# Patient Record
Sex: Female | Born: 1935 | Race: White | Hispanic: No | Smoking: Never smoker
Health system: Southern US, Community
[De-identification: ages and names within clinical notes are randomized; demographics above are authoritative.]

## PROBLEM LIST (undated history)

## (undated) DIAGNOSIS — G629 Polyneuropathy, unspecified: Secondary | ICD-10-CM

## (undated) DIAGNOSIS — R42 Dizziness and giddiness: Secondary | ICD-10-CM

## (undated) DIAGNOSIS — F32A Depression, unspecified: Secondary | ICD-10-CM

## (undated) DIAGNOSIS — E119 Type 2 diabetes mellitus without complications: Secondary | ICD-10-CM

## (undated) DIAGNOSIS — S72009A Fracture of unspecified part of neck of unspecified femur, initial encounter for closed fracture: Secondary | ICD-10-CM

## (undated) DIAGNOSIS — I10 Essential (primary) hypertension: Secondary | ICD-10-CM

## (undated) DIAGNOSIS — E785 Hyperlipidemia, unspecified: Secondary | ICD-10-CM

## (undated) DIAGNOSIS — J449 Chronic obstructive pulmonary disease, unspecified: Secondary | ICD-10-CM

## (undated) DIAGNOSIS — F329 Major depressive disorder, single episode, unspecified: Secondary | ICD-10-CM

## (undated) DIAGNOSIS — I509 Heart failure, unspecified: Secondary | ICD-10-CM

## (undated) DIAGNOSIS — K219 Gastro-esophageal reflux disease without esophagitis: Secondary | ICD-10-CM

## (undated) DIAGNOSIS — F419 Anxiety disorder, unspecified: Secondary | ICD-10-CM

## (undated) DIAGNOSIS — F0390 Unspecified dementia without behavioral disturbance: Secondary | ICD-10-CM

## (undated) HISTORY — DX: Hyperlipidemia, unspecified: E78.5

## (undated) HISTORY — DX: Depression, unspecified: F32.A

## (undated) HISTORY — DX: Fracture of unspecified part of neck of unspecified femur, initial encounter for closed fracture: S72.009A

## (undated) HISTORY — PX: ABDOMINAL HYSTERECTOMY: SHX81

## (undated) HISTORY — DX: Anxiety disorder, unspecified: F41.9

## (undated) HISTORY — DX: Essential (primary) hypertension: I10

## (undated) HISTORY — DX: Polyneuropathy, unspecified: G62.9

## (undated) HISTORY — PX: APPENDECTOMY: SHX54

## (undated) HISTORY — DX: Dizziness and giddiness: R42

## (undated) HISTORY — DX: Gastro-esophageal reflux disease without esophagitis: K21.9

## (undated) HISTORY — DX: Heart failure, unspecified: I50.9

## (undated) HISTORY — PX: BACK SURGERY: SHX140

## (undated) HISTORY — DX: Major depressive disorder, single episode, unspecified: F32.9

## (undated) HISTORY — PX: CHOLECYSTECTOMY: SHX55

---

## 2005-08-19 ENCOUNTER — Ambulatory Visit: Payer: Self-pay

## 2005-08-31 ENCOUNTER — Ambulatory Visit: Payer: Self-pay

## 2006-10-28 ENCOUNTER — Ambulatory Visit: Payer: Self-pay

## 2007-08-08 ENCOUNTER — Emergency Department: Payer: Self-pay | Admitting: Emergency Medicine

## 2007-08-16 ENCOUNTER — Emergency Department: Payer: Self-pay | Admitting: Emergency Medicine

## 2008-02-25 ENCOUNTER — Emergency Department: Payer: Self-pay | Admitting: Emergency Medicine

## 2008-02-25 ENCOUNTER — Other Ambulatory Visit: Payer: Self-pay

## 2008-02-26 ENCOUNTER — Emergency Department: Payer: Self-pay | Admitting: Emergency Medicine

## 2010-09-30 ENCOUNTER — Encounter: Admission: RE | Admit: 2010-09-30 | Discharge: 2010-09-30 | Payer: Self-pay | Admitting: Family Medicine

## 2010-10-13 ENCOUNTER — Ambulatory Visit (HOSPITAL_COMMUNITY)
Admission: RE | Admit: 2010-10-13 | Discharge: 2010-10-13 | Payer: Self-pay | Source: Home / Self Care | Admitting: Family Medicine

## 2010-10-27 ENCOUNTER — Encounter
Admission: RE | Admit: 2010-10-27 | Discharge: 2010-10-27 | Payer: Self-pay | Source: Home / Self Care | Attending: Family Medicine | Admitting: Family Medicine

## 2010-11-18 ENCOUNTER — Encounter
Admission: RE | Admit: 2010-11-18 | Discharge: 2010-11-18 | Payer: Self-pay | Source: Home / Self Care | Attending: Family Medicine | Admitting: Family Medicine

## 2011-07-06 ENCOUNTER — Telehealth: Payer: Self-pay | Admitting: Family Medicine

## 2011-07-21 NOTE — Telephone Encounter (Signed)
Error

## 2012-02-24 ENCOUNTER — Other Ambulatory Visit: Payer: Self-pay | Admitting: Family Medicine

## 2012-02-24 ENCOUNTER — Ambulatory Visit
Admission: RE | Admit: 2012-02-24 | Discharge: 2012-02-24 | Disposition: A | Discharge status: Home / Self Care | Payer: No Typology Code available for payment source | Source: Ambulatory Visit | Attending: Family Medicine | Admitting: Family Medicine

## 2012-02-24 DIAGNOSIS — R0600 Dyspnea, unspecified: Secondary | ICD-10-CM

## 2012-02-24 DIAGNOSIS — R0602 Shortness of breath: Secondary | ICD-10-CM

## 2012-07-15 LAB — URINALYSIS, COMPLETE
Bilirubin,UR: NEGATIVE
Glucose,UR: NEGATIVE mg/dL (ref 0–75)
Hyaline Cast: 9
Ketone: NEGATIVE
Nitrite: NEGATIVE
Ph: 5 (ref 4.5–8.0)
Specific Gravity: 1.028 (ref 1.003–1.030)
Squamous Epithelial: 2

## 2012-07-15 LAB — COMPREHENSIVE METABOLIC PANEL
Alkaline Phosphatase: 91 U/L (ref 50–136)
Calcium, Total: 9.5 mg/dL (ref 8.5–10.1)
Co2: 29 mmol/L (ref 21–32)
EGFR (Non-African Amer.): >60
Osmolality: 284 (ref 275–301)
SGPT (ALT): 20 U/L (ref 12–78)
Sodium: 141 mmol/L (ref 136–145)

## 2012-07-15 LAB — PRO B NATRIURETIC PEPTIDE: B-Type Natriuretic Peptide: 309 pg/mL (ref 0–450)

## 2012-07-15 LAB — CBC WITH DIFFERENTIAL/PLATELET
Eosinophil #: 0.3 10*3/uL (ref 0.0–0.7)
Eosinophil %: 2.9 %
HCT: 37 % (ref 35.0–47.0)
HGB: 12 g/dL (ref 12.0–16.0)
MCHC: 32.4 g/dL (ref 32.0–36.0)
Monocyte #: 0.8 x10 3/mm (ref 0.2–0.9)
Neutrophil %: 58 %
RBC: 4.84 10*6/uL (ref 3.80–5.20)
WBC: 10.4 10*3/uL (ref 3.6–11.0)

## 2012-07-15 LAB — TROPONIN I: Troponin-I: <0.02 ng/mL

## 2012-07-16 ENCOUNTER — Inpatient Hospital Stay: Payer: Self-pay | Admitting: Internal Medicine

## 2012-07-17 LAB — BASIC METABOLIC PANEL
Anion Gap: 5 — ABNORMAL LOW (ref 7–16)
Calcium, Total: 8.8 mg/dL (ref 8.5–10.1)
Co2: 26 mmol/L (ref 21–32)
Creatinine: 0.75 mg/dL (ref 0.60–1.30)
EGFR (African American): >60
EGFR (Non-African Amer.): >60
Potassium: 4.3 mmol/L (ref 3.5–5.1)
Sodium: 141 mmol/L (ref 136–145)

## 2012-07-17 LAB — MAGNESIUM: Magnesium: 1.7 mg/dL — ABNORMAL LOW

## 2012-07-17 LAB — URINE CULTURE

## 2012-07-18 LAB — STOOL CULTURE

## 2012-07-30 ENCOUNTER — Emergency Department: Payer: Self-pay | Admitting: Emergency Medicine

## 2012-07-31 LAB — COMPREHENSIVE METABOLIC PANEL
Alkaline Phosphatase: 95 U/L (ref 50–136)
BUN: 8 mg/dL (ref 7–18)
Bilirubin,Total: 0.3 mg/dL (ref 0.2–1.0)
Calcium, Total: 9.5 mg/dL (ref 8.5–10.1)
Chloride: 102 mmol/L (ref 98–107)
Co2: 31 mmol/L (ref 21–32)
EGFR (African American): >60
EGFR (Non-African Amer.): >60
Glucose: 148 mg/dL — ABNORMAL HIGH (ref 65–99)
SGOT(AST): 29 U/L (ref 15–37)
SGPT (ALT): 18 U/L (ref 12–78)

## 2012-07-31 LAB — CBC WITH DIFFERENTIAL/PLATELET
Comment - H1-Com1: NORMAL
Comment - H1-Com2: NORMAL
Eosinophil: 4 %
HCT: 38.1 %
HGB: 12.3 g/dL
Lymphocytes: 35 %
MCH: 24.7 pg — ABNORMAL LOW
MCHC: 32.4 g/dL
MCV: 76 fL — ABNORMAL LOW
Monocytes: 7 %
Platelet: 312 x10 3/mm 3
RBC: 4.99 X10 6/mm 3
RDW: 15.2 % — ABNORMAL HIGH
Segmented Neutrophils: 51 %
Variant Lymphocyte - H1-Rlymph: 3 %
WBC: 8.4 x10 3/mm 3

## 2012-07-31 LAB — URINALYSIS, COMPLETE
Bilirubin,UR: NEGATIVE
Ketone: NEGATIVE
Ph: 5 (ref 4.5–8.0)
Protein: 30
Specific Gravity: 1.025 (ref 1.003–1.030)
Squamous Epithelial: 3
WBC UR: 39 /HPF (ref 0–5)

## 2012-07-31 LAB — MAGNESIUM: Magnesium: 1.8 mg/dL

## 2012-07-31 LAB — PHOSPHORUS: Phosphorus: 2.9 mg/dL

## 2012-08-01 LAB — URINE CULTURE

## 2013-06-19 ENCOUNTER — Emergency Department: Payer: Self-pay | Admitting: Emergency Medicine

## 2013-06-19 LAB — CBC
HGB: 12.3 g/dL (ref 12.0–16.0)
MCH: 23.8 pg — ABNORMAL LOW (ref 26.0–34.0)
MCHC: 33 g/dL (ref 32.0–36.0)
RDW: 17.3 % — ABNORMAL HIGH (ref 11.5–14.5)

## 2013-06-19 LAB — URINALYSIS, COMPLETE
Bilirubin,UR: NEGATIVE
Glucose,UR: NEGATIVE mg/dL (ref 0–75)
Hyaline Cast: 81
Hyaline Cast: 3
Nitrite: NEGATIVE
Ph: 5 (ref 4.5–8.0)
Ph: 5 (ref 4.5–8.0)
Protein: 100
RBC,UR: 125 /HPF (ref 0–5)
Specific Gravity: 1.029 (ref 1.003–1.030)
Specific Gravity: 1.02 (ref 1.003–1.030)
Squamous Epithelial: 3
WBC UR: <1 /HPF (ref 0–5)

## 2013-06-19 LAB — COMPREHENSIVE METABOLIC PANEL
Bilirubin,Total: 0.5 mg/dL (ref 0.2–1.0)
EGFR (African American): >60
Osmolality: 276 (ref 275–301)
Total Protein: 7.6 g/dL (ref 6.4–8.2)

## 2013-08-11 ENCOUNTER — Emergency Department: Payer: Self-pay | Admitting: Emergency Medicine

## 2013-08-11 LAB — CBC
MCHC: 31.9 g/dL — ABNORMAL LOW (ref 32.0–36.0)
MCV: 74 fL — ABNORMAL LOW (ref 80–100)
Platelet: 340 10*3/uL (ref 150–440)
RBC: 4.77 10*6/uL (ref 3.80–5.20)
WBC: 12 10*3/uL — ABNORMAL HIGH (ref 3.6–11.0)

## 2013-08-11 LAB — URINALYSIS, COMPLETE
Nitrite: NEGATIVE
Ph: 5 (ref 4.5–8.0)
Specific Gravity: 1.024 (ref 1.003–1.030)
Squamous Epithelial: 4
WBC UR: 6 /HPF (ref 0–5)

## 2013-08-11 LAB — COMPREHENSIVE METABOLIC PANEL
Anion Gap: 8 (ref 7–16)
BUN: 30 mg/dL — ABNORMAL HIGH (ref 7–18)
Calcium, Total: 8.7 mg/dL (ref 8.5–10.1)
Chloride: 109 mmol/L — ABNORMAL HIGH (ref 98–107)
Co2: 23 mmol/L (ref 21–32)
Potassium: 3.7 mmol/L (ref 3.5–5.1)
SGOT(AST): 22 U/L (ref 15–37)
Total Protein: 7.4 g/dL (ref 6.4–8.2)

## 2013-08-11 LAB — CK TOTAL AND CKMB (NOT AT ARMC)
CK, Total: 52 U/L (ref 21–215)
CK-MB: 1.5 ng/mL (ref 0.5–3.6)

## 2013-08-12 LAB — URINE CULTURE

## 2013-09-06 IMAGING — CT CT HEAD WITHOUT CONTRAST
1 series · 15 of 30 positions shown, 19 images · non-contrast
Comparison: none

REASON FOR EXAM: CONFUSION
COMMENTS:

[Series 2: soft tissue · axial · 0.43mm/px · z∈[-248,-104]mm · 15 of 33 slices shown, 19 images]
[im 2/33  brain]
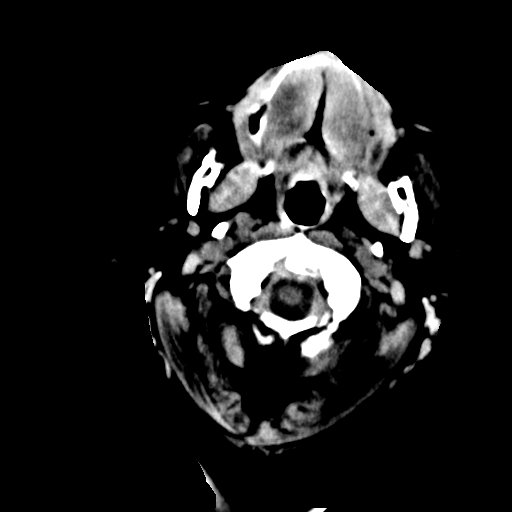
[im 2/33  bone]
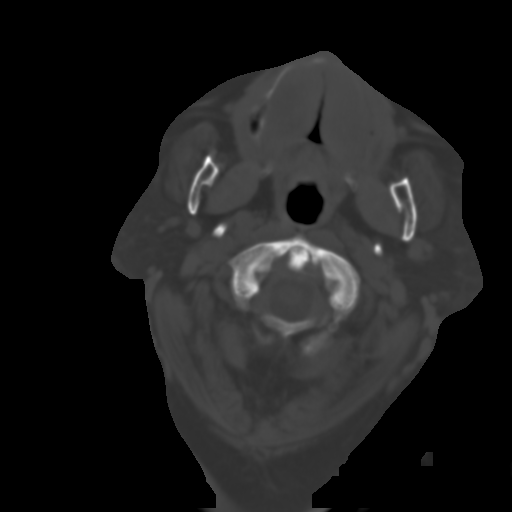
[im 4/33  brain]
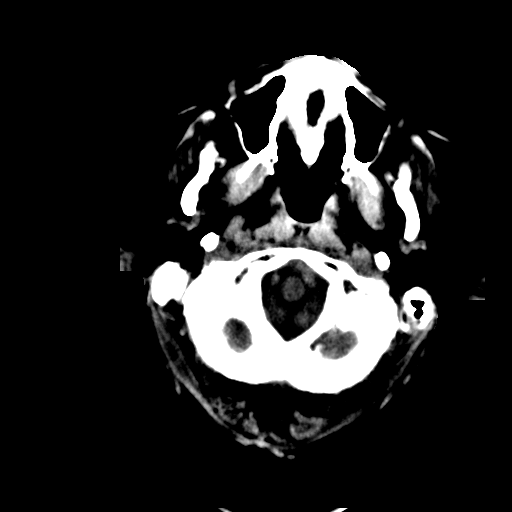
[im 6/33  brain]
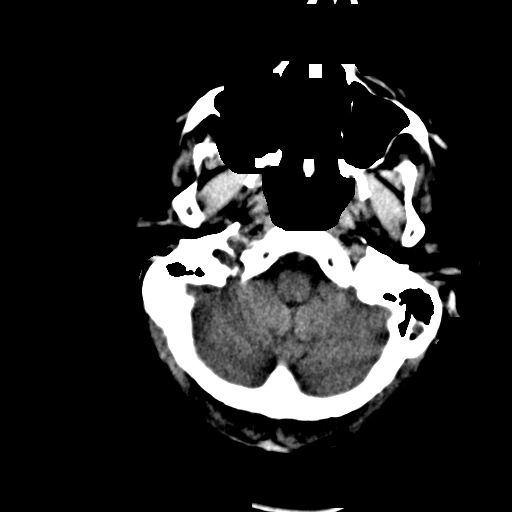
[im 8/33  brain]
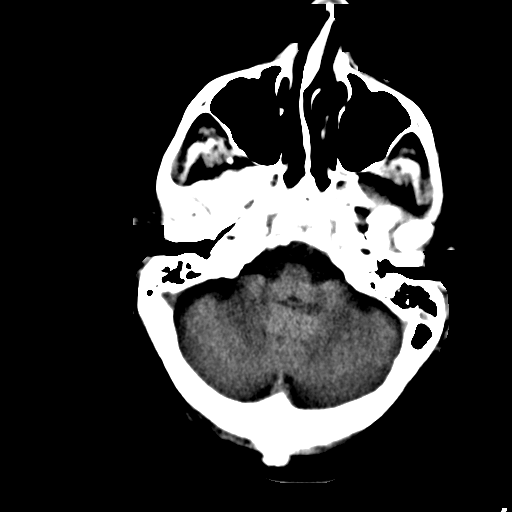
[im 10/33  brain]
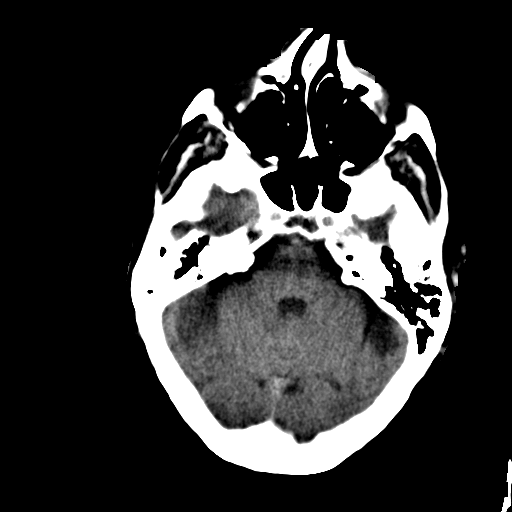
[im 10/33  bone]
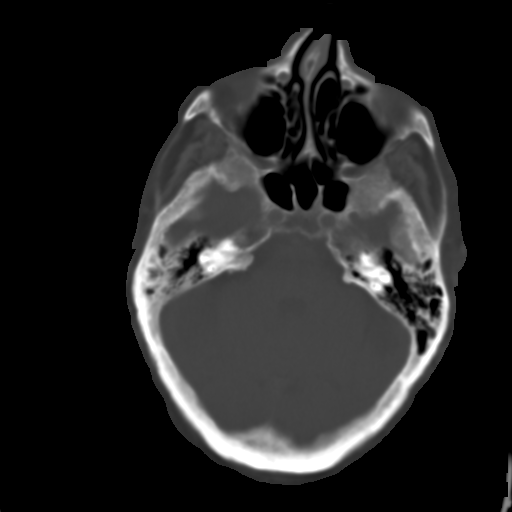
[im 13/33  brain]
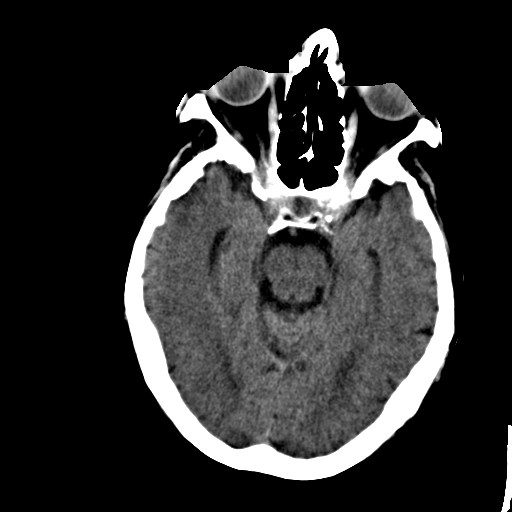
[im 15/33  brain]
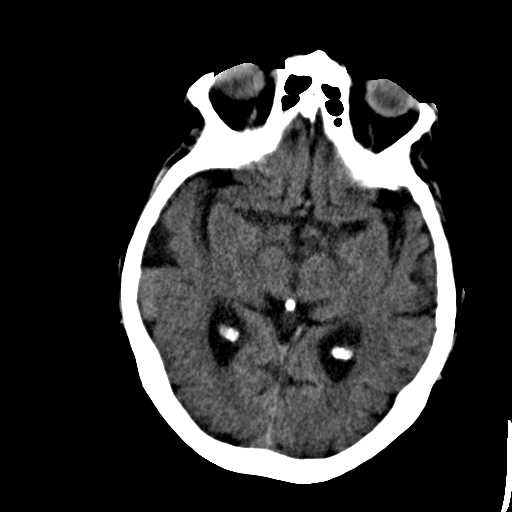
[im 17/33  brain]
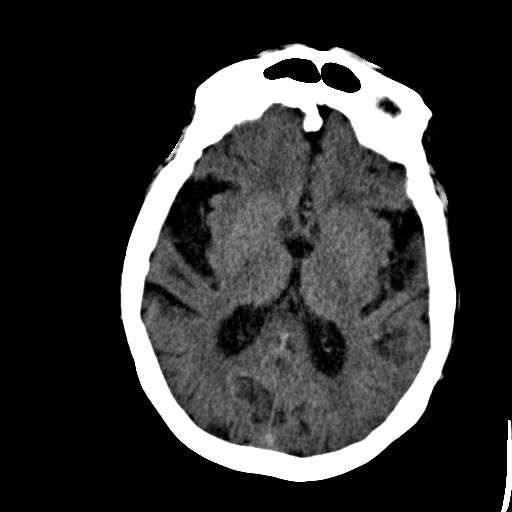
[im 18/33  brain]
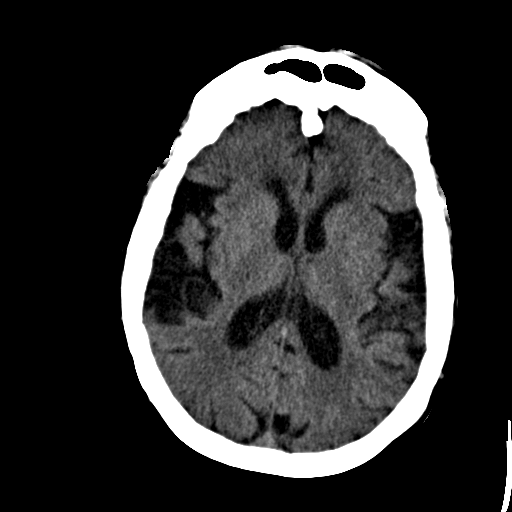
[im 18/33  bone]
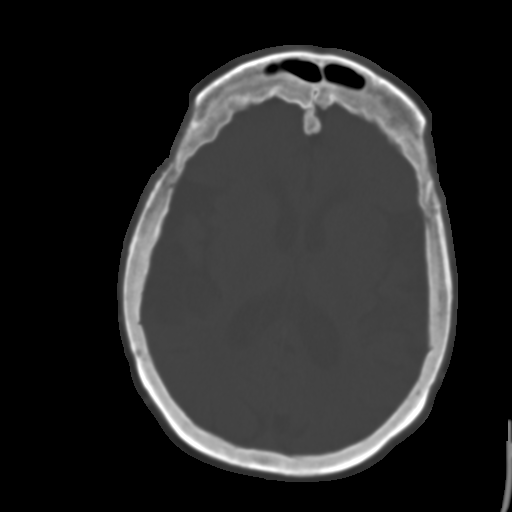
[im 20/33  brain]
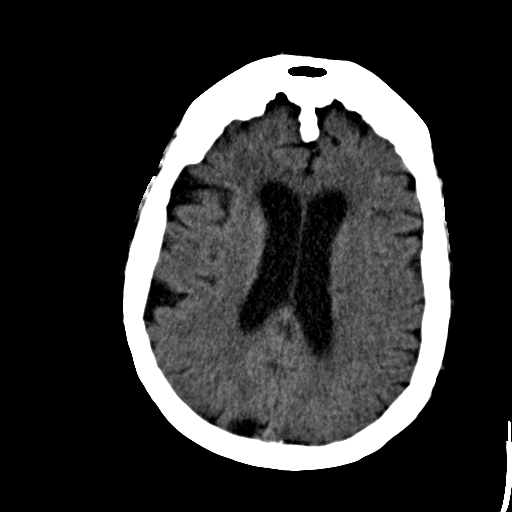
[im 23/33  brain]
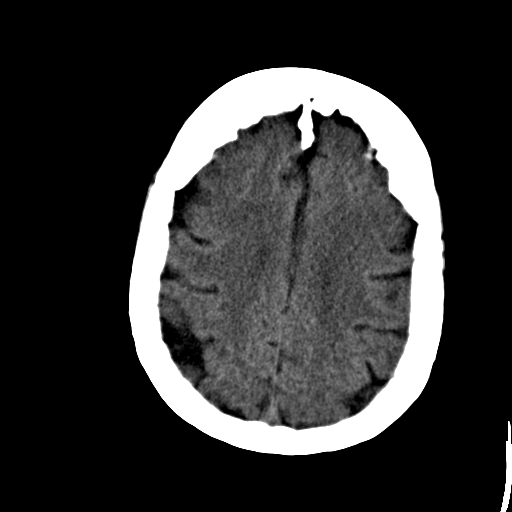
[im 25/33  brain]
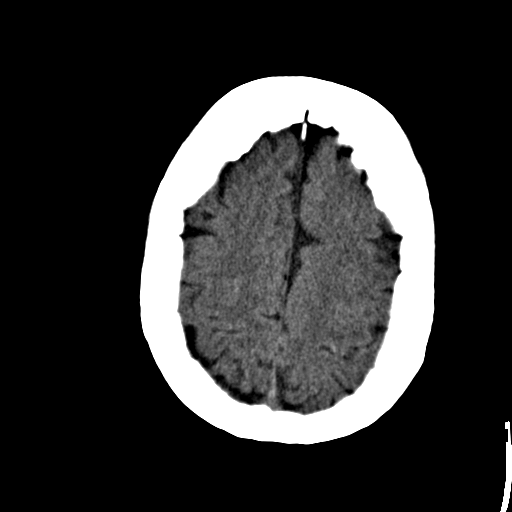
[im 27/33  brain]
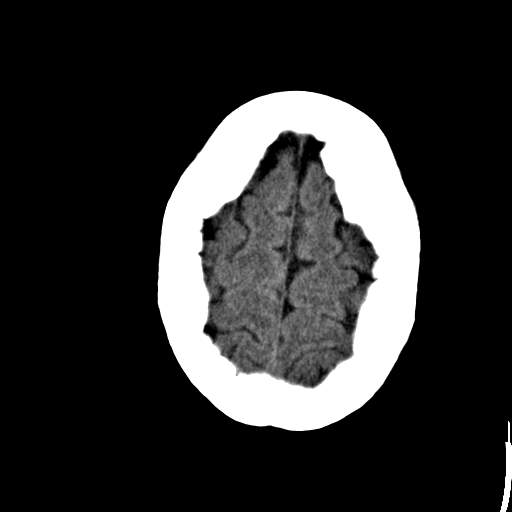
[im 27/33  bone]
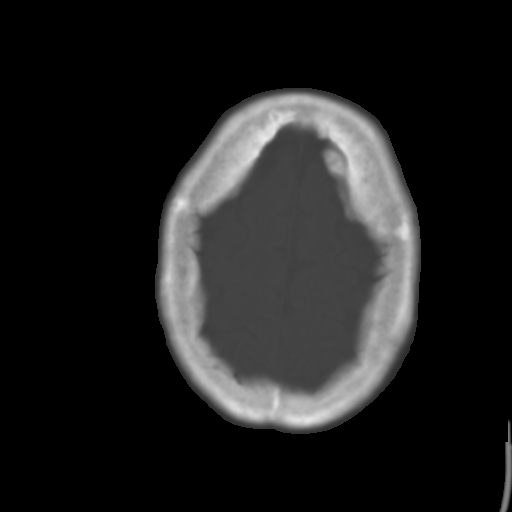
[im 29/33  brain]
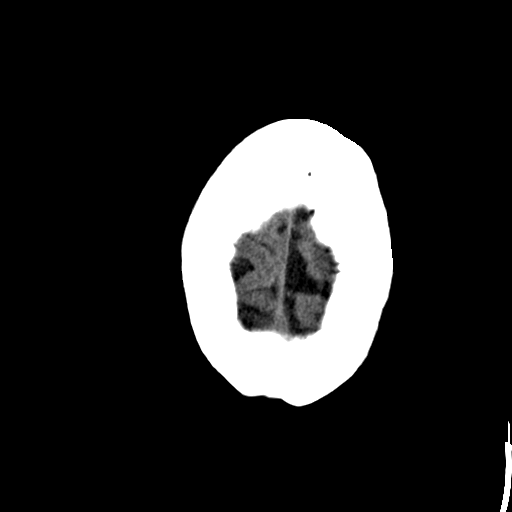
[im 31/33  brain]
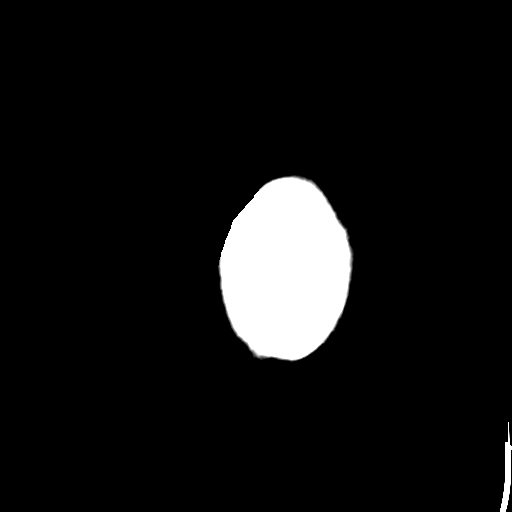

[15 of 30 positions shown; findings below may reference images not displayed]

PROCEDURE:     CT  - CT HEAD WITHOUT CONTRAST  - June 19, 2013  [DATE]

RESULT:     Noncontrast CT of the brain is compared to the previous study of
07/30/2012.

There is prominence of the ventricles and sulci consistent with atrophy.
There is no evidence of intracranial hemorrhage, mass, mass effect or
evolving infarct. There is scattered low-attenuation in the periventricular
and subcortical white matter regions consistent with chronic microvascular
ischemic disease. The included sinuses show grossly normal aeration. The
calvarium shows no depressed skull fracture. There is fluid in the left
mastoid tip and posterior portion of the left mastoid sinus. There is some
sclerosis in the right mastoid region.
IMPRESSION: 1. Fluid in the left mastoid region and sclerosis in the right mastoid
region. This is unchanged compare to the previous study. Changes of atrophy
with chronic microvascular ischemic disease appear present. No acute
intracranial abnormality or significant interval change evident.

[REDACTED]

## 2014-06-17 ENCOUNTER — Observation Stay: Payer: Self-pay | Admitting: Internal Medicine

## 2014-06-17 LAB — COMPREHENSIVE METABOLIC PANEL
ALBUMIN: 3.3 g/dL — AB (ref 3.4–5.0)
ALK PHOS: 109 U/L
ALT: 18 U/L
ANION GAP: 7 (ref 7–16)
BUN: 12 mg/dL (ref 7–18)
Bilirubin,Total: 0.4 mg/dL (ref 0.2–1.0)
CALCIUM: 8.7 mg/dL (ref 8.5–10.1)
CHLORIDE: 109 mmol/L — AB (ref 98–107)
Co2: 26 mmol/L (ref 21–32)
Creatinine: 0.82 mg/dL (ref 0.60–1.30)
EGFR (African American): >60
EGFR (Non-African Amer.): >60
GLUCOSE: 140 mg/dL — AB (ref 65–99)
OSMOLALITY: 285 (ref 275–301)
Potassium: 3.5 mmol/L (ref 3.5–5.1)
SGOT(AST): 14 U/L — ABNORMAL LOW (ref 15–37)
Sodium: 142 mmol/L (ref 136–145)
Total Protein: 7.3 g/dL (ref 6.4–8.2)

## 2014-06-17 LAB — URINALYSIS, COMPLETE
BILIRUBIN, UR: NEGATIVE
Blood: NEGATIVE
Glucose,UR: NEGATIVE mg/dL (ref 0–75)
Leukocyte Esterase: NEGATIVE
NITRITE: NEGATIVE
PH: 5 (ref 4.5–8.0)
SPECIFIC GRAVITY: 1.038 (ref 1.003–1.030)
Squamous Epithelial: 1

## 2014-06-17 LAB — CBC
HCT: 37.1 % (ref 35.0–47.0)
HGB: 11.6 g/dL — ABNORMAL LOW (ref 12.0–16.0)
MCH: 23.8 pg — ABNORMAL LOW (ref 26.0–34.0)
MCHC: 31.3 g/dL — ABNORMAL LOW (ref 32.0–36.0)
MCV: 76 fL — ABNORMAL LOW (ref 80–100)
Platelet: 285 10*3/uL (ref 150–440)
RBC: 4.88 10*6/uL (ref 3.80–5.20)
RDW: 15.2 % — AB (ref 11.5–14.5)
WBC: 9 10*3/uL (ref 3.6–11.0)

## 2014-06-17 LAB — TROPONIN I

## 2014-06-17 LAB — CK TOTAL AND CKMB (NOT AT ARMC)
CK, TOTAL: 142 U/L
CK-MB: 2.5 ng/mL (ref 0.5–3.6)

## 2014-06-17 LAB — PRO B NATRIURETIC PEPTIDE: B-Type Natriuretic Peptide: 655 pg/mL — ABNORMAL HIGH (ref 0–450)

## 2014-06-18 LAB — BASIC METABOLIC PANEL
Anion Gap: 9 (ref 7–16)
BUN: 11 mg/dL (ref 7–18)
CALCIUM: 8.3 mg/dL — AB (ref 8.5–10.1)
CREATININE: 0.74 mg/dL (ref 0.60–1.30)
Chloride: 110 mmol/L — ABNORMAL HIGH (ref 98–107)
Co2: 24 mmol/L (ref 21–32)
EGFR (African American): >60
EGFR (Non-African Amer.): >60
GLUCOSE: 157 mg/dL — AB (ref 65–99)
Osmolality: 288 (ref 275–301)
POTASSIUM: 3.2 mmol/L — AB (ref 3.5–5.1)
SODIUM: 143 mmol/L (ref 136–145)

## 2014-06-18 LAB — CBC WITH DIFFERENTIAL/PLATELET
BASOS ABS: 0 10*3/uL (ref 0.0–0.1)
Basophil %: 0.5 %
EOS PCT: 3.7 %
Eosinophil #: 0.3 10*3/uL (ref 0.0–0.7)
HCT: 36.4 % (ref 35.0–47.0)
HGB: 11.4 g/dL — ABNORMAL LOW (ref 12.0–16.0)
Lymphocyte #: 2.1 10*3/uL (ref 1.0–3.6)
Lymphocyte %: 28.2 %
MCH: 23.7 pg — AB (ref 26.0–34.0)
MCHC: 31.2 g/dL — ABNORMAL LOW (ref 32.0–36.0)
MCV: 76 fL — ABNORMAL LOW (ref 80–100)
Monocyte #: 0.5 x10 3/mm (ref 0.2–0.9)
Monocyte %: 7 %
Neutrophil #: 4.6 10*3/uL (ref 1.4–6.5)
Neutrophil %: 60.6 %
Platelet: 251 10*3/uL (ref 150–440)
RBC: 4.78 10*6/uL (ref 3.80–5.20)
RDW: 15.2 % — ABNORMAL HIGH (ref 11.5–14.5)
WBC: 7.5 10*3/uL (ref 3.6–11.0)

## 2015-01-12 ENCOUNTER — Emergency Department: Payer: Self-pay | Admitting: Emergency Medicine

## 2015-02-25 NOTE — Discharge Summary (Signed)
PATIENT NAME:  [name], [name] MR#:  [id number] DATE OF BIRTH:  12/19/1931  DATE OF ADMISSION:  07/16/2012 DATE OF DISCHARGE:  07/17/2012  PRIMARY CARE PHYSICIAN:  Dr. Vear ClockPhillips.   FINAL DIAGNOSES:  1. Diarrhea, possibly viral.  2. Syncope.  3. Cystitis.  4. Hypokalemia.  5. Hypomagnesemia.  6. Diabetes.  7. Hyperlipidemia.  8. Buttock pain with fall.   MEDICATIONS ON DISCHARGE:  1. Phenergan 25 mg orally as needed for nausea.  2. Tramadol 50 mg every eight hours as needed for pain.  3. Cymbalta 30 mg twice a day. 4. Caduet 5/10, one tablet daily.  5. Metformin 500 mg twice a day. Start after diarrhea stops. 6. Clonazepam 0.5 mg every 12 hours as needed for anxiety.  7. Benazepril 5 mg daily at bedtime.  8. Potassium chloride 10 mEq daily for three more days.  9. Cipro 500 mg 1 tablet every 12 hours for two days.  10. Magnesium oxide 400 mg 1 tablet orally once a day for one week.   DIET: Low sodium diet, regular consistency.   ACTIVITY: Activity as tolerated.   FOLLOW-UP: Dr. Vear ClockPhillips in one week.   REASON FOR ADMISSION: The patient was admitted 07/16/2012, discharged 07/17/2012. The patient came in with two falls and generalized weakness.   HISTORY OF PRESENT ILLNESS: This is a 81 year old female who lives home alone. She fell at home twice. She feels fatigued. She is found to have hypokalemia and positive urinalysis. She complains of buttock pain from the fall. She also complains of diarrhea each time she fell. The patient was admitted for IV fluid hydration, started on Cipro for possible urinary infection.   LABORATORY, DIAGNOSTIC AND RADIOLOGICAL DATA: EKG showed sinus rhythm, frequent premature ventricular complexes. Urine culture actually was negative in 18 to 24 hours, but urinalysis showed 3+ leukocyte esterase, trace bacteria. BNP 309, CPK 77, TSH 1.24. Troponin negative. Glucose 137, BUN 14, creatinine 0.88, sodium 141, potassium 3.1, chloride 105, CO2 29, calcium 9.5.  Liver function tests normal. White blood cell count 10.4, hemoglobin and hematocrit 12.0 and 37.0, platelet count 314. Sacrum and coccyx x-ray: No evidence of sacral fracture. Pelvis x-ray showed no abnormality in the pelvis. Chest x-ray: Stable scarring of the right lung base. CT scan of the head: Moderate diffuse cerebral and cerebellar atrophy with compensatory ventriculomegaly. No acute evidence of abnormality. CT scan of the cervical spine showed no fracture or dislocation. Enlargement of the left thyroid lobe exhibits a multinodular appearance. Stool culture negative. C. difficile negative. White blood cells in the stool negative. Magnesium added by me was 1.3. Repeat magnesium upon discharge 1.7. Repeat potassium upon discharge 4.3.   HOSPITAL COURSE PER PROBLEM LIST:  1. Diarrhea, most likely viral cause. This had subsided during the hospital stay.  2. Syncope, most likely secondary to diarrhea and dehydration. The patient was given IV fluid hydration and electrolytes were replaced.  3. Cystitis. The patient was given Cipro. Even though urine culture was negative, I did continue for a three-day course.  4. For the hypokalemia, this was replaced IV and orally upon discharge given a small dose. I believe this was secondary to the Lasix which the patient was taking as an outpatient.  5. For the hypomagnesemia, magnesium rubs were placed IV twice during the hospitalization and orally and orally upon discharge. If the patient does not go back on the Lasix, then the electrolytes can probably be stopped as outpatient.  6. Diabetes. The patient is on metformin. I told  her to hold this if she has diarrhea.  7. Hyperlipidemia. She is on the Caduet.  8. Buttock pain with falls. She is on Tramadol. The patient also did walk well with physical therapy 350 feet. The patient does have a walker at home and felt safe by physical therapy to go home.    TIME SPENT ON DISCHARGE: 40 minutes.    ____________________________ Herschell Dimes. Renae Gloss, MD rjw:ap D: 07/17/2012 15:50:55 ET T: 07/18/2012 13:34:23 ET JOB#: [id number]  cc: Herschell Dimes. Renae Gloss, MD, <Dictator> Marcine Matar., MD Salley Scarlet MD ELECTRONICALLY SIGNED 07/18/2012 16:28

## 2015-02-25 NOTE — H&P (Signed)
PATIENT NAME:  [name], Genae N MR#:  [id number] DATE OF BIRTH:  1932-01-06  DATE OF ADMISSION:  07/16/2012  PRIMARY CARE PHYSICIAN: Marcine Matarharles W. Phillips Jr., MD   REFERRING PHYSICIAN:  Chiquita LothJade Sung, MD    CHIEF COMPLAINT: Larey SeatFell twice, generalized weakness.   HISTORY OF PRESENT ILLNESS: Ms. Manson PasseyBrown is a 81 year old pleasant Caucasian female who lives at home alone, with history of diabetes mellitus, type 2 and peripheral neuropathy. She stated that while she was at home she fell twice. The first one was brief, and she was able to stand up and walk. The second time she stayed on the floor. She was able to reach the telephone and called her girlfriend, who came to help her. The patient was brought here to the hospital for evaluation. The findings here besides the feeling of fatigue is that she has hypokalemia, and there is evidence of urinary tract infection. Her work-up included CAT scan of the head, CAT scan of the cervical spine, and x-ray of the pelvis and sacrum. The findings were unremarkable. She denies any symptoms that were preceding her fall. She denies any palpitations. No chest pain. No shortness of breath. No headache, although she felt funny in her head. She could not describe that well. No diarrhea. No other gastrointestinal symptoms. No dysuria. She reported some frequency of urination, but she attributed that to the recent use of Lasix started about two weeks ago to treat some peripheral edema.    REVIEW OF SYSTEMS: CONSTITUTIONAL: Denies any fever. No chills, but reports fatigue. EYES: No blurring of vision. No double vision. ENT: No hearing impairment. No sore throat. No dysphagia. CARDIOVASCULAR: No chest pain. No shortness of breath. She had two falls without true loss of consciousness. RESPIRATORY: No cough. No shortness of breath. No hemoptysis. No chest pain. GASTROINTESTINAL: No vomiting, no diarrhea. No abdominal pain. GENITOURINARY: No dysuria, but she has mild frequency of urination.  MUSCULOSKELETAL: No joint pain or swelling. No muscular pain or swelling. INTEGUMENTARY: No skin rash other than a chronic skin lesion on the back area, and this is being evaluated. No skin ulcers. NEUROLOGY: No focal weakness. No seizure activity. No headache. She has only generalized weakness. PSYCHIATRY: No anxiety or depression. Her daughter reported some mental status change earlier; however, this is now not evident, and the patient is acting properly. ENDOCRINE: No polyuria or polydipsia. No heat or cold intolerance  PAST MEDICAL HISTORY:  1. Diabetes mellitus, type 2.  2. Peripheral neuropathy.  3. Hyperlipidemia.  4. Hypertension. 5. Granuloma annulare.  6. Mild anxiety.   PAST SURGICAL HISTORY:  1. 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  2. Appendectomy.  3. Cholecystectomy. 4. Cesarean section. 5. Hysterectomy.   FAMILY HISTORY: Her mother suffered from diabetes mellitus. Her father died at an old age. Otherwise, he was healthy. A sister has diabetes mellitus.   SOCIAL HISTORY/HABITS: Nonsmoker. No history of alcohol or drug abuse. She is a widow and lives at home alone.   ADMISSION MEDICATIONS:  1. Metformin 500 mg b.i.d. 2. Phenergan 25 mg p.r.n.  3. Dapsone 25 mg t.i.d.  4. Cymbalta 30 mg b.i.d. 5. Clonazepam, dose was not specified, takes that b.i.d. p.r.n.  6. Caduet 5/10 once a day.  7. Aricept 5 mg once a day.  8. Amitriptyline 25 mg once at bedtime.   ALLERGIES: Codeine causing GI side effect. Allergic to sulfa, unclear type of reaction.   PHYSICAL EXAMINATION:  VITAL SIGNS:  Blood pressure 168/54, respiratory rate 20, pulse 98, temperature 98.3, oxygen saturation 98%.   GENERAL APPEARANCE: Elderly female lying in bed in no acute distress.   HEENT: Head: No pallor. No icterus. No cyanosis. Ears, nose and throat: Hearing was normal. Nasal mucosa, lips, tongue were normal.  Eyes: Examination revealed normal eyelids and  conjunctivae. Pupils were about 4 mm.  I could not assess reactivity to light.   NECK: Supple. Trachea at midline. No thyromegaly. No cervical masses.   HEART: Normal S1, S2. No S3, S4. No murmur. No gallop. No carotid bruits.   RESPIRATORY: Normal breathing pattern without use of accessory muscles. No rales. No wheezing.   ABDOMEN: Obese, soft without tenderness. No hepatosplenomegaly. No masses. No hernias.   SKIN: No ulcers. No subcutaneous nodules. There are her chronic-like skin changes on the upper back area in the form of papules.   MUSCULOSKELETAL: No joint swelling. No clubbing.   NEUROLOGIC: Cranial nerves II through XII are intact. No focal motor deficit.   PSYCHIATRIC: The patient is alert and oriented x3. Mood and affect were normal.   LABORATORY, DIAGNOSTIC AND RADIOLOGICAL DATA: Serum glucose 137. B-type natriuretic peptide 309, BUN 14, creatinine 0.8, sodium 141, potassium 3.1. Normal liver function tests. Calcium was 9.5. CPK 77. Troponin less than 0.02. TSH was 1.24. CBC showed white count of 10,000, hemoglobin 12, hematocrit 37, platelet count 314. Urinalysis showed cloudy urine, +3 leukocyte esterase, 96 white blood cells. No RBCs. CAT scan of the head showed no acute intracranial abnormalities. CT scan of the cervical spine showed no fracture or subluxation. There are some degenerative changes. There is also evidence of goiter. EKG showed normal sinus rhythm at rate of 82 per minute with frequent ventricular premature complexes. Poor progression of R waves, otherwise unremarkable EKG.   ASSESSMENT:  1. Fatigue.  2. Near syncope x2. 3. Hypokalemia.  4. Urinary tract infection.  5. Peripheral neuropathy.  6. Diabetes mellitus, type 2.  7. Systemic hypertension.  8. Hyperlipidemia.  9. Anxiety disorder.  10. History of cholecystectomy.  11. History of appendectomy. 12. History of hysterectomy.   CODE STATUS:  DO NOT RESUSCITATE.     PLAN:  1. The patient will be  admitted to the hospital.  2. She will be placed on gentle IV hydration along with potassium replacement. Follow-up on her basic metabolic profile tomorrow.  3. Treatment of her urinary tract infection with ciprofloxacin.  4. Place her on Accu-Cheks and sliding scale and continue the home medications as listed above.   The patient indicates that she has a LIVING WILL, and she wants her CODE STATUS to be DO NOT RESUSCITATE. She does not want any cardiopulmonary resuscitation or cardiac cardioversion, or intubation, or any kind of life support even for short term. The patient understands that there are reversible conditions, but she insists on her decision to be DO NOT RESUSCITATE.   TIME SPENT: Time spent evaluating this patient took more than 55 minutes.   ____________________________ Carney Corners. Rudene Re, MD amd:cbb D: 07/16/2012 02:29:15 ET T: 07/16/2012 10:07:56 ET JOB#: [id number]  cc: Carney Corners. Rudene Re, MD, <Dictator> Marcine Matar., MD Karolee Ohs Dala Dock MD ELECTRONICALLY SIGNED 07/17/2012 0:33

## 2015-03-01 NOTE — H&P (Signed)
PATIENT NAME:  [name], [name] MR#:  [id number] DATE OF BIRTH:  May 01, 1932  DATE OF ADMISSION:  06/17/2014  PRIMARY CARE PHYSICIAN:  Marcine Matarharles W. Phillips Jr., MD   CHIEF COMPLAINT: Fall.   HISTORY OF PRESENT ILLNESS: This is a 81 year old female who had a fall on Saturday afternoon and could not get up. She has been crawling around on the floor. She could not reach the phone. She had to go to the bathroom on the floor. She has had terrible diarrhea. She has been in the ER for over 7 hours. A CT scan was negative of the hip. Her hip pain actually got better, but when they tried to stand her up she did get vertigo and the room spinning and was not feeling well. She also states that she has had a little cold. Hospitalist services were contacted for further evaluation.   PAST MEDICAL HISTORY: Diabetes, neuropathy, itching, and depression.   PAST SURGICAL HISTORY: Spine surgery, tumor on the back, C-section, hysterectomy, cataracts.   ALLERGIES: CODEINE AND SULFA LISTED IN THE COMPUTER.   MEDICATIONS: Amitriptyline 25 mg at bedtime, Celexa 10 mg daily, clorazepate 3.75 mg 1 tablet twice a day, hydroxyzine hydrochloride 25 mg every 6 hours as needed for itching, meclizine 25 mg 3 times a day as needed for dizziness, omeprazole 20 mg twice a day, oxybutynin 10 mg extended release 1 tablet daily, tramadol 50 mg every 4 hours as needed for pain.   SOCIAL HISTORY: No smoking. No alcohol. No drug use. Lives alone. 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   FAMILY HISTORY: Father died at 1100 at old age. Mother had diabetes and died of complications.    REVIEW OF SYSTEMS:  CONSTITUTIONAL: Positive for fever. No chills or sweats. Positive for weight loss. Positive for fatigue.  EYES: No blurry vision.  EARS, NOSE, MOUTH, AND THROAT: Positive for runny nose, sinus congestion.  CARDIOVASCULAR: No chest pain. No palpitations.  RESPIRATORY: Positive for shortness of breath. No cough. No sputum.  No hemoptysis.  GASTROINTESTINAL: No nausea. No vomiting. Negative for abdominal pain. Positive for diarrhea. No bright red blood per rectum. No melena.  GENITOURINARY: No burning on urination or hematuria.  MUSCULOSKELETAL: Positive for right hip pain.  INTEGUMENTARY: Positive for itching and rash.  PSYCHIATRIC: Positive for depression.  ENDOCRINE: No thyroid problems.  HEMATOLOGIC AND LYMPHATIC:  No anemia. No easy bruising or bleeding.   PHYSICAL EXAMINATION: VITAL SIGNS: On presentation to the ER include a temperature 98.4, pulse 83, respirations 16, blood pressure 149/83, pulse oximetry 97% on room air.  GENERAL: No respiratory distress.  EYES: Conjunctivae and lids normal. Pupils equal, round, and reactive to light. Extraocular muscles intact. No nystagmus.  EARS, NOSE, MOUTH, AND THROAT: Tympanic membranes: No erythema. Nasal mucosa: No erythema. Throat: No erythema, no exudate seen.  Lips and gums: No lesions. NECK: No JVD. No bruits. No lymphadenopathy. No thyromegaly. No thyroid nodules palpated.  RESPIRATORY:  Lungs clear to auscultation. No use of accessory muscles to breathe. No rhonchi, rales, or wheeze heard.  CARDIOVASCULAR SYSTEM: S1, S2 normal. No gallops, rubs, or murmurs heard. Carotid upstroke 2+ bilaterally. No bruits.  EXTREMITIES: Dorsalis pedis pulses 1+ bilaterally. Trace edema of the lower extremity.  ABDOMEN: Soft. Positive tenderness in the epigastric area. No organomegaly, splenomegaly. Normoactive bowel sounds. No masses felt.  LYMPHATICS: No lymph nodes in the neck.  MUSCULOSKELETAL: Trace edema. No clubbing. No cyanosis.  SKIN: No ulcers or lesions seen. Secondary excoriations seen on  the legs.  NEUROLOGIC: Cranial nerves II through XII grossly intact. Deep tendon reflexes 1+ bilateral lower extremity. The patient able to straight leg raise bilaterally.  PSYCHIATRIC: The patient is alert, oriented to person, place, and time.   LABORATORY AND RADIOLOGICAL  DATA:  Chest x-ray, no acute abnormality. CT scan of the head, no acute intracranial abnormality. Cerebral atrophy and small vessel ischemic changes, chronic left-sided partial mastoid opacification.    Troponin negative. Glucose 140, BUN 12, creatinine 0.82, sodium 142, potassium 3.5, chloride 109, CO2 of 26, calcium 8.7. Liver function tests normal range. White blood cell count 9.0, H and H, 11.6 and 37.1, platelet count of 285,000. BNP 655.  Urinalysis, trace ketones.   Right hip x-ray, no acute abnormality.  CT scan of the right hip, no acute finding.   EKG, normal sinus rhythm, 82 beats per minute, left axis deviation, septal infarct V1, V2.   ASSESSMENT AND PLAN: 1.  Vertigo, unable to stand up. Will get physical therapy evaluation tomorrow. Intravenous fluid hydration overnight, standing dose meclizine today.  2.  Fall, right hip pain. Crawling on the floor for a few days. We will get physical therapy evaluation, care manager evaluation.  3.  Diabetes, not on any medications for this. We will watch fingersticks at this point.  4.  Neuropathy.  5.  Anxiety, depression, on numerous medications.  6.  Diarrhea. We will send off stool studies if the patient has further diarrhea here.  Time spent on admission, 55 minutes.    The patient is a DO NOT RESUSCITATE.    ____________________________ Herschell Dimes. Renae Gloss, MD rjw:lb D: 06/17/2014 19:43:00 ET T: 06/17/2014 20:45:14 ET JOB#: [id number]  cc: Herschell Dimes. Renae Gloss, MD, <Dictator> Marcine Matar., MD Salley Scarlet MD ELECTRONICALLY SIGNED 06/18/2014 19:59

## 2015-03-01 NOTE — Discharge Summary (Signed)
PATIENT NAME:  [name], [name] DATE OF BIRTH:  Apr 15, 1932  DATE OF ADMISSION:  06/17/2014 DATE OF DISCHARGE:  06/18/2014  DISCHARGE DIAGNOSES:  1. Positional vertigo causing dizziness.  2. Left hip pain status post fall with no fractures.  3. History of gastroesophageal reflux disease.  4. Anxiety.  5. Depression.   DISCHARGE MEDICATIONS:  1. Hydroxyzine hydrochloride 25 mg every 6 hours as needed.  2. Clorazepic 3.75 mg p.o. b.i.d.  3. Amitriptyline 25 mg p.o. daily.  4. Meclizine 20 mg p.o. t.i.d.  5. Oxybutynin 10 mg p.o. daily.  6. Omeprazole 20 mg p.o. b.i.d.  7. Celexa 10 mg daily.  8. Tramadol 50 mg q. 4 hours.   CONSULTATIONS: None.   HOSPITAL COURSE: The patient is a [age] year old female who came in because of history of fall and unable to ambulate with hip pain and also positional vertigo. The patient's labs were normal.  Her CBC and WBC were normal Her CBC, chem-7 was normal. Her CT head was normal. The patient did not have any orthostatic changes. She was placed in observation for dizziness, fall and positional vertigo. The patient was seen by physical therapy they recommended home PT. The patient felt much better the following day and hip pain improved. The patient discharged home on physical therapy. The patient has a quad cane and rolling walker to aide in ambulation. The patient seen by physical therapy and they recommended home health. The patient is discharged home with home physical therapy. The patient has a walker at home. The patient discharged home with home health. The patient advised to continue medication that she was taking before and we can use meclizine p.r.n. for dizziness. The patient's vitals,  temperature 98, heart rate 84, blood pressure 115/70 and saturations 100% on room air. The patient's primary doctor, Dr. Doylene [name].     TIME SPENT: More than 30 minutes.    ____________________________ Jessica HammingSnehalatha Ramal Eckhardt, MD sk:JT D: 06/19/2014 23:44:09  ET T: 06/20/2014 04:47:01 ET JOB#: [id number]  cc: Jessica HammingSnehalatha Ott Zimmerle, MD, <Dictator> Jessica HammingSNEHALATHA Delorise Hunkele MD ELECTRONICALLY SIGNED 06/25/2014 23:13

## 2015-03-27 ENCOUNTER — Ambulatory Visit: Payer: Self-pay | Admitting: Gastroenterology

## 2015-03-31 ENCOUNTER — Encounter: Payer: Self-pay | Admitting: Gastroenterology

## 2015-03-31 ENCOUNTER — Other Ambulatory Visit (INDEPENDENT_AMBULATORY_CARE_PROVIDER_SITE_OTHER): Payer: Medicare HMO

## 2015-03-31 ENCOUNTER — Other Ambulatory Visit: Payer: Medicare HMO

## 2015-03-31 ENCOUNTER — Ambulatory Visit (INDEPENDENT_AMBULATORY_CARE_PROVIDER_SITE_OTHER): Payer: Medicare HMO | Admitting: Gastroenterology

## 2015-03-31 VITALS — BP 126/78 | HR 72 | Ht 65.0 in | Wt 193.8 lb

## 2015-03-31 DIAGNOSIS — D509 Iron deficiency anemia, unspecified: Secondary | ICD-10-CM | POA: Diagnosis not present

## 2015-03-31 DIAGNOSIS — R112 Nausea with vomiting, unspecified: Secondary | ICD-10-CM

## 2015-03-31 LAB — CBC WITH DIFFERENTIAL/PLATELET
BASOS ABS: 0 10*3/uL (ref 0.0–0.1)
Basophils Relative: 0.3 % (ref 0.0–3.0)
Eosinophils Absolute: 0.4 10*3/uL (ref 0.0–0.7)
Eosinophils Relative: 3.3 % (ref 0.0–5.0)
HCT: 37.9 % (ref 36.0–46.0)
Hemoglobin: 12.3 g/dL (ref 12.0–15.0)
LYMPHS PCT: 19.3 % (ref 12.0–46.0)
Lymphs Abs: 2.4 10*3/uL (ref 0.7–4.0)
MCHC: 32.5 g/dL (ref 30.0–36.0)
MCV: 73.8 fl — ABNORMAL LOW (ref 78.0–100.0)
Monocytes Absolute: 0.8 10*3/uL (ref 0.1–1.0)
Monocytes Relative: 6.5 % (ref 3.0–12.0)
NEUTROS ABS: 8.8 10*3/uL — AB (ref 1.4–7.7)
Neutrophils Relative %: 70.6 % (ref 43.0–77.0)
Platelets: 335 10*3/uL (ref 150.0–400.0)
RBC: 5.14 Mil/uL — ABNORMAL HIGH (ref 3.87–5.11)
RDW: 15.3 % (ref 11.5–15.5)
WBC: 12.4 10*3/uL — AB (ref 4.0–10.5)

## 2015-03-31 LAB — COMPREHENSIVE METABOLIC PANEL
ALBUMIN: 3.8 g/dL (ref 3.5–5.2)
ALK PHOS: 85 U/L (ref 39–117)
ALT: 12 U/L (ref 0–35)
AST: 18 U/L (ref 0–37)
BILIRUBIN TOTAL: 0.3 mg/dL (ref 0.2–1.2)
BUN: 17 mg/dL (ref 6–23)
CALCIUM: 9.5 mg/dL (ref 8.4–10.5)
CO2: 27 mEq/L (ref 19–32)
CREATININE: 0.85 mg/dL (ref 0.40–1.20)
Chloride: 103 mEq/L (ref 96–112)
GFR: 68.29 mL/min (ref >60.00)
GLUCOSE: 157 mg/dL — AB (ref 70–99)
Potassium: 4.6 mEq/L (ref 3.5–5.1)
Sodium: 136 mEq/L (ref 135–145)
TOTAL PROTEIN: 7.4 g/dL (ref 6.0–8.3)

## 2015-03-31 NOTE — Progress Notes (Signed)
_                                                                                                                History of Present Illness:  [name] is an 81 year old white female referred at the request of Dr.Koehler evaluation of vomiting.  He has a long history of a "sensitive stomach" and may vomit with something disagrees with her.  Over the past 2 months, however, she's been complaining of spontaneous dry heaves with episodes of nausea and vomiting.  Eating, if anything, assuages the symptoms.  She complains of anorexia. She has lost over 20 pounds.  There is been no change in medications.  For the past year or so she's been taking Aleve up to 5 times a day.  In August, 2015 hemoglobin was 11.6 and MCV 76.  She has frequent loose stools with urgency.  She denies melena or hematochezia.  She has never had a colonoscopy.   Past Medical History  Diagnosis Date  . Depression   . Anxiety   . GERD (gastroesophageal reflux disease)   . Vertigo   . Hip fracture   . Neuropathy   . Hyperlipemia    Past Surgical History  Procedure Laterality Date  . Abdominal hysterectomy    . Appendectomy    . Cholecystectomy     family history includes Diabetes in her mother and sister; Heart disease in her brother and father. Current Outpatient Prescriptions  Medication Sig Dispense Refill  . amitriptyline (ELAVIL) 25 MG tablet Take 25 mg by mouth at bedtime.    . citalopram (CELEXA) 10 MG tablet Take 10 mg by mouth daily.    . hydrOXYzine (ATARAX/VISTARIL) 25 MG tablet Take 25 mg by mouth 3 (three) times daily as needed.    . meclizine (ANTIVERT) 25 MG tablet Take 25 mg by mouth 3 (three) times daily as needed for dizziness.    Marland Kitchen omeprazole (PRILOSEC) 20 MG capsule Take 20 mg by mouth 2 (two) times daily before a meal.    . oxybutynin (DITROPAN-XL) 10 MG 24 hr tablet Take 10 mg by mouth at bedtime.    . traMADol (ULTRAM) 50 MG tablet Take by mouth every 6 (six) hours as needed.      No current facility-administered medications for this visit.   Allergies as of 03/31/2015 - Review Complete 03/31/2015  Allergen Reaction Noted  . Ampicillin  03/31/2015  . Sulfa antibiotics  03/31/2015    reports that she has never smoked. She has never used smokeless tobacco. She reports that she does not drink alcohol or use illicit drugs.   Review of Systems:  takes Aleve for lower abdominal pain.  Pertinent positive and negative review of systems were noted in the above HPI section. All other review of systems were otherwise negative.  Vital signs were reviewed in today's medical record Physical Exam: General:Elderly  female in no acute distress Skin: anicteric Head: Normocephalic and atraumatic Eyes:  sclerae anicteric, EOMI Ears: Normal auditory acuity Mouth: No  deformity or lesions Neck: Supple, no masses or thyromegaly Lymph Nodes: no lymphadenopathy Lungs: Clear throughout to auscultation Heart: Regular rate and rhythm; no murmurs, rubs or bruits Gastroinestinal: Soft, non tender and non distended. No masses, hepatosplenomegaly or hernias noted. Normal Bowel sounds Rectal:deferred Musculoskeletal:Marked kyphosis  Skin: No lesions on visible extremities Pulses:  Normal pulses noted Extremities: No clubbing, cyanosis, edema or deformities noted Neurological: Alert oriented x 4, grossly nonfocal Cervical Nodes:  No significant cervical adenopathy Inguinal Nodes: No significant inguinal adenopathy Psychological:  Alert and cooperative. Normal mood and affect  See Assessment and Plan under Problem List

## 2015-03-31 NOTE — Assessment & Plan Note (Signed)
Suspect this is NSAID-related.  She may have erosions or active peptic disease.  Occult lower GI bleeding is also a consideration.    Recommendations #1 colonoscopy if upper endoscopy is not diagnostic

## 2015-03-31 NOTE — Patient Instructions (Signed)
You have been scheduled for an endoscopy. Please follow written instructions given to you at your visit today. If you use inhalers (even only as needed), please bring them with you on the day of your procedure. Your physician has requested that you go to www.startemmi.com and enter the access code given to you at your visit today. This web site gives a general overview about your procedure. However, you should still follow specific instructions given to you by our office regarding your preparation for the procedure.  Go to the basement for labs today Discontinue taking Aleve We will send in Protonix to your pharmacy

## 2015-03-31 NOTE — Assessment & Plan Note (Signed)
Two-month history of anorexia, nausea and vomiting.  Symptoms could be related to chronic Aleve use (patient does not take tramadol).  Occult malignancy may be considered.  Note patient has had a profound weight loss.  Recommendations #1 CBC and comp he has a metabolic profile #2 upper endoscopy  CC Dr. Dorothe PeaKoehler

## 2015-04-04 ENCOUNTER — Encounter: Payer: Self-pay | Admitting: Gastroenterology

## 2015-04-04 ENCOUNTER — Ambulatory Visit (AMBULATORY_SURGERY_CENTER): Payer: Medicare HMO | Admitting: Gastroenterology

## 2015-04-04 VITALS — BP 165/61 | HR 85 | Temp 98.6°F | Resp 22 | Ht 65.0 in | Wt 193.0 lb

## 2015-04-04 DIAGNOSIS — K222 Esophageal obstruction: Secondary | ICD-10-CM | POA: Diagnosis not present

## 2015-04-04 DIAGNOSIS — K257 Chronic gastric ulcer without hemorrhage or perforation: Secondary | ICD-10-CM | POA: Diagnosis not present

## 2015-04-04 DIAGNOSIS — K2950 Unspecified chronic gastritis without bleeding: Secondary | ICD-10-CM | POA: Diagnosis not present

## 2015-04-04 DIAGNOSIS — R112 Nausea with vomiting, unspecified: Secondary | ICD-10-CM | POA: Diagnosis not present

## 2015-04-04 MED ORDER — SODIUM CHLORIDE 0.9 % IV SOLN: 500.0000 mL | INTRAVENOUS | Status: DC

## 2015-04-04 NOTE — Progress Notes (Signed)
Called to room to assist during endoscopic procedure.  Patient ID and intended procedure confirmed with present staff. Received instructions for my participation in the procedure from the performing physician.  

## 2015-04-04 NOTE — Patient Instructions (Signed)
YOU HAD AN ENDOSCOPIC PROCEDURE TODAY AT THE [facility]:   Refer to the procedure report that was given to you for any specific questions about what was found during the examination.  If the procedure report does not answer your questions, please call your gastroenterologist to clarify.  If you requested that your care partner not be given the details of your procedure findings, then the procedure report has been included in a sealed envelope for you to review at your convenience later.  YOU SHOULD EXPECT: Some feelings of bloating in the abdomen. Passage of more gas than usual.  Walking can help get rid of the air that was put into your GI tract during the procedure and reduce the bloating. If you had a lower endoscopy (such as a colonoscopy or flexible sigmoidoscopy) you may notice spotting of blood in your stool or on the toilet paper. If you underwent a bowel prep for your procedure, you may not have a normal bowel movement for a few days.  Please Note:  You might notice some irritation and congestion in your nose or some drainage.  This is from the oxygen used during your procedure.  There is no need for concern and it should clear up in a day or so.  SYMPTOMS TO REPORT IMMEDIATELY:   Following upper endoscopy (EGD)  Vomiting of blood or coffee ground material  New chest pain or pain under the shoulder blades  Painful or persistently difficult swallowing  New shortness of breath  Fever of 100F or higher  Black, tarry-looking stools  For urgent or emergent issues, a gastroenterologist can be reached at any hour by calling [phone].   DIET: See dilation diet. Likewise, meals heavy in dairy and vegetables can increase bloating.  Drink plenty of fluids but you should avoid alcoholic beverages for 24 hours.  ACTIVITY:  You should plan to take it easy for the rest of today and you should NOT DRIVE or use heavy machinery until tomorrow (because of the sedation medicines used  during the test).    FOLLOW UP: Our staff will call the number listed on your records the next business day following your procedure to check on you and address any questions or concerns that you may have regarding the information given to you following your procedure. If we do not reach you, we will leave a message.  However, if you are feeling well and you are not experiencing any problems, there is no need to return our call.  We will assume that you have returned to your regular daily activities without incident.  If any biopsies were taken you will be contacted by phone or by letter within the next 1-3 weeks.  Please call us at [phone] if you have not heard about the biopsies in 3 weeks.    SIGNATURES/CONFIDENTIALITY: You and/or your care partner have signed paperwork which will be entered into your electronic medical record.  These signatures attest to the fact that that the information above on your After Visit Summary has been reviewed and is understood.  Full responsibility of the confidentiality of this discharge information lies with you and/or your care-partner.  Avoid NSAIDS (ibuprofen, motrin, advil, aleve, mobic, etc)  Dilation, stricure-handout given

## 2015-04-04 NOTE — Op Note (Signed)
[facility] [name]  [address] [place], [id number]   ENDOSCOPY PROCEDURE REPORT  PATIENT: [name], [name]  MR#: [id number] BIRTHDATE: 05-21-1932 , 82  yrs. old GENDER: female ENDOSCOPIST: Louis Meckelobert D Kaplan, MD REFERRED BY:  Marny Lowensteinobert Koehler, M.D. PROCEDURE DATE:  04/04/2015 PROCEDURE:  EGD w/ biopsy and Maloney dilation of esophagus ASA CLASS:     Class II INDICATIONS:  vomiting, nausea, and dyspepsia. MEDICATIONS: Monitored anesthesia care and Propofol 130 mg IV TOPICAL ANESTHETIC:  DESCRIPTION OF PROCEDURE: After the risks benefits and alternatives of the procedure were thoroughly explained, informed consent was obtained.  The LB [id number] endoscope was introduced through the mouth and advanced to the second portion of the duodenum , Without limitations.  The instrument was slowly withdrawn as the mucosa was fully examined.      ESOPHAGUS: There was a peptic stricture at the gastroesophageal junction.  The stricture was traversable.  The stricture was dilated using a 18mm (52Fr) Maloney dilator.  STOMACH: There are multiple superficial ulcers in the gastric antrum including at least 2 linear ulcers and 2 punched out ulcers. Surrounding mucosa was erythematous.  Biopsies were taken.  The respective old blood.  Retroflexed views revealed no abnormalities. The scope was then withdrawn from the patient and the procedure completed.  COMPLICATIONS: There were no immediate complications.  ENDOSCOPIC IMPRESSION: 1.   There was a stricture at the gastroesophageal junction; The stricture was dilated using a 18mm (52Fr) Maloney dilator 2.   There are multiple superficial ulcers in the gastric antrum including at least 2 linear ulcers and 2 punched out ulcers. Surrounding mucosa was erythematous.  Biopsies were taken.  The are specks of old blood  RECOMMENDATIONS: 1.  Avoid NSAIDS 2.  Continue PPI 3.  My office will schedule a colonoscopy  REPEAT  EXAM:  eSigned:  Louis Meckelobert D Kaplan, MD 04/04/2015 2:19 PM    CC:  PATIENT NAME:  [name], [name] MR#: [id number]

## 2015-04-04 NOTE — Progress Notes (Signed)
Report to PACU, RN, vss, BBS= Clear.  

## 2015-04-08 ENCOUNTER — Telehealth: Payer: Self-pay | Admitting: *Deleted

## 2015-04-08 NOTE — Telephone Encounter (Signed)
Number has been disconnected, follow-up  

## 2015-04-10 ENCOUNTER — Telehealth: Payer: Self-pay | Admitting: Gastroenterology

## 2015-04-10 NOTE — Telephone Encounter (Signed)
Call back to patient. No answer. No voicemail.

## 2015-04-11 NOTE — Telephone Encounter (Signed)
No answer. No voicemail. 

## 2015-04-14 NOTE — Telephone Encounter (Signed)
Patient is asking for Protonix and Zofran. Neither are on her medications list. Per her pharmacy, she is on Prilosec. Zofran is from Dr Vear ClockPhillips who is apparently her PCP, and it is too early to refill it. The patient tells me her throat hurts on the right side when she swallows. She is able to swallow soft foods and liquids. It is not clear what she is really taking as far as her medications go. Spoke with the son. He states he tried to help her, but she is "stubborn and set in her ways."

## 2015-04-14 NOTE — Telephone Encounter (Signed)
Son states he is taking her to the PCP tomorrow. Encouraged to get the patient to take the medications with her that she is actively taking to discuss her needs with the doctor.

## 2015-04-15 ENCOUNTER — Encounter: Payer: Self-pay | Admitting: Gastroenterology

## 2015-05-01 ENCOUNTER — Ambulatory Visit (INDEPENDENT_AMBULATORY_CARE_PROVIDER_SITE_OTHER): Payer: Medicare HMO | Admitting: Podiatry

## 2015-05-01 ENCOUNTER — Ambulatory Visit: Payer: Self-pay | Admitting: Podiatry

## 2015-05-01 ENCOUNTER — Ambulatory Visit (INDEPENDENT_AMBULATORY_CARE_PROVIDER_SITE_OTHER): Payer: Medicare HMO

## 2015-05-01 VITALS — BP 94/68 | HR 99 | Resp 16 | Ht 65.0 in | Wt 193.0 lb

## 2015-05-01 DIAGNOSIS — R238 Other skin changes: Secondary | ICD-10-CM

## 2015-05-01 DIAGNOSIS — L988 Other specified disorders of the skin and subcutaneous tissue: Secondary | ICD-10-CM

## 2015-05-01 DIAGNOSIS — S90122A Contusion of left lesser toe(s) without damage to nail, initial encounter: Secondary | ICD-10-CM

## 2015-05-01 DIAGNOSIS — L03032 Cellulitis of left toe: Secondary | ICD-10-CM

## 2015-05-01 DIAGNOSIS — S92912A Unspecified fracture of left toe(s), initial encounter for closed fracture: Secondary | ICD-10-CM | POA: Diagnosis not present

## 2015-05-01 DIAGNOSIS — L601 Onycholysis: Secondary | ICD-10-CM | POA: Diagnosis not present

## 2015-05-01 NOTE — Progress Notes (Signed)
Subjective:     Patient ID: [name], female   DOB: [birth year], 81 y.o.   MRN: [id number]  HPI 81 year old female presents the office they with her son for concerns of left big toe pain, infection. She states that last Friday she tripped over something injuring her left big toe. She states that since that she developed a large blister on the top of her toe and the toenail is loose. She states that she has had some redness overlying the toe for which she recently saw her primary care physician was prescribed amoxicillin. She denies any systemic complaints of fevers, chills, nausea, vomiting. No other complaints at this time.  Review of Systems  All other systems reviewed and are negative.       Objective:   Physical Exam AAO x3, NAD DP/PT pulses palpable bilaterally, CRT less than 3 seconds Protective sensatdecreasedSimms Weinstein monofilament, vibratory sensadecreased , Achilles tendon reflex intact To the left hallux there is edema and erythema of the digit and distal aspect of the level to MTPJ. There is no ascending cellulitis. There is a large bulla present on the dorsal aspect of the hallux proximal to the nail. Upon lancing of this area there was serous drainage identified. The toenail is loose and the underlying nail bed and there is subungual hematoma present. There is evidence of dried blood around the toenail as well. There is a small amount of purulent drainage from around the toenail.  No other areas of tenderness to bilateral lower extremities. MMT 5/5, ROM WNL.  No open lesions or pre-ulcerative lesions.  No overlying edema, erythema, increase in warmth to bilateral lower extremities.  No pain with calf compression, swelling, warmth, erythema bilaterally.      Assessment:     81 year old female with left hallux fracture, onycholysis due to trauma, bulla, localized infection     Plan:     -X-rays were obtained and reviewed with the patient.  -Treatment options  discussed including all alternatives, risks, and complications - At this time given the amount of erythema  to the digit with the nail loose from the underlying nail bed and drainage from the toenail I recommended total nail avulsion of the nail. Risks and complications discussed the patient/son. They understand verbally consented to the procedure. Under sterile conditions a total of 3 mL of a one-to-one mixture of 2% lidocaine plain and 0.5% Marcaine plain was infiltrated in a hallux block fashion the left side. Once anesthetized the skin was then prepped in sterile fashion. Next the left hallux toenail was then removed in total. This found the nail was very loose and underlying nail bed and only here on the proximal aspect. There is a small amount of purulent drainage identified in the nail removal. At the nail was removed the area was debrided and no further purulence was expressed. The bulla on the dorsal aspect of the digit was also debrided to underlying healthy skin. Both areas were both irrigated and hemostasis was achieved. Betadine ointment was placed over the area followed by dry sterile dressing. At the inclusion the procedure there is found to be an immediate capillary refill time to the digit. Patient tolerated the procedure well any complications. Post procedure instructions were discussed the patient for which she verbally understood.  -Finish course of antibiotic. Monitor closely for any clinical signs or symptoms is worsening infection and directed to call the office immediately should any occur go dressing to the emergency room.  -Dispensed surgical shoe due to  fracture of the hallux  -Follow-up in 1 week or sooner if any problems are to arise. In the meantime call the office with any questions, concerns, change in symptoms.

## 2015-05-01 NOTE — Patient Instructions (Signed)
Soak Instructions    THE DAY AFTER THE PROCEDURE  Place 1/4 cup of epsom salts in a quart of warm tap water.  Submerge your foot or feet with outer bandage intact for the initial soak; this will allow the bandage to become moist and wet for easy lift off.  Once you remove your bandage, continue to soak in the solution for 20 minutes.  This soak should be done twice a day.  Next, remove your foot or feet from solution, blot dry the affected area and cover.  You may use a band aid large enough to cover the area or use gauze and tape.  Apply other medications to the area as directed by the doctor such as polysporin neosporin.  Make sure you check the temperature of the water with your hand before putting your foot in.    IF YOUR SKIN BECOMES IRRITATED WHILE USING THESE INSTRUCTIONS, IT IS OKAY TO SWITCH TO  WHITE VINEGAR AND WATER. Or you may use antibacterial soap and water to keep the toe clean  Monitor for any signs/symptoms of infection. Call the office immediately if any occur or go directly to the emergency room. Call with any questions/concerns.   Finish course of antibiotics

## 2015-05-08 ENCOUNTER — Ambulatory Visit (INDEPENDENT_AMBULATORY_CARE_PROVIDER_SITE_OTHER): Payer: Medicare HMO | Admitting: Podiatry

## 2015-05-08 ENCOUNTER — Encounter: Payer: Self-pay | Admitting: Podiatry

## 2015-05-08 VITALS — BP 161/85 | HR 83 | Resp 18

## 2015-05-08 DIAGNOSIS — S92912A Unspecified fracture of left toe(s), initial encounter for closed fracture: Secondary | ICD-10-CM

## 2015-05-08 DIAGNOSIS — Z9889 Other specified postprocedural states: Secondary | ICD-10-CM

## 2015-05-08 MED ORDER — HYDROCODONE-ACETAMINOPHEN 5-325 MG PO TABS: 1.0000 | ORAL_TABLET | Freq: Four times a day (QID) | ORAL | Status: DC | PRN

## 2015-05-09 NOTE — Progress Notes (Signed)
Patient ID: [name], female   DOB: 07-04-1932, 81 y.o.   MRN: [id number]  Subjective: 81 year old female presents the office they for follow-up evaluation of left hallux fracture, total nail avulsion secondary to infection. She states overall she was doing well but she does continue to have some pain to the area particularly at night after she's been on her feet all day. She states that she does with a surgical shoe at times around the house although she did not wear it today to the office. She does not wear consistently throughout the day. She states that she has been soaking the foot in Epson salt soaks cover with interbody ointment and a Band-Aid and she points to the nail procedure site and the area blisters healed well. She denies any systemic complaints as fevers, chills, nausea, vomiting. No other complaints at this time in no acute changes since last appointment. She has finished her course of antibiotic.  Objective: AAO 3, NAD DP/PT pulses palpable, CRT less than 3 seconds Protective sensation decreased with Simms Weinstein monofilament Status post left hallux total nail avulsion which is healed. There is a small scab formation overlying the nail procedure site. The area of the blister on the proximal hallux is also healed. There is no overlying edema, erythema, increase in warmth. There is slight tenderness palpation overlying the distal hallux overlying the site of the fracture. No other areas tenderness to bilateral lower chemise. No overlying edema, erythema, increase in warmth elsewhere. There is no pain with calf compression, swelling, warmth, erythema.  Assessment: 81 year old female 1 week status post left hallux total nail avulsion secondary to injury which is healing; left hallux fracture  Plan: -Treatment options discussed including all alternatives, risks, and complications -Appears of the nail procedure site and side of the blisters healed. Which continues but a Band-Aid  over top of the protection.  -Recommended continue surgical shoe at all times given the fracture. She was asking for pain medication of the toe does become painful. She had some Percocet at home for which she was taken however she ran out. Prescribed Vicodin for 1 time prescription.  -Monitor for any clinical signs or symptoms of infection and directed to call the office immediately should any occur or go to the ER. -Follow-up in 2-3 weeks or sooner if any problems are to arise. In the meantime I encouraged her to call the office with any questions, concerns, change in symptoms. -X-ray next appointment- elevate hallux.     Ovid CurdMatthew Duquan Gillooly, DPM

## 2015-05-29 ENCOUNTER — Ambulatory Visit: Payer: Medicare HMO | Admitting: Podiatry

## 2015-06-11 ENCOUNTER — Encounter: Payer: Medicare HMO | Admitting: Gastroenterology

## 2016-03-09 ENCOUNTER — Other Ambulatory Visit: Payer: Self-pay | Admitting: Obstetrics and Gynecology

## 2016-05-06 ENCOUNTER — Other Ambulatory Visit: Payer: Self-pay | Admitting: Obstetrics and Gynecology

## 2016-08-06 ENCOUNTER — Ambulatory Visit (INDEPENDENT_AMBULATORY_CARE_PROVIDER_SITE_OTHER): Payer: Medicare HMO

## 2016-08-06 ENCOUNTER — Encounter: Payer: Self-pay | Admitting: Podiatry

## 2016-08-06 ENCOUNTER — Ambulatory Visit (INDEPENDENT_AMBULATORY_CARE_PROVIDER_SITE_OTHER): Payer: Medicare HMO | Admitting: Podiatry

## 2016-08-06 VITALS — BP 119/61 | HR 88 | Resp 20

## 2016-08-06 DIAGNOSIS — S92301A Fracture of unspecified metatarsal bone(s), right foot, initial encounter for closed fracture: Secondary | ICD-10-CM | POA: Diagnosis not present

## 2016-08-06 DIAGNOSIS — M25474 Effusion, right foot: Secondary | ICD-10-CM

## 2016-08-06 DIAGNOSIS — S92911A Unspecified fracture of right toe(s), initial encounter for closed fracture: Secondary | ICD-10-CM | POA: Diagnosis not present

## 2016-08-06 DIAGNOSIS — M79671 Pain in right foot: Secondary | ICD-10-CM | POA: Diagnosis not present

## 2016-08-06 MED ORDER — HYDROCODONE-ACETAMINOPHEN 10-325 MG PO TABS: 1.0000 | ORAL_TABLET | Freq: Three times a day (TID) | ORAL | 0 refills | Status: DC | PRN

## 2016-08-06 MED ORDER — NONFORMULARY OR COMPOUNDED ITEM: 1.0000 g | Freq: Four times a day (QID) | 2 refills | Status: DC

## 2016-08-06 NOTE — Addendum Note (Signed)
Addended by: Renaldo ReelPARRY, Anushri Casalino A on: 08/06/2016 06:00 PM   Modules accepted: Orders

## 2016-08-06 NOTE — Progress Notes (Signed)
Subjective: Patient presents today for pain and tenderness the right foot. Patient states that she had a slip and fall in the shower at her home. This incident occurred on 07/20/2016. Patient presented to an urgent care at which time she was diagnosed with multiple fractures of the right foot. Patient presents today for follow-up evaluation and management.   Objective: Physical Exam General: The patient is alert and oriented x3 in no acute distress.  Dermatology: Skin is warm, dry and supple bilateral lower extremities. Negative for open lesions or macerations.  Vascular: Palpable pedal pulses bilaterally. No edema or erythema noted. Capillary refill within normal limits.  Neurological: Epicritic and protective threshold grossly intact bilaterally.   Musculoskeletal Exam: Range of motion within normal limits to all pedal and ankle joints bilateral. Muscle strength 5/5 in all groups bilateral.   Radiographic Exam:  Nondisplaced closed fracture noted to the base of the fifth metatarsal tuberosity right foot. Possible fractures to digits 3, 4 and 5 right foot.  Diffuse osteoporosis and degenerative joint disease secondary to arthritis was noted throughout the right foot joints.  Assessment: #1 fracture fifth metatarsal right foot #2 fracture digits 3, 4, and 5 right foot #3 pain in right foot #4 edema right foot  Problem List Items Addressed This Visit    None    Visit Diagnoses    Right foot pain    -  Primary   Relevant Orders   DG Foot Complete Right        Plan of Care:  #1 Patient was evaluated. #2 Recommended the patient is to be in a cam boot, which she owns already, a timeout she goes out of the house. Patient is okay to be a postoperative shoe around the house. Recommend minimizing walking and continue with rest ice elevation and compression. #3 prescription for Lortab 10 mg was dispensed the patient today along with a prescription for compounded pain cream via [facility]. #4 patient is to return to clinic in 4 weeks for follow-up x-rays    Dr. Felecia ShellingBrent M. Tanush Drees, DPM [facility]

## 2016-08-09 ENCOUNTER — Telehealth: Payer: Self-pay | Admitting: *Deleted

## 2016-08-09 NOTE — Telephone Encounter (Signed)
Dr. Logan BoresEvans ordered [facility] Combination pain cream.  [facility] to [name]

## 2016-08-09 NOTE — Telephone Encounter (Signed)
Thayer Ohmhris [facility] states pt received pain medication at last visit and is calling for a cream. I informed pt the cream would come from [facility] and gave [phone] for her to look out for. Pt states understanding.

## 2016-08-10 ENCOUNTER — Telehealth: Payer: Self-pay | Admitting: *Deleted

## 2016-08-10 NOTE — Telephone Encounter (Signed)
Pt called for her pain salve. Called pt, home phone rang over 1 1/2 minutes. Called pt's mobile line and it was a business.

## 2016-09-03 ENCOUNTER — Encounter: Payer: Medicare HMO | Admitting: Podiatry

## 2016-09-03 NOTE — Progress Notes (Signed)
This encounter was created in error - please disregard.

## 2017-05-09 NOTE — Progress Notes (Signed)
05/10/2017 10:08 AM   [name] 1931/12/12 [id number]  Referring provider: Marcine Matar., MD [address] [place], [place] [address]  Chief Complaint  Patient presents with  . Dysuria    New patient    HPI: Patient is a 83 -year-old Caucasian female who is referred to [place] by, Dr.Marshall Dareen Piano, for dysuria associated with incontinence and foul smelling urine.    Patient states that she has had urinary incontinence for months.  Patient has incontinence with urgency and stress.  She is experiencing 3 to 4 incontinent episodes during the day.  She is experiencing 3 incontinent episodes during the night.  Her incontinence volume is large soaking through her clothes.  She is wearing 5 pads/depends daily.    She is having associated urinary frequency x q 30 minutes, urgency, dysuria after she completes urination and nocturia x 4.   She does not have a history of urinary tract infections, STI's or injury to the bladder.   She had an episode of hematuria a few months ago, but she states that it was associated with an UTI.  The hematuria has since cleared.  She denies suprapubic pain, back pain, abdominal pain or flank pain.   She has not had any recent fevers, chills, nausea or vomiting.   She does have a history of nephrolithiasis, but she has not had GU surgery or GU trauma.   She is post menopausal.   She admits to constipation and/or diarrhea.   She is not having pain with bladder filling.   She has not had any recent imaging studies.    She is drinking several glasses of water daily.   She is drinking one caffeinated beverages daily.  One Ginger ale daily.  She is not drinking alcoholic beverages daily.    Her risk factors for incontinence are obesity, a family history of incontinence, age, caffeine, vaginal atrophy and pelvic surgery.    She is taking antihistamines, decongestants, opioids and OAB medication.    She states her urine has a foul odor from time to  time.  Her UA was unremarkable.    PMH: Past Medical History:  Diagnosis Date  . Anxiety   . Depression   . GERD (gastroesophageal reflux disease)   . Hip fracture (HCC)   . Hyperlipemia   . Neuropathy   . Vertigo     Surgical History: Past Surgical History:  Procedure Laterality Date  . ABDOMINAL HYSTERECTOMY    . APPENDECTOMY    . BACK SURGERY     tumor removal bengin  . CHOLECYSTECTOMY      Home Medications:  Allergies as of 05/10/2017      Reactions   Ampicillin    Zpac   Sulfa Antibiotics       Medication List       Accurate as of 05/10/17 10:08 AM. Always use your most recent med list.          ALPRAZolam 0.25 MG tablet Commonly known as:  XANAX Take 0.25 mg by mouth at bedtime as needed for anxiety.   aspirin EC 81 MG tablet Take 81 mg by mouth daily.   cetirizine 10 MG tablet Commonly known as:  ZYRTEC Take 10 mg by mouth daily.   citalopram 10 MG tablet Commonly known as:  CELEXA Take 10 mg by mouth daily.   clotrimazole 1 % cream Commonly known as:  LOTRIMIN Apply 1 application topically 2 (two) times daily.   conjugated estrogens  vaginal cream Commonly known as:  PREMARIN Place 1 Applicatorful vaginally daily. Apply 0.5mg  (pea-sized amount)  just inside the vaginal introitus with a finger-tip every night for two weeks and then Monday, Wednesday and Friday nights.   fluticasone 50 MCG/ACT nasal spray Commonly known as:  FLONASE Place into both nostrils daily.   gabapentin 300 MG capsule Commonly known as:  NEURONTIN Take 300 mg by mouth 3 (three) times daily.   glipiZIDE 5 MG tablet Commonly known as:  GLUCOTROL Take by mouth daily before breakfast.   guaifenesin 100 MG/5ML syrup Commonly known as:  ROBITUSSIN Take 200 mg by mouth 3 (three) times daily as needed for cough.   ketotifen 0.025 % ophthalmic solution Commonly known as:  ZADITOR 1 drop 2 (two) times daily.   loperamide 2 MG capsule Commonly known as:  IMODIUM Take  by mouth as needed for diarrhea or loose stools.   meclizine 25 MG tablet Commonly known as:  ANTIVERT Take 25 mg by mouth 3 (three) times daily as needed for dizziness.   metoCLOPramide 5 MG tablet Commonly known as:  REGLAN Take 5 mg by mouth 4 (four) times daily.   omeprazole 20 MG capsule Commonly known as:  PRILOSEC Take 20 mg by mouth 2 (two) times daily before a meal.   ondansetron 4 MG tablet Commonly known as:  ZOFRAN Take 4 mg by mouth every 8 (eight) hours as needed for nausea or vomiting.   oxybutynin 5 MG tablet Commonly known as:  DITROPAN TAKE 1 TABLET BY MOUTH TWICE A DAY   traMADol 50 MG tablet Commonly known as:  ULTRAM Take by mouth every 6 (six) hours as needed.   Vitamin D3 5000 units Caps Take by mouth.   ZEASORB-AF 2 % powder Generic drug:  miconazole Apply topically as needed for itching.       Allergies:  Allergies  Allergen Reactions  . Ampicillin     Zpac  . Sulfa Antibiotics     Family History: Family History  Problem Relation Age of Onset  . Diabetes Mother   . Diabetes Sister        x2  . Breast cancer Sister   . Heart disease Father   . Heart disease Brother        x3  . Bladder Cancer Neg Hx   . Kidney cancer Neg Hx   . Prostate cancer Neg Hx     Social History:  reports that she has never smoked. She has never used smokeless tobacco. She reports that she does not drink alcohol or use drugs.  ROS: UROLOGY Frequent Urination?: Yes Hard to postpone urination?: Yes Burning/pain with urination?: Yes Get up at night to urinate?: Yes Leakage of urine?: Yes Urine stream starts and stops?: No Trouble starting stream?: No Do you have to strain to urinate?: No Blood in urine?: No Urinary tract infection?: No Sexually transmitted disease?: No Injury to kidneys or bladder?: No Painful intercourse?: No Weak stream?: No Currently pregnant?: No Vaginal bleeding?: No Last menstrual period?:  hystere  Gastrointestinal Nausea?: Yes Vomiting?: No Indigestion/heartburn?: No Diarrhea?: Yes Constipation?: No  Constitutional Fever: No Night sweats?: Yes Weight loss?: No Fatigue?: No  Skin Skin rash/lesions?: No Itching?: No  Eyes Blurred vision?: Yes Double vision?: No  Ears/Nose/Throat Sore throat?: No Sinus problems?: No  Hematologic/Lymphatic Swollen glands?: No Easy bruising?: No  Cardiovascular Leg swelling?: Yes Chest pain?: No  Respiratory Cough?: No Shortness of breath?: Yes  Endocrine Excessive thirst?: Yes  Musculoskeletal  Back pain?: Yes Joint pain?: Yes  Neurological Headaches?: Yes Dizziness?: Yes  Psychologic Depression?: Yes Anxiety?: Yes  Physical Exam: BP (!) 110/53   Pulse 89   Ht 5' (1.524 m)   Wt 190 lb (86.2 kg)   BMI 37.11 kg/m   Constitutional: Well nourished. Alert and oriented, No acute distress. HEENT: [place] AT, moist mucus membranes. Trachea midline, no masses. Cardiovascular: No clubbing, cyanosis, or edema. Respiratory: Normal respiratory effort, no increased work of breathing. GI: Abdomen is soft, non tender, non distended, no abdominal masses. Liver and spleen not palpable.  No hernias appreciated.  Stool sample for occult testing is not indicated.   GU: No CVA tenderness.  No bladder fullness or masses.  Atrophic external genitalia, normal pubic hair distribution, no lesions.  Normal urethral meatus, no lesions, no prolapse, no discharge.   No urethral masses, tenderness and/or tenderness. No bladder fullness, tenderness or masses. Normal vagina mucosa, poor estrogen effect, no discharge, no lesions, good pelvic support, Grade III cystocele.  No rectocele noted.  Cervix, uterus and adnexa are surgically absent.  Anus and perineum are without rashes or lesions.    Skin: No rashes, bruises or suspicious lesions. Lymph: No cervical or inguinal adenopathy. Neurologic: Grossly intact, no focal deficits, moving all 4  extremities. Psychiatric: Normal mood and affect.  Laboratory Data: Urinalysis Unremarkable.  See EPIC.    Pertinent Imaging: No recent imaging today    Assessment & Plan:    1. Foul smelling urine  - mostly likely due to trapped urine in the cystocele   2. MIxed incontinence  - offered behavioral therapies, bladder training and bladder control strategies  - pelvic floor muscle training -   - fluid management - has increased her water intake  - patient is taking oxybutynin   - offered refer to gynecology for a pessary fitting - patient interested in a referral   3. Vaginal atrophy  - recommend vaginal estrogen cream three nights weekly  - script sent to pharmacy  - RTC in 3 months for exam  4. Cystocele  - refer to gynecology for pessary fitting                                       Return in about 3 months (around 08/10/2017) for OAB questionnaire, PVR and exam.  These notes generated with voice recognition software. I apologize for typographical errors.  Michiel Cowboy, PA-C  [facility] [address] [place], [place] [address] [phone]

## 2017-05-10 ENCOUNTER — Encounter: Payer: Self-pay | Admitting: Urology

## 2017-05-10 ENCOUNTER — Ambulatory Visit (INDEPENDENT_AMBULATORY_CARE_PROVIDER_SITE_OTHER): Payer: Medicare (Managed Care) | Admitting: Urology

## 2017-05-10 VITALS — BP 110/53 | HR 89 | Ht 60.0 in | Wt 190.0 lb

## 2017-05-10 DIAGNOSIS — R300 Dysuria: Secondary | ICD-10-CM | POA: Diagnosis not present

## 2017-05-10 DIAGNOSIS — N952 Postmenopausal atrophic vaginitis: Secondary | ICD-10-CM | POA: Diagnosis not present

## 2017-05-10 DIAGNOSIS — N3946 Mixed incontinence: Secondary | ICD-10-CM

## 2017-05-10 DIAGNOSIS — R8290 Unspecified abnormal findings in urine: Secondary | ICD-10-CM

## 2017-05-10 DIAGNOSIS — N8111 Cystocele, midline: Secondary | ICD-10-CM

## 2017-05-10 LAB — MICROSCOPIC EXAMINATION

## 2017-05-10 LAB — URINALYSIS, COMPLETE
BILIRUBIN UA: NEGATIVE
GLUCOSE, UA: NEGATIVE
KETONES UA: NEGATIVE
LEUKOCYTES UA: NEGATIVE
Nitrite, UA: NEGATIVE
PH UA: 5.5 (ref 5.0–7.5)
SPEC GRAV UA: 1.025 (ref 1.005–1.030)
UUROB: 0.2 mg/dL (ref 0.2–1.0)

## 2017-05-10 MED ORDER — ESTROGENS, CONJUGATED 0.625 MG/GM VA CREA: 1.0000 | TOPICAL_CREAM | Freq: Every day | VAGINAL | 12 refills | Status: DC

## 2017-05-10 NOTE — Progress Notes (Signed)
In and Out Catheterization  Patient is present today for a I & O catheterization for UA. Patient was cleaned and prepped in a sterile fashion with betadine and Lidocaine 2% jelly was instilled into the urethra.  A 14FR cath was inserted no complications were noted , 100ml of urine return was noted, urine was yellow in color. A clean urine sample was collected for UA. Bladder was drained  And catheter was removed with out difficulty.    Preformed by: Eligha BridegroomSarah Zayonna Ayuso, CMA

## 2017-05-21 ENCOUNTER — Emergency Department: Payer: Medicare (Managed Care)

## 2017-05-21 ENCOUNTER — Inpatient Hospital Stay
Admission: EM | Admit: 2017-05-21 | Discharge: 2017-05-24 | DRG: 177 | Disposition: A | Discharge status: Skilled Nursing Facility | Payer: Medicare (Managed Care) | Attending: Internal Medicine | Admitting: Internal Medicine

## 2017-05-21 ENCOUNTER — Encounter: Payer: Self-pay | Admitting: Emergency Medicine

## 2017-05-21 DIAGNOSIS — Z7982 Long term (current) use of aspirin: Secondary | ICD-10-CM

## 2017-05-21 DIAGNOSIS — R7989 Other specified abnormal findings of blood chemistry: Secondary | ICD-10-CM | POA: Diagnosis not present

## 2017-05-21 DIAGNOSIS — F0390 Unspecified dementia without behavioral disturbance: Secondary | ICD-10-CM | POA: Diagnosis present

## 2017-05-21 DIAGNOSIS — I447 Left bundle-branch block, unspecified: Secondary | ICD-10-CM | POA: Diagnosis present

## 2017-05-21 DIAGNOSIS — E785 Hyperlipidemia, unspecified: Secondary | ICD-10-CM | POA: Diagnosis present

## 2017-05-21 DIAGNOSIS — Z79899 Other long term (current) drug therapy: Secondary | ICD-10-CM | POA: Diagnosis not present

## 2017-05-21 DIAGNOSIS — Z66 Do not resuscitate: Secondary | ICD-10-CM | POA: Diagnosis present

## 2017-05-21 DIAGNOSIS — I248 Other forms of acute ischemic heart disease: Secondary | ICD-10-CM | POA: Diagnosis present

## 2017-05-21 DIAGNOSIS — R4182 Altered mental status, unspecified: Secondary | ICD-10-CM

## 2017-05-21 DIAGNOSIS — F329 Major depressive disorder, single episode, unspecified: Secondary | ICD-10-CM | POA: Diagnosis present

## 2017-05-21 DIAGNOSIS — Z7951 Long term (current) use of inhaled steroids: Secondary | ICD-10-CM | POA: Diagnosis not present

## 2017-05-21 DIAGNOSIS — K219 Gastro-esophageal reflux disease without esophagitis: Secondary | ICD-10-CM | POA: Diagnosis present

## 2017-05-21 DIAGNOSIS — F419 Anxiety disorder, unspecified: Secondary | ICD-10-CM | POA: Diagnosis present

## 2017-05-21 DIAGNOSIS — D649 Anemia, unspecified: Secondary | ICD-10-CM | POA: Diagnosis present

## 2017-05-21 DIAGNOSIS — E1142 Type 2 diabetes mellitus with diabetic polyneuropathy: Secondary | ICD-10-CM | POA: Diagnosis present

## 2017-05-21 DIAGNOSIS — Z8249 Family history of ischemic heart disease and other diseases of the circulatory system: Secondary | ICD-10-CM | POA: Diagnosis not present

## 2017-05-21 DIAGNOSIS — E119 Type 2 diabetes mellitus without complications: Secondary | ICD-10-CM

## 2017-05-21 DIAGNOSIS — Z7984 Long term (current) use of oral hypoglycemic drugs: Secondary | ICD-10-CM | POA: Diagnosis not present

## 2017-05-21 DIAGNOSIS — Z880 Allergy status to penicillin: Secondary | ICD-10-CM | POA: Diagnosis not present

## 2017-05-21 DIAGNOSIS — Z90710 Acquired absence of both cervix and uterus: Secondary | ICD-10-CM

## 2017-05-21 DIAGNOSIS — J690 Pneumonitis due to inhalation of food and vomit: Secondary | ICD-10-CM | POA: Diagnosis not present

## 2017-05-21 DIAGNOSIS — S42214A Unspecified nondisplaced fracture of surgical neck of right humerus, initial encounter for closed fracture: Secondary | ICD-10-CM | POA: Diagnosis present

## 2017-05-21 DIAGNOSIS — T148XXA Other injury of unspecified body region, initial encounter: Secondary | ICD-10-CM

## 2017-05-21 DIAGNOSIS — R778 Other specified abnormalities of plasma proteins: Secondary | ICD-10-CM

## 2017-05-21 DIAGNOSIS — J9601 Acute respiratory failure with hypoxia: Secondary | ICD-10-CM | POA: Diagnosis present

## 2017-05-21 DIAGNOSIS — W19XXXA Unspecified fall, initial encounter: Secondary | ICD-10-CM | POA: Diagnosis present

## 2017-05-21 DIAGNOSIS — J9600 Acute respiratory failure, unspecified whether with hypoxia or hypercapnia: Secondary | ICD-10-CM

## 2017-05-21 DIAGNOSIS — A419 Sepsis, unspecified organism: Secondary | ICD-10-CM

## 2017-05-21 DIAGNOSIS — Z882 Allergy status to sulfonamides status: Secondary | ICD-10-CM | POA: Diagnosis not present

## 2017-05-21 DIAGNOSIS — S42301A Unspecified fracture of shaft of humerus, right arm, initial encounter for closed fracture: Secondary | ICD-10-CM | POA: Diagnosis present

## 2017-05-21 DIAGNOSIS — Z833 Family history of diabetes mellitus: Secondary | ICD-10-CM | POA: Diagnosis not present

## 2017-05-21 DIAGNOSIS — F32A Depression, unspecified: Secondary | ICD-10-CM | POA: Diagnosis present

## 2017-05-21 LAB — TROPONIN I
TROPONIN I: 0.91 ng/mL — AB (ref <0.03)
Troponin I: 1.01 ng/mL (ref <0.03)

## 2017-05-21 LAB — URINALYSIS, COMPLETE (UACMP) WITH MICROSCOPIC
Bilirubin Urine: NEGATIVE
Glucose, UA: NEGATIVE mg/dL
Hgb urine dipstick: NEGATIVE
Ketones, ur: NEGATIVE mg/dL
Leukocytes, UA: NEGATIVE
Nitrite: NEGATIVE
Protein, ur: 100 mg/dL — AB
SPECIFIC GRAVITY, URINE: 1.026 (ref 1.005–1.030)
pH: 5 (ref 5.0–8.0)

## 2017-05-21 LAB — COMPREHENSIVE METABOLIC PANEL
ALT: 11 U/L — AB (ref 14–54)
AST: 18 U/L (ref 15–41)
Albumin: 2.7 g/dL — ABNORMAL LOW (ref 3.5–5.0)
Alkaline Phosphatase: 68 U/L (ref 38–126)
Anion gap: 9 (ref 5–15)
BUN: 39 mg/dL — ABNORMAL HIGH (ref 6–20)
CALCIUM: 8.9 mg/dL (ref 8.9–10.3)
CHLORIDE: 102 mmol/L (ref 101–111)
CO2: 27 mmol/L (ref 22–32)
CREATININE: 0.91 mg/dL (ref 0.44–1.00)
GFR, EST NON AFRICAN AMERICAN: 57 mL/min — AB (ref >60)
Glucose, Bld: 177 mg/dL — ABNORMAL HIGH (ref 65–99)
Potassium: 4.3 mmol/L (ref 3.5–5.1)
Sodium: 138 mmol/L (ref 135–145)
Total Bilirubin: 0.5 mg/dL (ref 0.3–1.2)
Total Protein: 7.1 g/dL (ref 6.5–8.1)

## 2017-05-21 LAB — CBC WITH DIFFERENTIAL/PLATELET
Basophils Absolute: 0.1 10*3/uL (ref 0–0.1)
Basophils Relative: 1 %
EOS PCT: 1 %
Eosinophils Absolute: 0.1 10*3/uL (ref 0–0.7)
HCT: 33.2 % — ABNORMAL LOW (ref 35.0–47.0)
Hemoglobin: 10.4 g/dL — ABNORMAL LOW (ref 12.0–16.0)
LYMPHS PCT: 10 %
Lymphs Abs: 1.3 10*3/uL (ref 1.0–3.6)
MCH: 24.2 pg — AB (ref 26.0–34.0)
MCHC: 31.4 g/dL — ABNORMAL LOW (ref 32.0–36.0)
MCV: 77.3 fL — ABNORMAL LOW (ref 80.0–100.0)
MONO ABS: 1.1 10*3/uL — AB (ref 0.2–0.9)
Monocytes Relative: 10 %
Neutro Abs: 9.5 10*3/uL — ABNORMAL HIGH (ref 1.4–6.5)
Neutrophils Relative %: 78 %
PLATELETS: 294 10*3/uL (ref 150–440)
RBC: 4.29 MIL/uL (ref 3.80–5.20)
RDW: 14.7 % — ABNORMAL HIGH (ref 11.5–14.5)
WBC: 12.1 10*3/uL — AB (ref 3.6–11.0)

## 2017-05-21 LAB — LACTIC ACID, PLASMA: LACTIC ACID, VENOUS: 1.1 mmol/L (ref 0.5–1.9)

## 2017-05-21 MED ORDER — SODIUM CHLORIDE 0.9 % IV BOLUS (SEPSIS)
500.0000 mL | Freq: Once | INTRAVENOUS | Status: AC
  Administered 2017-05-21: 500 mL via INTRAVENOUS

## 2017-05-21 MED ORDER — DIVALPROEX SODIUM 125 MG PO CSDR
250.0000 mg | DELAYED_RELEASE_CAPSULE | Freq: Two times a day (BID) | ORAL | Status: DC
  Administered 2017-05-22 – 2017-05-24 (×6): 250 mg via ORAL
  Filled 2017-05-21 (×7): qty 2

## 2017-05-21 MED ORDER — ENOXAPARIN SODIUM 40 MG/0.4ML ~~LOC~~ SOLN
40.0000 mg | SUBCUTANEOUS | Status: DC
Start: 2017-05-22 — End: 2017-05-22

## 2017-05-21 MED ORDER — NALOXONE HCL 0.4 MG/ML IJ SOLN
0.2000 mg | Freq: Once | INTRAMUSCULAR | Status: AC
  Administered 2017-05-21: 0.2 mg via INTRAVENOUS

## 2017-05-21 MED ORDER — ONDANSETRON HCL 4 MG PO TABS
4.0000 mg | ORAL_TABLET | Freq: Four times a day (QID) | ORAL | Status: DC | PRN
  Filled 2017-05-21: qty 1

## 2017-05-21 MED ORDER — ALBUTEROL SULFATE (2.5 MG/3ML) 0.083% IN NEBU
5.0000 mg | INHALATION_SOLUTION | Freq: Once | RESPIRATORY_TRACT | Status: AC
  Administered 2017-05-21: 5 mg via RESPIRATORY_TRACT
  Filled 2017-05-21: qty 6

## 2017-05-21 MED ORDER — ONDANSETRON HCL 4 MG/2ML IJ SOLN
4.0000 mg | Freq: Once | INTRAMUSCULAR | Status: AC
  Administered 2017-05-21: 4 mg via INTRAVENOUS
  Filled 2017-05-21: qty 2

## 2017-05-21 MED ORDER — ASPIRIN EC 81 MG PO TBEC
81.0000 mg | DELAYED_RELEASE_TABLET | Freq: Every day | ORAL | Status: DC
  Administered 2017-05-22 – 2017-05-24 (×3): 81 mg via ORAL
  Filled 2017-05-21 (×3): qty 1

## 2017-05-21 MED ORDER — METOCLOPRAMIDE HCL 5 MG PO TABS
5.0000 mg | ORAL_TABLET | Freq: Four times a day (QID) | ORAL | Status: DC
  Administered 2017-05-22 – 2017-05-24 (×11): 5 mg via ORAL
  Filled 2017-05-21 (×11): qty 1

## 2017-05-21 MED ORDER — SODIUM CHLORIDE 0.9 % IV SOLN
INTRAVENOUS | Status: DC
  Administered 2017-05-22: via INTRAVENOUS

## 2017-05-21 MED ORDER — ONDANSETRON HCL 4 MG/2ML IJ SOLN: 4.0000 mg | Freq: Four times a day (QID) | INTRAMUSCULAR | Status: DC | PRN

## 2017-05-21 MED ORDER — TRAMADOL HCL 50 MG PO TABS
50.0000 mg | ORAL_TABLET | ORAL | Status: DC | PRN
  Administered 2017-05-22 (×2): 50 mg via ORAL
  Filled 2017-05-21 (×2): qty 1

## 2017-05-21 MED ORDER — DEXTROSE 5 % IV SOLN
2.0000 g | Freq: Two times a day (BID) | INTRAVENOUS | Status: DC
  Administered 2017-05-22 – 2017-05-23 (×4): 2 g via INTRAVENOUS
  Filled 2017-05-21 (×5): qty 2

## 2017-05-21 MED ORDER — DEXTROSE 5 % IV SOLN
500.0000 mg | Freq: Once | INTRAVENOUS | Status: AC
  Administered 2017-05-21: 500 mg via INTRAVENOUS
  Filled 2017-05-21: qty 500

## 2017-05-21 MED ORDER — CITALOPRAM HYDROBROMIDE 20 MG PO TABS
10.0000 mg | ORAL_TABLET | Freq: Every day | ORAL | Status: DC
  Administered 2017-05-22 – 2017-05-24 (×3): 10 mg via ORAL
  Filled 2017-05-21 (×3): qty 1

## 2017-05-21 MED ORDER — KETOTIFEN FUMARATE 0.025 % OP SOLN
1.0000 [drp] | Freq: Two times a day (BID) | OPHTHALMIC | Status: DC
  Administered 2017-05-22 – 2017-05-24 (×6): 1 [drp] via OPHTHALMIC
  Filled 2017-05-21: qty 5

## 2017-05-21 MED ORDER — ACETAMINOPHEN 325 MG PO TABS
650.0000 mg | ORAL_TABLET | Freq: Four times a day (QID) | ORAL | Status: DC | PRN
  Administered 2017-05-24: 650 mg via ORAL
  Filled 2017-05-21: qty 2

## 2017-05-21 MED ORDER — PANTOPRAZOLE SODIUM 40 MG PO TBEC
40.0000 mg | DELAYED_RELEASE_TABLET | Freq: Every day | ORAL | Status: DC
  Administered 2017-05-22 – 2017-05-24 (×3): 40 mg via ORAL
  Filled 2017-05-21 (×3): qty 1

## 2017-05-21 MED ORDER — DEXTROSE 5 % IV SOLN
1.0000 g | Freq: Once | INTRAVENOUS | Status: AC
  Administered 2017-05-21: 1 g via INTRAVENOUS
  Filled 2017-05-21: qty 10

## 2017-05-21 MED ORDER — ACETAMINOPHEN 650 MG RE SUPP: 650.0000 mg | Freq: Four times a day (QID) | RECTAL | Status: DC | PRN

## 2017-05-21 MED ORDER — GABAPENTIN 300 MG PO CAPS
300.0000 mg | ORAL_CAPSULE | Freq: Three times a day (TID) | ORAL | Status: DC
  Administered 2017-05-22 – 2017-05-24 (×9): 300 mg via ORAL
  Filled 2017-05-21 (×9): qty 1

## 2017-05-21 MED ORDER — NALOXONE HCL 2 MG/2ML IJ SOSY
PREFILLED_SYRINGE | INTRAMUSCULAR | Status: AC
  Filled 2017-05-21: qty 2

## 2017-05-21 MED ORDER — VANCOMYCIN HCL IN DEXTROSE 1-5 GM/200ML-% IV SOLN
1000.0000 mg | Freq: Once | INTRAVENOUS | Status: AC
  Administered 2017-05-22: 1000 mg via INTRAVENOUS
  Filled 2017-05-21: qty 200

## 2017-05-21 NOTE — ED Notes (Signed)
Pt had another small watery stool and again unable to obtain the sample

## 2017-05-21 NOTE — H&P (Signed)
[facility] [place] [facility]   PATIENT NAME: [name]    MR#:  [id number]  DATE OF BIRTH:  [birth year]  DATE OF ADMISSION:  05/21/2017  PRIMARY CARE PHYSICIAN: [name], Marshall W, MD   REQUESTING/REFERRING PHYSICIAN: Roxan Hockeyobinson, M.D.  CHIEF COMPLAINT:   Chief Complaint  Patient presents with  . Altered Mental Status    HISTORY OF PRESENT ILLNESS:  Jessica Doneva Sprenkle  is a 83 y.o. female who presents with Confusion. Patient has a known history of dementia, but her family member her current mental status is significantly worse than normal. She did have a fall several days ago and broke her right humerus. Patient is unable to contribute to her history of present illness time and she is confused. ED evaluation showed possible pneumonia. Patient does meet sepsis criteria. Hospitals were called for admission  PAST MEDICAL HISTORY:   Past Medical History:  Diagnosis Date  . Anxiety   . Depression   . GERD (gastroesophageal reflux disease)   . Hip fracture (HCC)   . Hyperlipemia   . Neuropathy   . Vertigo     PAST SURGICAL HISTORY:   Past Surgical History:  Procedure Laterality Date  . ABDOMINAL HYSTERECTOMY    . APPENDECTOMY    . BACK SURGERY     tumor removal bengin  . CHOLECYSTECTOMY      SOCIAL HISTORY:   Social History  Substance Use Topics  . Smoking status: Never Smoker  . Smokeless tobacco: Never Used  . Alcohol use No    FAMILY HISTORY:   Family History  Problem Relation Age of Onset  . Diabetes Mother   . Diabetes Sister        x2  . Breast cancer Sister   . Heart disease Father   . Heart disease Brother        x3  . Bladder Cancer Neg Hx   . Kidney cancer Neg Hx   . Prostate cancer Neg Hx     DRUG ALLERGIES:   Allergies  Allergen Reactions  . Ampicillin     Zpac  . Sulfa Antibiotics     MEDICATIONS AT HOME:   Prior to Admission medications   Medication Sig Start Date End Date Taking? Authorizing Provider   acetaminophen (TYLENOL) 325 MG tablet Take 650 mg by mouth every 4 (four) hours as needed.   Yes [provider]  ALPRAZolam (XANAX) 0.25 MG tablet Take 0.25 mg by mouth at bedtime as needed for anxiety.   Yes [provider]  aspirin EC 81 MG tablet Take 81 mg by mouth daily.   Yes [provider]  cetirizine (ZYRTEC) 10 MG tablet Take 10 mg by mouth daily.   Yes [provider]  Cholecalciferol (VITAMIN D3) 2000 units TABS Take 2,000 Units by mouth daily.    Yes [provider]  Cholecalciferol 50000 units TABS Take 50,000 Units by mouth once a week. On Monday   Yes [provider]  citalopram (CELEXA) 10 MG tablet Take 10 mg by mouth daily.   Yes [provider]  clotrimazole (LOTRIMIN) 1 % cream Apply 1 application topically 2 (two) times daily.   Yes [provider]  conjugated estrogens (PREMARIN) vaginal cream Place 1 Applicatorful vaginally daily. Apply 0.5mg  (pea-sized amount)  just inside the vaginal introitus with a finger-tip every night for two weeks and then Monday, Wednesday and Friday nights. 05/10/17  Yes McGowan, Carollee HerterShannon A, PA-C  divalproex (DEPAKOTE SPRINKLE) 125 MG  capsule Take 250 mg by mouth 2 (two) times daily.   Yes [provider]  fluticasone (FLONASE) 50 MCG/ACT nasal spray Place into both nostrils daily.   Yes [provider]  gabapentin (NEURONTIN) 300 MG capsule Take 300 mg by mouth 3 (three) times daily.   Yes [provider]  glipiZIDE (GLUCOTROL) 5 MG tablet Take 2.5 mg by mouth 2 (two) times daily before a meal.    Yes [provider]  guaifenesin (ROBITUSSIN) 100 MG/5ML syrup Take 200 mg by mouth 3 (three) times daily as needed for cough.   Yes [provider]  HYDROcodone-acetaminophen (NORCO/VICODIN) 5-325 MG tablet Take 1-2 tablets by mouth every 4 (four) hours as needed for moderate pain.   Yes [provider]  ketotifen (ZADITOR) 0.025 %  ophthalmic solution Place 1 drop into both eyes 2 (two) times daily.    Yes [provider]  loperamide (IMODIUM) 2 MG capsule Take by mouth as needed for diarrhea or loose stools.   Yes [provider]  meclizine (ANTIVERT) 25 MG tablet Take 25 mg by mouth daily as needed for dizziness.    Yes [provider]  metoCLOPramide (REGLAN) 5 MG tablet Take 5 mg by mouth 4 (four) times daily.   Yes [provider]  miconazole (ZEASORB-AF) 2 % powder Apply topically as needed for itching.   Yes [provider]  olopatadine (PATANOL) 0.1 % ophthalmic solution Place 1 drop into both eyes daily.   Yes [provider]  omeprazole (PRILOSEC) 20 MG capsule Take 20 mg by mouth daily.    Yes [provider]  ondansetron (ZOFRAN) 4 MG tablet Take 4 mg by mouth every 8 (eight) hours as needed for nausea or vomiting.   Yes [provider]  oxybutynin (DITROPAN) 5 MG tablet TAKE 1 TABLET BY MOUTH TWICE A DAY 05/06/16  Yes Defrancesco, Prentice Docker, MD  senna-docusate (SENOKOT-S) 8.6-50 MG tablet Take 2 tablets by mouth 2 (two) times daily.   Yes [provider]  traMADol (ULTRAM) 50 MG tablet Take 50 mg by mouth every 4 (four) hours as needed.    Yes [provider]    REVIEW OF SYSTEMS:  Review of Systems  Unable to perform ROS: Acuity of condition     VITAL SIGNS:   Vitals:   05/21/17 1948 05/21/17 2000 05/21/17 2030 05/21/17 2100  BP:  (!) 119/48 (!) 126/54 134/69  Pulse:   97 98  Resp:   (!) 25 (!) 28  Temp: (!) 100.8 F (38.2 C)     TempSrc: Rectal     SpO2:   94% 96%  Weight:      Height:       Wt Readings from Last 3 Encounters:  05/21/17 86.2 kg (190 lb)  05/10/17 86.2 kg (190 lb)  05/01/15 87.5 kg (193 lb)    PHYSICAL EXAMINATION:  Physical Exam  Vitals reviewed. Constitutional: She appears well-developed and well-nourished. No distress.  HENT:  Head: Normocephalic and atraumatic.  Mouth/Throat:  Oropharynx is clear and moist.  Eyes: Pupils are equal, round, and reactive to light. Conjunctivae and EOM are normal. No scleral icterus.  Neck: Normal range of motion. Neck supple. No JVD present. No thyromegaly present.  Cardiovascular: Normal rate, regular rhythm and intact distal pulses.  Exam reveals no gallop and no friction rub.   No murmur heard. Respiratory: Effort normal. No respiratory distress. She has no wheezes. She has rales.  GI: Soft. Bowel sounds are  normal. She exhibits no distension. There is no tenderness.  Musculoskeletal: Normal range of motion. She exhibits no edema.  No arthritis, no gout  Lymphadenopathy:    She has no cervical adenopathy.  Neurological: She is alert. No cranial nerve deficit.  No dysarthria, no aphasia  Skin: Skin is warm and dry. No rash noted. No erythema.  Psychiatric:  Unable to fully assess due to patient condition    LABORATORY PANEL:   CBC  Recent Labs Lab 05/21/17 1900  WBC 12.1*  HGB 10.4*  HCT 33.2*  PLT 294   ------------------------------------------------------------------------------------------------------------------  Chemistries   Recent Labs Lab 05/21/17 1900  NA 138  K 4.3  CL 102  CO2 27  GLUCOSE 177*  BUN 39*  CREATININE 0.91  CALCIUM 8.9  AST 18  ALT 11*  ALKPHOS 68  BILITOT 0.5   ------------------------------------------------------------------------------------------------------------------  Cardiac Enzymes  Recent Labs Lab 05/21/17 2007  TROPONINI 1.01*   ------------------------------------------------------------------------------------------------------------------  RADIOLOGY:  Ct Head Wo Contrast  Result Date: 05/21/2017 CLINICAL DATA:  Altered menorrhea status, fall EXAM: CT HEAD WITHOUT CONTRAST TECHNIQUE: Contiguous axial images were obtained from the base of the skull through the vertex without intravenous contrast. COMPARISON:  01/12/2015 FINDINGS: Brain: No acute  territorial infarction, hemorrhage, or intracranial mass is seen. Atrophy and small vessel ischemic changes of the white matter. Stable ventricle size. Vascular: No hyperdense vessels.  Carotid artery calcifications. Skull: No fracture.  Small fluid in the left mastoid Sinuses/Orbits: Minimal mucosal thickening. No acute orbital abnormality. Other: None IMPRESSION: 1. No CT evidence for acute intracranial abnormality. 2. Atrophy and small vessel ischemic changes of the white matter Electronically Signed   By: Jasmine Pang M.D.   On: 05/21/2017 20:11   Dg Chest Port 1 View  Result Date: 05/21/2017 CLINICAL DATA:  Acute respiratory failure EXAM: PORTABLE CHEST 1 VIEW COMPARISON:  06/17/2014 chest radiograph. FINDINGS: Surgical clips overlie the right mediastinum. Stable cardiomediastinal silhouette with mild cardiomegaly. No pneumothorax. No pleural effusion. Mild pulmonary edema. Mild scarring versus atelectasis in the peripheral left mid lung. Stable apparent partial resection of the right posterior sixth rib. Comminuted proximal right humerus fracture involving the surgical neck, incompletely evaluated on this study. IMPRESSION: 1. Comminuted proximal right humerus fracture involving the surgical neck, which appears acute. Recommend dedicated right shoulder radiographs for further evaluation. 2. Mild congestive heart failure. 3. Mild scarring versus atelectasis in the peripheral left mid lung. These results were called by telephone at the time of interpretation on 05/21/2017 at 7:38 pm to Dr. Willy Eddy , who verbally acknowledged these results. Electronically Signed   By: Delbert Phenix M.D.   On: 05/21/2017 19:39    EKG:   Orders placed or performed during the hospital encounter of 05/21/17  . ED EKG  . ED EKG  . EKG 12-Lead  . EKG 12-Lead    IMPRESSION AND PLAN:  Principal Problem:   Sepsis (HCC) - lactate within normal limits, patient is hemodynamically stable, IV antibiotics started in  the ED, cultures sent Active Problems:   Aspiration pneumonia (HCC) - cause of her sepsis, IV antibiotics and supportive treatment when necessary   Elevated troponin - we'll trend her cardiac enzymes tonight, her enzyme in the ED was 0.9. If this rises any further we will start heparin. We'll get a cardiology consult.   Right humeral fracture - orthopedic consult   Anxiety - continue home meds   Diabetes (HCC) - sliding scale insulin with corresponding glucose checks   GERD (gastroesophageal  reflux disease) - home dose PPI   HLD (hyperlipidemia) - home dose anti lipid   Depression - home dose antidepressant  All the records are reviewed and case discussed with ED provider. Management plans discussed with the patient and/or family.  DVT PROPHYLAXIS: SubQ lovenox  GI PROPHYLAXIS: PPI  ADMISSION STATUS: Inpatient  CODE STATUS: DNR Code Status History    This patient does not have a recorded code status. Please follow your organizational policy for patients in this situation.    Advance Directive Documentation     Most Recent Value  Type of Advance Directive  Out of facility DNR (pink MOST or yellow form)  Pre-existing out of facility DNR order (yellow form or pink MOST form)  Yellow form placed in chart (order not valid for inpatient use)  "MOST" Form in Place?  -      TOTAL TIME TAKING CARE OF THIS PATIENT: 45 minutes.   Arham Symmonds FIELDING 05/21/2017, 9:42 PM  [employer]  [phone]  CC: Primary care physician; Lauro Regulus, MD  Note:  This document was prepared using Dragon voice recognition software and may include unintentional dictation errors.

## 2017-05-21 NOTE — ED Provider Notes (Signed)
[facility] Emergency Department Provider Note    First MD Initiated Contact with Patient 05/21/17 1802     (approximate)  I have reviewed the triage vital signs and the nursing notes.   HISTORY  Chief Complaint Altered Mental Status  Level V Caveat:  Acute metabolic encephalopathy  HPI [name] is a 83 y.o. female with recent falls on benzodiazepine and narcotic medication presents after being found unresponsive at rehabilitation center today.  Patient arrives in the ER and was alert. Does have some mild confusion the patient with history of dementia. Does have scattered ecchymosis on the face.   Patient found to be hypoxic on room air. Pupils are pinpoint. We'll give dose of Narcan to evaluate for response.   Past Medical History:  Diagnosis Date  . Anxiety   . Depression   . GERD (gastroesophageal reflux disease)   . Hip fracture (HCC)   . Hyperlipemia   . Neuropathy   . Vertigo    Family History  Problem Relation Age of Onset  . Diabetes Mother   . Diabetes Sister        x2  . Breast cancer Sister   . Heart disease Father   . Heart disease Brother        x3  . Bladder Cancer Neg Hx   . Kidney cancer Neg Hx   . Prostate cancer Neg Hx    Past Surgical History:  Procedure Laterality Date  . ABDOMINAL HYSTERECTOMY    . APPENDECTOMY    . BACK SURGERY     tumor removal bengin  . CHOLECYSTECTOMY     Patient Active Problem List   Diagnosis Date Noted  . Nausea and vomiting 03/31/2015  . Microcytic anemia 03/31/2015      Prior to Admission medications   Medication Sig Start Date End Date Taking? Authorizing Provider  acetaminophen (TYLENOL) 325 MG tablet Take 650 mg by mouth every 4 (four) hours as needed.   Yes [provider]  ALPRAZolam (XANAX) 0.25 MG tablet Take 0.25 mg by mouth at bedtime as needed for anxiety.   Yes [provider]  aspirin EC 81 MG tablet Take 81 mg by mouth daily.   Yes [provider]  cetirizine (ZYRTEC) 10 MG tablet Take 10 mg by mouth daily.   Yes [provider]  Cholecalciferol (VITAMIN D3) 2000 units TABS Take 2,000 Units by mouth daily.    Yes [provider]  Cholecalciferol 50000 units TABS Take 50,000 Units by mouth once a week. On Monday   Yes [provider]  citalopram (CELEXA) 10 MG tablet Take 10 mg by mouth daily.   Yes [provider]  clotrimazole (LOTRIMIN) 1 % cream Apply 1 application topically 2 (two) times daily.   Yes [provider]  conjugated estrogens (PREMARIN) vaginal cream Place 1 Applicatorful vaginally daily. Apply 0.5mg  (pea-sized amount)  just inside the vaginal introitus with a finger-tip every night for two weeks and then Monday, Wednesday and Friday nights. 05/10/17  Yes McGowan, Carollee HerterShannon A, PA-C  divalproex (DEPAKOTE SPRINKLE) 125 MG capsule Take 250 mg by mouth 2 (two) times daily.   Yes [provider]  fluticasone (FLONASE) 50 MCG/ACT nasal spray Place into both nostrils daily.   Yes [provider]  gabapentin (NEURONTIN) 300 MG capsule Take 300 mg by mouth 3 (three) times daily.   Yes [provider]  glipiZIDE (GLUCOTROL) 5 MG tablet Take 2.5 mg by mouth 2 (  two) times daily before a meal.    Yes [provider]  guaifenesin (ROBITUSSIN) 100 MG/5ML syrup Take 200 mg by mouth 3 (three) times daily as needed for cough.   Yes [provider]  HYDROcodone-acetaminophen (NORCO/VICODIN) 5-325 MG tablet Take 1-2 tablets by mouth every 4 (four) hours as needed for moderate pain.   Yes [provider]  ketotifen (ZADITOR) 0.025 % ophthalmic solution Place 1 drop into both eyes 2 (two) times daily.    Yes [provider]  loperamide (IMODIUM) 2 MG capsule Take by mouth as needed for diarrhea or loose stools.   Yes [provider]  meclizine (ANTIVERT) 25 MG tablet Take 25 mg by mouth daily as needed for dizziness.    Yes  [provider]  metoCLOPramide (REGLAN) 5 MG tablet Take 5 mg by mouth 4 (four) times daily.   Yes [provider]  miconazole (ZEASORB-AF) 2 % powder Apply topically as needed for itching.   Yes [provider]  olopatadine (PATANOL) 0.1 % ophthalmic solution Place 1 drop into both eyes daily.   Yes [provider]  omeprazole (PRILOSEC) 20 MG capsule Take 20 mg by mouth daily.    Yes [provider]  ondansetron (ZOFRAN) 4 MG tablet Take 4 mg by mouth every 8 (eight) hours as needed for nausea or vomiting.   Yes [provider]  oxybutynin (DITROPAN) 5 MG tablet TAKE 1 TABLET BY MOUTH TWICE A DAY 05/06/16  Yes Defrancesco, Prentice Docker, MD  senna-docusate (SENOKOT-S) 8.6-50 MG tablet Take 2 tablets by mouth 2 (two) times daily.   Yes [provider]  traMADol (ULTRAM) 50 MG tablet Take 50 mg by mouth every 4 (four) hours as needed.    Yes [provider]    Allergies Ampicillin and Sulfa antibiotics    Social History Social History  Substance Use Topics  . Smoking status: Never Smoker  . Smokeless tobacco: Never Used  . Alcohol use No    Review of Systems Patient denies headaches, rhinorrhea, blurry vision, numbness, shortness of breath, chest pain, edema, cough, abdominal pain, nausea, vomiting, diarrhea, dysuria, fevers, rashes or hallucinations unless otherwise stated above in HPI. ____________________________________________   PHYSICAL EXAM:  VITAL SIGNS: Vitals:   05/21/17 1930 05/21/17 1948  BP: (!) 111/47   Pulse:    Resp: (!) 22   Temp:  (!) 100.8 F (38.2 C)    Constitutional: ill appearing in moderate resp distress. Eyes: Conjunctivae are normal. Pupils 2mm and reactive Head: left facial acchymosis Nose: No congestion/rhinnorhea. Mouth/Throat: Mucous membranes are dry  Neck: No stridor. Painless ROM.  Cardiovascular: Normal rate, regular rhythm. Grossly normal heart sounds.  Good peripheral  circulation. Respiratory: mild tachypnea with diffuse rhonchorus breathsoundsGastrointestinal: Soft and nontender. No distention. No abdominal bruits. No CVA tenderness. Genitourinary: guaiac negative stool Musculoskeletal: No lower extremity tenderness nor edema.  No joint effusions.  Ecchymosis and ttp of rue  Neurologic:  Normal speech and language. No gross focal neurologic deficits are appreciated. No facial droop Skin:  Skin is warm, dry and intact. No rash noted. Psychiatric: Mood and affect are normal. Speech and behavior are normal.  ____________________________________________   LABS (all labs ordered are listed, but only abnormal results are displayed)  Results for orders placed or performed during the hospital encounter of 05/21/17 (from the past 24 hour(s))  CBC with Differential/Platelet     Status: Abnormal   Collection Time: 05/21/17  7:00 PM  Result Value Ref Range  WBC 12.1 (H) 3.6 - 11.0 K/uL   RBC 4.29 3.80 - 5.20 MIL/uL   Hemoglobin 10.4 (L) 12.0 - 16.0 g/dL   HCT 16.1 (L) 09.6 - 04.5 %   MCV 77.3 (L) 80.0 - 100.0 fL   MCH 24.2 (L) 26.0 - 34.0 pg   MCHC 31.4 (L) 32.0 - 36.0 g/dL   RDW 40.9 (H) 81.1 - 91.4 %   Platelets 294 150 - 440 K/uL   Neutrophils Relative % 78 %   Neutro Abs 9.5 (H) 1.4 - 6.5 K/uL   Lymphocytes Relative 10 %   Lymphs Abs 1.3 1.0 - 3.6 K/uL   Monocytes Relative 10 %   Monocytes Absolute 1.1 (H) 0.2 - 0.9 K/uL   Eosinophils Relative 1 %   Eosinophils Absolute 0.1 0 - 0.7 K/uL   Basophils Relative 1 %   Basophils Absolute 0.1 0 - 0.1 K/uL  Comprehensive metabolic panel     Status: Abnormal   Collection Time: 05/21/17  7:00 PM  Result Value Ref Range   Sodium 138 135 - 145 mmol/L   Potassium 4.3 3.5 - 5.1 mmol/L   Chloride 102 101 - 111 mmol/L   CO2 27 22 - 32 mmol/L   Glucose, Bld 177 (H) 65 - 99 mg/dL   BUN 39 (H) 6 - 20 mg/dL   Creatinine, Ser 7.82 0.44 - 1.00 mg/dL   Calcium 8.9 8.9 - 95.6 mg/dL   Total Protein 7.1 6.5 - 8.1  g/dL   Albumin 2.7 (L) 3.5 - 5.0 g/dL   AST 18 15 - 41 U/L   ALT 11 (L) 14 - 54 U/L   Alkaline Phosphatase 68 38 - 126 U/L   Total Bilirubin 0.5 0.3 - 1.2 mg/dL   GFR calc non Af Amer 57 (L) >60 mL/min   GFR calc Af Amer >60 >60 mL/min   Anion gap 9 5 - 15  Troponin I     Status: Abnormal   Collection Time: 05/21/17  7:00 PM  Result Value Ref Range   Troponin I 0.91 (HH) <0.03 ng/mL   ____________________________________________  EKG My review and personal interpretation at Time: 18:12   Indication: syncope  Rate: 95  Rhythm: sinus Axis: left Other: lbbb, no STEMI, otherwise normal intervals ____________________________________________  RADIOLOGY  I personally reviewed all radiographic images ordered to evaluate for the above acute complaints and reviewed radiology reports and findings.  These findings were personally discussed with the patient.  Please see medical record for radiology report.  ____________________________________________   PROCEDURES  Procedure(s) performed:  Procedures    Critical Care performed: yes CRITICAL CARE Performed by: Willy Eddy   Total critical care time: 40 minutes  Critical care time was exclusive of separately billable procedures and treating other patients.  Critical care was necessary to treat or prevent imminent or life-threatening deterioration.  Critical care was time spent personally by me on the following activities: development of treatment plan with patient and/or surrogate as well as nursing, discussions with consultants, evaluation of patient's response to treatment, examination of patient, obtaining history from patient or surrogate, ordering and performing treatments and interventions, ordering and review of laboratory studies, ordering and review of radiographic studies, pulse oximetry and re-evaluation of patient's condition.  ____________________________________________   INITIAL IMPRESSION / ASSESSMENT AND  PLAN / ED COURSE  Pertinent labs & imaging results that were available during my care of the patient were reviewed by me and considered in my medical decision making (see chart for  details).  DDX: Dehydration, sepsis, pna, uti, hypoglycemia, cva, drug effect, withdrawal, encephalitis   [name] is a 83 y.o. who presents to the ED with above complaints. Patient with evidence of acute respiratory failure with hypoxia. No improvement after IV Narcan. Blood work does show evidence of mild leukocytosis and a drop in her hemoglobin levels from previous. Does have a left shift. Patient also with elevation of her troponin. Rectal exam with negative guaiac study. Patient does appear clinically dehydrated. Patient also found to be febrile therefore based on her service criteria patient presented from nursing facility will be started on broad-spectrum antibiotics due to her IV will need admission to the hospital for further evaluation and management.      ____________________________________________   FINAL CLINICAL IMPRESSION(S) / ED DIAGNOSES  Final diagnoses:  Altered mental status, unspecified altered mental status type  Sepsis, due to unspecified organism (HCC)  Acute respiratory failure with hypoxia (HCC)  Elevated troponin I level      NEW MEDICATIONS STARTED DURING THIS VISIT:  New Prescriptions   No medications on file     Note:  This document was prepared using Dragon voice recognition software and may include unintentional dictation errors.    Willy Eddy, MD 05/21/17 2041

## 2017-05-21 NOTE — ED Notes (Signed)
Pt cleaned up of watery, yellow stool. Dr made aware and c-diff order placed. Unable to obtain sample - had absorbed into depends and not enough for sample

## 2017-05-21 NOTE — ED Triage Notes (Signed)
Pt presents to ED via [facility]. Per EMS pt fell on Monday, pt was dx with R shoulder fracture and bilateral black eyes. Per EMS they were called out to [facility] patient is currently doing rehab because patient refused to do physical therapy today, EMS reports patient was unresponsive for staff at [facility], however on arrival to ED pt awakens with verbal stimuli but is noted to be confused, pt also has hx of dementia. EMS also reports pt is on several different pain medications as well. Pt denies any pain on arrival.

## 2017-05-21 NOTE — ED Notes (Signed)
Pt transport to 242

## 2017-05-21 NOTE — ED Notes (Signed)
Pt report received. Pt is here with mental status change. Received narcan with no change - pt is on pain meds for shoulder fx. Son at beside

## 2017-05-21 NOTE — ED Notes (Signed)
Patient transported to CT 

## 2017-05-22 LAB — CBC
HCT: 29.5 % — ABNORMAL LOW (ref 35.0–47.0)
HEMOGLOBIN: 9.3 g/dL — AB (ref 12.0–16.0)
MCH: 24.1 pg — ABNORMAL LOW (ref 26.0–34.0)
MCHC: 31.7 g/dL — ABNORMAL LOW (ref 32.0–36.0)
MCV: 76.2 fL — AB (ref 80.0–100.0)
PLATELETS: 243 10*3/uL (ref 150–440)
RBC: 3.87 MIL/uL (ref 3.80–5.20)
RDW: 14.9 % — ABNORMAL HIGH (ref 11.5–14.5)
WBC: 10.8 10*3/uL (ref 3.6–11.0)

## 2017-05-22 LAB — BASIC METABOLIC PANEL
ANION GAP: 7 (ref 5–15)
BUN: 33 mg/dL — ABNORMAL HIGH (ref 6–20)
CHLORIDE: 104 mmol/L (ref 101–111)
CO2: 27 mmol/L (ref 22–32)
CREATININE: 0.71 mg/dL (ref 0.44–1.00)
Calcium: 8.3 mg/dL — ABNORMAL LOW (ref 8.9–10.3)
GFR calc non Af Amer: >60 mL/min (ref >60)
Glucose, Bld: 297 mg/dL — ABNORMAL HIGH (ref 65–99)
Potassium: 3.7 mmol/L (ref 3.5–5.1)
SODIUM: 138 mmol/L (ref 135–145)

## 2017-05-22 LAB — PROTIME-INR
INR: 1.13
Prothrombin Time: 14.6 seconds (ref 11.4–15.2)

## 2017-05-22 LAB — APTT: APTT: 35 s (ref 24–36)

## 2017-05-22 LAB — HEPARIN LEVEL (UNFRACTIONATED): Heparin Unfractionated: <0.1 IU/mL — ABNORMAL LOW (ref 0.30–0.70)

## 2017-05-22 LAB — PROCALCITONIN

## 2017-05-22 LAB — TROPONIN I
Troponin I: 0.4 ng/mL (ref <0.03)
Troponin I: 0.29 ng/mL (ref <0.03)
Troponin I: 0.23 ng/mL (ref <0.03)

## 2017-05-22 LAB — CK: CK TOTAL: 148 U/L (ref 38–234)

## 2017-05-22 LAB — MRSA PCR SCREENING: MRSA BY PCR: NEGATIVE

## 2017-05-22 MED ORDER — HEPARIN (PORCINE) IN NACL 100-0.45 UNIT/ML-% IJ SOLN
1050.0000 [IU]/h | INTRAMUSCULAR | Status: DC
  Administered 2017-05-22: 1050 [IU]/h via INTRAVENOUS

## 2017-05-22 MED ORDER — HEPARIN BOLUS VIA INFUSION
2000.0000 [IU] | Freq: Once | INTRAVENOUS | Status: AC
  Administered 2017-05-22: 2000 [IU] via INTRAVENOUS
  Filled 2017-05-22: qty 2000

## 2017-05-22 MED ORDER — VANCOMYCIN HCL IN DEXTROSE 1-5 GM/200ML-% IV SOLN
1000.0000 mg | INTRAVENOUS | Status: DC
  Administered 2017-05-22: 1000 mg via INTRAVENOUS
  Filled 2017-05-22 (×2): qty 200

## 2017-05-22 MED ORDER — HEPARIN BOLUS VIA INFUSION
4000.0000 [IU] | Freq: Once | INTRAVENOUS | Status: AC
  Administered 2017-05-22: 4000 [IU] via INTRAVENOUS
  Filled 2017-05-22: qty 4000

## 2017-05-22 MED ORDER — ORAL CARE MOUTH RINSE
15.0000 mL | Freq: Two times a day (BID) | OROMUCOSAL | Status: DC
  Administered 2017-05-22 – 2017-05-24 (×3): 15 mL via OROMUCOSAL

## 2017-05-22 MED ORDER — HEPARIN (PORCINE) IN NACL 100-0.45 UNIT/ML-% IJ SOLN
800.0000 [IU]/h | INTRAMUSCULAR | Status: DC
  Administered 2017-05-22: 800 [IU]/h via INTRAVENOUS
  Filled 2017-05-22: qty 250

## 2017-05-22 MED ORDER — TRAMADOL HCL 50 MG PO TABS
100.0000 mg | ORAL_TABLET | ORAL | Status: DC | PRN
  Administered 2017-05-24 (×2): 100 mg via ORAL
  Filled 2017-05-22 (×2): qty 2

## 2017-05-22 NOTE — Consult Note (Signed)
[facility] Cardiology Consultation Note  Patient ID: [name], MRN: [id number], DOB/AGE: 04-02-1932 83 y.o. Admit date: 05/21/2017   Date of Consult: 05/22/2017 Primary Physician: Lauro Regulus, MD Primary Cardiologist: None  Chief Complaint:  Chief Complaint  Patient presents with  . Altered Mental Status   Reason for Consult: elevated troponin  HPI: 83 y.o. female with significant confusion and dementia and recent weeks and known previous abnormal EKG showing normal sinus rhythm with left intraventricular conduction defect. The patient has no previous cardiovascular history but has had an recent fall without evidence of syncope and humerus fracture. With this the patient also has anemia which is 4. There is been no evidence of significant chest discomfort congestive heart failure or cardiac symptoms at this time. This is unclear whether troponin elevation is due to primary cardiac issues  Past Medical History:  Diagnosis Date  . Anxiety   . Depression   . GERD (gastroesophageal reflux disease)   . Hip fracture (HCC)   . Hyperlipemia   . Neuropathy   . Vertigo       Surgical History:  Past Surgical History:  Procedure Laterality Date  . ABDOMINAL HYSTERECTOMY    . APPENDECTOMY    . BACK SURGERY     tumor removal bengin  . CHOLECYSTECTOMY       Home Meds: Prior to Admission medications   Medication Sig Start Date End Date Taking? Authorizing Provider  acetaminophen (TYLENOL) 325 MG tablet Take 650 mg by mouth every 4 (four) hours as needed.   Yes [provider]  ALPRAZolam (XANAX) 0.25 MG tablet Take 0.25 mg by mouth at bedtime as needed for anxiety.   Yes [provider]  aspirin EC 81 MG tablet Take 81 mg by mouth daily.   Yes [provider]  cetirizine (ZYRTEC) 10 MG tablet Take 10 mg by mouth daily.   Yes [provider]  Cholecalciferol (VITAMIN D3) 2000 units TABS Take 2,000 Units by mouth daily.    Yes  [provider]  Cholecalciferol 50000 units TABS Take 50,000 Units by mouth once a week. On Monday   Yes [provider]  citalopram (CELEXA) 10 MG tablet Take 10 mg by mouth daily.   Yes [provider]  clotrimazole (LOTRIMIN) 1 % cream Apply 1 application topically 2 (two) times daily.   Yes [provider]  conjugated estrogens (PREMARIN) vaginal cream Place 1 Applicatorful vaginally daily. Apply 0.5mg  (pea-sized amount)  just inside the vaginal introitus with a finger-tip every night for two weeks and then Monday, Wednesday and Friday nights. 05/10/17  Yes McGowan, Carollee Herter A, PA-C  divalproex (DEPAKOTE SPRINKLE) 125 MG capsule Take 250 mg by mouth 2 (two) times daily.   Yes [provider]  fluticasone (FLONASE) 50 MCG/ACT nasal spray Place into both nostrils daily.   Yes [provider]  gabapentin (NEURONTIN) 300 MG capsule Take 300 mg by mouth 3 (three) times daily.   Yes [provider]  glipiZIDE (GLUCOTROL) 5 MG tablet Take 2.5 mg by mouth 2 (two) times daily before a meal.    Yes [provider]  guaifenesin (ROBITUSSIN) 100 MG/5ML syrup Take 200 mg by mouth 3 (three) times daily as needed for cough.   Yes [provider]  HYDROcodone-acetaminophen (NORCO/VICODIN) 5-325 MG tablet Take 1-2 tablets by mouth every 4 (four) hours as needed for moderate pain.   Yes [provider]  ketotifen (ZADITOR) 0.025 % ophthalmic solution Place 1 drop  into both eyes 2 (two) times daily.    Yes [provider]  loperamide (IMODIUM) 2 MG capsule Take by mouth as needed for diarrhea or loose stools.   Yes [provider]  meclizine (ANTIVERT) 25 MG tablet Take 25 mg by mouth daily as needed for dizziness.    Yes [provider]  metoCLOPramide (REGLAN) 5 MG tablet Take 5 mg by mouth 4 (four) times daily.   Yes [provider]  miconazole (ZEASORB-AF) 2 % powder Apply topically as  needed for itching.   Yes [provider]  olopatadine (PATANOL) 0.1 % ophthalmic solution Place 1 drop into both eyes daily.   Yes [provider]  omeprazole (PRILOSEC) 20 MG capsule Take 20 mg by mouth daily.    Yes [provider]  ondansetron (ZOFRAN) 4 MG tablet Take 4 mg by mouth every 8 (eight) hours as needed for nausea or vomiting.   Yes [provider]  oxybutynin (DITROPAN) 5 MG tablet TAKE 1 TABLET BY MOUTH TWICE A DAY 05/06/16  Yes Defrancesco, Prentice DockerMartin A, MD  senna-docusate (SENOKOT-S) 8.6-50 MG tablet Take 2 tablets by mouth 2 (two) times daily.   Yes [provider]  traMADol (ULTRAM) 50 MG tablet Take 50 mg by mouth every 4 (four) hours as needed.    Yes [provider]    Inpatient Medications:  . aspirin EC  81 mg Oral Daily  . citalopram  10 mg Oral Daily  . divalproex  250 mg Oral BID  . gabapentin  300 mg Oral TID  . ketotifen  1 drop Both Eyes BID  . mouth rinse  15 mL Mouth Rinse BID  . metoCLOPramide  5 mg Oral QID  . pantoprazole  40 mg Oral Daily   . sodium chloride 75 mL/hr at 05/22/17 0009  . ceFEPime (MAXIPIME) IV Stopped (05/22/17 0056)  . heparin 800 Units/hr (05/22/17 0303)  . vancomycin      Allergies:  Allergies  Allergen Reactions  . Ampicillin     Zpac  . Sulfa Antibiotics     Social History   Social History  . Marital status: Widowed    Spouse name: N/A  . Number of children: N/A  . Years of education: N/A   Occupational History  . Retired    Social History Main Topics  . Smoking status: Never Smoker  . Smokeless tobacco: Never Used  . Alcohol use No  . Drug use: No  . Sexual activity: Not on file   Other Topics Concern  . Not on file   Social History Narrative  . No narrative on file     Family History  Problem Relation Age of Onset  . Diabetes Mother   . Diabetes Sister        x2  . Breast cancer Sister   . Heart disease Father   . Heart disease Brother         x3  . Bladder Cancer Neg Hx   . Kidney cancer Neg Hx   . Prostate cancer Neg Hx      Review of Systems Positive for Arm pain Negative for: General:  chills, fever, night sweats or weight changes.  Cardiovascular: PND orthopnea syncope dizziness  Dermatological skin lesions rashes Respiratory: Cough congestion Urologic: Frequent urination urination at night and hematuria Abdominal: negative for nausea, vomiting, diarrhea, bright red blood per rectum, melena, or hematemesis Neurologic: negative for visual changes, and/or hearing changes  All other systems reviewed and  are otherwise negative except as noted above.  Labs:  Recent Labs  05/21/17 1900 05/21/17 2007 05/22/17 0628  TROPONINI 0.91* 1.01* 0.40*   Lab Results  Component Value Date   WBC 10.8 05/22/2017   HGB 9.3 (L) 05/22/2017   HCT 29.5 (L) 05/22/2017   MCV 76.2 (L) 05/22/2017   PLT 243 05/22/2017    Recent Labs Lab 05/21/17 1900 05/22/17 0146  NA 138 138  K 4.3 3.7  CL 102 104  CO2 27 27  BUN 39* 33*  CREATININE 0.91 0.71  CALCIUM 8.9 8.3*  PROT 7.1  --   BILITOT 0.5  --   ALKPHOS 68  --   ALT 11*  --   AST 18  --   GLUCOSE 177* 297*   No results found for: CHOL, HDL, LDLCALC, TRIG No results found for: DDIMER  Radiology/Studies:  Ct Head Wo Contrast  Result Date: 05/21/2017 CLINICAL DATA:  Altered menorrhea status, fall EXAM: CT HEAD WITHOUT CONTRAST TECHNIQUE: Contiguous axial images were obtained from the base of the skull through the vertex without intravenous contrast. COMPARISON:  01/12/2015 FINDINGS: Brain: No acute territorial infarction, hemorrhage, or intracranial mass is seen. Atrophy and small vessel ischemic changes of the white matter. Stable ventricle size. Vascular: No hyperdense vessels.  Carotid artery calcifications. Skull: No fracture.  Small fluid in the left mastoid Sinuses/Orbits: Minimal mucosal thickening. No acute orbital abnormality. Other: None IMPRESSION: 1. No CT  evidence for acute intracranial abnormality. 2. Atrophy and small vessel ischemic changes of the white matter Electronically Signed   By: Jasmine Pang M.D.   On: 05/21/2017 20:11   Dg Chest Port 1 View  Result Date: 05/21/2017 CLINICAL DATA:  Acute respiratory failure EXAM: PORTABLE CHEST 1 VIEW COMPARISON:  06/17/2014 chest radiograph. FINDINGS: Surgical clips overlie the right mediastinum. Stable cardiomediastinal silhouette with mild cardiomegaly. No pneumothorax. No pleural effusion. Mild pulmonary edema. Mild scarring versus atelectasis in the peripheral left mid lung. Stable apparent partial resection of the right posterior sixth rib. Comminuted proximal right humerus fracture involving the surgical neck, incompletely evaluated on this study. IMPRESSION: 1. Comminuted proximal right humerus fracture involving the surgical neck, which appears acute. Recommend dedicated right shoulder radiographs for further evaluation. 2. Mild congestive heart failure. 3. Mild scarring versus atelectasis in the peripheral left mid lung. These results were called by telephone at the time of interpretation on 05/21/2017 at 7:38 pm to Dr. Willy Eddy , who verbally acknowledged these results. Electronically Signed   By: Delbert Phenix M.D.   On: 05/21/2017 19:39    EKG: Normal sinus rhythm with left intraventricular duct conduction defect  Weights: Filed Weights   05/21/17 1808 05/21/17 2338 05/22/17 0544  Weight: 86.2 kg (190 lb) 86.5 kg (190 lb 9.6 oz) 85.9 kg (189 lb 4.8 oz)     Physical Exam: Blood pressure (!) 155/61, pulse 96, temperature 98.3 F (36.8 C), temperature source Oral, resp. rate 16, height 5' (1.524 m), weight 85.9 kg (189 lb 4.8 oz), SpO2 98 %. Body mass index is 36.97 kg/m. General: Well developed, well nourished, in no acute distress. Head eyes ears nose throat: Normocephalic, atraumatic, sclera non-icteric, no xanthomas, nares are without discharge. No apparent thyromegaly and/or  mass  Lungs: Normal respiratory effort.  no wheezes, no rales, Some rhonchi.  Heart: RRR with normal S1 S2. no murmur gallop, no rub, PMI is normal size and placement, carotid upstroke normal without bruit, jugular venous pressure is normal Abdomen: Soft, non-tender,  distended  with normoactive bowel sounds. No hepatomegaly. No rebound/guarding. No obvious abdominal masses. Abdominal aorta is normal size without bruit Extremities: 1+ edema. no cyanosis, no clubbing, no ulcers  Peripheral : 2+ bilateral upper extremity pulses, 2+ bilateral femoral pulses, 2+ bilateral dorsal pedal pulse Neuro: Not Alert and oriented. No facial asymmetry. No focal deficit. Moves all extremities spontaneously. Musculoskeletal: Normal muscle tone without kyphosis Psych:  Does not Responds to questions appropriately with a normal affect.    Assessment: 83 year old female with dementia and new onset confusion multifactorial in nature with fall and no current evidence of true syncope multifactorial as well with anemia abnormal EKG and elevated troponin without evidence of symptoms of myocardial infarction or congestive heart failure  Plan: 1. Continue supportive care of new onset of extra confusion on top of dementia 2. No further cardiac workup or diagnostics necessary of fall and elevated troponin of unclear etiology without evidence of true heart failure or anginal symptoms 3. No restrictions to rehabilitation for humerus fracture 4. Continue supportive care and treatment of anemia  Signed, Lamar Blinks M.D. [facility] 05/22/2017, 7:24 AM

## 2017-05-22 NOTE — Clinical Social Work Note (Signed)
Clinical Social Work Assessment  Patient Details  Name: [name] MRN: [id number] Date of Birth: 12/05/1931  Date of referral:  05/22/17               Reason for consult:  Facility Placement                Permission sought to share information with:    Permission granted to share information::  Yes, Verbal Permission Granted  Name::        Agency::     Relationship::     Contact Information:     Housing/Transportation Living arrangements for the past 2 months:  Assisted Living Facility Source of Information:  Facility Patient Interpreter Needed:  None Criminal Activity/Legal Involvement Pertinent to Current Situation/Hospitalization:  No - Comment as needed Significant Relationships:  Adult Children Lives with:  Facility Resident Do you feel safe going back to the place where you live?  Yes Need for family participation in patient care:  Yes (Comment)  Care giving concerns:  Patient admitted from [facility]   Social Worker assessment / plan:  The patient currently has AMS and cannot participate in assessment. The CSW attempted to call her son with no answer; CSW left voicemail. The CSW contacted the admissions coordinator for [facility] Verlon Au(Leslie) to gain information. The patient is a resident of the ALF side of [facility] and can return when stable. The patient is confused at baseline, and mentation did not clear with administration of Narcan as rule out for narcotic/benzodiazepine stupor. The patient was found to have sepsis and will have IV ABX until stable for discharge. Discharge date is unknown at this time. CSW will continue to follow for discharge facilitation.  Employment status:  Retired Health and safety inspectornsurance information:  [place] PT Recommendations:  Not assessed at this time Information / Referral to community resources:     Patient/Family's Response to care:  The patient's son was unreachable.  Patient/Family's Understanding of and Emotional  Response to Diagnosis, Current Treatment, and Prognosis:  The patient's son was unreachable.  Emotional Assessment Appearance:  Appears stated age Attitude/Demeanor/Rapport:   (Patient has AMS) Affect (typically observed):   (Patient has AMS) Orientation:  Oriented to Self Alcohol / Substance use:  Never Used Psych involvement (Current and /or in the community):  No (Comment)  Discharge Needs  Concerns to be addressed:  Care Coordination, Discharge Planning Concerns Readmission within the last 30 days:  No Current discharge risk:  Chronically ill Barriers to Discharge:  Continued Medical Work up   UAL CorporationKaren M Ranell Finelli, LCSW 05/22/2017, 11:47 AM

## 2017-05-22 NOTE — Progress Notes (Signed)
ANTICOAGULATION CONSULT NOTE -follow up Consult  Pharmacy Consult for heparin drip Indication: chest pain/ACS  Allergies  Allergen Reactions  . Ampicillin     Zpac  . Sulfa Antibiotics     Patient Measurements: Height: 5' (152.4 cm) Weight: 189 lb 4.8 oz (85.9 kg) IBW/kg (Calculated) : 45.5 Heparin Dosing Weight: 66 kg  Vital Signs: Temp: 98.4 F (36.9 C) (07/15 0942) Temp Source: Oral (07/15 0942) BP: 157/69 (07/15 0942) Pulse Rate: 100 (07/15 0942)  Labs:  Recent Labs  05/21/17 1900 05/21/17 2007 05/22/17 0146 05/22/17 0628 05/22/17 1127  HGB 10.4*  --  9.3*  --   --   HCT 33.2*  --  29.5*  --   --   PLT 294  --  243  --   --   APTT  --   --  35  --   --   LABPROT  --   --  14.6  --   --   INR  --   --  1.13  --   --   HEPARINUNFRC  --   --   --   --  <0.10*  CREATININE 0.91  --  0.71  --   --   CKTOTAL  --   --   --   --  148  TROPONINI 0.91* 1.01*  --  0.40* 0.29*    Estimated Creatinine Clearance: 52.8 mL/min (by C-G formula based on SCr of 0.71 mg/dL).   Medical History: Past Medical History:  Diagnosis Date  . Anxiety   . Depression   . GERD (gastroesophageal reflux disease)   . Hip fracture (HCC)   . Hyperlipemia   . Neuropathy   . Vertigo     Medications:  No anticoagulation in PTA meds  Assessment:  Goal of Therapy:  Heparin level 0.3-0.7 units/ml Monitor platelets by anticoagulation protocol: Yes   Plan:  4000 unit bolus and initial rate of 800 units/hr. First heparin level 8 hours after start of infusion.   7/15 ~ 11:30 HL <0.10.  Ordered a bolus of 2000 units and increased drip rate to 1050 units/hr.  Will recheck HL in 8 hours at 21:00.  Gabbie Marzo K, RPH 05/22/2017,12:50 PM

## 2017-05-22 NOTE — Progress Notes (Signed)
ANTICOAGULATION CONSULT NOTE - Initial Consult  Pharmacy Consult for heparin drip Indication: chest pain/ACS  Allergies  Allergen Reactions  . Ampicillin     Zpac  . Sulfa Antibiotics     Patient Measurements: Height: 5' (152.4 cm) Weight: 190 lb 9.6 oz (86.5 kg) IBW/kg (Calculated) : 45.5 Heparin Dosing Weight: 66 kg  Vital Signs: Temp: 98.7 F (37.1 C) (07/14 2338) Temp Source: Oral (07/14 2338) BP: 118/98 (07/14 2338) Pulse Rate: 97 (07/14 2338)  Labs:  Recent Labs  05/21/17 1900 05/21/17 2007 05/22/17 0146  HGB 10.4*  --  9.3*  HCT 33.2*  --  29.5*  PLT 294  --  243  APTT  --   --  35  LABPROT  --   --  14.6  INR  --   --  1.13  CREATININE 0.91  --  0.71  TROPONINI 0.91* 1.01*  --     Estimated Creatinine Clearance: 53 mL/min (by C-G formula based on SCr of 0.71 mg/dL).   Medical History: Past Medical History:  Diagnosis Date  . Anxiety   . Depression   . GERD (gastroesophageal reflux disease)   . Hip fracture (HCC)   . Hyperlipemia   . Neuropathy   . Vertigo     Medications:  No anticoagulation in PTA meds  Assessment:  Goal of Therapy:  Heparin level 0.3-0.7 units/ml Monitor platelets by anticoagulation protocol: Yes   Plan:  4000 unit bolus and initial rate of 800 units/hr. First heparin level 8 hours after start of infusion.  Miyo Aina S 05/22/2017,2:36 AM

## 2017-05-22 NOTE — NC FL2 (Deleted)
[place] MEDICAID FL2 LEVEL OF CARE SCREENING TOOL     IDENTIFICATION  Patient Name: [name] Birthdate: 1932-09-11 Sex: female Admission Date (Current Location): 05/21/2017  [place] Number:  Chiropodist and Address:  [facility], [address], [place], [place] [address]      Provider Number: [id number]  Attending Physician Name and Address:  Auburn Bilberry, MD  Relative Name and Phone Number:       Current Level of Care: Hospital Recommended Level of Care: Skilled Nursing Facility Prior Approval Number:    Date Approved/Denied:   PASRR Number: [id number]  Discharge Plan: SNF     Current Diagnoses: Patient Active Problem List   Diagnosis Date Noted  . Sepsis (HCC) 05/21/2017  . Aspiration pneumonia (HCC) 05/21/2017  . GERD (gastroesophageal reflux disease) 05/21/2017  . HLD (hyperlipidemia) 05/21/2017  . Depression 05/21/2017  . Anxiety 05/21/2017  . Elevated troponin 05/21/2017  . Diabetes (HCC) 05/21/2017  . Right humeral fracture 05/21/2017  . Nausea and vomiting 03/31/2015  . Microcytic anemia 03/31/2015    Orientation RESPIRATION BLADDER Height & Weight     Self  O2 (3L o2) Incontinent Weight: 189 lb 4.8 oz (85.9 kg) Height:  5' (152.4 cm)  BEHAVIORAL SYMPTOMS/MOOD NEUROLOGICAL BOWEL NUTRITION STATUS      Incontinent Diet (Heart Healthy)  AMBULATORY STATUS COMMUNICATION OF NEEDS Skin   Supervision Verbally Normal                       Personal Care Assistance Level of Assistance  Bathing, Feeding, Dressing Bathing Assistance: Maximum assistance Feeding assistance: Limited assistance Dressing Assistance: Maximum assistance     Functional Limitations Info             SPECIAL CARE FACTORS FREQUENCY                       Contractures Contractures Info: Present    Additional Factors Info  Code Status, Allergies Code Status Info: DNR Allergies Info: Ampicillin, Sulfa  Antibiotics           Current Medications (05/22/2017):  This is the current hospital active medication list Current Facility-Administered Medications  Medication Dose Route Frequency Provider Last Rate Last Dose  . acetaminophen (TYLENOL) tablet 650 mg  650 mg Oral Q6H PRN Oralia Manis, MD       Or  . acetaminophen (TYLENOL) suppository 650 mg  650 mg Rectal Q6H PRN Oralia Manis, MD      . aspirin EC tablet 81 mg  81 mg Oral Daily Oralia Manis, MD   81 mg at 05/22/17 1100  . ceFEPIme (MAXIPIME) 2 g in dextrose 5 % 50 mL IVPB  2 g Intravenous Q12H Oralia Manis, MD   Stopped at 05/22/17 0056  . citalopram (CELEXA) tablet 10 mg  10 mg Oral Daily Oralia Manis, MD   10 mg at 05/22/17 1100  . divalproex (DEPAKOTE SPRINKLE) capsule 250 mg  250 mg Oral BID Oralia Manis, MD   250 mg at 05/22/17 1100  . gabapentin (NEURONTIN) capsule 300 mg  300 mg Oral TID Oralia Manis, MD   300 mg at 05/22/17 1100  . heparin ADULT infusion 100 units/mL (25000 units/256mL sodium chloride 0.45%)  800 Units/hr Intravenous Continuous Oralia Manis, MD 8 mL/hr at 05/22/17 0303 800 Units/hr at 05/22/17 0303  . ketotifen (ZADITOR) 0.025 % ophthalmic solution 1 drop  1 drop Both Eyes BID Oralia Manis, MD  1 drop at 05/22/17 1102  . MEDLINE mouth rinse  15 mL Mouth Rinse BID Oralia ManisWillis, David, MD   15 mL at 05/22/17 0145  . metoCLOPramide (REGLAN) tablet 5 mg  5 mg Oral QID Oralia ManisWillis, David, MD   5 mg at 05/22/17 1100  . ondansetron (ZOFRAN) tablet 4 mg  4 mg Oral Q6H PRN Oralia ManisWillis, David, MD       Or  . ondansetron [facility](ZOFRAN) injection 4 mg  4 mg Intravenous Q6H PRN Oralia ManisWillis, David, MD      . pantoprazole (PROTONIX) EC tablet 40 mg  40 mg Oral Daily Oralia ManisWillis, David, MD   40 mg at 05/22/17 1100  . traMADol (ULTRAM) tablet 50 mg  50 mg Oral Q4H PRN Oralia ManisWillis, David, MD   50 mg at 05/22/17 1058  . vancomycin (VANCOCIN) IVPB 1000 mg/200 mL premix  1,000 mg Intravenous Q24H Oralia ManisWillis, David, MD         Discharge Medications: Please see  discharge summary for a list of discharge medications.  Relevant Imaging Results:  Relevant Lab Results:   Additional Information SS# [id number]  Judi CongKaren M White, LCSW

## 2017-05-22 NOTE — Progress Notes (Signed)
Pharmacy Antibiotic Note  Lunette Laury Deepell Katen is a 83 y.o. female admitted on 05/21/2017 with pneumonia and sepsis.  Pharmacy has been consulted for vancomycin and cefepime dosing.  Plan: DW 62kg  Vd 43L kei 0.043 hr-1  T1/2 16 hours Vancomycin 1 gram q 24 hours ordered with stacked dosing. Level before 5th dose. Goal trough 15-20.  Cefepime 2 grams q 12 hours ordered.  Height: 5' (152.4 cm) Weight: 190 lb 9.6 oz (86.5 kg) IBW/kg (Calculated) : 45.5  Temp (24hrs), Avg:99.3 F (37.4 C), Min:98.5 F (36.9 C), Max:100.8 F (38.2 C)   Recent Labs Lab 05/21/17 1900 05/21/17 2005  WBC 12.1*  --   CREATININE 0.91  --   LATICACIDVEN  --  1.1    Estimated Creatinine Clearance: 46.6 mL/min (by C-G formula based on SCr of 0.91 mg/dL).    Allergies  Allergen Reactions  . Ampicillin     Zpac  . Sulfa Antibiotics     Antimicrobials this admission: vancomycin vefepime 7/15 >>    >>   Dose adjustments this admission:   Microbiology results: 7/14 BCx: pending 7/14 UCx: pending  7/15 MRSA PCR: pending      7/14 CXR: scarring versus atelectasis in the peripheral left mid lung 7/14 UA: (-)  Thank you for allowing pharmacy to be a part of this patient's care.  Christipher Rieger S 05/22/2017 1:59 AM

## 2017-05-22 NOTE — Progress Notes (Signed)
[facility] - [place] at [facility]                                                                                                                                                                                  Patient Demographics   [name], is a 83 y.o. female, DOB - [birth year], XBJ:[id number]  Admit date - 05/21/2017   Admitting Physician Oralia Manis, MD  Outpatient Primary MD for the patient is Lauro Regulus, MD   LOS - 1  Subjective: Patient currently is confused and unable to provide me any review of systems states that she's fine    Review of Systems:   CONSTITUTIONAL: Unable to provide any review of systems   Vitals:   Vitals:   05/21/17 2230 05/21/17 2338 05/22/17 0544 05/22/17 0942  BP: (!) 113/47 (!) 118/98 (!) 155/61 (!) 157/69  Pulse: 99 97 96 100  Resp: (!) 23 18 16  (!) 22  Temp:  98.7 F (37.1 C) 98.3 F (36.8 C) 98.4 F (36.9 C)  TempSrc:  Oral Oral Oral  SpO2: 98% 99% 98% 99%  Weight:  190 lb 9.6 oz (86.5 kg) 189 lb 4.8 oz (85.9 kg)   Height:        Wt Readings from Last 3 Encounters:  05/22/17 189 lb 4.8 oz (85.9 kg)  05/10/17 190 lb (86.2 kg)  05/01/15 193 lb (87.5 kg)     Intake/Output Summary (Last 24 hours) at 05/22/17 1315 Last data filed at 05/22/17 0405  Gross per 24 hour  Intake           553.27 ml  Output                0 ml  Net           553.27 ml    Physical Exam:   GENERAL: Morbidly obese  HEAD, EYES, EARS, NOSE AND THROAT: Atraumatic, normocephalic. Extraocular muscles are intact. Pupils equal and reactive to light. Sclerae anicteric. No conjunctival injection. No oro-pharyngeal erythema.  NECK: Supple. There is no jugular venous distention. No bruits, no lymphadenopathy, no thyromegaly.  HEART: Regular rate and rhythm,. No murmurs, no rubs, no clicks.  LUNGS: Clear to auscultation bilaterally. No rales or rhonchi. No wheezes.  ABDOMEN: Soft, flat, nontender, nondistended. Has good bowel sounds. No  hepatosplenomegaly appreciated.  EXTREMITIES: Sling in place NEUROLOGIC: The patient is confused SKIN: Moist and warm with no rashes appreciated.  Psych: Not anxious, depressed LN: No inguinal LN enlargement    Antibiotics   Anti-infectives    Start     Dose/Rate Route Frequency Ordered Stop   05/22/17 1200  vancomycin (VANCOCIN) IVPB 1000 mg/200  mL premix     1,000 mg 200 mL/hr over 60 Minutes Intravenous Every 24 hours 05/22/17 0158     05/22/17 0000  vancomycin (VANCOCIN) IVPB 1000 mg/200 mL premix     1,000 mg 200 mL/hr over 60 Minutes Intravenous  Once 05/21/17 2347 05/22/17 0156   05/22/17 0000  ceFEPIme (MAXIPIME) 2 g in dextrose 5 % 50 mL IVPB     2 g 100 mL/hr over 30 Minutes Intravenous Every 12 hours 05/21/17 2347     05/21/17 2000  cefTRIAXone (ROCEPHIN) 1 g in dextrose 5 % 50 mL IVPB     1 g 100 mL/hr over 30 Minutes Intravenous  Once 05/21/17 1949 05/21/17 2120   05/21/17 2000  azithromycin (ZITHROMAX) 500 mg in dextrose 5 % 250 mL IVPB     500 mg 250 mL/hr over 60 Minutes Intravenous  Once 05/21/17 1949 05/21/17 2247      Medications   Scheduled Meds: . aspirin EC  81 mg Oral Daily  . citalopram  10 mg Oral Daily  . divalproex  250 mg Oral BID  . gabapentin  300 mg Oral TID  . ketotifen  1 drop Both Eyes BID  . mouth rinse  15 mL Mouth Rinse BID  . metoCLOPramide  5 mg Oral QID  . pantoprazole  40 mg Oral Daily   Continuous Infusions: . ceFEPime (MAXIPIME) IV 2 g (05/22/17 1252)  . vancomycin 1,000 mg (05/22/17 1259)   PRN Meds:.acetaminophen **OR** acetaminophen, ondansetron **OR** ondansetron (ZOFRAN) IV, traMADol   Data Review:   Micro Results Recent Results (from the past 240 hour(s))  Blood Culture (routine x 2)     Status: None (Preliminary result)   Collection Time: 05/21/17  8:07 PM  Result Value Ref Range Status   Specimen Description BLOOD LEFT ANTECUBITAL  Final   Special Requests   Final    BOTTLES DRAWN AEROBIC AND ANAEROBIC Blood  Culture adequate volume   Culture NO GROWTH < 12 HOURS  Final   Report Status PENDING  Incomplete  Blood Culture (routine x 2)     Status: None (Preliminary result)   Collection Time: 05/21/17  8:27 PM  Result Value Ref Range Status   Specimen Description BLOOD LEFT ANTECUBITAL  Final   Special Requests   Final    BOTTLES DRAWN AEROBIC AND ANAEROBIC Blood Culture adequate volume   Culture NO GROWTH < 12 HOURS  Final   Report Status PENDING  Incomplete  MRSA PCR Screening     Status: None   Collection Time: 05/22/17  2:25 AM  Result Value Ref Range Status   MRSA by PCR NEGATIVE NEGATIVE Final    Comment:        The GeneXpert MRSA Assay (FDA approved for NASAL specimens only), is one component of a comprehensive MRSA colonization surveillance program. It is not intended to diagnose MRSA infection nor to guide or monitor treatment for MRSA infections.     Radiology Reports Ct Head Wo Contrast  Result Date: 05/21/2017 CLINICAL DATA:  Altered menorrhea status, fall EXAM: CT HEAD WITHOUT CONTRAST TECHNIQUE: Contiguous axial images were obtained from the base of the skull through the vertex without intravenous contrast. COMPARISON:  01/12/2015 FINDINGS: Brain: No acute territorial infarction, hemorrhage, or intracranial mass is seen. Atrophy and small vessel ischemic changes of the white matter. Stable ventricle size. Vascular: No hyperdense vessels.  Carotid artery calcifications. Skull: No fracture.  Small fluid in the left mastoid Sinuses/Orbits: Minimal mucosal thickening. No acute  orbital abnormality. Other: None IMPRESSION: 1. No CT evidence for acute intracranial abnormality. 2. Atrophy and small vessel ischemic changes of the white matter Electronically Signed   By: Jasmine PangKim  Fujinaga M.D.   On: 05/21/2017 20:11   Dg Chest Port 1 View  Result Date: 05/21/2017 CLINICAL DATA:  Acute respiratory failure EXAM: PORTABLE CHEST 1 VIEW COMPARISON:  06/17/2014 chest radiograph. FINDINGS:  Surgical clips overlie the right mediastinum. Stable cardiomediastinal silhouette with mild cardiomegaly. No pneumothorax. No pleural effusion. Mild pulmonary edema. Mild scarring versus atelectasis in the peripheral left mid lung. Stable apparent partial resection of the right posterior sixth rib. Comminuted proximal right humerus fracture involving the surgical neck, incompletely evaluated on this study. IMPRESSION: 1. Comminuted proximal right humerus fracture involving the surgical neck, which appears acute. Recommend dedicated right shoulder radiographs for further evaluation. 2. Mild congestive heart failure. 3. Mild scarring versus atelectasis in the peripheral left mid lung. These results were called by telephone at the time of interpretation on 05/21/2017 at 7:38 pm to Dr. Willy EddyPATRICK ROBINSON , who verbally acknowledged these results. Electronically Signed   By: Delbert PhenixJason A Poff M.D.   On: 05/21/2017 19:39     CBC  Recent Labs Lab 05/21/17 1900 05/22/17 0146  WBC 12.1* 10.8  HGB 10.4* 9.3*  HCT 33.2* 29.5*  PLT 294 243  MCV 77.3* 76.2*  MCH 24.2* 24.1*  MCHC 31.4* 31.7*  RDW 14.7* 14.9*  LYMPHSABS 1.3  --   MONOABS 1.1*  --   EOSABS 0.1  --   BASOSABS 0.1  --     Chemistries   Recent Labs Lab 05/21/17 1900 05/22/17 0146  NA 138 138  K 4.3 3.7  CL 102 104  CO2 27 27  GLUCOSE 177* 297*  BUN 39* 33*  CREATININE 0.91 0.71  CALCIUM 8.9 8.3*  AST 18  --   ALT 11*  --   ALKPHOS 68  --   BILITOT 0.5  --    ------------------------------------------------------------------------------------------------------------------ estimated creatinine clearance is 52.8 mL/min (by C-G formula based on SCr of 0.71 mg/dL). ------------------------------------------------------------------------------------------------------------------ No results for input(s): HGBA1C in the last 72  hours. ------------------------------------------------------------------------------------------------------------------ No results for input(s): CHOL, HDL, LDLCALC, TRIG, CHOLHDL, LDLDIRECT in the last 72 hours. ------------------------------------------------------------------------------------------------------------------ No results for input(s): TSH, T4TOTAL, T3FREE, THYROIDAB in the last 72 hours.  Invalid input(s): FREET3 ------------------------------------------------------------------------------------------------------------------ No results for input(s): VITAMINB12, FOLATE, FERRITIN, TIBC, IRON, RETICCTPCT in the last 72 hours.  Coagulation profile  Recent Labs Lab 05/22/17 0146  INR 1.13    No results for input(s): DDIMER in the last 72 hours.  Cardiac Enzymes  Recent Labs Lab 05/21/17 2007 05/22/17 0628 05/22/17 1127  TROPONINI 1.01* 0.40* 0.29*   ------------------------------------------------------------------------------------------------------------------ Invalid input(s): POCBNP    Assessment & Plan  Patient is a 83 year old with recent right humeral fracture  1. Sepsis (HCC) ruled out - lactate within normal limits, patient is hemodynamically stable,  2. Possible Aspiration pneumonia (HCC) - patient's chest x-ray shows possible atelectasis I am not 100% sure she has pneumonia we'll continue antibiotics for now 3. Elevated troponin - this is due to demand ischemia cardiology has seen the patient, echocardiogram pending 4. Right humeral fracture - orthopedic consult recommends sling 5.  Anxiety - continue home meds 6.  GERD (gastroesophageal reflux disease) - home dose PPI 7.  HLD (hyperlipidemia) - home dose anti lipid 8. Depression - home dose antidepressant     Code Status Orders        Start  Ordered   05/21/17 2339  Do not attempt resuscitation (DNR)  Continuous    Question Answer Comment  In the event of cardiac or respiratory ARREST  Do not call a "code blue"   In the event of cardiac or respiratory ARREST Do not perform Intubation, CPR, defibrillation or ACLS   In the event of cardiac or respiratory ARREST Use medication by any route, position, wound care, and other measures to relive pain and suffering. May use oxygen, suction and manual treatment of airway obstruction as needed for comfort.      05/21/17 2338    Code Status History    Date Active Date Inactive Code Status Order ID Comments User Context   This patient has a current code status but no historical code status.    Advance Directive Documentation     Most Recent Value  Type of Advance Directive  Out of facility DNR (pink MOST or yellow form)  Pre-existing out of facility DNR order (yellow form or pink MOST form)  Yellow form placed in chart (order not valid for inpatient use)  "MOST" Form in Place?  -           Consults  ortho  DVT Prophylaxis  Lovenox   Lab Results  Component Value Date   PLT 243 05/22/2017     Time Spent in minutes   Greater than 50% of time spent in care coordination and counseling patient regarding the condition and plan of care.   Auburn Bilberry M.D on 05/22/2017 at 1:15 PM  Between 7am to 6pm - Pager - [phone]  After 6pm go to [email or web address] - password [email or web address] [facility]  [facility]   Office  [phone]

## 2017-05-22 NOTE — Consult Note (Signed)
Patient is seen for evaluation of right shoulder pain. She had a chest x-ray that showed a proximal humerus fracture. 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 Sensation is intact to the hand and with intact motor function to the radial median and ulnar nerves. Her x-rays here show no further displacement. Impression is nondisplaced proximal humerus fracture Plan is continue sling she R he has no appointment for follow-up with [facility]

## 2017-05-22 NOTE — Clinical Social Work Note (Signed)
CSW spoke with the patient's son who confirmed that the patient is from [facility] and would like her to return when stable.  Argentina PonderKaren Martha Davone Shinault, MSW, Theresia MajorsLCSWA [phone]

## 2017-05-22 NOTE — Clinical Social Work Note (Signed)
CSW viewed in chart review that patient has admitted from [facility]. CSW will assess when able.  Argentina PonderKaren Martha Carmela Piechowski, MSW, Theresia MajorsLCSWA [phone]

## 2017-05-23 ENCOUNTER — Inpatient Hospital Stay (HOSPITAL_COMMUNITY)
Admit: 2017-05-23 | Discharge: 2017-05-23 | Disposition: A | Discharge status: Home / Self Care | Payer: Medicare (Managed Care) | Attending: Internal Medicine | Admitting: Internal Medicine

## 2017-05-23 ENCOUNTER — Inpatient Hospital Stay: Admit: 2017-05-23 | Payer: Medicare (Managed Care)

## 2017-05-23 DIAGNOSIS — R7989 Other specified abnormal findings of blood chemistry: Secondary | ICD-10-CM

## 2017-05-23 LAB — URINE CULTURE: Culture: NO GROWTH

## 2017-05-23 MED ORDER — LEVOFLOXACIN 500 MG PO TABS
500.0000 mg | ORAL_TABLET | Freq: Every day | ORAL | Status: DC
  Administered 2017-05-23 – 2017-05-24 (×2): 500 mg via ORAL
  Filled 2017-05-23 (×2): qty 1

## 2017-05-23 MED ORDER — LEVOFLOXACIN IN D5W 500 MG/100ML IV SOLN
500.0000 mg | Freq: Every day | INTRAVENOUS | Status: DC
  Filled 2017-05-23: qty 100

## 2017-05-23 MED ORDER — ENOXAPARIN SODIUM 40 MG/0.4ML ~~LOC~~ SOLN
40.0000 mg | Freq: Every day | SUBCUTANEOUS | Status: DC
  Administered 2017-05-23 – 2017-05-24 (×2): 40 mg via SUBCUTANEOUS
  Filled 2017-05-23 (×2): qty 0.4

## 2017-05-23 NOTE — Progress Notes (Signed)
[facility]  Patient: [name] / Admit Date: 05/21/2017 / Date of Encounter: 05/23/2017, 8:00 AM   Subjective: No significant change in current condition of confusion and dementia. No evidence of significant worsening shortness of breath although the patient is not ambulating well. She is on no particular medication management from the cardiac standpoint with a troponin elevation of unknown etiology and possible demand ischemia  Review of Systems: Positive for: Shortness of breath Negative for: Vision change, hearing change, syncope, dizziness, nausea, vomiting,diarrhea, bloody stool, stomach pain, cough, congestion, diaphoresis, urinary frequency, urinary pain,skin lesions, skin rashes Others previously listed  Objective: Telemetry: Normal sinus rhythm Physical Exam: Blood pressure (!) 150/51, pulse 83, temperature 98.6 F (37 C), temperature source Oral, resp. rate 18, height 5' (1.524 m), weight 88 kg (193 lb 14.4 oz), SpO2 90 %. Body mass index is 37.87 kg/m. General: Well developed, well nourished, in no acute distress. Head: Normocephalic, atraumatic, sclera non-icteric, no xanthomas, nares are without discharge. Neck: No apparent masses Lungs: Normal respirations with no wheezes, some rhonchi, no rales , no crackles   Heart: Regular rate and rhythm, normal S1 S2, no murmur, no rub, no gallop, PMI is normal size and placement, carotid upstroke normal without bruit, jugular venous pressure normal Abdomen: Soft, non-tender distended with normoactive bowel sounds. No hepatosplenomegaly. Abdominal aorta is normal size without bruit Extremities: 1+ edema, no clubbing, no cyanosis, no ulcers,  Peripheral: 2+ radial, 2+ femoral, 2+ dorsal pedal pulses Neuro: Not Alert and oriented. Moves all extremities spontaneously. Psych:  Does not Responds to questions appropriately with a normal affect.   Intake/Output Summary (Last 24 hours) at 05/23/17  0800 Last data filed at 05/22/17 1700  Gross per 24 hour  Intake           162.03 ml  Output                0 ml  Net           162.03 ml    Inpatient Medications:  . aspirin EC  81 mg Oral Daily  . citalopram  10 mg Oral Daily  . divalproex  250 mg Oral BID  . gabapentin  300 mg Oral TID  . ketotifen  1 drop Both Eyes BID  . mouth rinse  15 mL Mouth Rinse BID  . metoCLOPramide  5 mg Oral QID  . pantoprazole  40 mg Oral Daily   Infusions:  . ceFEPime (MAXIPIME) IV Stopped (05/22/17 2331)  . vancomycin Stopped (05/22/17 1508)    Labs:  Recent Labs  05/21/17 1900 05/22/17 0146  NA 138 138  K 4.3 3.7  CL 102 104  CO2 27 27  GLUCOSE 177* 297*  BUN 39* 33*  CREATININE 0.91 0.71  CALCIUM 8.9 8.3*    Recent Labs  05/21/17 1900  AST 18  ALT 11*  ALKPHOS 68  BILITOT 0.5  PROT 7.1  ALBUMIN 2.7*    Recent Labs  05/21/17 1900 05/22/17 0146  WBC 12.1* 10.8  NEUTROABS 9.5*  --   HGB 10.4* 9.3*  HCT 33.2* 29.5*  MCV 77.3* 76.2*  PLT 294 243    Recent Labs  05/21/17 2007 05/22/17 0628 05/22/17 1127 05/22/17 1750  CKTOTAL  --   --  148  --   TROPONINI 1.01* 0.40* 0.29* 0.23*   Invalid input(s): POCBNP No results for input(s): HGBA1C in the last 72 hours.   Weights: [employer]   05/21/17 2338 05/22/17  1610 05/23/17 0356  Weight: 86.5 kg (190 lb 9.6 oz) 85.9 kg (189 lb 4.8 oz) 88 kg (193 lb 14.4 oz)     Radiology/Studies:  Ct Head Wo Contrast  Result Date: 05/21/2017 CLINICAL DATA:  Altered menorrhea status, fall EXAM: CT HEAD WITHOUT CONTRAST TECHNIQUE: Contiguous axial images were obtained from the base of the skull through the vertex without intravenous contrast. COMPARISON:  01/12/2015 FINDINGS: Brain: No acute territorial infarction, hemorrhage, or intracranial mass is seen. Atrophy and small vessel ischemic changes of the white matter. Stable ventricle size. Vascular: No hyperdense vessels.  Carotid artery calcifications. Skull: No fracture.   Small fluid in the left mastoid Sinuses/Orbits: Minimal mucosal thickening. No acute orbital abnormality. Other: None IMPRESSION: 1. No CT evidence for acute intracranial abnormality. 2. Atrophy and small vessel ischemic changes of the white matter Electronically Signed   By: Jasmine Pang M.D.   On: 05/21/2017 20:11   Dg Chest Port 1 View  Result Date: 05/21/2017 CLINICAL DATA:  Acute respiratory failure EXAM: PORTABLE CHEST 1 VIEW COMPARISON:  06/17/2014 chest radiograph. FINDINGS: Surgical clips overlie the right mediastinum. Stable cardiomediastinal silhouette with mild cardiomegaly. No pneumothorax. No pleural effusion. Mild pulmonary edema. Mild scarring versus atelectasis in the peripheral left mid lung. Stable apparent partial resection of the right posterior sixth rib. Comminuted proximal right humerus fracture involving the surgical neck, incompletely evaluated on this study. IMPRESSION: 1. Comminuted proximal right humerus fracture involving the surgical neck, which appears acute. Recommend dedicated right shoulder radiographs for further evaluation. 2. Mild congestive heart failure. 3. Mild scarring versus atelectasis in the peripheral left mid lung. These results were called by telephone at the time of interpretation on 05/21/2017 at 7:38 pm to Dr. Willy Eddy , who verbally acknowledged these results. Electronically Signed   By: Delbert Phenix M.D.   On: 05/21/2017 19:39     Assessment and Recommendation  83 y.o. female with humerus fracture on the left side with significant confusion dementia shortness of breath and concerns of elevated troponin more consistent with demand ischemia rather than acute coronary syndrome and abnormal EKG without evidence of significant chest pain angina and/or congestive heart failure at this time 1. No further cardiac workup or diagnostic testing necessary at this time for minimal elevation of troponin most consistent with demand ischemia 2. Proceed to  rehabilitation and other treatment options as necessary 3. Call if further questions  Signed, Arnoldo Hooker M.D. FACC

## 2017-05-23 NOTE — Progress Notes (Signed)
[facility] [place] [facility]                                                                                                                                                                                  Patient Demographics   [name], is a 83 y.o. female, DOB - [birth year], ZOX:[id number]  Admit date - 05/21/2017   Admitting Physician Oralia Manis, MD  Outpatient Primary MD for the patient is Lauro Regulus, MD   LOS - 2  Subjective: Patient is confused but doing better denies any chest pain or shortness of breath    Review of Systems:   CONSTITUTIONAL: Unable to provide any review of systems   Vitals:   Vitals:   05/22/17 0942 05/23/17 0356 05/23/17 0957 05/23/17 1209  BP: (!) 157/69 (!) 150/51 (!) 162/70 (!) 159/50  Pulse: 100 83 89 83  Resp: (!) 22 18 18 19   Temp: 98.4 F (36.9 C) 98.6 F (37 C) 98.9 F (37.2 C) 98.3 F (36.8 C)  TempSrc: Oral Oral Oral   SpO2: 99% 90% 97% 97%  Weight:  193 lb 14.4 oz (88 kg)    Height:        Wt Readings from Last 3 Encounters:  05/23/17 193 lb 14.4 oz (88 kg)  05/10/17 190 lb (86.2 kg)  05/01/15 193 lb (87.5 kg)     Intake/Output Summary (Last 24 hours) at 05/23/17 1447 Last data filed at 05/22/17 1700  Gross per 24 hour  Intake           162.03 ml  Output                0 ml  Net           162.03 ml    Physical Exam:   GENERAL: Morbidly obese  HEAD, EYES, EARS, NOSE AND THROAT: Atraumatic, normocephalic. Extraocular muscles are intact. Pupils equal and reactive to light. Sclerae anicteric. No conjunctival injection. No oro-pharyngeal erythema.  NECK: Supple. There is no jugular venous distention. No bruits, no lymphadenopathy, no thyromegaly.  HEART: Regular rate and rhythm,. No murmurs, no rubs, no clicks.  LUNGS: Clear to auscultation bilaterally. No rales or rhonchi. No wheezes.  ABDOMEN: Soft, flat, nontender, nondistended. Has good bowel sounds. No hepatosplenomegaly appreciated.   EXTREMITIES: Sling in place NEUROLOGIC: The patient is confused SKIN: Moist and warm with no rashes appreciated.  Psych: Not anxious, depressed LN: No inguinal LN enlargement    Antibiotics   Anti-infectives    Start     Dose/Rate Route Frequency Ordered Stop   05/22/17 1200  vancomycin (VANCOCIN) IVPB 1000 mg/200 mL premix  Status:  Discontinued  1,000 mg 200 mL/hr over 60 Minutes Intravenous Every 24 hours 05/22/17 0158 05/23/17 0946   05/22/17 0000  vancomycin (VANCOCIN) IVPB 1000 mg/200 mL premix     1,000 mg 200 mL/hr over 60 Minutes Intravenous  Once 05/21/17 2347 05/22/17 0156   05/22/17 0000  ceFEPIme (MAXIPIME) 2 g in dextrose 5 % 50 mL IVPB     2 g 100 mL/hr over 30 Minutes Intravenous Every 12 hours 05/21/17 2347     05/21/17 2000  cefTRIAXone (ROCEPHIN) 1 g in dextrose 5 % 50 mL IVPB     1 g 100 mL/hr over 30 Minutes Intravenous  Once 05/21/17 1949 05/21/17 2120   05/21/17 2000  azithromycin (ZITHROMAX) 500 mg in dextrose 5 % 250 mL IVPB     500 mg 250 mL/hr over 60 Minutes Intravenous  Once 05/21/17 1949 05/21/17 2247      Medications   Scheduled Meds: . aspirin EC  81 mg Oral Daily  . citalopram  10 mg Oral Daily  . divalproex  250 mg Oral BID  . gabapentin  300 mg Oral TID  . ketotifen  1 drop Both Eyes BID  . mouth rinse  15 mL Mouth Rinse BID  . metoCLOPramide  5 mg Oral QID  . pantoprazole  40 mg Oral Daily   Continuous Infusions: . ceFEPime (MAXIPIME) IV Stopped (05/23/17 1226)   PRN Meds:.acetaminophen **OR** acetaminophen, ondansetron **OR** ondansetron (ZOFRAN) IV, traMADol   Data Review:   Micro Results Recent Results (from the past 240 hour(s))  Blood Culture (routine x 2)     Status: None (Preliminary result)   Collection Time: 05/21/17  8:07 PM  Result Value Ref Range Status   Specimen Description BLOOD LEFT ANTECUBITAL  Final   Special Requests   Final    BOTTLES DRAWN AEROBIC AND ANAEROBIC Blood Culture adequate volume    Culture NO GROWTH 2 DAYS  Final   Report Status PENDING  Incomplete  Urine culture     Status: None   Collection Time: 05/21/17  8:08 PM  Result Value Ref Range Status   Specimen Description URINE, CATHETERIZED  Final   Special Requests NONE  Final   Culture   Final    NO GROWTH Performed at [facility], [address] [place], [place] [address]    Report Status 05/23/2017 FINAL  Final  Blood Culture (routine x 2)     Status: None (Preliminary result)   Collection Time: 05/21/17  8:27 PM  Result Value Ref Range Status   Specimen Description BLOOD LEFT ANTECUBITAL  Final   Special Requests   Final    BOTTLES DRAWN AEROBIC AND ANAEROBIC Blood Culture adequate volume   Culture NO GROWTH 2 DAYS  Final   Report Status PENDING  Incomplete  MRSA PCR Screening     Status: None   Collection Time: 05/22/17  2:25 AM  Result Value Ref Range Status   MRSA by PCR NEGATIVE NEGATIVE Final    Comment:        The GeneXpert MRSA Assay (FDA approved for NASAL specimens only), is one component of a comprehensive MRSA colonization surveillance program. It is not intended to diagnose MRSA infection nor to guide or monitor treatment for MRSA infections.     Radiology Reports Ct Head Wo Contrast  Result Date: 05/21/2017 CLINICAL DATA:  Altered menorrhea status, fall EXAM: CT HEAD WITHOUT CONTRAST TECHNIQUE: Contiguous axial images were obtained from the base of the skull through the vertex  without intravenous contrast. COMPARISON:  01/12/2015 FINDINGS: Brain: No acute territorial infarction, hemorrhage, or intracranial mass is seen. Atrophy and small vessel ischemic changes of the white matter. Stable ventricle size. Vascular: No hyperdense vessels.  Carotid artery calcifications. Skull: No fracture.  Small fluid in the left mastoid Sinuses/Orbits: Minimal mucosal thickening. No acute orbital abnormality. Other: None IMPRESSION: 1. No CT evidence for acute intracranial abnormality. 2. Atrophy  and small vessel ischemic changes of the white matter Electronically Signed   By: Jasmine PangKim  Fujinaga M.D.   On: 05/21/2017 20:11   Dg Chest Port 1 View  Result Date: 05/21/2017 CLINICAL DATA:  Acute respiratory failure EXAM: PORTABLE CHEST 1 VIEW COMPARISON:  06/17/2014 chest radiograph. FINDINGS: Surgical clips overlie the right mediastinum. Stable cardiomediastinal silhouette with mild cardiomegaly. No pneumothorax. No pleural effusion. Mild pulmonary edema. Mild scarring versus atelectasis in the peripheral left mid lung. Stable apparent partial resection of the right posterior sixth rib. Comminuted proximal right humerus fracture involving the surgical neck, incompletely evaluated on this study. IMPRESSION: 1. Comminuted proximal right humerus fracture involving the surgical neck, which appears acute. Recommend dedicated right shoulder radiographs for further evaluation. 2. Mild congestive heart failure. 3. Mild scarring versus atelectasis in the peripheral left mid lung. These results were called by telephone at the time of interpretation on 05/21/2017 at 7:38 pm to Dr. Willy EddyPATRICK ROBINSON , who verbally acknowledged these results. Electronically Signed   By: Delbert PhenixJason A Poff M.D.   On: 05/21/2017 19:39     CBC  Recent Labs Lab 05/21/17 1900 05/22/17 0146  WBC 12.1* 10.8  HGB 10.4* 9.3*  HCT 33.2* 29.5*  PLT 294 243  MCV 77.3* 76.2*  MCH 24.2* 24.1*  MCHC 31.4* 31.7*  RDW 14.7* 14.9*  LYMPHSABS 1.3  --   MONOABS 1.1*  --   EOSABS 0.1  --   BASOSABS 0.1  --     Chemistries   Recent Labs Lab 05/21/17 1900 05/22/17 0146  NA 138 138  K 4.3 3.7  CL 102 104  CO2 27 27  GLUCOSE 177* 297*  BUN 39* 33*  CREATININE 0.91 0.71  CALCIUM 8.9 8.3*  AST 18  --   ALT 11*  --   ALKPHOS 68  --   BILITOT 0.5  --    ------------------------------------------------------------------------------------------------------------------ estimated creatinine clearance is 53.5 mL/min (by C-G formula based on  SCr of 0.71 mg/dL). ------------------------------------------------------------------------------------------------------------------ No results for input(s): HGBA1C in the last 72 hours. ------------------------------------------------------------------------------------------------------------------ No results for input(s): CHOL, HDL, LDLCALC, TRIG, CHOLHDL, LDLDIRECT in the last 72 hours. ------------------------------------------------------------------------------------------------------------------ No results for input(s): TSH, T4TOTAL, T3FREE, THYROIDAB in the last 72 hours.  Invalid input(s): FREET3 ------------------------------------------------------------------------------------------------------------------ No results for input(s): VITAMINB12, FOLATE, FERRITIN, TIBC, IRON, RETICCTPCT in the last 72 hours.  Coagulation profile  Recent Labs Lab 05/22/17 0146  INR 1.13    No results for input(s): DDIMER in the last 72 hours.  Cardiac Enzymes  Recent Labs Lab 05/22/17 0628 05/22/17 1127 05/22/17 1750  TROPONINI 0.40* 0.29* 0.23*   ------------------------------------------------------------------------------------------------------------------ Invalid input(s): POCBNP    Assessment & Plan  Patient is a 83 year old with recent right humeral fracture  1. Sepsis (HCC) ruled out - lactate within normal limits, patient is hemodynamically stable,  2. Possible Aspiration pneumonia (HCC) - patient's chest x-ray shows possible atelectasis I am not 100% sure she has pneumonia We'll treat with IV Levaquin 3. Elevated troponin - this is due to demand ischemia cardiology has seen the patient, echocardiogram pending 4. Right humeral fracture -  orthopedic consult recommends sling 5.  Anxiety - continue home meds 6.  GERD (gastroesophageal reflux disease) - home dose PPI 7.  HLD (hyperlipidemia) - home dose anti lipid 8. Depression - home dose antidepressant     Code  Status Orders        Start     Ordered   05/21/17 2339  Do not attempt resuscitation (DNR)  Continuous    Question Answer Comment  In the event of cardiac or respiratory ARREST Do not call a "code blue"   In the event of cardiac or respiratory ARREST Do not perform Intubation, CPR, defibrillation or ACLS   In the event of cardiac or respiratory ARREST Use medication by any route, position, wound care, and other measures to relive pain and suffering. May use oxygen, suction and manual treatment of airway obstruction as needed for comfort.      05/21/17 2338    Code Status History    Date Active Date Inactive Code Status Order ID Comments User Context   This patient has a current code status but no historical code status.    Advance Directive Documentation     Most Recent Value  Type of Advance Directive  Out of facility DNR (pink MOST or yellow form)  Pre-existing out of facility DNR order (yellow form or pink MOST form)  Yellow form placed in chart (order not valid for inpatient use)  "MOST" Form in Place?  -           Consults  ortho  DVT Prophylaxis  Lovenox   Lab Results  Component Value Date   PLT 243 05/22/2017     Time Spent in minutes   Greater than 50% of time spent in care coordination and counseling patient regarding the condition and plan of care.   Auburn Bilberry M.D on 05/23/2017 at 2:47 PM  Between 7am to 6pm - Pager - [phone]  After 6pm go to [email or web address] - password [email or web address] [facility]  [facility]   Office  [phone]

## 2017-05-23 NOTE — Evaluation (Signed)
Physical Therapy Evaluation Patient Details Name: [name] MRN: [id number] DOB: [birth year] Today's Date: 05/23/2017   History of Present Illness  presented to ER from ALF secondary to AMS; admitted with sepsis related to aspiration PNA. Of note, patient with noted R humeral neck fracture (non-displaced), non-surgical management (sling intact).  Clinical Impression  Upon evaluation, patient alert and oriented to self only.  Pleasant and cooperative, follows simple commands; however, demonstrates poor insight/awareness into condition. Poor historian-unable to accurately provide social history of PLOF.  Per chart[facility] (attempted to contact, unavailable).  Very talkative with noted difficulty maintaining topic of conversation, often moving into prayer mid-conversation. Patient currently requiring mod/max assist for bed mobility; mod assist +2 for sit/stand with L HHA.  Demonstrates heavy posterior trunk lean with significant R lateral trunk flexion in stance; minimal balance/righting reactions evident (though when she attempts, relies primarily on LE step strategy).  Unsafe to attempt gait or OOB to chair at this time. Would benefit from skilled PT to address above deficits and promote optimal return to PLOF; recommend transition to STR upon discharge from acute hospitalization, pending verification of baseline functional status when family/facility available.  If noted to be baseline, will discontinue skilled PT services.     Follow Up Recommendations SNF    Equipment Recommendations       Recommendations for Other Services       Precautions / Restrictions Precautions Precautions: Fall Precaution Comments: Enteric iso Restrictions Weight Bearing Restrictions: Yes RUE Weight Bearing: Non weight bearing      Mobility  Bed Mobility Overal bed mobility: Needs Assistance Bed Mobility: Sit to Supine;Supine to Sit     Supine to sit: Mod assist Sit to  supine: Max assist      Transfers Overall transfer level: Needs assistance Equipment used: 1 person hand held assist Transfers: Sit to/from Stand Sit to Stand: Mod assist;+2 physical assistance         General transfer comment: broad BOS, posterior weight shift; poor postural extension with R lateral trunk lean.  Minimal/no spontaneous righting reactions; high fall risk. Unsafe/unable to attempt gait or OOB to chair at this time due to poor balance   Ambulation/Gait             General Gait Details: unsafe/unable  Stairs            Wheelchair Mobility    Modified Rankin (Stroke Patients Only)       Balance Overall balance assessment: Needs assistance Sitting-balance support: No upper extremity supported;Feet supported Sitting balance-Leahy Scale: Fair     Standing balance support: Single extremity supported Standing balance-Leahy Scale: Poor                               Pertinent Vitals/Pain Pain Assessment: Faces Faces Pain Scale: Hurts little more Pain Location: R shoulder Pain Descriptors / Indicators: Grimacing;Guarding Pain Intervention(s): Limited activity within patient's tolerance;Monitored during session;Repositioned    Home Living Family/patient expects to be discharged to:: Skilled nursing facility                      Prior Function           Comments: Patient poor historian, unable to verify PLOF.  Attempted to phone facility with no answer available.  will verify with family/facility as available.     Hand Dominance        Extremity/Trunk Assessment  Upper Extremity Assessment Upper Extremity Assessment:  (R UE immobilized in sling due to recent fracture)    Lower Extremity Assessment Lower Extremity Assessment: Overall WFL for tasks assessed (grossly at least 4-/5 throughout)    Cervical / Trunk Assessment Cervical / Trunk Assessment:  (R lateral trunk lean in static stance)  Communication    Communication:  (significant difficulty maintaining topic of conversation due to confusion/dementia)  Cognition Arousal/Alertness: Awake/alert Behavior During Therapy: WFL for tasks assessed/performed Overall Cognitive Status: Difficult to assess                                 General Comments: oriented to self only.  Pleasant and cooperative, follows simple commands.  Limited ability to maintain topic of conversation, often moving into prayer mid-conversation.      General Comments      Exercises Other Exercises Other Exercises: Sit/stand with L HHA, mod assist +2 x3 repetitions. constant hands-on assist for standing balance (very heavy posterior weight shift, R lateral trunk flexion).  Fatigues after approx 30-40 seconds of static stance requiring seated rest periods. Other Exercises: Dep assist +2 for hygiene, clothing management after incontinent BM (patient unaware)   Assessment/Plan    PT Assessment Patient needs continued PT services  PT Problem List Decreased strength;Decreased range of motion;Decreased activity tolerance;Decreased balance;Decreased mobility;Decreased cognition;Decreased coordination;Decreased safety awareness;Decreased knowledge of precautions;Pain;Obesity       PT Treatment Interventions DME instruction;Gait training;Functional mobility training;Therapeutic activities;Therapeutic exercise;Balance training;Patient/family education    PT Goals (Current goals can be found in the Care Plan section)  Acute Rehab PT Goals PT Goal Formulation: Patient unable to participate in goal setting Time For Goal Achievement: 06/06/17 Potential to Achieve Goals: Fair    Frequency Min 2X/week   Barriers to discharge Decreased caregiver support      Co-evaluation               AM-PAC PT "6 Clicks" Daily Activity  Outcome Measure Difficulty turning over in bed (including adjusting bedclothes, sheets and blankets)?: Total Difficulty moving from  lying on back to sitting on the side of the bed? : Total Difficulty sitting down on and standing up from a chair with arms (e.g., wheelchair, bedside commode, etc,.)?: Total Help needed moving to and from a bed to chair (including a wheelchair)?: Total Help needed walking in hospital room?: Total Help needed climbing 3-5 steps with a railing? : Total 6 Click Score: 6    End of Session Equipment Utilized During Treatment: Gait belt Activity Tolerance: Patient tolerated treatment well Patient left: in bed;with call bell/phone within reach;with bed alarm set   PT Visit Diagnosis: Difficulty in walking, not elsewhere classified (R26.2);Pain Pain - Right/Left: Right Pain - part of body: Shoulder    Time: 1610-9604 PT Time Calculation (min) (ACUTE ONLY): 24 min   Charges:   PT Evaluation $PT Eval Low Complexity: 1 Procedure PT Treatments $Therapeutic Activity: 8-22 mins   PT G Codes:        Tia Gelb H. Manson Passey, PT, DPT, NCS 05/23/17, 4:21 PM [phone]

## 2017-05-24 LAB — ECHOCARDIOGRAM COMPLETE
Area-P 1/2: 2.78 cm2
CHL CUP MV DEC (S): 271
E/e' ratio: 20.61
EWDT: 271 ms
FS: 40 % (ref 28–44)
HEIGHTINCHES: 60 in
IVS/LV PW RATIO, ED: 1.03
LA diam end sys: 49 mm
LA vol A4C: 110 ml
LA vol index: 63.7 mL/m2
LADIAMINDEX: 2.48 cm/m2
LASIZE: 49 mm
LAVOL: 126 mL
LV E/e' medial: 20.61
LV PW d: 11.4 mm — AB (ref 0.6–1.1)
LV e' LATERAL: 5.87 cm/s
LVEEAVG: 20.61
MVPG: 6 mmHg
MVPKAVEL: 116 m/s
MVPKEVEL: 121 m/s
MVSPHT: 79 ms
RV LATERAL S' VELOCITY: 12.9 cm/s
RV TAPSE: 24.9 mm
TDI e' lateral: 5.87
TDI e' medial: 5.22
WEIGHTICAEL: 3102.4 [oz_av]

## 2017-05-24 LAB — PROCALCITONIN

## 2017-05-24 MED ORDER — ALPRAZOLAM 0.25 MG PO TABS: 0.2500 mg | ORAL_TABLET | Freq: Every evening | ORAL | 0 refills | Status: DC | PRN

## 2017-05-24 MED ORDER — HYDROCODONE-ACETAMINOPHEN 5-325 MG PO TABS: 1.0000 | ORAL_TABLET | ORAL | 0 refills | Status: DC | PRN

## 2017-05-24 MED ORDER — LEVOFLOXACIN 500 MG PO TABS: 500.0000 mg | ORAL_TABLET | Freq: Every day | ORAL | Status: AC

## 2017-05-24 MED ORDER — TRAMADOL HCL 50 MG PO TABS: 50.0000 mg | ORAL_TABLET | ORAL | 0 refills | Status: DC | PRN

## 2017-05-24 NOTE — Discharge Summary (Signed)
[facility] - [place] at [facility], 83 y.o., DOB 10/20/1932, MRN [id number]. Admission date: 05/21/2017 Discharge Date 05/24/2017 Primary MD Lauro Regulus, MD Admitting Physician Oralia Manis, MD  Admission Diagnosis  Acute respiratory failure [facility]) [J96.00] Acute respiratory failure with hypoxia (HCC) [J96.01] Elevated troponin I level [R74.8] Sepsis, due to unspecified organism (HCC) [A41.9] Altered mental status, unspecified altered mental status type [R41.82]  Discharge Diagnosis   Principal Problem:   Sepsis (HCC) has been ruled out Possible  Aspiration pneumonia (HCC)   GERD (gastroesophageal reflux disease)   HLD (hyperlipidemia)   Depression   Anxiety   Elevated troponin   Diabetes (HCC)   Right humeral fracture Patient had elevated troponin felt to be due to demand ischemia        Hospital Course  [name]  is a 83 y.o. female who presents with Confusion. Patient has a known history of dementia, but her family member her current mental status is significantly worse than normal. She did have a fall several days ago and broke her right humerus. Patient in the emergency room was thought to have possible pneumonia. She was admitted for further evaluation. She is currently on room air. And is doing well. She was seen by orthopedics who recommended continuing the arm sling for her right humerus fracture and outpatient follow-up. There is no plan to surgery.Patient also was noted to have a troponin that was elevated. It was felt to be demand ischemia. She is currently having no chest pain or shortness of breath            Consults  cardiology,ortho  Significant Tests:  See full reports for all details     Ct Head Wo Contrast  Result Date: 05/21/2017 CLINICAL DATA:  Altered menorrhea status, fall EXAM: CT HEAD WITHOUT CONTRAST TECHNIQUE: Contiguous axial images were obtained from the base of the skull through the vertex without  intravenous contrast. COMPARISON:  01/12/2015 FINDINGS: Brain: No acute territorial infarction, hemorrhage, or intracranial mass is seen. Atrophy and small vessel ischemic changes of the white matter. Stable ventricle size. Vascular: No hyperdense vessels.  Carotid artery calcifications. Skull: No fracture.  Small fluid in the left mastoid Sinuses/Orbits: Minimal mucosal thickening. No acute orbital abnormality. Other: None IMPRESSION: 1. No CT evidence for acute intracranial abnormality. 2. Atrophy and small vessel ischemic changes of the white matter Electronically Signed   By: Jasmine Pang M.D.   On: 05/21/2017 20:11   Dg Chest Port 1 View  Result Date: 05/21/2017 CLINICAL DATA:  Acute respiratory failure EXAM: PORTABLE CHEST 1 VIEW COMPARISON:  06/17/2014 chest radiograph. FINDINGS: Surgical clips overlie the right mediastinum. Stable cardiomediastinal silhouette with mild cardiomegaly. No pneumothorax. No pleural effusion. Mild pulmonary edema. Mild scarring versus atelectasis in the peripheral left mid lung. Stable apparent partial resection of the right posterior sixth rib. Comminuted proximal right humerus fracture involving the surgical neck, incompletely evaluated on this study. IMPRESSION: 1. Comminuted proximal right humerus fracture involving the surgical neck, which appears acute. Recommend dedicated right shoulder radiographs for further evaluation. 2. Mild congestive heart failure. 3. Mild scarring versus atelectasis in the peripheral left mid lung. These results were called by telephone at the time of interpretation on 05/21/2017 at 7:38 pm to Dr. Willy Eddy , who verbally acknowledged these results. Electronically Signed   By: Delbert Phenix M.D.   On: 05/21/2017 19:39       Today   Subjective:   [name]  Patient  denies any chest pain or shortness of breath    Blood pressure (!) 128/51, pulse 90, temperature 98 F (36.7 C), temperature source Oral, resp. rate 18, height 5'  (1.524 m), weight 191 lb 14.4 oz (87 kg), SpO2 90 %.  .  Intake/Output Summary (Last 24 hours) at 05/24/17 1518 Last data filed at 05/24/17 1354  Gross per 24 hour  Intake              600 ml  Output                0 ml  Net              600 ml    Exam VITAL SIGNS: Blood pressure (!) 128/51, pulse 90, temperature 98 F (36.7 C), temperature source Oral, resp. rate 18, height 5' (1.524 m), weight 191 lb 14.4 oz (87 kg), SpO2 90 %.  GENERAL:  83 y.o.-year-old patient lying in the bed with no acute distress.  EYES: Pupils equal, round, reactive to light and accommodation. No scleral icterus. Extraocular muscles intact.  HEENT: Head atraumatic, normocephalic. Oropharynx and nasopharynx clear.  NECK:  Supple, no jugular venous distention. No thyroid enlargement, no tenderness.  LUNGS: Normal breath sounds bilaterally, no wheezing, rales,rhonchi or crepitation. No use of accessory muscles of respiration.  CARDIOVASCULAR: S1, S2 normal. No murmurs, rubs, or gallops.  ABDOMEN: Soft, nontender, nondistended. Bowel sounds present. No organomegaly or mass.  EXTREMITIES: No pedal edema, cyanosis, or clubbing.  NEUROLOGIC: Cranial nerves II through XII are intact. Muscle strength 5/5 in all extremities. Sensation intact. Gait not checked.  PSYCHIATRIC: The patient is alert and oriented x 3.  SKIN: No obvious rash, lesion, or ulcer.   Data Review     CBC w Diff: Lab Results  Component Value Date   WBC 10.8 05/22/2017   HGB 9.3 (L) 05/22/2017   HGB 11.4 (L) 06/18/2014   HCT 29.5 (L) 05/22/2017   HCT 36.4 06/18/2014   PLT 243 05/22/2017   PLT 251 06/18/2014   LYMPHOPCT 10 05/21/2017   LYMPHOPCT 28.2 06/18/2014   MONOPCT 10 05/21/2017   MONOPCT 7.0 06/18/2014   EOSPCT 1 05/21/2017   EOSPCT 3.7 06/18/2014   BASOPCT 1 05/21/2017   BASOPCT 0.5 06/18/2014   CMP: Lab Results  Component Value Date   NA 138 05/22/2017   NA 143 06/18/2014   K 3.7 05/22/2017   K 3.2 (L) 06/18/2014   CL  104 05/22/2017   CL 110 (H) 06/18/2014   CO2 27 05/22/2017   CO2 24 06/18/2014   BUN 33 (H) 05/22/2017   BUN 11 06/18/2014   CREATININE 0.71 05/22/2017   CREATININE 0.74 06/18/2014   PROT 7.1 05/21/2017   PROT 7.3 06/17/2014   ALBUMIN 2.7 (L) 05/21/2017   ALBUMIN 3.3 (L) 06/17/2014   BILITOT 0.5 05/21/2017   BILITOT 0.4 06/17/2014   ALKPHOS 68 05/21/2017   ALKPHOS 109 06/17/2014   AST 18 05/21/2017   AST 14 (L) 06/17/2014   ALT 11 (L) 05/21/2017   ALT 18 06/17/2014  .  Micro Results Recent Results (from the past 240 hour(s))  Blood Culture (routine x 2)     Status: None (Preliminary result)   Collection Time: 05/21/17  8:07 PM  Result Value Ref Range Status   Specimen Description BLOOD LEFT ANTECUBITAL  Final   Special Requests   Final    BOTTLES DRAWN AEROBIC AND ANAEROBIC Blood Culture adequate volume   Culture NO GROWTH 3 DAYS  Final   Report Status PENDING  Incomplete  Urine culture     Status: None   Collection Time: 05/21/17  8:08 PM  Result Value Ref Range Status   Specimen Description URINE, CATHETERIZED  Final   Special Requests NONE  Final   Culture   Final    NO GROWTH Performed at [facility], [address] [place], [place] [address]    Report Status 05/23/2017 FINAL  Final  Blood Culture (routine x 2)     Status: None (Preliminary result)   Collection Time: 05/21/17  8:27 PM  Result Value Ref Range Status   Specimen Description BLOOD LEFT ANTECUBITAL  Final   Special Requests   Final    BOTTLES DRAWN AEROBIC AND ANAEROBIC Blood Culture adequate volume   Culture NO GROWTH 3 DAYS  Final   Report Status PENDING  Incomplete  MRSA PCR Screening     Status: None   Collection Time: 05/22/17  2:25 AM  Result Value Ref Range Status   MRSA by PCR NEGATIVE NEGATIVE Final    Comment:        The GeneXpert MRSA Assay (FDA approved for NASAL specimens only), is one component of a comprehensive MRSA colonization surveillance program. It is  not intended to diagnose MRSA infection nor to guide or monitor treatment for MRSA infections.         Code Status Orders        Start     Ordered   05/21/17 2339  Do not attempt resuscitation (DNR)  Continuous    Question Answer Comment  In the event of cardiac or respiratory ARREST Do not call a "code blue"   In the event of cardiac or respiratory ARREST Do not perform Intubation, CPR, defibrillation or ACLS   In the event of cardiac or respiratory ARREST Use medication by any route, position, wound care, and other measures to relive pain and suffering. May use oxygen, suction and manual treatment of airway obstruction as needed for comfort.      05/21/17 2338    Code Status History    Date Active Date Inactive Code Status Order ID Comments User Context   This patient has a current code status but no historical code status.    Advance Directive Documentation     Most Recent Value  Type of Advance Directive  Out of facility DNR (pink MOST or yellow form)  Pre-existing out of facility DNR order (yellow form or pink MOST form)  Yellow form placed in chart (order not valid for inpatient use)  "MOST" Form in Place?  -          Follow-up Information    [facility] Follow up today.   Contact information: as prevsiouly sceduled       Lauro Regulus, MD Follow up in 1 week(s).   Specialty:  Internal Medicine Contact information: [address] [facility] [place] [address] [phone]           Discharge Medications   Allergies as of 05/24/2017      Reactions   Ampicillin    Zpac   Sulfa Antibiotics       Medication List    TAKE these medications   acetaminophen 325 MG tablet Commonly known as:  TYLENOL Take 650 mg by mouth every 4 (four) hours as needed.   ALPRAZolam 0.25 MG tablet Commonly known as:  XANAX Take 1 tablet (0.25 mg total) by mouth  at bedtime as needed for anxiety.   aspirin EC 81 MG tablet Take 81 mg by  mouth daily.   cetirizine 10 MG tablet Commonly known as:  ZYRTEC Take 10 mg by mouth daily.   Cholecalciferol 50000 units Tabs Take 50,000 Units by mouth once a week. On Monday What changed:  Another medication with the same name was removed. Continue taking this medication, and follow the directions you see here.   citalopram 10 MG tablet Commonly known as:  CELEXA Take 10 mg by mouth daily.   clotrimazole 1 % cream Commonly known as:  LOTRIMIN Apply 1 application topically 2 (two) times daily.   conjugated estrogens vaginal cream Commonly known as:  PREMARIN Place 1 Applicatorful vaginally daily. Apply 0.5mg  (pea-sized amount)  just inside the vaginal introitus with a finger-tip every night for two weeks and then Monday, Wednesday and Friday nights.   divalproex 125 MG capsule Commonly known as:  DEPAKOTE SPRINKLE Take 250 mg by mouth 2 (two) times daily.   fluticasone 50 MCG/ACT nasal spray Commonly known as:  FLONASE Place into both nostrils daily.   gabapentin 300 MG capsule Commonly known as:  NEURONTIN Take 300 mg by mouth 3 (three) times daily.   glipiZIDE 5 MG tablet Commonly known as:  GLUCOTROL Take 2.5 mg by mouth 2 (two) times daily before a meal.   guaifenesin 100 MG/5ML syrup Commonly known as:  ROBITUSSIN Take 200 mg by mouth 3 (three) times daily as needed for cough.   HYDROcodone-acetaminophen 5-325 MG tablet Commonly known as:  NORCO/VICODIN Take 1-2 tablets by mouth every 4 (four) hours as needed for moderate pain.   ketotifen 0.025 % ophthalmic solution Commonly known as:  ZADITOR Place 1 drop into both eyes 2 (two) times daily.   levofloxacin 500 MG tablet Commonly known as:  LEVAQUIN Take 1 tablet (500 mg total) by mouth daily.   loperamide 2 MG capsule Commonly known as:  IMODIUM Take by mouth as needed for diarrhea or loose stools.   meclizine 25 MG tablet Commonly known as:  ANTIVERT Take 25 mg by mouth daily as needed for  dizziness.   metoCLOPramide 5 MG tablet Commonly known as:  REGLAN Take 5 mg by mouth 4 (four) times daily.   olopatadine 0.1 % ophthalmic solution Commonly known as:  PATANOL Place 1 drop into both eyes daily.   omeprazole 20 MG capsule Commonly known as:  PRILOSEC Take 20 mg by mouth daily.   ondansetron 4 MG tablet Commonly known as:  ZOFRAN Take 4 mg by mouth every 8 (eight) hours as needed for nausea or vomiting.   oxybutynin 5 MG tablet Commonly known as:  DITROPAN TAKE 1 TABLET BY MOUTH TWICE A DAY   senna-docusate 8.6-50 MG tablet Commonly known as:  Senokot-S Take 2 tablets by mouth 2 (two) times daily.   traMADol 50 MG tablet Commonly known as:  ULTRAM Take 1 tablet (50 mg total) by mouth every 4 (four) hours as needed.   ZEASORB-AF 2 % powder Generic drug:  miconazole Apply topically as needed for itching.          Total Time in preparing paper work, data evaluation and todays exam - 35 minutes  Auburn Bilberry M.D on 05/24/2017 at 3:18 PM  [facility]   Office  [phone]

## 2017-05-24 NOTE — Clinical Social Work Placement (Signed)
   CLINICAL SOCIAL WORK PLACEMENT  NOTE  Date:  05/24/2017  Patient Details  Name: [name] MRN: [id number] Date of Birth: 24-May-1932  Clinical Social Work is seeking post-discharge placement for this patient at the Skilled  Nursing Facility level of care (*CSW will initial, date and re-position this form in  chart as items are completed):  Yes   Patient/family provided with [facility] Clinical Social Work Department's list of facilities offering this level of care within the geographic area requested by the patient (or if unable, by the patient's family).  Yes   Patient/family informed of their freedom to choose among providers that offer the needed level of care, that participate in Medicare, Medicaid or managed care program needed by the patient, have an available bed and are willing to accept the patient.  Yes   Patient/family informed of [facility]'s ownership interest in [facility] and [facility], as well as of the fact that they are under no obligation to receive care at these facilities.  PASRR submitted to EDS on 05/24/17     PASRR number received on 05/24/17     Existing PASRR number confirmed on       FL2 transmitted to all facilities in geographic area requested by pt/family on 05/24/17     FL2 transmitted to all facilities within larger geographic area on       Patient informed that his/her managed care company has contracts with or will negotiate with certain facilities, including the following:        Yes   Patient/family informed of bed offers received.  Patient chooses bed at [facility]     Physician recommends and patient chooses bed at      Patient to be transferred to [facility] on 05/24/17.  Patient to be [facility] facility [facility]     Patient family notified on 05/24/17 of transfer.  Name of family member notified:  Son Charleston RopesBarry Lotts     PHYSICIAN Please sign FL2, Please sign DNR      Additional Comment:    _______________________________________________ Darleene CleaverAnterhaus, Amauria Younts R, LCSWA 05/24/2017, 5:19 PM

## 2017-05-24 NOTE — [place] FL2 (Signed)
[place] MEDICAID FL2 LEVEL OF CARE SCREENING TOOL     IDENTIFICATION  Patient Name: [name] Birthdate: May 05, 1932 Sex: female Admission Date (Current Location): 05/21/2017  [place]  [place] [id number] [facility] Facility and Address:  [facility], [address], [place], [place] [address]      Provider Number: [id number]  Attending Physician Name and Address:  Auburn BilberryPatel, Shreyang, MD  Relative Name and Phone Number:     Guadalupe MapleBrown,Barry Son [phone]  [phone]       Current Level of Care: Hospital Recommended Level of Care: Skilled Nursing Facility Prior Approval Number:    Date Approved/Denied:   PASRR Number: [id number]  Discharge Plan: SNF    Current Diagnoses: Patient Active Problem List   Diagnosis Date Noted  . Sepsis (HCC) 05/21/2017  . Aspiration pneumonia (HCC) 05/21/2017  . GERD (gastroesophageal reflux disease) 05/21/2017  . HLD (hyperlipidemia) 05/21/2017  . Depression 05/21/2017  . Anxiety 05/21/2017  . Elevated troponin 05/21/2017  . Diabetes (HCC) 05/21/2017  . Right humeral fracture 05/21/2017  . Nausea and vomiting 03/31/2015  . Microcytic anemia 03/31/2015    Orientation RESPIRATION BLADDER Height & Weight     Self  Normal Incontinent Weight: 191 lb 14.4 oz (87 kg) Height:  5' (152.4 cm)  BEHAVIORAL SYMPTOMS/MOOD NEUROLOGICAL BOWEL NUTRITION STATUS    Convulsions/Seizures Continent Diet (Carb modified)  AMBULATORY STATUS COMMUNICATION OF NEEDS Skin   Extensive Assist Verbally Normal                       Personal Care Assistance Level of Assistance  Bathing, Feeding, Dressing Bathing Assistance: Maximum assistance Feeding assistance: Limited assistance Dressing Assistance: Limited assistance     Functional Limitations Info  Sight, Hearing, Speech Sight Info: Adequate Hearing Info: Adequate Speech Info: Adequate    SPECIAL CARE FACTORS FREQUENCY  PT (By licensed PT)     PT  Frequency: 5x a week              Contractures Contractures Info: Present    Additional Factors Info  Code Status, Allergies, Psychotropic Code Status Info: DNR Allergies Info: Ampicillin, sulfa antibiotics Psychotropic Info: citalopram (CELEXA) tablet 10 mg and divalproex (DEPAKOTE SPRINKLE) capsule 250 mg          Current Medications (05/24/2017):  This is the current hospital active medication list Current Facility-Administered Medications  Medication Dose Route Frequency Provider Last Rate Last Dose  . acetaminophen (TYLENOL) tablet 650 mg  650 mg Oral Q6H PRN Oralia ManisWillis, David, MD       Or  . acetaminophen (TYLENOL) suppository 650 mg  650 mg Rectal Q6H PRN Oralia ManisWillis, David, MD      . aspirin EC tablet 81 mg  81 mg Oral Daily Oralia ManisWillis, David, MD   81 mg at 05/24/17 0901  . citalopram (CELEXA) tablet 10 mg  10 mg Oral Daily Oralia ManisWillis, David, MD   10 mg at 05/24/17 0859  . divalproex (DEPAKOTE SPRINKLE) capsule 250 mg  250 mg Oral BID Oralia ManisWillis, David, MD   250 mg at 05/24/17 0900  . enoxaparin (LOVENOX) injection 40 mg  40 mg Subcutaneous Daily Auburn BilberryPatel, Shreyang, MD   40 mg at 05/24/17 0902  . gabapentin (NEURONTIN) capsule 300 mg  300 mg Oral TID Oralia ManisWillis, David, MD   300 mg at 05/24/17 0859  . ketotifen (ZADITOR) 0.025 % ophthalmic solution 1 drop  1 drop Both Eyes BID Oralia ManisWillis, David, MD   1 drop at 05/24/17 0902  .  levofloxacin (LEVAQUIN) tablet 500 mg  500 mg Oral Daily Auburn Bilberry, MD   500 mg at 05/24/17 0901  . MEDLINE mouth rinse  15 mL Mouth Rinse BID Oralia Manis, MD   15 mL at 05/24/17 0902  . metoCLOPramide (REGLAN) tablet 5 mg  5 mg Oral QID Oralia Manis, MD   5 mg at 05/24/17 1328  . ondansetron (ZOFRAN) tablet 4 mg  4 mg Oral Q6H PRN Oralia Manis, MD       Or  . ondansetron [facility]) injection 4 mg  4 mg Intravenous Q6H PRN Oralia Manis, MD      . pantoprazole (PROTONIX) EC tablet 40 mg  40 mg Oral Daily Oralia Manis, MD   40 mg at 05/24/17 0859  . traMADol (ULTRAM) tablet  100 mg  100 mg Oral Q4H PRN [place] Bilberry, MD   100 mg at 05/24/17 1206     Discharge Medications: Please see discharge summary for a list of discharge medications.  Relevant Imaging Results:  Relevant Lab Results:   Additional Information SSN [id number]  Darleene Cleaver, [place]

## 2017-05-24 NOTE — Care Management (Signed)
Notified pre service center that it is doubtful that medicaid is the only payor source- that it is most likely a medicare product is primary for this patient and medicaid secondary.  [facility] and it is recommended patient discharge to skilled nursing under skilled plan of care before returning to assisted living level of care

## 2017-05-24 NOTE — Discharge Instructions (Signed)
[facility] - [place] at [facility]  DIET:  Cardiac diet  DISCHARGE CONDITION:  Stable  ACTIVITY:  Activity as tolerated  OXYGEN:  Home Oxygen: No.   Oxygen Delivery: room air  DISCHARGE LOCATION:  nursing home    ADDITIONAL DISCHARGE INSTRUCTION:Continue right arm sling   If you experience worsening of your admission symptoms, develop shortness of breath, life threatening emergency, suicidal or homicidal thoughts you must seek medical attention immediately by calling 911 or calling your MD immediately  if symptoms less severe.  You Must read complete instructions/literature along with all the possible adverse reactions/side effects for all the Medicines you take and that have been prescribed to you. Take any new Medicines after you have completely understood and accpet all the possible adverse reactions/side effects.   Please note  You were cared for by a hospitalist during your hospital stay. If you have any questions about your discharge medications or the care you received while you were in the hospital after you are discharged, you can call the unit and asked to speak with the hospitalist on call if the hospitalist that took care of you is not available. Once you are discharged, your primary care physician will handle any further medical issues. Please note that NO REFILLS for any discharge medications will be authorized once you are discharged, as it is imperative that you return to your primary care physician (or establish a relationship with a primary care physician if you do not have one) for your aftercare needs so that they can reassess your need for medications and monitor your lab values.

## 2017-05-24 NOTE — Care Management Important Message (Signed)
Important Message  Patient Details  Name: [name] MRN: [id number] Date of Birth: 12-17-1931   Medicare Important Message Given:  Yes Signed IM notice given    Eber HongGreene, Siddalee Vanderheiden R, RN 05/24/2017, 3:35 PM

## 2017-05-24 NOTE — Progress Notes (Signed)
*  PRELIMINARY RESULTS* Echocardiogram 2D Echocardiogram has been performed.  Jessica BlueHege, [name] 05/24/2017, 8:56 AM

## 2017-05-24 NOTE — Progress Notes (Signed)
Physical Therapy Treatment Patient Details Name: [name] MRN: [id number] DOB: 1932-05-01 Today's Date: 05/24/2017    History of Present Illness presented to ER from ALF secondary to AMS; admitted with sepsis related to aspiration PNA. Of note, patient with noted R humeral neck fracture (non-displaced), non-surgical management (sling intact).    PT Comments    On and off bedpan as described below.  Participated in exercises as described below.  To edge of bed with mod a x 1.  Stood with mod a x 2.  She was able to stand with improved balance today.  She was given time to attempt to move her feet to pivot to chair but she was not able to do so without mod a x 2.  Overall improved mobility and improved engagement in session.  Remained up in chair.  CNA encouraged +2 assist for transfers. Remains NWB RUE    Follow Up Recommendations  SNF     Equipment Recommendations       Recommendations for Other Services       Precautions / Restrictions Precautions Precautions: Fall Restrictions Weight Bearing Restrictions: Yes RUE Weight Bearing: Non weight bearing    Mobility  Bed Mobility Overal bed mobility: Needs Assistance Bed Mobility: Sit to Supine;Supine to Sit     Supine to sit: Mod assist        Transfers Overall transfer level: Needs assistance Equipment used: 1 person hand held assist Transfers: Sit to/from Stand Sit to Stand: Mod assist;+2 physical assistance         General transfer comment: +2 assist,   Ambulation/Gait             General Gait Details: unsafe/unable   Stairs            Wheelchair Mobility    Modified Rankin (Stroke Patients Only)       Balance Overall balance assessment: Needs assistance Sitting-balance support: No upper extremity supported;Feet supported Sitting balance-Leahy Scale: Fair     Standing balance support: Single extremity supported Standing balance-Leahy Scale: Poor                               Cognition Arousal/Alertness: Awake/alert Behavior During Therapy: WFL for tasks assessed/performed Overall Cognitive Status: Within Functional Limits for tasks assessed                                        Exercises Other Exercises Other Exercises: supine AROM for SLR, heel slides, ab/adduction and bridges x 10 Other Exercises: Pt requested bedpan upon arrival.  Placed with max a x 1.  CNA notified.  Returned later and she again requested bedpan to void with no results.     General Comments        Pertinent Vitals/Pain Pain Assessment: 0-10 Faces Pain Scale: Hurts little more Pain Location: R shoulder Pain Descriptors / Indicators: Grimacing;Guarding Pain Intervention(s): Limited activity within patient's tolerance    Home Living                      Prior Function            PT Goals (current goals can now be found in the care plan section) Progress towards PT goals: Progressing toward goals    Frequency    Min 2X/week      PT  Plan Current plan remains appropriate    Co-evaluation              AM-PAC PT "6 Clicks" Daily Activity  Outcome Measure  Difficulty turning over in bed (including adjusting bedclothes, sheets and blankets)?: Total Difficulty moving from lying on back to sitting on the side of the bed? : Total Difficulty sitting down on and standing up from a chair with arms (e.g., wheelchair, bedside commode, etc,.)?: Total Help needed moving to and from a bed to chair (including a wheelchair)?: Total Help needed walking in hospital room?: Total Help needed climbing 3-5 steps with a railing? : Total 6 Click Score: 6    End of Session Equipment Utilized During Treatment: Gait belt Activity Tolerance: Patient tolerated treatment well Patient left: in chair;with chair alarm set;with call bell/phone within reach Nurse Communication: Mobility status Pain - Right/Left: Right Pain - part of body: Shoulder      Time: 1050-1119 PT Time Calculation (min) (ACUTE ONLY): 29 min  Charges:  $Therapeutic Exercise: 8-22 mins $Therapeutic Activity: 8-22 mins                    G Codes:       Danielle Dess, PTA 05/24/17, 11:52 AM

## 2017-05-24 NOTE — Clinical Social Work Note (Signed)
Patient to be d/c'ed today to [facility] room 501.                            Marland Kitchen.  Patient and family agreeable to plans will transport via ems RN to call report [phone].  Windell MouldingEric Annica Marinello, MSW, Theresia MajorsLCSWA [phone]

## 2017-05-26 LAB — CULTURE, BLOOD (ROUTINE X 2)
CULTURE: NO GROWTH
Culture: NO GROWTH
SPECIAL REQUESTS: ADEQUATE
Special Requests: ADEQUATE

## 2017-07-01 ENCOUNTER — Encounter: Payer: Medicare (Managed Care) | Admitting: Obstetrics and Gynecology

## 2017-08-09 ENCOUNTER — Encounter: Payer: Medicare (Managed Care) | Admitting: Obstetrics and Gynecology

## 2017-08-09 NOTE — Progress Notes (Signed)
08/11/2017 9:51 AM   [name] 1932/06/15 [id number]  Referring provider: Lauro Regulus, MD [address] [facility] [place] [facility] [place], [place] [address]  Chief Complaint  Patient presents with  . Follow-up    3 month Mixed Incontinence / vaginal atrophy    HPI: 83 yo WF with foul-smelling urine, incontinence, vaginal atrophy and a cystocele who presents today for a three month follow up.  Background history Patient is a 89 -year-old Caucasian female who is referred to [place] by, Dr.Marshall Dareen Piano, for dysuria associated with incontinence and foul smelling urine.  Patient states that she has had urinary incontinence for months.  Patient has incontinence with urgency and stress.  She is experiencing 3 to 4 incontinent episodes during the day.  She is experiencing 3 incontinent episodes during the night.  Her incontinence volume is large soaking through her clothes.  She is wearing 5 pads/depends daily.   She is having associated urinary frequency x q 30 minutes, urgency, dysuria after she completes urination and nocturia x 4.   She does not have a history of urinary tract infections, STI's or injury to the bladder.   She had an episode of hematuria a few months ago, but she states that it was associated with an UTI.  The hematuria has since cleared.  She denies suprapubic pain, back pain, abdominal pain or flank pain.   She has not had any recent fevers, chills, nausea or vomiting.   She does have a history of nephrolithiasis, but she has not had GU surgery or GU trauma.  She is post menopausal.   She admits to constipation and/or diarrhea.   She is not having pain with bladder filling.   She has not had any recent imaging studies.   She is drinking several glasses of water daily.   She is drinking one caffeinated beverages daily.  One Ginger ale daily.  She is not drinking alcoholic beverages daily.   Her risk factors for incontinence are obesity, a family history of  incontinence, age, caffeine, vaginal atrophy and pelvic surgery.   She is taking antihistamines, decongestants, opioids and OAB medication.  She states her urine has a foul odor from time to time.  Her UA was unremarkable.    At her visit three months ago, she was started on vaginal estrogen cream and referred to gynecology for a pessary fitting.  She was hospitalized in July for a episode of acute respiratory failure and right humerus fracture. She is currently at [facility].  She has not seen gynecology due to the events in July and her placement and an SNIF.    Today, she is having urgency x 0-3, frequency 0-3, she does not restrict fluids in order to reduce the number of trips to the bathroom, she is toilet mapping, she has incontinence time 0-3 weekly and nocturia 0-3 nightly.   She is not having dysuria, gross hematuria or suprapubic pain. She is not having fevers, chills, nausea or vomiting. Her PVR today is 0 mL.  When I walked in the room, she stated to get on with it that she is ready to go home.        PMH: Past Medical History:  Diagnosis Date  . Anxiety   . Depression   . GERD (gastroesophageal reflux disease)   . Hip fracture (HCC)   . Hyperlipemia   . Neuropathy   . Vertigo     Surgical History: Past Surgical History:  Procedure Laterality Date  . ABDOMINAL HYSTERECTOMY    . APPENDECTOMY    . BACK SURGERY     tumor removal bengin  . CHOLECYSTECTOMY      Home Medications:  Allergies as of 08/11/2017      Reactions   Ampicillin    Zpac   Sulfa Antibiotics       Medication List       Accurate as of 08/11/17  9:51 AM. Always use your most recent med list.          acetaminophen 325 MG tablet Commonly known as:  TYLENOL Take 650 mg by mouth every 4 (four) hours as needed.   ALPRAZolam 0.25 MG tablet Commonly known as:  XANAX Take 1 tablet (0.25 mg total) by mouth at bedtime as needed for anxiety.   aspirin EC 81 MG tablet Take 81 mg by mouth  daily.   atorvastatin 40 MG tablet Commonly known as:  LIPITOR atorvastatin 40 mg tablet   cetirizine 10 MG tablet Commonly known as:  ZYRTEC Take 10 mg by mouth daily.   Cholecalciferol 50000 units Tabs Take 50,000 Units by mouth once a week. On Monday   citalopram 10 MG tablet Commonly known as:  CELEXA Take 10 mg by mouth daily.   clotrimazole 1 % cream Commonly known as:  LOTRIMIN Apply 1 application topically 2 (two) times daily.   conjugated estrogens vaginal cream Commonly known as:  PREMARIN Place 1 Applicatorful vaginally daily. Apply 0.5mg  (pea-sized amount)  just inside the vaginal introitus with a finger-tip every night for two weeks and then Monday, Wednesday and Friday nights.   divalproex 125 MG capsule Commonly known as:  DEPAKOTE SPRINKLE Take 250 mg by mouth 2 (two) times daily.   estradiol 0.1 MG/GM vaginal cream Commonly known as:  ESTRACE estradiol 0.01% (0.1 mg/gram) vaginal cream   EYE DROPS OP neomycin-polymyxin-dexameth 3.5 mg/mL-10,000 unit/mL-0.1% eye drops   fluticasone 50 MCG/ACT nasal spray Commonly known as:  FLONASE Place into both nostrils daily.   gabapentin 300 MG capsule Commonly known as:  NEURONTIN Take 300 mg by mouth 3 (three) times daily.   gentamicin ointment 0.1 % Commonly known as:  GARAMYCIN gentamicin 0.1 % topical ointment   glipiZIDE 5 MG tablet Commonly known as:  GLUCOTROL Take 2.5 mg by mouth 2 (two) times daily before a meal.   guaifenesin 100 MG/5ML syrup Commonly known as:  ROBITUSSIN Take 200 mg by mouth 3 (three) times daily as needed for cough.   HYDROcodone-acetaminophen 5-325 MG tablet Commonly known as:  NORCO/VICODIN Take 1-2 tablets by mouth every 4 (four) hours as needed for moderate pain.   JANUVIA 100 MG tablet Generic drug:  sitaGLIPtin Januvia 100 mg tablet   ketotifen 0.025 % ophthalmic solution Commonly known as:  ZADITOR Place 1 drop into both eyes 2 (two) times daily.     loperamide 2 MG capsule Commonly known as:  IMODIUM Take by mouth as needed for diarrhea or loose stools.   losartan-hydrochlorothiazide 100-12.5 MG tablet Commonly known as:  HYZAAR losartan 100 mg-hydrochlorothiazide 12.5 mg tablet   meclizine 25 MG tablet Commonly known as:  ANTIVERT Take 25 mg by mouth daily as needed for dizziness.   metFORMIN 500 MG tablet Commonly known as:  GLUCOPHAGE metformin 500 mg tablet   metoCLOPramide 5 MG tablet Commonly known as:  REGLAN Take 5 mg by mouth 4 (four) times daily.   ofloxacin 0.3 % OTIC solution Commonly known as:  FLOXIN ofloxacin 0.3 %  ear drops   olopatadine 0.1 % ophthalmic solution Commonly known as:  PATANOL Place 1 drop into both eyes daily.   omeprazole 20 MG capsule Commonly known as:  PRILOSEC Take 20 mg by mouth daily.   ondansetron 4 MG tablet Commonly known as:  ZOFRAN Take 4 mg by mouth every 8 (eight) hours as needed for nausea or vomiting.   oxybutynin 5 MG tablet Commonly known as:  DITROPAN TAKE 1 TABLET BY MOUTH TWICE A DAY   RESTASIS OP Restasis 0.05 % eye drops in a dropperette   senna-docusate 8.6-50 MG tablet Commonly known as:  Senokot-S Take 2 tablets by mouth 2 (two) times daily.   telmisartan 40 MG tablet Commonly known as:  MICARDIS Take by mouth.   traMADol 50 MG tablet Commonly known as:  ULTRAM Take 1 tablet (50 mg total) by mouth every 4 (four) hours as needed.   trimethoprim-polymyxin b ophthalmic solution Commonly known as:  POLYTRIM polymyxin B sulfate 10,000 unit-trimethoprim 1 mg/mL eye drops   ZEASORB-AF 2 % powder Generic drug:  miconazole Apply topically as needed for itching.       Allergies:  Allergies  Allergen Reactions  . Ampicillin     Zpac  . Sulfa Antibiotics     Family History: Family History  Problem Relation Age of Onset  . Diabetes Mother   . Diabetes Sister        x2  . Breast cancer Sister   . Heart disease Father   . Heart disease  Brother        x3  . Bladder Cancer Neg Hx   . Kidney cancer Neg Hx   . Prostate cancer Neg Hx     Social History:  reports that she has never smoked. She has never used smokeless tobacco. She reports that she does not drink alcohol or use drugs.  ROS: UROLOGY Frequent Urination?: No Hard to postpone urination?: No Burning/pain with urination?: No Get up at night to urinate?: No Leakage of urine?: No Urine stream starts and stops?: No Trouble starting stream?: No Do you have to strain to urinate?: No Blood in urine?: No Urinary tract infection?: No Sexually transmitted disease?: No Injury to kidneys or bladder?: No Painful intercourse?: No Weak stream?: No Currently pregnant?: No Vaginal bleeding?: No Last menstrual period?: n  Gastrointestinal Nausea?: No Vomiting?: No Indigestion/heartburn?: No Diarrhea?: No Constipation?: No  Constitutional Fever: No Night sweats?: No Weight loss?: No Fatigue?: No  Skin Skin rash/lesions?: No Itching?: No  Eyes Blurred vision?: No Double vision?: No  Ears/Nose/Throat Sore throat?: No Sinus problems?: No  Hematologic/Lymphatic Swollen glands?: No Easy bruising?: No  Cardiovascular Leg swelling?: No Chest pain?: No  Respiratory Cough?: No Shortness of breath?: No  Endocrine Excessive thirst?: No  Musculoskeletal Back pain?: No Joint pain?: No  Neurological Headaches?: No Dizziness?: No  Psychologic Depression?: No Anxiety?: No  Physical Exam: BP (!) 109/54   Pulse 77   Ht  (1.651 m)   Wt 175 lb (79.4 kg)   BMI 29.12 kg/m   Constitutional: Well nourished. Alert and oriented, No acute distress. HEENT: [place] AT, moist mucus membranes. Trachea midline, no masses. Cardiovascular: No clubbing, cyanosis, or edema. Respiratory: Normal respiratory effort, no increased work of breathing. Skin: No rashes, bruises or suspicious lesions. Lymph: No cervical or inguinal adenopathy. Neurologic:  Grossly intact, no focal deficits, moving all 4 extremities. Psychiatric: Normal mood and affect.   Assessment & Plan:    1. Foul smelling  urine  - Resolved   2. MIxed incontinence  - Patient states that she is not bothered with incontinence at this time  3. Vaginal atrophy  - Patient states that she is out of the cream and does not need a refill  4. Cystocele  - Patient not having issues with cystocele at this time                                  Return if symptoms worsen or fail to improve.  These notes generated with voice recognition software. I apologize for typographical errors.  Michiel Cowboy, PA-C  [facility] [address] [place], [place] [address] [phone]

## 2017-08-11 ENCOUNTER — Ambulatory Visit (INDEPENDENT_AMBULATORY_CARE_PROVIDER_SITE_OTHER): Payer: Medicare (Managed Care) | Admitting: Urology

## 2017-08-11 ENCOUNTER — Encounter: Payer: Self-pay | Admitting: Urology

## 2017-08-11 VITALS — BP 109/54 | HR 77 | Ht 65.0 in | Wt 175.0 lb

## 2017-08-11 DIAGNOSIS — N952 Postmenopausal atrophic vaginitis: Secondary | ICD-10-CM

## 2017-08-11 DIAGNOSIS — N8111 Cystocele, midline: Secondary | ICD-10-CM

## 2017-08-11 DIAGNOSIS — R8290 Unspecified abnormal findings in urine: Secondary | ICD-10-CM | POA: Diagnosis not present

## 2017-08-11 DIAGNOSIS — N3946 Mixed incontinence: Secondary | ICD-10-CM | POA: Diagnosis not present

## 2017-08-11 LAB — BLADDER SCAN AMB NON-IMAGING: Scan Result: 0

## 2018-02-14 ENCOUNTER — Ambulatory Visit (INDEPENDENT_AMBULATORY_CARE_PROVIDER_SITE_OTHER): Payer: Medicare (Managed Care) | Admitting: Vascular Surgery

## 2018-02-14 ENCOUNTER — Encounter (INDEPENDENT_AMBULATORY_CARE_PROVIDER_SITE_OTHER): Payer: Self-pay | Admitting: Vascular Surgery

## 2018-02-14 VITALS — BP 144/64 | HR 90 | Resp 16 | Ht 65.0 in | Wt 180.0 lb

## 2018-02-14 DIAGNOSIS — E785 Hyperlipidemia, unspecified: Secondary | ICD-10-CM | POA: Diagnosis not present

## 2018-02-14 DIAGNOSIS — R600 Localized edema: Secondary | ICD-10-CM

## 2018-02-14 DIAGNOSIS — M79604 Pain in right leg: Secondary | ICD-10-CM

## 2018-02-14 DIAGNOSIS — M79605 Pain in left leg: Secondary | ICD-10-CM

## 2018-02-14 DIAGNOSIS — E118 Type 2 diabetes mellitus with unspecified complications: Secondary | ICD-10-CM | POA: Diagnosis not present

## 2018-02-14 NOTE — Progress Notes (Signed)
Subjective:    Patient ID: [name], female    DOB: Mar 17, 1932, 84 y.o.   MRN: [id number] Chief Complaint  Patient presents with  . New Patient (Initial Visit)    leg pain and swelling   Presents as a new patient referred by Dr. Dareen Piano for evaluation of bilateral lower extremity pain and swelling.  The patient states she has been experiencing swelling to her legs for "months".  The patient notes the swelling is worse towards the end of the day.  The edema is associated with some discomfort.  The patient experiences pain in her bilateral knees and feet with activity.  The patient denies any other claudication-like symptoms, rest pain or ulceration to the lower extremity.  The patient denies any surgery or trauma to the bilateral lower extremity.  The patient does not engage in conservative therapy at this time including wearing medical grade 1 compression socks or elevating her legs.  The patient notes that her discomfort has progressed to the point that she is unable to function on a daily basis and feels that her symptoms are lifestyle limiting.  The patient denies any fever, nausea or vomiting.  Review of Systems  Constitutional: Negative.   HENT: Negative.   Eyes: Negative.   Respiratory: Negative.   Cardiovascular: Positive for leg swelling.       Bilateral knee pain Bilateral foot pain  Gastrointestinal: Negative.   Endocrine: Negative.   Genitourinary: Negative.   Musculoskeletal: Negative.   Skin: Negative.   Allergic/Immunologic: Negative.   Neurological: Negative.   Hematological: Negative.   Psychiatric/Behavioral: Negative.       Objective:   Physical Exam  Constitutional: She is oriented to person, place, and time. She appears well-developed and well-nourished. No distress.  HENT:  Head: Normocephalic and atraumatic.  Eyes: Pupils are equal, round, and reactive to light.  Neck: Normal range of motion.  Cardiovascular: Normal rate, regular rhythm, normal  heart sounds and intact distal pulses.  Pulses:      Radial pulses are 2+ on the right side, and 2+ on the left side.  Hard to palpate pedal pulses however her bilateral feet are warm.  Pulmonary/Chest: Effort normal and breath sounds normal.  Musculoskeletal: Normal range of motion. She exhibits edema (Mild to moderate nonpitting edema noted bilaterally).  Neurological: She is alert and oriented to person, place, and time.  Skin: She is not diaphoretic.  Less than 1 cm diffuse varicosities noted to the bilateral lower extremity.  There is moderate stasis dermatitis bilaterally.  There is no active cellulitis.  There are no active ulcerations.  Psychiatric: She has a normal mood and affect. Her behavior is normal. Judgment and thought content normal.  Vitals reviewed.  BP (!) 144/64 (BP Location: Left Arm)   Pulse 90   Resp 16   Ht 5\' 5"  (1.651 m)   Wt 180 lb (81.6 kg)   BMI 29.95 kg/m   Past Medical History:  Diagnosis Date  . Anxiety   . Depression   . GERD (gastroesophageal reflux disease)   . Hip fracture (HCC)   . Hyperlipemia   . Neuropathy   . Vertigo    Social History   Socioeconomic History  . Marital status: Widowed    Spouse name: Not on file  . Number of children: Not on file  . Years of education: Not on file  . Highest education level: Not on file  Occupational History  . Occupation: Retired  Engineer, production  .  Financial resource strain: Not on file  . Food insecurity:    Worry: Not on file    Inability: Not on file  . Transportation needs:    Medical: Not on file    Non-medical: Not on file  Tobacco Use  . Smoking status: Never Smoker  . Smokeless tobacco: Never Used  Substance and Sexual Activity  . Alcohol use: No    Alcohol/week: 0.0 oz  . Drug use: No  . Sexual activity: Not on file  Lifestyle  . Physical activity:    Days per week: Not on file    Minutes per session: Not on file  . Stress: Not on file  Relationships  . Social connections:     Talks on phone: Not on file    Gets together: Not on file    Attends religious service: Not on file    Active member of club or organization: Not on file    Attends meetings of clubs or organizations: Not on file    Relationship status: Not on file  . Intimate partner violence:    Fear of current or ex partner: Not on file    Emotionally abused: Not on file    Physically abused: Not on file    Forced sexual activity: Not on file  Other Topics Concern  . Not on file  Social History Narrative  . Not on file   Past Surgical History:  Procedure Laterality Date  . ABDOMINAL HYSTERECTOMY    . APPENDECTOMY    . BACK SURGERY     tumor removal bengin  . CHOLECYSTECTOMY     Family History  Problem Relation Age of Onset  . Diabetes Mother   . Diabetes Sister        x2  . Breast cancer Sister   . Heart disease Father   . Heart disease Brother        x3  . Bladder Cancer Neg Hx   . Kidney cancer Neg Hx   . Prostate cancer Neg Hx    Allergies  Allergen Reactions  . Ampicillin     Zpac  . Sulfa Antibiotics       Assessment & Plan:  Presents as a new patient referred by Dr. Dareen Piano for evaluation of bilateral lower extremity pain and swelling.  The patient states she has been experiencing swelling to her legs for "months".  The patient notes the swelling is worse towards the end of the day.  The edema is associated with some discomfort.  The patient experiences pain in her bilateral knees and feet with activity.  The patient denies any other claudication-like symptoms, rest pain or ulceration to the lower extremity.  The patient denies any surgery or trauma to the bilateral lower extremity.  The patient does not engage in conservative therapy at this time including wearing medical grade 1 compression socks or elevating her legs.  The patient notes that her discomfort has progressed to the point that she is unable to function on a daily basis and feels that her symptoms are lifestyle  limiting.  The patient denies any fever, nausea or vomiting.  1. Bilateral lower extremity edema - New Patient presents with bilateral lower extremity edema notable for skin changes on physical exam The patient was encouraged to wear graduated compression stockings (20-30 mmHg) on a daily basis. The patient was instructed to begin wearing the stockings first thing in the morning and removing them in the evening. The patient was instructed specifically not to  sleep in the stockings. Prescription given. In addition, behavioral modification including elevation during the day will be initiated. Anti-inflammatories for pain. At this point, the patient is refusing to wear compression socks.  I had a long discussion with the patient in regard to the pathophysiology of lymphedema and the risk of recurrent cellulitis, worsening of neuropathy and wound formation that could occur in the future if the edema to her legs is not controlled. The patient continues to refuse to wear compression socks. I will bring the patient back to undergo a bilateral venous duplex exam to rule out any contributing venous disease however if the patient does not engage in conservative therapy it will be very hard to control her edema and this was explained to her. The patient expresses her understanding and continues to refuse to engage in conservative therapy.  - VAS [place] LOWER EXTREMITY VENOUS REFLUX; Future  2. Lower extremity pain, bilateral - New The patient has multiple risk factors for peripheral artery disease Patient with pain to her bilateral knees and feet. Unable to palpate pedal pulses on exam I will bring the patient back and have her undergo bilateral ABI to assess for any contributing peripheral artery disease I have discussed with the patient at length the risk factors for and pathogenesis of atherosclerotic disease and encouraged a healthy diet, regular exercise regimen and blood pressure / glucose control.  The  patient was encouraged to call the office in the interim if he experiences any claudication like symptoms, rest pain or ulcers to his feet / toes.  - VAS [place] ABI WITH/WO TBI; Future  3. Type 2 diabetes mellitus with complication, unspecified whether long term insulin use (HCC) - Stable Encouraged good control as its slows the progression of atherosclerotic disease  4. Hyperlipidemia, unspecified hyperlipidemia type - Stable Encouraged good control as its slows the progression of atherosclerotic disease  Current Outpatient Medications on File Prior to Visit  Medication Sig Dispense Refill  . acetaminophen (TYLENOL) 325 MG tablet Take 650 mg by mouth every 4 (four) hours as needed.    Marland Kitchen aspirin EC 81 MG tablet Take 81 mg by mouth daily.    . busPIRone (BUSPAR) 10 MG tablet Take 10 mg by mouth 2 (two) times daily.    . cetirizine (ZYRTEC) 10 MG tablet Take 10 mg by mouth daily.    . Cholecalciferol 50000 units TABS Take 2,000 Units by mouth daily. On Monday     . citalopram (CELEXA) 10 MG tablet Take 10 mg by mouth daily.    . diphenhydrAMINE (BENADRYL) 2 % cream Apply topically 3 (three) times daily as needed for itching.    . diphenhydrAMINE (BENADRYL) 25 mg capsule Take 25 mg by mouth every 6 (six) hours as needed.    . divalproex (DEPAKOTE SPRINKLE) 125 MG capsule Take 250 mg by mouth 2 (two) times daily.    . fluticasone (FLONASE) 50 MCG/ACT nasal spray Place into both nostrils daily.    Marland Kitchen gabapentin (NEURONTIN) 300 MG capsule Take 300 mg by mouth 3 (three) times daily.    Marland Kitchen glipiZIDE (GLUCOTROL) 5 MG tablet Take 2.5 mg by mouth 2 (two) times daily before a meal.     . loperamide (IMODIUM) 2 MG capsule Take by mouth as needed for diarrhea or loose stools.    Marland Kitchen losartan-hydrochlorothiazide (HYZAAR) 100-12.5 MG tablet losartan 100 mg-hydrochlorothiazide 12.5 mg tablet    . olopatadine (PATANOL) 0.1 % ophthalmic solution Place 1 drop into both eyes daily.    Marland Kitchen  ondansetron (ZOFRAN) 4 MG  tablet Take 4 mg by mouth every 8 (eight) hours as needed for nausea or vomiting.    Marland Kitchen. oxybutynin (DITROPAN) 5 MG tablet TAKE 1 TABLET BY MOUTH TWICE A DAY 60 tablet 6  . Probiotic Product (PROBIOTIC-10 PO) Take by mouth daily.    Marland Kitchen. telmisartan (MICARDIS) 40 MG tablet Take by mouth.    . traMADol (ULTRAM) 50 MG tablet Take 1 tablet (50 mg total) by mouth every 4 (four) hours as needed. 30 tablet 0  . ALPRAZolam (XANAX) 0.25 MG tablet Take 1 tablet (0.25 mg total) by mouth at bedtime as needed for anxiety. (Patient not taking: Reported on 02/14/2018) 30 tablet 0  . atorvastatin (LIPITOR) 40 MG tablet atorvastatin 40 mg tablet    . clotrimazole (LOTRIMIN) 1 % cream Apply 1 application topically 2 (two) times daily.    Marland Kitchen. conjugated estrogens (PREMARIN) vaginal cream Place 1 Applicatorful vaginally daily. Apply 0.5mg  (pea-sized amount)  just inside the vaginal introitus with a finger-tip every night for two weeks and then Monday, Wednesday and Friday nights. (Patient not taking: Reported on 08/11/2017) 30 g 12  . CycloSPORINE (RESTASIS OP) Restasis 0.05 % eye drops in a dropperette    . estradiol (ESTRACE) 0.1 MG/GM vaginal cream estradiol 0.01% (0.1 mg/gram) vaginal cream    . gentamicin ointment (GARAMYCIN) 0.1 % gentamicin 0.1 % topical ointment    . guaifenesin (ROBITUSSIN) 100 MG/5ML syrup Take 200 mg by mouth 3 (three) times daily as needed for cough.    Marland Kitchen. HYDROcodone-acetaminophen (NORCO/VICODIN) 5-325 MG tablet Take 1-2 tablets by mouth every 4 (four) hours as needed for moderate pain. (Patient not taking: Reported on 08/11/2017) 30 tablet 0  . ketotifen (ZADITOR) 0.025 % ophthalmic solution Place 1 drop into both eyes 2 (two) times daily.     . meclizine (ANTIVERT) 25 MG tablet Take 25 mg by mouth daily as needed for dizziness.     . metFORMIN (GLUCOPHAGE) 500 MG tablet metformin 500 mg tablet    . metoCLOPramide (REGLAN) 5 MG tablet Take 5 mg by mouth 4 (four) times daily.    . miconazole  (ZEASORB-AF) 2 % powder Apply topically as needed for itching.    Marland Kitchen. ofloxacin (FLOXIN) 0.3 % OTIC solution ofloxacin 0.3 % ear drops    . omeprazole (PRILOSEC) 20 MG capsule Take 20 mg by mouth daily.     Marland Kitchen. senna-docusate (SENOKOT-S) 8.6-50 MG tablet Take 2 tablets by mouth 2 (two) times daily.    . sitaGLIPtin (JANUVIA) 100 MG tablet Januvia 100 mg tablet    . Tetrahydrozoline HCl (EYE DROPS OP) neomycin-polymyxin-dexameth 3.5 mg/mL-10,000 unit/mL-0.1% eye drops    . trimethoprim-polymyxin b (POLYTRIM) ophthalmic solution polymyxin B sulfate 10,000 unit-trimethoprim 1 mg/mL eye drops     No current facility-administered medications on file prior to visit.    There are no Patient Instructions on file for this visit. No follow-ups on file.  Dquan Cortopassi A Aiya Keach, PA-C

## 2018-04-14 ENCOUNTER — Ambulatory Visit (INDEPENDENT_AMBULATORY_CARE_PROVIDER_SITE_OTHER): Payer: Medicare (Managed Care) | Admitting: Vascular Surgery

## 2018-04-14 ENCOUNTER — Encounter (INDEPENDENT_AMBULATORY_CARE_PROVIDER_SITE_OTHER): Payer: Medicare (Managed Care)

## 2018-05-04 ENCOUNTER — Encounter (INDEPENDENT_AMBULATORY_CARE_PROVIDER_SITE_OTHER): Payer: Medicare (Managed Care)

## 2018-05-04 ENCOUNTER — Ambulatory Visit (INDEPENDENT_AMBULATORY_CARE_PROVIDER_SITE_OTHER): Payer: Medicare (Managed Care) | Admitting: Vascular Surgery

## 2018-06-14 ENCOUNTER — Other Ambulatory Visit: Payer: Self-pay

## 2018-06-14 ENCOUNTER — Emergency Department
Admission: EM | Admit: 2018-06-14 | Discharge: 2018-06-14 | Disposition: A | Discharge status: Home / Self Care | Payer: Medicare (Managed Care) | Attending: Emergency Medicine | Admitting: Emergency Medicine

## 2018-06-14 ENCOUNTER — Encounter: Payer: Self-pay | Admitting: Emergency Medicine

## 2018-06-14 DIAGNOSIS — Z7984 Long term (current) use of oral hypoglycemic drugs: Secondary | ICD-10-CM | POA: Insufficient documentation

## 2018-06-14 DIAGNOSIS — Z79899 Other long term (current) drug therapy: Secondary | ICD-10-CM | POA: Diagnosis not present

## 2018-06-14 DIAGNOSIS — R197 Diarrhea, unspecified: Secondary | ICD-10-CM | POA: Diagnosis not present

## 2018-06-14 DIAGNOSIS — L03116 Cellulitis of left lower limb: Secondary | ICD-10-CM | POA: Insufficient documentation

## 2018-06-14 DIAGNOSIS — E119 Type 2 diabetes mellitus without complications: Secondary | ICD-10-CM | POA: Insufficient documentation

## 2018-06-14 DIAGNOSIS — L03119 Cellulitis of unspecified part of limb: Secondary | ICD-10-CM

## 2018-06-14 HISTORY — DX: Type 2 diabetes mellitus without complications: E11.9

## 2018-06-14 MED ORDER — DOXYCYCLINE HYCLATE 100 MG PO TABS: 100.0000 mg | ORAL_TABLET | Freq: Two times a day (BID) | ORAL | 0 refills | Status: DC

## 2018-06-14 MED ORDER — ACETAMINOPHEN 500 MG PO TABS
1000.0000 mg | ORAL_TABLET | Freq: Once | ORAL | Status: AC
  Administered 2018-06-14: 1000 mg via ORAL
  Filled 2018-06-14: qty 2

## 2018-06-14 MED ORDER — CIPROFLOXACIN HCL 500 MG PO TABS
750.0000 mg | ORAL_TABLET | Freq: Once | ORAL | Status: AC
  Administered 2018-06-14: 750 mg via ORAL
  Filled 2018-06-14: qty 2

## 2018-06-14 NOTE — ED Notes (Signed)
Pt assisted to the bathroom at this time, attempted to collect stool sample without success. Will attempt to collect stool sample again when patient is ready.

## 2018-06-14 NOTE — Discharge Instructions (Signed)
Today I treated your diarrhea with antibiotics.  You are fully treated and do not need any further antibiotics.  Please make sure you remain well-hydrated and use 2 mg of over-the-counter loperamide (Imodium) after every loose bowel movement for the next 2 days.  Follow-up with your primary care physician as needed and return to the emergency department for any issues whatsoever.  It was a pleasure to take care of you today, and thank you for coming to our emergency department.  If you have any questions or concerns before leaving please ask the nurse to grab me and I'm more than happy to go through your aftercare instructions again.  If you were prescribed any opioid pain medication today such as Norco, Vicodin, Percocet, morphine, hydrocodone, or oxycodone please make sure you do not drive when you are taking this medication as it can alter your ability to drive safely.  If you have any concerns once you are home that you are not improving or are in fact getting worse before you can make it to your follow-up appointment, please do not hesitate to call 911 and come back for further evaluation.  Merrily BrittleNeil Charnita Trudel, MD

## 2018-06-14 NOTE — ED Notes (Signed)
This RN to bedside due to patient calling out, pt now c/o L ankle pain, no obvious deformity or injury, pt ambulatory earlier without difficulty or complaint. Will notify MD.

## 2018-06-14 NOTE — ED Notes (Signed)
Lights flickered, this RN to bedside to reassess patient. Pt continues to rest in bed with NAD noted at this time. Pt given a flash light.

## 2018-06-14 NOTE — ED Notes (Signed)
NAD noted at time of D/C. Pt taken to car via wheelchair by this RN. Pt D/C into the care of Lora Powers from [facility]. Pt/caregiver denies any comments concerns regarding D/C.

## 2018-06-14 NOTE — ED Notes (Signed)
CSW at bedside at this time. Pt resting in bed, comfortable at this time. D/C pending CSW consult. Will continue to monitor for further patient needs.

## 2018-06-14 NOTE — ED Notes (Signed)
This RN to bedside at this time, introduced self to patient. Pt is alert and oriented, resting in bed with NAD noted. Pt is calm, cooperative, and pleasant with this RN.
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
## 2018-06-14 NOTE — ED Triage Notes (Signed)
Pt arrives via [facility] from [facility] on [address] in [place]. [facility] that [facility] [place] [facility] at the facility to keep taking pt to the bathroom. Pt is new to [facility] as of one week ago. Diarrhea was occurring [facility], as patient had diarrhea while at [facility] (prior to [facility] placement).   Imodium yesterday and today with improvement. Pt reports last episode of diarrhea was first thing this morning. Pt reports she was at hospital last week for the diarrhea.   Pt reportedly had UA collected yesterday that has not yet resulted.  Pt is A&O to self and situation at this time.

## 2018-06-14 NOTE — ED Notes (Signed)
This RN spoke with patient's son after CSW spoke with patient's son. Pt's son given an update on patient condition, pt's son states that diarrhea has been on ongoing issue since [facility] was [facility].

## 2018-06-14 NOTE — ED Provider Notes (Signed)
[facility] [address] Emergency Department Provider Note  ____________________________________________   First MD Initiated Contact with Patient 06/14/18 1115     (approximate)  I have reviewed the triage vital signs and the nursing notes.   HISTORY  Chief Complaint Diarrhea   HPI [name] is a 84 y.o. female who sent to the emergency department via EMS from her nursing home for evaluation of diarrhea.  According to EMS the patient has had profound diarrhea for the past several days and has been difficult to care for.  The patient herself says she has had 3 episodes today.  No fevers or chills.  Mild cramping abdominal pain that is worse when defecating improved thereafter.  She did have a recent hospital stay.  She is not sure if she is had recent antibiotics.  No recent shellfish.  No raw oysters.  Her symptoms have been intermittent moderate to severe.    Past Medical History:  Diagnosis Date  . Anxiety   . Depression   . Diabetes mellitus without complication (HCC)   . GERD (gastroesophageal reflux disease)   . Hip fracture (HCC)   . Hyperlipemia   . Neuropathy   . Vertigo     Patient Active Problem List   Diagnosis Date Noted  . Sepsis (HCC) 05/21/2017  . Aspiration pneumonia (HCC) 05/21/2017  . GERD (gastroesophageal reflux disease) 05/21/2017  . HLD (hyperlipidemia) 05/21/2017  . Depression 05/21/2017  . Anxiety 05/21/2017  . Elevated troponin 05/21/2017  . Diabetes (HCC) 05/21/2017  . Right humeral fracture 05/21/2017  . Nausea and vomiting 03/31/2015  . Microcytic anemia 03/31/2015    Past Surgical History:  Procedure Laterality Date  . ABDOMINAL HYSTERECTOMY    . APPENDECTOMY    . BACK SURGERY     tumor removal bengin  . CHOLECYSTECTOMY      Prior to Admission medications   Medication Sig Start Date End Date Taking? Authorizing Provider  acetaminophen (TYLENOL) 325 MG tablet Take 650 mg by mouth every 4 (four) hours as needed.     [provider]  ALPRAZolam Prudy Feeler) 0.25 MG tablet Take 1 tablet (0.25 mg total) by mouth at bedtime as needed for anxiety. Patient not taking: Reported on 02/14/2018 05/24/17   Auburn Bilberry, MD  aspirin EC 81 MG tablet Take 81 mg by mouth daily.    [provider]  atorvastatin (LIPITOR) 40 MG tablet atorvastatin 40 mg tablet    [provider]  busPIRone (BUSPAR) 10 MG tablet Take 10 mg by mouth 2 (two) times daily.    [provider]  cetirizine (ZYRTEC) 10 MG tablet Take 10 mg by mouth daily.    [provider]  Cholecalciferol 50000 units TABS Take 2,000 Units by mouth daily. On Monday     [provider]  citalopram (CELEXA) 10 MG tablet Take 10 mg by mouth daily.    [provider]  clotrimazole (LOTRIMIN) 1 % cream Apply 1 application topically 2 (two) times daily.    [provider]  conjugated estrogens (PREMARIN) vaginal cream Place 1 Applicatorful vaginally daily. Apply 0.5mg  (pea-sized amount)  just inside the vaginal introitus with a finger-tip every night for two weeks and then Monday, Wednesday and Friday nights. Patient not taking: Reported on 08/11/2017 05/10/17   Michiel Cowboy A, PA-C  CycloSPORINE (RESTASIS OP) Restasis 0.05 % eye drops in a dropperette    [provider]  diphenhydrAMINE (BENADRYL) 2 % cream Apply topically 3 (three) times daily as  needed for itching.    [provider]  diphenhydrAMINE (BENADRYL) 25 mg capsule Take 25 mg by mouth every 6 (six) hours as needed.    [provider]  divalproex (DEPAKOTE SPRINKLE) 125 MG capsule Take 250 mg by mouth 2 (two) times daily.    [provider]  doxycycline (VIBRA-TABS) 100 MG tablet Take 1 tablet (100 mg total) by mouth 2 (two) times daily. 06/14/18   Merrily Brittle, MD  estradiol (ESTRACE) 0.1 MG/GM vaginal cream estradiol 0.01% (0.1 mg/gram) vaginal cream    [provider]  fluticasone (FLONASE) 50  MCG/ACT nasal spray Place into both nostrils daily.    [provider]  gabapentin (NEURONTIN) 300 MG capsule Take 300 mg by mouth 3 (three) times daily.    [provider]  gentamicin ointment (GARAMYCIN) 0.1 % gentamicin 0.1 % topical ointment    [provider]  glipiZIDE (GLUCOTROL) 5 MG tablet Take 2.5 mg by mouth 2 (two) times daily before a meal.     [provider]  guaifenesin (ROBITUSSIN) 100 MG/5ML syrup Take 200 mg by mouth 3 (three) times daily as needed for cough.    [provider]  HYDROcodone-acetaminophen (NORCO/VICODIN) 5-325 MG tablet Take 1-2 tablets by mouth every 4 (four) hours as needed for moderate pain. Patient not taking: Reported on 08/11/2017 05/24/17   Auburn Bilberry, MD  ketotifen (ZADITOR) 0.025 % ophthalmic solution Place 1 drop into both eyes 2 (two) times daily.     [provider]  loperamide (IMODIUM) 2 MG capsule Take by mouth as needed for diarrhea or loose stools.    [provider]  losartan-hydrochlorothiazide (HYZAAR) 100-12.5 MG tablet losartan 100 mg-hydrochlorothiazide 12.5 mg tablet    [provider]  meclizine (ANTIVERT) 25 MG tablet Take 25 mg by mouth daily as needed for dizziness.     [provider]  metFORMIN (GLUCOPHAGE) 500 MG tablet metformin 500 mg tablet    [provider]  metoCLOPramide (REGLAN) 5 MG tablet Take 5 mg by mouth 4 (four) times daily.    [provider]  miconazole (ZEASORB-AF) 2 % powder Apply topically as needed for itching.    [provider]  ofloxacin (FLOXIN) 0.3 % OTIC solution ofloxacin 0.3 % ear drops    [provider]  olopatadine (PATANOL) 0.1 % ophthalmic solution Place 1 drop into both eyes daily.    [provider]  omeprazole (PRILOSEC) 20 MG capsule Take 20 mg by mouth daily.     [provider]  ondansetron (ZOFRAN) 4 MG tablet Take 4 mg by mouth every 8 (eight) hours as needed  for nausea or vomiting.    [provider]  oxybutynin (DITROPAN) 5 MG tablet TAKE 1 TABLET BY MOUTH TWICE A DAY 05/06/16   Defrancesco, Prentice Docker, MD  Probiotic Product (PROBIOTIC-10 PO) Take by mouth daily.    [provider]  senna-docusate (SENOKOT-S) 8.6-50 MG tablet Take 2 tablets by mouth 2 (two) times daily.    [provider]  sitaGLIPtin (JANUVIA) 100 MG tablet Januvia 100 mg tablet    [provider]  telmisartan (MICARDIS) 40 MG tablet Take by mouth. 06/09/17 06/09/18  [provider]  Tetrahydrozoline HCl (EYE DROPS OP) neomycin-polymyxin-dexameth 3.5 mg/mL-10,000 unit/mL-0.1% eye drops    [provider]  traMADol (ULTRAM) 50 MG tablet Take 1 tablet (50 mg total) by mouth every 4 (four) hours as needed. 05/24/17   Auburn Bilberry, MD  trimethoprim-polymyxin b Joaquim Lai) ophthalmic  solution polymyxin B sulfate 10,000 unit-trimethoprim 1 mg/mL eye drops    [provider]    Allergies Ampicillin and Sulfa antibiotics  Family History  Problem Relation Age of Onset  . Diabetes Mother   . Diabetes Sister        x2  . Breast cancer Sister   . Heart disease Father   . Heart disease Brother        x3  . Bladder Cancer Neg Hx   . Kidney cancer Neg Hx   . Prostate cancer Neg Hx     Social History Social History   Tobacco Use  . Smoking status: Never Smoker  . Smokeless tobacco: Never Used  Substance Use Topics  . Alcohol use: No    Alcohol/week: 0.0 standard drinks  . Drug use: No    Review of Systems Constitutional: No fever/chills Eyes: No visual changes. ENT: No sore throat. Cardiovascular: Denies chest pain. Respiratory: Denies shortness of breath. Gastrointestinal: Positive for abdominal pain.  No nausea, no vomiting.  Positive for diarrhea.  No constipation. Genitourinary: Negative for dysuria. Musculoskeletal: Negative for back pain. Skin: Negative for rash. Neurological: Negative for headaches, focal  weakness or numbness.   ____________________________________________   PHYSICAL EXAM:  VITAL SIGNS: ED Triage Vitals [06/14/18 1100]  Enc Vitals Group     BP (!) 171/72     Pulse Rate 68     Resp 18     Temp (!) 97.5 F (36.4 C)     Temp Source Oral     SpO2 98 %     Weight 183 lb (83 kg)     Height 5' 5.5" (1.664 m)     Head Circumference      Peak Flow      Pain Score 0     Pain Loc      Pain Edu?      Excl. in GC?     Constitutional: Alert and oriented x4 joking laughing pleasant cooperative speaks in full clear sentences Eyes: PERRL EOMI. Head: Atraumatic. Nose: No congestion/rhinnorhea. Mouth/Throat: No trismus Neck: No stridor.   Cardiovascular: Normal rate, regular rhythm. Grossly normal heart sounds.  Good peripheral circulation. Respiratory: Normal respiratory effort.  No retractions. Lungs CTAB and moving good air Gastrointestinal: Soft obese nondistended nontender no rebound or guarding no peritonitis not hyperactive bowel sounds Musculoskeletal: No lower extremity edema   Neurologic:  Normal speech and language. No gross focal neurologic deficits are appreciated. Skin:  Skin is warm, dry and intact. No rash noted. Psychiatric: Mood and affect are normal. Speech and behavior are normal.    ____________________________________________   DIFFERENTIAL includes but not limited to  C. difficile, viral diarrhea, bacterial diarrhea, functional diarrhea ____________________________________________   LABS (all labs ordered are listed, but only abnormal results are displayed)  Labs Reviewed - No data to display   __________________________________________  EKG   ____________________________________________  RADIOLOGY   ____________________________________________   PROCEDURES  Procedure(s) performed: no  Procedures  Critical Care performed: no  ____________________________________________   INITIAL IMPRESSION / ASSESSMENT AND PLAN / ED  COURSE  Pertinent labs & imaging results that were available during my care of the patient were reviewed by me and considered in my medical decision making (see chart for details).   As part of my medical decision making, I reviewed the following data within the electronic MEDICAL RECORD NUMBER History obtained from family if available, nursing notes, old chart and ekg, as well as notes from prior ED visits.  Patient is hemodynamically stable and very well-appearing.  She attempted multiple times to give a stool sample over several hours and is unable to do so.  As she is afebrile, nontoxic, and unable to defecate I do not believe this is C. difficile.  She was treated with 750 mg of ciprofloxacin x1 in case this represents bacterial diarrhea.  On discharge the patient did note that she has had some mild swelling to her left ankle recently and it is mildly warm and swollen consistent with early cellulitis.  I will treat her with doxycycline and discharged back to her facility.      ____________________________________________   FINAL CLINICAL IMPRESSION(S) / ED DIAGNOSES  Final diagnoses:  Diarrhea of presumed infectious origin  Ankle cellulitis      NEW MEDICATIONS STARTED DURING THIS VISIT:  Discharge Medication List as of 06/14/2018 12:42 PM       Note:  This document was prepared using Dragon voice recognition software and may include unintentional dictation errors.     Merrily Brittleifenbark, Wayne Wicklund, MD 06/16/18 2233

## 2018-06-14 NOTE — ED Notes (Signed)
This RN attempted to get in touch with patient's son, Gery PrayBarry. Called both home and mobile numbers listed. Did not leave voicemail at this time d/t inability to confirm identity of son prior to giving protected patient information. Primary RN taking over aware.

## 2018-06-14 NOTE — ED Notes (Signed)
[facility] notified of patient's D/C. Per facility en route to pick up patient.

## 2018-07-26 ENCOUNTER — Encounter (INDEPENDENT_AMBULATORY_CARE_PROVIDER_SITE_OTHER): Payer: Medicare (Managed Care)

## 2018-07-26 ENCOUNTER — Ambulatory Visit (INDEPENDENT_AMBULATORY_CARE_PROVIDER_SITE_OTHER): Payer: Medicare (Managed Care) | Admitting: Vascular Surgery

## 2018-08-11 ENCOUNTER — Other Ambulatory Visit: Payer: Self-pay

## 2018-08-11 ENCOUNTER — Emergency Department: Payer: Medicare Other

## 2018-08-11 ENCOUNTER — Encounter: Payer: Self-pay | Admitting: Emergency Medicine

## 2018-08-11 ENCOUNTER — Inpatient Hospital Stay
Admission: EM | Admit: 2018-08-11 | Discharge: 2018-08-24 | DRG: 871 | Disposition: A | Discharge status: Skilled Nursing Facility | Payer: Medicare Other | Attending: Internal Medicine | Admitting: Internal Medicine

## 2018-08-11 DIAGNOSIS — R4182 Altered mental status, unspecified: Secondary | ICD-10-CM

## 2018-08-11 DIAGNOSIS — R159 Full incontinence of feces: Secondary | ICD-10-CM | POA: Diagnosis not present

## 2018-08-11 DIAGNOSIS — Z79899 Other long term (current) drug therapy: Secondary | ICD-10-CM | POA: Diagnosis not present

## 2018-08-11 DIAGNOSIS — F419 Anxiety disorder, unspecified: Secondary | ICD-10-CM | POA: Diagnosis present

## 2018-08-11 DIAGNOSIS — J441 Chronic obstructive pulmonary disease with (acute) exacerbation: Secondary | ICD-10-CM | POA: Diagnosis present

## 2018-08-11 DIAGNOSIS — F0391 Unspecified dementia with behavioral disturbance: Secondary | ICD-10-CM | POA: Diagnosis present

## 2018-08-11 DIAGNOSIS — Z833 Family history of diabetes mellitus: Secondary | ICD-10-CM | POA: Diagnosis not present

## 2018-08-11 DIAGNOSIS — R531 Weakness: Secondary | ICD-10-CM | POA: Diagnosis not present

## 2018-08-11 DIAGNOSIS — R05 Cough: Secondary | ICD-10-CM

## 2018-08-11 DIAGNOSIS — K219 Gastro-esophageal reflux disease without esophagitis: Secondary | ICD-10-CM | POA: Diagnosis present

## 2018-08-11 DIAGNOSIS — I10 Essential (primary) hypertension: Secondary | ICD-10-CM | POA: Diagnosis present

## 2018-08-11 DIAGNOSIS — Z7982 Long term (current) use of aspirin: Secondary | ICD-10-CM | POA: Diagnosis not present

## 2018-08-11 DIAGNOSIS — R410 Disorientation, unspecified: Secondary | ICD-10-CM

## 2018-08-11 DIAGNOSIS — Z7984 Long term (current) use of oral hypoglycemic drugs: Secondary | ICD-10-CM | POA: Diagnosis not present

## 2018-08-11 DIAGNOSIS — I452 Bifascicular block: Secondary | ICD-10-CM | POA: Diagnosis present

## 2018-08-11 DIAGNOSIS — Z515 Encounter for palliative care: Secondary | ICD-10-CM | POA: Diagnosis not present

## 2018-08-11 DIAGNOSIS — I248 Other forms of acute ischemic heart disease: Secondary | ICD-10-CM | POA: Diagnosis not present

## 2018-08-11 DIAGNOSIS — G92 Toxic encephalopathy: Secondary | ICD-10-CM | POA: Diagnosis present

## 2018-08-11 DIAGNOSIS — J690 Pneumonitis due to inhalation of food and vomit: Secondary | ICD-10-CM | POA: Diagnosis present

## 2018-08-11 DIAGNOSIS — R32 Unspecified urinary incontinence: Secondary | ICD-10-CM | POA: Diagnosis not present

## 2018-08-11 DIAGNOSIS — F03918 Unspecified dementia, unspecified severity, with other behavioral disturbance: Secondary | ICD-10-CM

## 2018-08-11 DIAGNOSIS — G9340 Encephalopathy, unspecified: Secondary | ICD-10-CM | POA: Diagnosis present

## 2018-08-11 DIAGNOSIS — Z90710 Acquired absence of both cervix and uterus: Secondary | ICD-10-CM | POA: Diagnosis not present

## 2018-08-11 DIAGNOSIS — Z803 Family history of malignant neoplasm of breast: Secondary | ICD-10-CM | POA: Diagnosis not present

## 2018-08-11 DIAGNOSIS — Z882 Allergy status to sulfonamides status: Secondary | ICD-10-CM

## 2018-08-11 DIAGNOSIS — A4189 Other specified sepsis: Secondary | ICD-10-CM | POA: Diagnosis not present

## 2018-08-11 DIAGNOSIS — A419 Sepsis, unspecified organism: Secondary | ICD-10-CM | POA: Diagnosis present

## 2018-08-11 DIAGNOSIS — Z0189 Encounter for other specified special examinations: Secondary | ICD-10-CM

## 2018-08-11 DIAGNOSIS — Z23 Encounter for immunization: Secondary | ICD-10-CM

## 2018-08-11 DIAGNOSIS — R059 Cough, unspecified: Secondary | ICD-10-CM

## 2018-08-11 DIAGNOSIS — Z66 Do not resuscitate: Secondary | ICD-10-CM | POA: Diagnosis present

## 2018-08-11 DIAGNOSIS — Z1389 Encounter for screening for other disorder: Secondary | ICD-10-CM

## 2018-08-11 DIAGNOSIS — J9622 Acute and chronic respiratory failure with hypercapnia: Secondary | ICD-10-CM | POA: Diagnosis present

## 2018-08-11 DIAGNOSIS — E1140 Type 2 diabetes mellitus with diabetic neuropathy, unspecified: Secondary | ICD-10-CM | POA: Diagnosis present

## 2018-08-11 DIAGNOSIS — Z881 Allergy status to other antibiotic agents status: Secondary | ICD-10-CM

## 2018-08-11 DIAGNOSIS — J9621 Acute and chronic respiratory failure with hypoxia: Secondary | ICD-10-CM | POA: Diagnosis present

## 2018-08-11 DIAGNOSIS — Z9049 Acquired absence of other specified parts of digestive tract: Secondary | ICD-10-CM

## 2018-08-11 DIAGNOSIS — Z7189 Other specified counseling: Secondary | ICD-10-CM | POA: Diagnosis not present

## 2018-08-11 DIAGNOSIS — Z8249 Family history of ischemic heart disease and other diseases of the circulatory system: Secondary | ICD-10-CM | POA: Diagnosis not present

## 2018-08-11 DIAGNOSIS — E785 Hyperlipidemia, unspecified: Secondary | ICD-10-CM | POA: Diagnosis present

## 2018-08-11 DIAGNOSIS — J9602 Acute respiratory failure with hypercapnia: Secondary | ICD-10-CM | POA: Diagnosis not present

## 2018-08-11 DIAGNOSIS — D649 Anemia, unspecified: Secondary | ICD-10-CM | POA: Diagnosis present

## 2018-08-11 LAB — CBC
HCT: 34 % — ABNORMAL LOW (ref 35.0–47.0)
Hemoglobin: 10.5 g/dL — ABNORMAL LOW (ref 12.0–16.0)
MCH: 26 pg (ref 26.0–34.0)
MCHC: 30.9 g/dL — ABNORMAL LOW (ref 32.0–36.0)
MCV: 83.9 fL (ref 80.0–100.0)
PLATELETS: 257 10*3/uL (ref 150–440)
RBC: 4.06 MIL/uL (ref 3.80–5.20)
RDW: 14.4 % (ref 11.5–14.5)
WBC: 9.3 10*3/uL (ref 3.6–11.0)

## 2018-08-11 LAB — URINALYSIS, COMPLETE (UACMP) WITH MICROSCOPIC
Bilirubin Urine: NEGATIVE
GLUCOSE, UA: NEGATIVE mg/dL
KETONES UR: 5 mg/dL — AB
NITRITE: NEGATIVE
PROTEIN: 100 mg/dL — AB
Specific Gravity, Urine: 1.023 (ref 1.005–1.030)
pH: 5 (ref 5.0–8.0)

## 2018-08-11 LAB — TROPONIN I: Troponin I: <0.03 ng/mL (ref <0.03)

## 2018-08-11 LAB — BLOOD GAS, ARTERIAL
Acid-Base Excess: 2.8 mmol/L — ABNORMAL HIGH (ref 0.0–2.0)
Acid-Base Excess: 2.6 mmol/L — ABNORMAL HIGH (ref 0.0–2.0)
Allens test (pass/fail): POSITIVE — AB
Allens test (pass/fail): POSITIVE — AB
Bicarbonate: 29.7 mmol/L — ABNORMAL HIGH (ref 20.0–28.0)
Bicarbonate: 28.8 mmol/L — ABNORMAL HIGH (ref 20.0–28.0)
DELIVERY SYSTEMS: POSITIVE
EXPIRATORY PAP: 5
FIO2: 28
FIO2: 30
INSPIRATORY PAP: 10
O2 SAT: 94.2 %
O2 Saturation: 91.2 %
PATIENT TEMPERATURE: 37
PCO2 ART: 51 mmHg — AB (ref 32.0–48.0)
PO2 ART: 76 mmHg — AB (ref 83.0–108.0)
Patient temperature: 37
pCO2 arterial: 55 mmHg — ABNORMAL HIGH (ref 32.0–48.0)
pH, Arterial: 7.34 — ABNORMAL LOW (ref 7.350–7.450)
pH, Arterial: 7.36 (ref 7.350–7.450)
pO2, Arterial: 64 mmHg — ABNORMAL LOW (ref 83.0–108.0)

## 2018-08-11 LAB — GLUCOSE, CAPILLARY: Glucose-Capillary: 144 mg/dL — ABNORMAL HIGH (ref 70–99)

## 2018-08-11 LAB — CREATININE, SERUM
Creatinine, Ser: 0.63 mg/dL (ref 0.44–1.00)
GFR calc Af Amer: >60 mL/min (ref >60)
GFR calc non Af Amer: >60 mL/min (ref >60)

## 2018-08-11 LAB — CBC WITH DIFFERENTIAL/PLATELET
Basophils Absolute: 0 10*3/uL (ref 0–0.1)
Basophils Relative: 0 %
Eosinophils Absolute: 0.1 10*3/uL (ref 0–0.7)
Eosinophils Relative: 1 %
HCT: 25.4 % — ABNORMAL LOW (ref 35.0–47.0)
HEMOGLOBIN: 8.1 g/dL — AB (ref 12.0–16.0)
LYMPHS ABS: 0.8 10*3/uL — AB (ref 1.0–3.6)
Lymphocytes Relative: 11 %
MCH: 26.7 pg (ref 26.0–34.0)
MCHC: 31.9 g/dL — ABNORMAL LOW (ref 32.0–36.0)
MCV: 83.5 fL (ref 80.0–100.0)
MONOS PCT: 10 %
Monocytes Absolute: 0.7 10*3/uL (ref 0.2–0.9)
NEUTROS ABS: 5.4 10*3/uL (ref 1.4–6.5)
NEUTROS PCT: 78 %
PLATELETS: 200 10*3/uL (ref 150–440)
RBC: 3.04 MIL/uL — AB (ref 3.80–5.20)
RDW: 14.1 % (ref 11.5–14.5)
WBC: 7 10*3/uL (ref 3.6–11.0)

## 2018-08-11 LAB — COMPREHENSIVE METABOLIC PANEL
ALBUMIN: 2.8 g/dL — AB (ref 3.5–5.0)
ALK PHOS: 70 U/L (ref 38–126)
ALT: 8 U/L (ref 0–44)
ANION GAP: 5 (ref 5–15)
AST: 14 U/L — ABNORMAL LOW (ref 15–41)
BUN: 24 mg/dL — ABNORMAL HIGH (ref 8–23)
CO2: 30 mmol/L (ref 22–32)
Calcium: 9 mg/dL (ref 8.9–10.3)
Chloride: 108 mmol/L (ref 98–111)
Creatinine, Ser: 0.82 mg/dL (ref 0.44–1.00)
GFR calc Af Amer: >60 mL/min (ref >60)
GFR calc non Af Amer: >60 mL/min (ref >60)
GLUCOSE: 129 mg/dL — AB (ref 70–99)
POTASSIUM: 3.6 mmol/L (ref 3.5–5.1)
Sodium: 143 mmol/L (ref 135–145)
Total Bilirubin: 0.4 mg/dL (ref 0.3–1.2)
Total Protein: 6.8 g/dL (ref 6.5–8.1)

## 2018-08-11 LAB — LACTIC ACID, PLASMA
LACTIC ACID, VENOUS: 1.1 mmol/L (ref 0.5–1.9)
Lactic Acid, Venous: 0.9 mmol/L (ref 0.5–1.9)

## 2018-08-11 LAB — BRAIN NATRIURETIC PEPTIDE: B NATRIURETIC PEPTIDE 5: 96 pg/mL (ref 0.0–100.0)

## 2018-08-11 LAB — AMMONIA: AMMONIA: 51 umol/L — AB (ref 9–35)

## 2018-08-11 LAB — VALPROIC ACID LEVEL: Valproic Acid Lvl: 23 ug/mL — ABNORMAL LOW (ref 50.0–100.0)

## 2018-08-11 MED ORDER — ASPIRIN EC 81 MG PO TBEC
81.0000 mg | DELAYED_RELEASE_TABLET | Freq: Every day | ORAL | Status: DC
  Administered 2018-08-12 – 2018-08-24 (×11): 81 mg via ORAL
  Filled 2018-08-11 (×13): qty 1

## 2018-08-11 MED ORDER — DIVALPROEX SODIUM ER 250 MG PO TB24
250.0000 mg | ORAL_TABLET | Freq: Three times a day (TID) | ORAL | Status: DC
  Administered 2018-08-12 (×3): 250 mg via ORAL
  Filled 2018-08-11 (×10): qty 1

## 2018-08-11 MED ORDER — SODIUM CHLORIDE 0.9 % IV SOLN
2.0000 g | Freq: Three times a day (TID) | INTRAVENOUS | Status: DC
  Administered 2018-08-11 – 2018-08-12 (×2): 2 g via INTRAVENOUS
  Filled 2018-08-11 (×5): qty 2

## 2018-08-11 MED ORDER — VANCOMYCIN HCL IN DEXTROSE 1-5 GM/200ML-% IV SOLN
1000.0000 mg | Freq: Once | INTRAVENOUS | Status: AC
  Administered 2018-08-11: 1000 mg via INTRAVENOUS

## 2018-08-11 MED ORDER — SODIUM CHLORIDE 0.9 % IV BOLUS
1000.0000 mL | Freq: Once | INTRAVENOUS | Status: AC
  Administered 2018-08-11: 1000 mL via INTRAVENOUS

## 2018-08-11 MED ORDER — INSULIN ASPART 100 UNIT/ML ~~LOC~~ SOLN
0.0000 [IU] | Freq: Every day | SUBCUTANEOUS | Status: DC
  Administered 2018-08-15: 23:00:00 3 [IU] via SUBCUTANEOUS
  Filled 2018-08-11: qty 1

## 2018-08-11 MED ORDER — FAMOTIDINE IN NACL 20-0.9 MG/50ML-% IV SOLN
20.0000 mg | INTRAVENOUS | Status: DC
  Administered 2018-08-11 – 2018-08-13 (×3): 20 mg via INTRAVENOUS
  Filled 2018-08-11 (×3): qty 50

## 2018-08-11 MED ORDER — LEVOFLOXACIN IN D5W 750 MG/150ML IV SOLN
750.0000 mg | Freq: Once | INTRAVENOUS | Status: AC
  Administered 2018-08-11: 750 mg via INTRAVENOUS
  Filled 2018-08-11: qty 150

## 2018-08-11 MED ORDER — IRBESARTAN 150 MG PO TABS
75.0000 mg | ORAL_TABLET | Freq: Every day | ORAL | Status: DC
  Administered 2018-08-12 – 2018-08-18 (×4): 75 mg via ORAL
  Filled 2018-08-11 (×6): qty 1

## 2018-08-11 MED ORDER — OLOPATADINE HCL 0.1 % OP SOLN
1.0000 [drp] | Freq: Every day | OPHTHALMIC | Status: DC
  Administered 2018-08-12 – 2018-08-24 (×11): 1 [drp] via OPHTHALMIC
  Filled 2018-08-11 (×3): qty 5

## 2018-08-11 MED ORDER — IPRATROPIUM-ALBUTEROL 0.5-2.5 (3) MG/3ML IN SOLN
3.0000 mL | Freq: Four times a day (QID) | RESPIRATORY_TRACT | Status: DC
  Administered 2018-08-11 – 2018-08-12 (×2): 3 mL via RESPIRATORY_TRACT
  Filled 2018-08-11 (×2): qty 3

## 2018-08-11 MED ORDER — INSULIN ASPART 100 UNIT/ML ~~LOC~~ SOLN
0.0000 [IU] | Freq: Three times a day (TID) | SUBCUTANEOUS | Status: DC
  Administered 2018-08-12: 3 [IU] via SUBCUTANEOUS
  Administered 2018-08-12 – 2018-08-14 (×4): 2 [IU] via SUBCUTANEOUS
  Administered 2018-08-15 – 2018-08-16 (×2): 3 [IU] via SUBCUTANEOUS
  Administered 2018-08-16 – 2018-08-17 (×4): 2 [IU] via SUBCUTANEOUS
  Administered 2018-08-17 – 2018-08-18 (×3): 3 [IU] via SUBCUTANEOUS
  Administered 2018-08-18: 13:00:00 2 [IU] via SUBCUTANEOUS
  Administered 2018-08-19 – 2018-08-20 (×5): 3 [IU] via SUBCUTANEOUS
  Administered 2018-08-21: 2 [IU] via SUBCUTANEOUS
  Administered 2018-08-21 (×2): 3 [IU] via SUBCUTANEOUS
  Administered 2018-08-22 (×2): 2 [IU] via SUBCUTANEOUS
  Administered 2018-08-22: 09:00:00 3 [IU] via SUBCUTANEOUS
  Administered 2018-08-23 – 2018-08-24 (×2): 2 [IU] via SUBCUTANEOUS
  Filled 2018-08-11 (×28): qty 1

## 2018-08-11 MED ORDER — ENOXAPARIN SODIUM 40 MG/0.4ML ~~LOC~~ SOLN
40.0000 mg | SUBCUTANEOUS | Status: DC
  Administered 2018-08-11 – 2018-08-23 (×13): 40 mg via SUBCUTANEOUS
  Filled 2018-08-11 (×13): qty 0.4

## 2018-08-11 MED ORDER — KETOTIFEN FUMARATE 0.025 % OP SOLN
1.0000 [drp] | Freq: Two times a day (BID) | OPHTHALMIC | Status: DC
  Administered 2018-08-11 – 2018-08-24 (×19): 1 [drp] via OPHTHALMIC
  Filled 2018-08-11 (×4): qty 5

## 2018-08-11 MED ORDER — KCL IN DEXTROSE-NACL 20-5-0.45 MEQ/L-%-% IV SOLN
INTRAVENOUS | Status: DC
  Administered 2018-08-11: 22:00:00 via INTRAVENOUS
  Filled 2018-08-11 (×3): qty 1000

## 2018-08-11 MED ORDER — OXYBUTYNIN CHLORIDE 5 MG PO TABS
5.0000 mg | ORAL_TABLET | Freq: Two times a day (BID) | ORAL | Status: DC
  Administered 2018-08-12 – 2018-08-18 (×8): 5 mg via ORAL
  Filled 2018-08-11 (×13): qty 1

## 2018-08-11 MED ORDER — BUDESONIDE 0.5 MG/2ML IN SUSP
0.5000 mg | Freq: Two times a day (BID) | RESPIRATORY_TRACT | Status: DC
  Administered 2018-08-11 – 2018-08-24 (×21): 0.5 mg via RESPIRATORY_TRACT
  Filled 2018-08-11 (×25): qty 2

## 2018-08-11 MED ORDER — POTASSIUM CHLORIDE 2 MEQ/ML IV SOLN: INTRAVENOUS | Status: DC

## 2018-08-11 NOTE — H&P (Signed)
[facility]   PATIENT NAME: [name]    MR#:  [id number]  DATE OF BIRTH:  September 07, 1932  DATE OF ADMISSION:  08/11/2018  PRIMARY CARE PHYSICIAN: Lauro Regulus, MD   REQUESTING/REFERRING PHYSICIAN:   CHIEF COMPLAINT:   Chief Complaint  Patient presents with  . Weakness  . Cough    HISTORY OF PRESENT ILLNESS: [name]  is a 84 y.o. female with a known history per below which includes dementia, presenting from nursing facility with altered mental status, found to be hypoxic on presentation to the emergency room, patient had ?  Flu a week ago, had not been feeling well during this period of time, had cough, in the emergency room patient was unresponsive, patient was found to have acute hypoxic hypercarbic respiratory failure on blood gas-started on BiPAP, CT head noted for extensive sinus disease, hemoglobin 8.1, BUN 24, noted tachypnea, CT of the chest noted for right-sided pneumonia, patient evaluated at the bedside, son was contacted and updated regarding her condition and plan of care going forward with all questions answered, patient is DNR, patient is now been admitted for acute toxic metabolic encephalopathy due to acute pneumonia and polypharmacy.  PAST MEDICAL HISTORY:   Past Medical History:  Diagnosis Date  . Anxiety   . Depression   . Diabetes mellitus without complication (HCC)   . GERD (gastroesophageal reflux disease)   . Hip fracture (HCC)   . Hyperlipemia   . Neuropathy   . Vertigo     PAST SURGICAL HISTORY:  Past Surgical History:  Procedure Laterality Date  . ABDOMINAL HYSTERECTOMY    . APPENDECTOMY    . BACK SURGERY     tumor removal bengin  . CHOLECYSTECTOMY      SOCIAL HISTORY:  Social History   Tobacco Use  . Smoking status: Never Smoker  . Smokeless tobacco: Never Used  Substance Use Topics  . Alcohol use: No    Alcohol/week: 0.0 standard drinks    FAMILY HISTORY:  Family History  Problem  Relation Age of Onset  . Diabetes Mother   . Diabetes Sister        x2  . Breast cancer Sister   . Heart disease Father   . Heart disease Brother        x3  . Bladder Cancer Neg Hx   . Kidney cancer Neg Hx   . Prostate cancer Neg Hx     DRUG ALLERGIES:  Allergies  Allergen Reactions  . Ampicillin     Zpac  . Sulfa Antibiotics     REVIEW OF SYSTEMS: Unable to be obtained given obtunded state  CONSTITUTIONAL: No fever, fatigue or weakness.  EYES: No blurred or double vision.  EARS, NOSE, AND THROAT: No tinnitus or ear pain.  RESPIRATORY: No cough, shortness of breath, wheezing or hemoptysis.  CARDIOVASCULAR: No chest pain, orthopnea, edema.  GASTROINTESTINAL: No nausea, vomiting, diarrhea or abdominal pain.  GENITOURINARY: No dysuria, hematuria.  ENDOCRINE: No polyuria, nocturia,  HEMATOLOGY: No anemia, easy bruising or bleeding SKIN: No rash or lesion. MUSCULOSKELETAL: No joint pain or arthritis.   NEUROLOGIC: No tingling, numbness, weakness.  PSYCHIATRY: No anxiety or depression.   MEDICATIONS AT HOME:  Prior to Admission medications   Medication Sig Start Date End Date Taking? Authorizing Provider  acetaminophen (TYLENOL) 325 MG tablet Take 650 mg by mouth every 4 (four) hours as needed for mild pain or moderate pain.    Yes [provider]  aspirin EC 81 MG tablet Take 81 mg by mouth daily.   Yes [provider]  busPIRone (BUSPAR) 10 MG tablet Take 10 mg by mouth 2 (two) times daily.   Yes [provider]  cetirizine (ZYRTEC) 10 MG tablet Take 10 mg by mouth daily.   Yes [provider]  Cholecalciferol 1000 units tablet Take 2,000 Units by mouth daily.    Yes [provider]  citalopram (CELEXA) 20 MG tablet Take 20 mg by mouth daily.    Yes [provider]  dicyclomine (BENTYL) 20 MG tablet Take 20 mg by mouth 4 (four) times daily.   Yes [provider]  diphenhydrAMINE (BENADRYL) 2 % cream Apply 1  application topically 2 (two) times daily.    Yes [provider]  divalproex (DEPAKOTE ER) 250 MG 24 hr tablet Take 250 mg by mouth 3 (three) times daily.   Yes [provider]  esomeprazole (NEXIUM) 20 MG capsule Take 20 mg by mouth daily at 12 noon.   Yes [provider]  ferrous sulfate 325 (65 FE) MG tablet Take 325 mg by mouth 2 (two) times daily.   Yes [provider]  fluticasone (FLONASE) 50 MCG/ACT nasal spray Place 1 spray into both nostrils daily.    Yes [provider]  gabapentin (NEURONTIN) 300 MG capsule Take 300 mg by mouth 2 (two) times daily.    Yes [provider]  glipiZIDE (GLUCOTROL) 5 MG tablet Take 5 mg by mouth 2 (two) times daily.    Yes [provider]  ketotifen (ZADITOR) 0.025 % ophthalmic solution Place 1 drop into both eyes 2 (two) times daily.    Yes [provider]  Lactobacillus Rhamnosus, GG, (CULTURELLE) CAPS Take 1 capsule by mouth daily.   Yes [provider]  loperamide (IMODIUM) 2 MG capsule Take 2 mg by mouth daily as needed for diarrhea or loose stools.    Yes [provider]  LORazepam (ATIVAN) 0.5 MG tablet Take 0.5 mg by mouth 2 (two) times daily as needed for anxiety.   Yes [provider]  olopatadine (PATANOL) 0.1 % ophthalmic solution Place 1 drop into both eyes daily.   Yes [provider]  ondansetron (ZOFRAN) 4 MG tablet Take 4 mg by mouth every 8 (eight) hours as needed for nausea or vomiting.   Yes [provider]  oxybutynin (DITROPAN) 5 MG tablet TAKE 1 TABLET BY MOUTH TWICE A DAY Patient taking differently: Take 5 mg by mouth 2 (two) times daily.  05/06/16  Yes Defrancesco, Prentice Docker, MD  QUEtiapine (SEROQUEL) 25 MG tablet Take 25 mg by mouth at bedtime.   Yes [provider]  telmisartan (MICARDIS) 40 MG tablet Take 40 mg by mouth daily.    Yes [provider]      PHYSICAL EXAMINATION:   VITAL SIGNS:  Blood pressure (!) 142/51, pulse 86, temperature 99.2 F (37.3 C), temperature source Oral, resp. rate 19, weight 84 kg, SpO2 (!) 86 %.  GENERAL:  84 y.o.-year-old patient lying in the bed with no acute distress.  Frail-appearing EYES: Pupils equal, round, reactive to light and accommodation. No scleral icterus. Extraocular muscles intact.  HEENT: Head atraumatic, normocephalic. Oropharynx and nasopharynx clear.  NECK:  Supple, no jugular venous distention. No thyroid enlargement, no tenderness.  LUNGS: Diminished breath sounds with rhonchi. No use of accessory muscles of respiration.  CARDIOVASCULAR: S1, S2 normal. No murmurs, rubs, or gallops.  ABDOMEN: Soft, nontender, nondistended. Bowel  sounds present. No organomegaly or mass.  EXTREMITIES: No pedal edema, cyanosis, or clubbing.  NEUROLOGIC: PERRL MAES, nonresponsive to voice/sternal rub   PSYCHIATRIC: The patient is obtunded   SKIN: No obvious rash, lesion, or ulcer.   LABORATORY PANEL:   CBC Recent Labs  Lab 08/11/18 1251  WBC 7.0  HGB 8.1*  HCT 25.4*  PLT 200  MCV 83.5  MCH 26.7  MCHC 31.9*  RDW 14.1  LYMPHSABS 0.8*  MONOABS 0.7  EOSABS 0.1  BASOSABS 0.0   ------------------------------------------------------------------------------------------------------------------  Chemistries  Recent Labs  Lab 08/11/18 1251  NA 143  K 3.6  CL 108  CO2 30  GLUCOSE 129*  BUN 24*  CREATININE 0.82  CALCIUM 9.0  AST 14*  ALT 8  ALKPHOS 70  BILITOT 0.4   ------------------------------------------------------------------------------------------------------------------ estimated creatinine clearance is 56.2 mL/min (by C-G formula based on SCr of 0.82 mg/dL). ------------------------------------------------------------------------------------------------------------------ No results for input(s): TSH, T4TOTAL, T3FREE, THYROIDAB in the last 72 hours.  Invalid input(s): FREET3   Coagulation profile No results for  input(s): INR, PROTIME in the last 168 hours. ------------------------------------------------------------------------------------------------------------------- No results for input(s): DDIMER in the last 72 hours. -------------------------------------------------------------------------------------------------------------------  Cardiac Enzymes Recent Labs  Lab 08/11/18 1251  TROPONINI <0.03   ------------------------------------------------------------------------------------------------------------------ Invalid input(s): POCBNP  ---------------------------------------------------------------------------------------------------------------  Urinalysis    Component Value Date/Time   COLORURINE YELLOW (A) 08/11/2018 1552   APPEARANCEUR HAZY (A) 08/11/2018 1552   APPEARANCEUR Clear 05/10/2017 0932   LABSPEC 1.023 08/11/2018 1552   LABSPEC 1.038 06/17/2014 1357   PHURINE 5.0 08/11/2018 1552   GLUCOSEU NEGATIVE 08/11/2018 1552   GLUCOSEU Negative 06/17/2014 1357   HGBUR SMALL (A) 08/11/2018 1552   BILIRUBINUR NEGATIVE 08/11/2018 1552   BILIRUBINUR Negative 05/10/2017 0932   BILIRUBINUR Negative 06/17/2014 1357   KETONESUR 5 (A) 08/11/2018 1552   PROTEINUR 100 (A) 08/11/2018 1552   NITRITE NEGATIVE 08/11/2018 1552   LEUKOCYTESUR TRACE (A) 08/11/2018 1552   LEUKOCYTESUR Negative 05/10/2017 0932   LEUKOCYTESUR Negative 06/17/2014 1357     RADIOLOGY: Ct Head Wo Contrast  Result Date: 08/11/2018 CLINICAL DATA:  Confusion, acute, unexplained. Progressive weakness. EXAM: CT HEAD WITHOUT CONTRAST TECHNIQUE: Contiguous axial images were obtained from the base of the skull through the vertex without intravenous contrast. COMPARISON:  CT head without contrast 05/21/2017 FINDINGS: Brain: Moderate atrophy and white matter changes are stable. A remote lacunar infarct is evident in the medial right cerebellum. No acute infarct, hemorrhage, or mass lesion is present. The basal ganglia are  intact. Insular ribbon is normal. No significant extra-axial fluid collection is present. The ventricles are proportionate to the degree of atrophy. Vascular: Atherosclerotic calcifications present within the cavernous internal carotid arteries bilaterally. There is no asymmetric hyperdense vessel. Skull: Hyperostosis is noted. Calvarium is intact. No focal lytic or blastic lesion is present. Sinuses/Orbits: Extensive ethmoid sinus opacification is noted. There is mucosal thickening and a small fluid level in the left frontal sinus. The left maxillary sinus is near completely opacified. Circumferential mucosal thickening is present in the right maxillary sinus with a small fluid level. Diffuse mucosal thickening is present throughout the sphenoid sinuses. There is fluid in the inferior left mastoid air cells. No obstructing nasopharyngeal lesion is present. The globes and orbits are remarkable only for bilateral lens replacements. IMPRESSION: 1. Stable moderate atrophy and white matter disease. 2. No acute intracranial abnormality or significant interval change. 3. Extensive sinus disease. Electronically Signed   By: Marin Roberts M.D.   On: 08/11/2018 14:50   Ct Chest  Wo Contrast  Result Date: 08/11/2018 CLINICAL DATA:  Shortness of breath, has been feeling bad for 1 week, history diabetes mellitus, hyperlipidemia, GERD EXAM: CT CHEST WITHOUT CONTRAST TECHNIQUE: Multidetector CT imaging of the chest was performed following the standard protocol without IV contrast. Sagittal and coronal MPR images reconstructed from axial data set. COMPARISON:  08/19/2005 FINDINGS: Cardiovascular: Atherosclerotic calcifications of aorta and coronary arteries. Aorta normal caliber. Enlargement of cardiac chambers. Mitral annular calcification. No pericardial effusion. Mediastinum/Nodes: Esophagus unremarkable. Marked enlargement of LEFT thyroid lobe 5.6 x 6.0 cm image 10 not significantly changed. Displacement of trachea  to the RIGHT. Minimally enlarged LEFT paratracheal lymph node 11 mm short axis image 23, increased. Additional scattered normal sized mediastinal nodes. Minimally enlarged LEFT paratracheal node 11 mm short axis image 30. Lungs/Pleura: Peribronchial thickening and scattered atelectasis in the mid to lower lungs increased from previous exam. Minimal peribronchovascular infiltrate RIGHT middle lobe. No additional infiltrate, pleural effusion or pneumothorax. Upper Abdomen: Visualized upper abdomen unremarkable Musculoskeletal: Osseous demineralization with multilevel degenerative changes of the thoracic spine. Paraspinal surgical clips on RIGHT at approximately T7. IMPRESSION: Bronchitic changes with scattered atelectasis in the lower lobes. Mild peribronchovascular infiltrate in RIGHT middle lobe question pneumonia. Extensive atherosclerotic disease calcification. 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 Enlargement of cardiac chambers. Few minimally enlarged nonspecific mediastinal lymph nodes. No other acute intrathoracic abnormalities. Aortic Atherosclerosis (ICD10-I70.0). Electronically Signed   By: Ulyses Southward M.D.   On: 08/11/2018 14:56   Dg Chest Port 1 View  Result Date: 08/11/2018 CLINICAL DATA:  Cough, has felt bad for 1 week EXAM: PORTABLE CHEST 1 VIEW COMPARISON:  Portable exam 1310 hours compared to 05/21/2017 FINDINGS: Enlargement of cardiac silhouette with pulmonary vascular congestion. Atherosclerotic calcification aorta. Hazy interstitial infiltrates which could represent edema or infection. Atelectasis at RIGHT base. No gross pleural effusion or pneumothorax. Bones demineralized. IMPRESSION: Enlargement of cardiac silhouette with pulmonary vascular congestion. Mild BILATERAL interstitial infiltrates question minimal pulmonary edema versus infection. RIGHT basilar atelectasis. Electronically Signed   By: Ulyses Southward M.D.   On: 08/11/2018 13:21     EKG: Orders placed or performed during the hospital encounter of 08/11/18  . EKG 12-Lead  . EKG 12-Lead    IMPRESSION AND PLAN: *Acute toxic metabolic encephalopathy Secondary to multifactorial process which includes acute HCAP and polypharmacy Hold psychotropic meds, check Depakote level, check ammonia level, RPR, neurochecks per routine, aspiration/fall/skin care precautions while in house, IV fluids for rehydration, antibiotics for pneumonia, supplemental oxygen wean as tolerated, continue close medical monitoring  *Acute HCAP Pneumonia protocol, empiric aztreonam/vancomycin for 5-day course given noted allergies, follow-up on cultures  *Chronic diabetes mellitus type 2 Sliding scale insulin with active checks per routine  *Chronic GERD without esophagitis PPI daily  *Chronic dementia Plan of care as stated above   All the records are reviewed and case discussed with ED provider. Management plans discussed with the patient, family and they are in agreement.  CODE STATUS:DNR Code Status History    Date Active Date Inactive Code Status Order ID Comments User Context   05/21/2017 2338 05/24/2017 2208 DNR [id number]  Oralia Manis, MD Inpatient    Questions for Most Recent Historical Code Status (Order [id number])    Question Answer Comment   In the event of cardiac or respiratory ARREST Do not call a "code blue"    In the event of cardiac or respiratory ARREST Do not perform Intubation, CPR, defibrillation or ACLS  In the event of cardiac or respiratory ARREST Use medication by any route, position, wound care, and other measures to relive pain and suffering. May use oxygen, suction and manual treatment of airway obstruction as needed for comfort.         Advance Directive Documentation     Most Recent Value  Type of Advance Directive  Out of facility DNR (pink MOST or yellow form)  Pre-existing out of facility DNR order (yellow form or pink MOST form)  Yellow form  placed in chart (order not valid for inpatient use)  "MOST" Form in Place?  -       TOTAL TIME TAKING CARE OF THIS PATIENT: 40 minutes.    Evelena Asa Betzayda Braxton M.D on 08/11/2018   Between 7am to 6pm - Pager - [phone]  After 6pm go to [email or web address] - Social research officer, government  [facility]  Office  [phone]  CC: Primary care physician; Lauro Regulus, MD   Note: This dictation was prepared with Dragon dictation along with smaller phrase technology. Any transcriptional errors that result from this process are unintentional.

## 2018-08-11 NOTE — ED Provider Notes (Addendum)
[facility] Emergency Department Provider Note   ____________________________________________   First MD Initiated Contact with Patient 08/11/18 1244     (approximate)  I have reviewed the triage vital signs and the nursing notes.   HISTORY  Chief Complaint Weakness and Cough  History limited by altered mental status  HPI [name] is a 84 y.o. female patient diagnosed with flu about a week ago.  Started on any medicine she been getting weaker.  Normally she is awake alert and talkative.  On arrival here she was 88% on room air which is what she usually uses.  She was put on oxygen.  She is now very difficult to arouse.  Cannot get a history.   Past Medical History:  Diagnosis Date  . Anxiety   . Depression   . Diabetes mellitus without complication (HCC)   . GERD (gastroesophageal reflux disease)   . Hip fracture (HCC)   . Hyperlipemia   . Neuropathy   . Vertigo     Patient Active Problem List   Diagnosis Date Noted  . Sepsis (HCC) 05/21/2017  . Aspiration pneumonia (HCC) 05/21/2017  . GERD (gastroesophageal reflux disease) 05/21/2017  . HLD (hyperlipidemia) 05/21/2017  . Depression 05/21/2017  . Anxiety 05/21/2017  . Elevated troponin 05/21/2017  . Diabetes (HCC) 05/21/2017  . Right humeral fracture 05/21/2017  . Nausea and vomiting 03/31/2015  . Microcytic anemia 03/31/2015    Past Surgical History:  Procedure Laterality Date  . ABDOMINAL HYSTERECTOMY    . APPENDECTOMY    . BACK SURGERY     tumor removal bengin  . CHOLECYSTECTOMY      Prior to Admission medications   Medication Sig Start Date End Date Taking? Authorizing Provider  acetaminophen (TYLENOL) 325 MG tablet Take 650 mg by mouth every 4 (four) hours as needed.    [provider]  ALPRAZolam Prudy Feeler) 0.25 MG tablet Take 1 tablet (0.25 mg total) by mouth at bedtime as needed for anxiety. Patient not taking: Reported on 02/14/2018 05/24/17   Auburn Bilberry,  MD  aspirin EC 81 MG tablet Take 81 mg by mouth daily.    [provider]  atorvastatin (LIPITOR) 40 MG tablet atorvastatin 40 mg tablet    [provider]  busPIRone (BUSPAR) 10 MG tablet Take 10 mg by mouth 2 (two) times daily.    [provider]  cetirizine (ZYRTEC) 10 MG tablet Take 10 mg by mouth daily.    [provider]  Cholecalciferol 50000 units TABS Take 2,000 Units by mouth daily. On Monday     [provider]  citalopram (CELEXA) 10 MG tablet Take 10 mg by mouth daily.    [provider]  clotrimazole (LOTRIMIN) 1 % cream Apply 1 application topically 2 (two) times daily.    [provider]  conjugated estrogens (PREMARIN) vaginal cream Place 1 Applicatorful vaginally daily. Apply 0.5mg  (pea-sized amount)  just inside the vaginal introitus with a finger-tip every night for two weeks and then Monday, Wednesday and Friday nights. Patient not taking: Reported on 08/11/2017 05/10/17   Michiel Cowboy A, PA-C  CycloSPORINE (RESTASIS OP) Restasis 0.05 % eye drops in a dropperette    [provider]  diphenhydrAMINE (BENADRYL) 2 % cream Apply topically 3 (three) times daily as needed for itching.    [provider]  diphenhydrAMINE (BENADRYL) 25 mg capsule Take 25 mg by mouth every 6 (six) hours as needed.    [provider]  divalproex (  DEPAKOTE SPRINKLE) 125 MG capsule Take 250 mg by mouth 2 (two) times daily.    [provider]  doxycycline (VIBRA-TABS) 100 MG tablet Take 1 tablet (100 mg total) by mouth 2 (two) times daily. 06/14/18   Merrily Brittle, MD  estradiol (ESTRACE) 0.1 MG/GM vaginal cream estradiol 0.01% (0.1 mg/gram) vaginal cream    [provider]  fluticasone (FLONASE) 50 MCG/ACT nasal spray Place into both nostrils daily.    [provider]  gabapentin (NEURONTIN) 300 MG capsule Take 300 mg by mouth 3 (three) times daily.    [provider]  gentamicin  ointment (GARAMYCIN) 0.1 % gentamicin 0.1 % topical ointment    [provider]  glipiZIDE (GLUCOTROL) 5 MG tablet Take 2.5 mg by mouth 2 (two) times daily before a meal.     [provider]  guaifenesin (ROBITUSSIN) 100 MG/5ML syrup Take 200 mg by mouth 3 (three) times daily as needed for cough.    [provider]  HYDROcodone-acetaminophen (NORCO/VICODIN) 5-325 MG tablet Take 1-2 tablets by mouth every 4 (four) hours as needed for moderate pain. Patient not taking: Reported on 08/11/2017 05/24/17   Auburn Bilberry, MD  ketotifen (ZADITOR) 0.025 % ophthalmic solution Place 1 drop into both eyes 2 (two) times daily.     [provider]  loperamide (IMODIUM) 2 MG capsule Take by mouth as needed for diarrhea or loose stools.    [provider]  losartan-hydrochlorothiazide (HYZAAR) 100-12.5 MG tablet losartan 100 mg-hydrochlorothiazide 12.5 mg tablet    [provider]  meclizine (ANTIVERT) 25 MG tablet Take 25 mg by mouth daily as needed for dizziness.     [provider]  metFORMIN (GLUCOPHAGE) 500 MG tablet metformin 500 mg tablet    [provider]  metoCLOPramide (REGLAN) 5 MG tablet Take 5 mg by mouth 4 (four) times daily.    [provider]  miconazole (ZEASORB-AF) 2 % powder Apply topically as needed for itching.    [provider]  ofloxacin (FLOXIN) 0.3 % OTIC solution ofloxacin 0.3 % ear drops    [provider]  olopatadine (PATANOL) 0.1 % ophthalmic solution Place 1 drop into both eyes daily.    [provider]  omeprazole (PRILOSEC) 20 MG capsule Take 20 mg by mouth daily.     [provider]  ondansetron (ZOFRAN) 4 MG tablet Take 4 mg by mouth every 8 (eight) hours as needed for nausea or vomiting.    [provider]  oxybutynin (DITROPAN) 5 MG tablet TAKE 1 TABLET BY MOUTH TWICE A DAY 05/06/16   Defrancesco, Prentice Docker, MD  Probiotic Product (PROBIOTIC-10 PO) Take by  mouth daily.    [provider]  senna-docusate (SENOKOT-S) 8.6-50 MG tablet Take 2 tablets by mouth 2 (two) times daily.    [provider]  sitaGLIPtin (JANUVIA) 100 MG tablet Januvia 100 mg tablet    [provider]  telmisartan (MICARDIS) 40 MG tablet Take by mouth. 06/09/17 06/09/18  [provider]  Tetrahydrozoline HCl (EYE DROPS OP) neomycin-polymyxin-dexameth 3.5 mg/mL-10,000 unit/mL-0.1% eye drops    [provider]  traMADol (ULTRAM) 50 MG tablet Take 1 tablet (50 mg total) by mouth every 4 (four) hours as needed. 05/24/17   Auburn Bilberry, MD  trimethoprim-polymyxin b (POLYTRIM) ophthalmic solution polymyxin B sulfate 10,000 unit-trimethoprim 1 mg/mL eye drops    [provider]    Allergies Ampicillin and Sulfa antibiotics  Family History  Problem Relation Age of Onset  .  Diabetes Mother   . Diabetes Sister        x2  . Breast cancer Sister   . Heart disease Father   . Heart disease Brother        x3  . Bladder Cancer Neg Hx   . Kidney cancer Neg Hx   . Prostate cancer Neg Hx     Social History Social History   Tobacco Use  . Smoking status: Never Smoker  . Smokeless tobacco: Never Used  Substance Use Topics  . Alcohol use: No    Alcohol/week: 0.0 standard drinks  . Drug use: No    Review of Systems  Unable to obtain ____________________________________________   PHYSICAL EXAM:  VITAL SIGNS: ED Triage Vitals  Enc Vitals Group     BP 08/11/18 1250 (!) 158/66     Pulse Rate 08/11/18 1250 91     Resp 08/11/18 1250 (!) 24     Temp 08/11/18 1250 99.2 F (37.3 C)     Temp Source 08/11/18 1250 Oral     SpO2 08/11/18 1250 90 %     Weight 08/11/18 1254 185 lb 3 oz (84 kg)     Height --      Head Circumference --      Peak Flow --      Pain Score --      Pain Loc --      Pain Edu? --      Excl. in GC? --    Constitutional: Sleepy and very difficult to arouse Eyes: Conjunctivae are normal.  Pupils  are round small Head: Atraumatic. Nose: No congestion/rhinnorhea. Mouth/Throat: Mucous membranes are moist.  Oropharynx non-erythematous. Neck: No stridor.. Cardiovascular: Normal rate, regular rhythm. Grossly normal heart sounds.  Good peripheral circulation. Respiratory: Normal respiratory effort.  No retractions. Lungs creased breath sounds in right base Gastrointestinal: Soft and nontender. No distention. No abdominal bruits. No CVA tenderness. Musculoskeletal: No lower extremity tenderness nor edema. . Neurologic:  Normal speech and language. No gross focal neurologic deficits are appreciated. Skin:  Skin is warm, dry and intact.    ____________________________________________   LABS (all labs ordered are listed, but only abnormal results are displayed)  Labs Reviewed  COMPREHENSIVE METABOLIC PANEL - Abnormal; Notable for the following components:      Result Value   Glucose, Bld 129 (*)    BUN 24 (*)    Albumin 2.8 (*)    AST 14 (*)    All other components within normal limits  CBC WITH DIFFERENTIAL/PLATELET - Abnormal; Notable for the following components:   RBC 3.04 (*)    Hemoglobin 8.1 (*)    HCT 25.4 (*)    MCHC 31.9 (*)    Lymphs Abs 0.8 (*)    All other components within normal limits  BLOOD GAS, ARTERIAL - Abnormal; Notable for the following components:   pH, Arterial 7.34 (*)    pCO2 arterial 55 (*)    pO2, Arterial 76 (*)    Bicarbonate 29.7 (*)    Acid-Base Excess 2.8 (*)    Allens test (pass/fail) POSITIVE (*)    All other components within normal limits  CULTURE, BLOOD (ROUTINE X 2)  CULTURE, BLOOD (ROUTINE X 2)  TROPONIN I  BRAIN NATRIURETIC PEPTIDE  LACTIC ACID, PLASMA  LACTIC ACID, PLASMA  BLOOD GAS, ARTERIAL   ____________________________________________  EKG Interpreted by me shows normal sinus rhythm at 87 right bundle branch block and left anterior hemiblock slight ST segment depression T wave  inversion V1 and V2 compared to EKG from 2  months ago when she had incomplete left bundle branch block and upright T waves in V1  ____________________________________________  RADIOLOGY  ED MD interpretation: EKG read and interpreted by me seems to show a fairly large right-sided pleural effusion and increased vascular markings possibly consistent with congestive failure. CT of the head shows sinus infection and multiple sinuses with air-fluid levels and 2.  CT of the chest shows a pneumonia. Official radiology report(s): Ct Head Wo Contrast  Result Date: 08/11/2018 CLINICAL DATA:  Confusion, acute, unexplained. Progressive weakness. EXAM: CT HEAD WITHOUT CONTRAST TECHNIQUE: Contiguous axial images were obtained from the base of the skull through the vertex without intravenous contrast. COMPARISON:  CT head without contrast 05/21/2017 FINDINGS: Brain: Moderate atrophy and white matter changes are stable. A remote lacunar infarct is evident in the medial right cerebellum. No acute infarct, hemorrhage, or mass lesion is present. The basal ganglia are intact. Insular ribbon is normal. No significant extra-axial fluid collection is present. The ventricles are proportionate to the degree of atrophy. Vascular: Atherosclerotic calcifications present within the cavernous internal carotid arteries bilaterally. There is no asymmetric hyperdense vessel. Skull: Hyperostosis is noted. Calvarium is intact. No focal lytic or blastic lesion is present. Sinuses/Orbits: Extensive ethmoid sinus opacification is noted. There is mucosal thickening and a small fluid level in the left frontal sinus. The left maxillary sinus is near completely opacified. Circumferential mucosal thickening is present in the right maxillary sinus with a small fluid level. Diffuse mucosal thickening is present throughout the sphenoid sinuses. There is fluid in the inferior left mastoid air cells. No obstructing nasopharyngeal lesion is present. The globes and orbits are remarkable only for  bilateral lens replacements. IMPRESSION: 1. Stable moderate atrophy and white matter disease. 2. No acute intracranial abnormality or significant interval change. 3. Extensive sinus disease. Electronically Signed   By: Marin Roberts M.D.   On: 08/11/2018 14:50   Ct Chest Wo Contrast  Result Date: 08/11/2018 CLINICAL DATA:  Shortness of breath, has been feeling bad for 1 week, history diabetes mellitus, hyperlipidemia, GERD EXAM: CT CHEST WITHOUT CONTRAST TECHNIQUE: Multidetector CT imaging of the chest was performed following the standard protocol without IV contrast. Sagittal and coronal MPR images reconstructed from axial data set. COMPARISON:  08/19/2005 FINDINGS: Cardiovascular: Atherosclerotic calcifications of aorta and coronary arteries. Aorta normal caliber. Enlargement of cardiac chambers. Mitral annular calcification. No pericardial effusion. Mediastinum/Nodes: Esophagus unremarkable. Marked enlargement of LEFT thyroid lobe 5.6 x 6.0 cm image 10 not significantly changed. Displacement of trachea to the RIGHT. Minimally enlarged LEFT paratracheal lymph node 11 mm short axis image 23, increased. Additional scattered normal sized mediastinal nodes. Minimally enlarged LEFT paratracheal node 11 mm short axis image 30. Lungs/Pleura: Peribronchial thickening and scattered atelectasis in the mid to lower lungs increased from previous exam. Minimal peribronchovascular infiltrate RIGHT middle lobe. No additional infiltrate, pleural effusion or pneumothorax. Upper Abdomen: Visualized upper abdomen unremarkable Musculoskeletal: Osseous demineralization with multilevel degenerative changes of the thoracic spine. Paraspinal surgical clips on RIGHT at approximately T7. IMPRESSION: Bronchitic changes with scattered atelectasis in the lower lobes. Mild peribronchovascular infiltrate in RIGHT middle lobe question pneumonia. Extensive atherosclerotic disease calcification. 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 Enlargement of cardiac chambers. Few minimally enlarged nonspecific mediastinal lymph nodes. No other acute intrathoracic abnormalities. Aortic Atherosclerosis (ICD10-I70.0). Electronically Signed   By: Ulyses Southward M.D.   On: 08/11/2018 14:56   Dg  Chest Port 1 View  Result Date: 08/11/2018 CLINICAL DATA:  Cough, has felt bad for 1 week EXAM: PORTABLE CHEST 1 VIEW COMPARISON:  Portable exam 1310 hours compared to 05/21/2017 FINDINGS: Enlargement of cardiac silhouette with pulmonary vascular congestion. Atherosclerotic calcification aorta. Hazy interstitial infiltrates which could represent edema or infection. Atelectasis at RIGHT base. No gross pleural effusion or pneumothorax. Bones demineralized. IMPRESSION: Enlargement of cardiac silhouette with pulmonary vascular congestion. Mild BILATERAL interstitial infiltrates question minimal pulmonary edema versus infection. RIGHT basilar atelectasis. Electronically Signed   By: Ulyses Southward M.D.   On: 08/11/2018 13:21    ____________________________________________   PROCEDURES  Procedure(s) performed:   Procedures  Critical Care performed: Critical care time half an hour reviewing patient's studies review of examining the patient ordering various tests and speaking to the hospital doctor.  ____________________________________________   INITIAL IMPRESSION / ASSESSMENT AND PLAN / ED COURSE  Patient, who is normally awake and alert and talkative is now not acting this way she is quite altered.  I was thinking that perhaps this could be due to her acidosis from CO2 retention put her on BiPAP but she is still altered.  Head CT does not show a good etiology for this although there is a sinus infection there is a cyst pneumonia on chest x-ray as well.  Patient could possibly be septic and I will call her sepsis especially as her last blood pressure is now lower.  We will give her some fluids and  antibiotics and get her in the hospital.         ____________________________________________   FINAL CLINICAL IMPRESSION(S) / ED DIAGNOSES  Final diagnoses:  Cough  Generalized weakness  Altered mental status, unspecified altered mental status type  Sepsis due to other etiology [facility])     ED Discharge Orders    None       Note:  This document was prepared using Dragon voice recognition software and may include unintentional dictation errors.    Arnaldo Natal, MD 08/11/18 1517    Arnaldo Natal, MD 08/22/18 1041

## 2018-08-11 NOTE — Progress Notes (Signed)
Family Meeting Note  Advance Directive:yes  Today a meeting took place with the patient, facility documentation, and son   Patient is unable to participate due to obtundation  The following clinical team members were present during this meeting:MD  The following were discussed:Patient's diagnosis: Dementia, pneumonia, GERD, diabetes, obtundation, acute respiratory failure, Patient's progosis: Unable to determine and Goals for treatment: DNR  Additional follow-up to be provided: prn  Time spent during discussion:20 minutes  Bertrum Sol, MD

## 2018-08-11 NOTE — Progress Notes (Signed)
Patient refused BiPap. Says she does not use one at home and does not want to wear one tonight. Will continue to monitor.

## 2018-08-11 NOTE — ED Triage Notes (Signed)
Patient from [facility] via [facility]. Per EMS, patient has been feeling bad for approximately 1 week. Was seen by PCP last week and told she had the flu but was not started on any medication. Patient getting progressively weaker for last few days. Unable to get out of bed without assistance. Congested cough noted upon arrival. Patient denies pain in specific are, complaining of general malaise.

## 2018-08-11 NOTE — ED Notes (Signed)
Rachel RN, aware of bed assigned  

## 2018-08-12 LAB — BASIC METABOLIC PANEL
ANION GAP: 6 (ref 5–15)
BUN: 19 mg/dL (ref 8–23)
CHLORIDE: 110 mmol/L (ref 98–111)
CO2: 27 mmol/L (ref 22–32)
Calcium: 8.5 mg/dL — ABNORMAL LOW (ref 8.9–10.3)
Creatinine, Ser: 0.58 mg/dL (ref 0.44–1.00)
GFR calc non Af Amer: >60 mL/min (ref >60)
Glucose, Bld: 155 mg/dL — ABNORMAL HIGH (ref 70–99)
Potassium: 3.7 mmol/L (ref 3.5–5.1)
SODIUM: 143 mmol/L (ref 135–145)

## 2018-08-12 LAB — MRSA PCR SCREENING: MRSA by PCR: NEGATIVE

## 2018-08-12 LAB — GLUCOSE, CAPILLARY
GLUCOSE-CAPILLARY: 120 mg/dL — AB (ref 70–99)
GLUCOSE-CAPILLARY: 126 mg/dL — AB (ref 70–99)
GLUCOSE-CAPILLARY: 168 mg/dL — AB (ref 70–99)
Glucose-Capillary: 170 mg/dL — ABNORMAL HIGH (ref 70–99)

## 2018-08-12 LAB — TROPONIN I: TROPONIN I: 0.03 ng/mL — AB (ref <0.03)

## 2018-08-12 LAB — STREP PNEUMONIAE URINARY ANTIGEN: Strep Pneumo Urinary Antigen: NEGATIVE

## 2018-08-12 MED ORDER — VANCOMYCIN HCL IN DEXTROSE 1-5 GM/200ML-% IV SOLN
1000.0000 mg | INTRAVENOUS | Status: DC
  Administered 2018-08-12: 08:00:00 1000 mg via INTRAVENOUS
  Filled 2018-08-12 (×2): qty 200

## 2018-08-12 MED ORDER — SODIUM CHLORIDE 0.9 % IV SOLN
2.0000 g | Freq: Two times a day (BID) | INTRAVENOUS | Status: DC
  Administered 2018-08-12 – 2018-08-17 (×11): 2 g via INTRAVENOUS
  Filled 2018-08-12 (×12): qty 2

## 2018-08-12 MED ORDER — IPRATROPIUM-ALBUTEROL 0.5-2.5 (3) MG/3ML IN SOLN
3.0000 mL | Freq: Three times a day (TID) | RESPIRATORY_TRACT | Status: DC
  Administered 2018-08-12 – 2018-08-18 (×14): 3 mL via RESPIRATORY_TRACT
  Filled 2018-08-12 (×16): qty 3

## 2018-08-12 MED ORDER — POTASSIUM CHLORIDE IN NACL 20-0.9 MEQ/L-% IV SOLN
INTRAVENOUS | Status: DC
  Administered 2018-08-12 – 2018-08-14 (×5): via INTRAVENOUS
  Filled 2018-08-12 (×6): qty 1000

## 2018-08-12 MED ORDER — ORAL CARE MOUTH RINSE
15.0000 mL | Freq: Two times a day (BID) | OROMUCOSAL | Status: DC
  Administered 2018-08-12 – 2018-08-24 (×24): 15 mL via OROMUCOSAL

## 2018-08-12 MED ORDER — PNEUMOCOCCAL VAC POLYVALENT 25 MCG/0.5ML IJ INJ
0.5000 mL | INJECTION | INTRAMUSCULAR | Status: AC
  Administered 2018-08-14: 0.5 mL via INTRAMUSCULAR
  Filled 2018-08-12: qty 0.5

## 2018-08-12 MED ORDER — INFLUENZA VAC SPLIT HIGH-DOSE 0.5 ML IM SUSY
0.5000 mL | PREFILLED_SYRINGE | INTRAMUSCULAR | Status: AC
  Administered 2018-08-14: 0.5 mL via INTRAMUSCULAR
  Filled 2018-08-12 (×2): qty 0.5

## 2018-08-12 NOTE — NC FL2 (Addendum)
[place] MEDICAID FL2 LEVEL OF CARE SCREENING TOOL     IDENTIFICATION  Patient Name: [name] Birthdate: 09/11/1932 Sex: female Admission Date (Current Location): 08/11/2018  [place] and [place] Number:  Chiropodist and Address:  [facility], [address] [place], [place] [address]      Provider Number: [id number]  Attending Physician Name and Address:  Houston Siren, MD  Relative Name and Phone Number:  Nadyne Gariepy [facility]) [phone]    Current Level of Care: Hospital Recommended Level of Care: SNF Prior Approval Number:    Date Approved/Denied:   PASRR Number:  Discharge Plan: SNF   Current Diagnoses: Patient Active Problem List   Diagnosis Date Noted  . Encephalopathy acute 08/11/2018  . Sepsis (HCC) 05/21/2017  . Aspiration pneumonia (HCC) 05/21/2017  . GERD (gastroesophageal reflux disease) 05/21/2017  . HLD (hyperlipidemia) 05/21/2017  . Depression 05/21/2017  . Anxiety 05/21/2017  . Elevated troponin 05/21/2017  . Diabetes (HCC) 05/21/2017  . Right humeral fracture 05/21/2017  . Nausea and vomiting 03/31/2015  . Microcytic anemia 03/31/2015    Orientation RESPIRATION BLADDER Height & Weight     Self, Place  O2(Acute o2 ) Continent Weight: 185 lb 3 oz (84 kg) Height:     BEHAVIORAL SYMPTOMS/MOOD NEUROLOGICAL BOWEL NUTRITION STATUS      Continent Diet(Dysphagia 1, extra gravy on meats)  AMBULATORY STATUS COMMUNICATION OF NEEDS Skin   Extensive Assist  Verbally Normal                       Personal Care Assistance Level of Assistance  Bathing, Feeding, Dressing Bathing Assistance: Limited assistance Feeding assistance: Independent Dressing Assistance: Limited assistance     Functional Limitations Info  Sight, Hearing, Speech Sight Info: Adequate Hearing Info: Adequate Speech Info: Adequate    SPECIAL CARE FACTORS FREQUENCY                       Contractures Contractures Info: Not  present    Additional Factors Info  Code Status, Allergies, Psychotropic Code Status Info: DNR Allergies Info: Ampicillin, Sulfa Antibiotics Psychotropic Info: Seroquel, Depakote         Current Medications (08/12/2018):  This is the current hospital active medication list Current Facility-Administered Medications  Medication Dose Route Frequency Provider Last Rate Last Dose  . 0.9 % NaCl with KCl 20 mEq/ L  infusion   Intravenous Continuous Houston Siren, MD 75 mL/hr at 08/12/18 1145    . aspirin EC tablet 81 mg  81 mg Oral Daily Salary, Montell D, MD   81 mg at 08/12/18 0848  . budesonide (PULMICORT) nebulizer solution 0.5 mg  0.5 mg Nebulization BID Salary, Montell D, MD   0.5 mg at 08/12/18 0746  . ceFEPIme (MAXIPIME) 2 g in sodium chloride 0.9 % 100 mL IVPB  2 g Intravenous Q12H Hallaji, Sheema M, RPH 200 mL/hr at 08/12/18 1148 2 g at 08/12/18 1148  . divalproex (DEPAKOTE ER) 24 hr tablet 250 mg  250 mg Oral TID Angelina Ok D, MD   250 mg at 08/12/18 0849  . enoxaparin (LOVENOX) injection 40 mg  40 mg Subcutaneous Q24H Salary, Montell D, MD   40 mg at 08/11/18 2318  . famotidine (PEPCID) IVPB 20 mg premix  20 mg Intravenous Q24H Bertrum Sol, MD   Stopped at 08/11/18 2347  . [START ON 08/13/2018] Influenza vac split quadrivalent PF (FLUZONE HIGH-DOSE) injection 0.5  mL  0.5 mL Intramuscular Tomorrow-1000 Sainani, Vivek J, MD      . insulin aspart (novoLOG) injection 0-15 Units  0-15 Units Subcutaneous TID WC Bertrum Sol, MD   3 Units at 08/12/18 0848  . insulin aspart (novoLOG) injection 0-5 Units  0-5 Units Subcutaneous QHS Salary, Montell D, MD      . ipratropium-albuterol (DUONEB) 0.5-2.5 (3) MG/3ML nebulizer solution 3 mL  3 mL Nebulization TID Houston Siren, MD   3 mL at 08/12/18 1403  . irbesartan (AVAPRO) tablet 75 mg  75 mg Oral Daily Salary, Montell D, MD   75 mg at 08/12/18 0849  . ketotifen (ZADITOR) 0.025 % ophthalmic solution 1 drop  1 drop Both Eyes BID  Salary, Jetty Duhamel D, MD   1 drop at 08/12/18 0850  . MEDLINE mouth rinse  15 mL Mouth Rinse BID Houston Siren, MD   15 mL at 08/12/18 0848  . olopatadine (PATANOL) 0.1 % ophthalmic solution 1 drop  1 drop Both Eyes Daily Salary, Montell D, MD   1 drop at 08/12/18 0850  . oxybutynin (DITROPAN) tablet 5 mg  5 mg Oral BID Salary, Montell D, MD   5 mg at 08/12/18 0848  . [START ON 08/13/2018] pneumococcal 23 valent vaccine (PNU-IMMUNE) injection 0.5 mL  0.5 mL Intramuscular Tomorrow-1000 Sainani, Rolly Pancake, MD         Discharge Medications: Please see discharge summary for a list of discharge medications.  Relevant Imaging Results:  Relevant Lab Results:   Additional Information SS#[id number]  Judi Cong, LCSW

## 2018-08-12 NOTE — Clinical Social Work Note (Signed)
Clinical Social Work Assessment  Patient Details  Name: [name] MRN: [id number] Date of Birth: [birth year]  Date of referral:  08/12/18               Reason for consult:  Facility Placement                Permission sought to share information with:  Facility Art therapist granted to share information::  Yes, Verbal Permission Granted  Name::        Agency::  [facility]  Relationship::     Contact Information:     Housing/Transportation Living arrangements for the past 2 months:  [place] of Information:  Patient, Medical Team, Facility Patient Interpreter Needed:  None Criminal Activity/Legal Involvement Pertinent to Current Situation/Hospitalization:  No - Comment as needed Significant Relationships:  Adult Children, Community Support Lives with:  Facility Resident Do you feel safe going back to the place where you live?  Yes Need for family participation in patient care:  No (Coment)  Care giving concerns: Patient admitted from [place]   Social Worker assessment / plan:  The CSW met with the patient at bedside to discuss discharge planning. The patient shared that she is from [facility] and would like to return when stable. The patient did seem to think she has lived at [facility] for over a year; according to the chart, the patient has lived there for about 3-4 months. Prior to placement at [facility], the patient resided at [place].  The CSW contacted Mickel Baas, the weekend supervisor for [facility], who reported that the patient can return when stable. The CSW updated Mickel Baas on the patient's condition. The CSW will continue to follow for discharge facilitation.  Employment status:  Retired Nurse, adult, Medicaid In [place] PT Recommendations:  Not assessed at this time Information / Referral to community resources:     Patient/Family's Response to care: The patient and facility  thanked the CSW.  Patient/Family's Understanding of and Emotional Response to Diagnosis, Current Treatment, and Prognosis:  The patient has some residual confusion; however, her AMS is improving. The patient would like to return to [place] when stable. The facility is in agreement.  Emotional Assessment Appearance:  Appears stated age Attitude/Demeanor/Rapport:  Lethargic, Gracious Affect (typically observed):  Pleasant Orientation:  Oriented to Self, Oriented to Place, Oriented to Situation Alcohol / Substance use:  Never Used Psych involvement (Current and /or in the community):  No (Comment)  Discharge Needs  Concerns to be addressed:  Care Coordination, Discharge Planning Concerns Readmission within the last 30 days:  No Current discharge risk:  Chronically ill Barriers to Discharge:  Continued Medical Work up   [employer], LCSW 08/12/2018, 2:32 PM

## 2018-08-12 NOTE — Progress Notes (Signed)
[facility] - [place] [facility]   PATIENT NAME: [name]    MR#:  [id number]  DATE OF BIRTH:  Mar 04, 1932  SUBJECTIVE:   Patient here due to weakness, cough also confusion and noted to have pneumonia.  Patient presently is more awake and alert and denies any shortness of breath. Pt. Remains hypoxic and on a venti-mask.   REVIEW OF SYSTEMS:    Review of Systems  Constitutional: Negative for chills and fever.  HENT: Negative for congestion and tinnitus.   Eyes: Negative for blurred vision and double vision.  Respiratory: Negative for cough, shortness of breath and wheezing.   Cardiovascular: Negative for chest pain, orthopnea and PND.  Gastrointestinal: Negative for abdominal pain, diarrhea, nausea and vomiting.  Genitourinary: Negative for dysuria and hematuria.  Neurological: Negative for dizziness, sensory change and focal weakness.  All other systems reviewed and are negative.   Nutrition: Dysphagia 1.  Tolerating Diet: Yes Tolerating PT: Await Eval.   DRUG ALLERGIES:   Allergies  Allergen Reactions  . Ampicillin     Zpac  . Sulfa Antibiotics     VITALS:  Blood pressure (!) 174/71, pulse 80, temperature 98 F (36.7 C), temperature source Oral, resp. rate 19, weight 84 kg, SpO2 92 %.  PHYSICAL EXAMINATION:   Physical Exam  GENERAL:  84 y.o.-year-old patient lying in bed lethargic and on a venti-mask.    EYES: Pupils equal, round, reactive to light and accommodation. No scleral icterus. Extraocular muscles intact.  HEENT: Head atraumatic, normocephalic. Oropharynx and nasopharynx clear.  Dry Oral Mucosa. NECK:  Supple, no jugular venous distention. No thyroid enlargement, no tenderness.  LUNGS: Poor Resp. effort, no wheezing, rales, rhonchi. No use of accessory muscles of respiration.  CARDIOVASCULAR: S1, S2 normal. No murmurs, rubs, or gallops.  ABDOMEN: Soft, nontender, nondistended. Bowel sounds present. No organomegaly or mass.   EXTREMITIES: No cyanosis, clubbing or edema b/l.    NEUROLOGIC: Cranial nerves II through XII are intact. No focal Motor or sensory deficits b/l.  Globally weak.  PSYCHIATRIC: The patient is alert and oriented x 3.  SKIN: No obvious rash, lesion, or ulcer.    LABORATORY PANEL:   CBC Recent Labs  Lab 08/11/18 2230  WBC 9.3  HGB 10.5*  HCT 34.0*  PLT 257   ------------------------------------------------------------------------------------------------------------------  Chemistries  Recent Labs  Lab 08/11/18 1251  08/12/18 0441  NA 143  --  143  K 3.6  --  3.7  CL 108  --  110  CO2 30  --  27  GLUCOSE 129*  --  155*  BUN 24*  --  19  CREATININE 0.82   < > 0.58  CALCIUM 9.0  --  8.5*  AST 14*  --   --   ALT 8  --   --   ALKPHOS 70  --   --   BILITOT 0.4  --   --    < > = values in this interval not displayed.   ------------------------------------------------------------------------------------------------------------------  Cardiac Enzymes Recent Labs  Lab 08/12/18 0441  TROPONINI 0.03*   ------------------------------------------------------------------------------------------------------------------  RADIOLOGY:  Ct Head Wo Contrast  Result Date: 08/11/2018 CLINICAL DATA:  Confusion, acute, unexplained. Progressive weakness. EXAM: CT HEAD WITHOUT CONTRAST TECHNIQUE: Contiguous axial images were obtained from the base of the skull through the vertex without intravenous contrast. COMPARISON:  CT head without contrast 05/21/2017 FINDINGS: Brain: Moderate atrophy and white matter changes are stable. A remote lacunar infarct is evident in the medial right  cerebellum. No acute infarct, hemorrhage, or mass lesion is present. The basal ganglia are intact. Insular ribbon is normal. No significant extra-axial fluid collection is present. The ventricles are proportionate to the degree of atrophy. Vascular: Atherosclerotic calcifications present within the cavernous internal  carotid arteries bilaterally. There is no asymmetric hyperdense vessel. Skull: Hyperostosis is noted. Calvarium is intact. No focal lytic or blastic lesion is present. Sinuses/Orbits: Extensive ethmoid sinus opacification is noted. There is mucosal thickening and a small fluid level in the left frontal sinus. The left maxillary sinus is near completely opacified. Circumferential mucosal thickening is present in the right maxillary sinus with a small fluid level. Diffuse mucosal thickening is present throughout the sphenoid sinuses. There is fluid in the inferior left mastoid air cells. No obstructing nasopharyngeal lesion is present. The globes and orbits are remarkable only for bilateral lens replacements. IMPRESSION: 1. Stable moderate atrophy and white matter disease. 2. No acute intracranial abnormality or significant interval change. 3. Extensive sinus disease. Electronically Signed   By: Marin Roberts M.D.   On: 08/11/2018 14:50   Ct Chest Wo Contrast  Result Date: 08/11/2018 CLINICAL DATA:  Shortness of breath, has been feeling bad for 1 week, history diabetes mellitus, hyperlipidemia, GERD EXAM: CT CHEST WITHOUT CONTRAST TECHNIQUE: Multidetector CT imaging of the chest was performed following the standard protocol without IV contrast. Sagittal and coronal MPR images reconstructed from axial data set. COMPARISON:  08/19/2005 FINDINGS: Cardiovascular: Atherosclerotic calcifications of aorta and coronary arteries. Aorta normal caliber. Enlargement of cardiac chambers. Mitral annular calcification. No pericardial effusion. Mediastinum/Nodes: Esophagus unremarkable. Marked enlargement of LEFT thyroid lobe 5.6 x 6.0 cm image 10 not significantly changed. Displacement of trachea to the RIGHT. Minimally enlarged LEFT paratracheal lymph node 11 mm short axis image 23, increased. Additional scattered normal sized mediastinal nodes. Minimally enlarged LEFT paratracheal node 11 mm short axis image 30.  Lungs/Pleura: Peribronchial thickening and scattered atelectasis in the mid to lower lungs increased from previous exam. Minimal peribronchovascular infiltrate RIGHT middle lobe. No additional infiltrate, pleural effusion or pneumothorax. Upper Abdomen: Visualized upper abdomen unremarkable Musculoskeletal: Osseous demineralization with multilevel degenerative changes of the thoracic spine. Paraspinal surgical clips on RIGHT at approximately T7. IMPRESSION: Bronchitic changes with scattered atelectasis in the lower lobes. Mild peribronchovascular infiltrate in RIGHT middle lobe question pneumonia. Extensive atherosclerotic disease calcification. 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 Enlargement of cardiac chambers. Few minimally enlarged nonspecific mediastinal lymph nodes. No other acute intrathoracic abnormalities. Aortic Atherosclerosis (ICD10-I70.0). Electronically Signed   By: Ulyses Southward M.D.   On: 08/11/2018 14:56   Dg Chest Port 1 View  Result Date: 08/11/2018 CLINICAL DATA:  Cough, has felt bad for 1 week EXAM: PORTABLE CHEST 1 VIEW COMPARISON:  Portable exam 1310 hours compared to 05/21/2017 FINDINGS: Enlargement of cardiac silhouette with pulmonary vascular congestion. Atherosclerotic calcification aorta. Hazy interstitial infiltrates which could represent edema or infection. Atelectasis at RIGHT base. No gross pleural effusion or pneumothorax. Bones demineralized. IMPRESSION: Enlargement of cardiac silhouette with pulmonary vascular congestion. Mild BILATERAL interstitial infiltrates question minimal pulmonary edema versus infection. RIGHT basilar atelectasis. Electronically Signed   By: Ulyses Southward M.D.   On: 08/11/2018 13:21     ASSESSMENT AND PLAN:   84 year old female with past medical history of diabetes, hypertension, GERD, hyperlipidemia, anxiety/depression, neuropathy who presented to the hospital due to weakness, cough and altered  mental status and noted to have pneumonia.  1.  Altered mental status- metabolic encephalopathy  secondary to mild hypercapnia and underlying pneumonia. - Patient has been weaned off BiPAP, pneumonia being treated with IV antibiotics and mental status is improved.  2.  Pneumonia-this is a source of patient's weakness and shortness of breath. - Continue IV cefepime, MRSA PCR was negative therefore taken off vancomycin. -Follow cultures, clinically afebrile and hemodynamically stable.  3.  COPD-mild acute exacerbation secondary to pneumonia. -Continue scheduled duo nebs, Pulmicort nebs.  4.  History of urinary incontinence-continue oxybutynin.  5.  Until hypertension-continue Avapro  6. Anxiety/depresion - cont. Depakote.      All the records are reviewed and case discussed with Care Management/Social Worker. Management plans discussed with the patient, family and they are in agreement.  CODE STATUS: DNR  DVT Prophylaxis: Lovenox  TOTAL TIME TAKING CARE OF THIS PATIENT: 30 minutes.   POSSIBLE D/C IN 1-2 DAYS, DEPENDING ON CLINICAL CONDITION.   Houston Siren M.D on 08/12/2018 at 1:34 PM  Between 7am to 6pm - Pager - [phone]  After 6pm go to [email or web address] - Scientist, research (life sciences) [facility]  Office  [phone]  CC: Primary care physician; Lauro Regulus, MD

## 2018-08-12 NOTE — Evaluation (Signed)
Clinical/Bedside Swallow Evaluation Patient Details  Name: [name] MRN: [id number] Date of Birth: [birth year]  Today's Date: 08/12/2018 Time: SLP Start Time (ACUTE ONLY): 1110 SLP Stop Time (ACUTE ONLY): 1200 SLP Time Calculation (min) (ACUTE ONLY): 50 min  Past Medical History:  Past Medical History:  Diagnosis Date  . Anxiety   . Depression   . Diabetes mellitus without complication (HCC)   . GERD (gastroesophageal reflux disease)   . Hip fracture (HCC)   . Hyperlipemia   . Neuropathy   . Vertigo    Past Surgical History:  Past Surgical History:  Procedure Laterality Date  . ABDOMINAL HYSTERECTOMY    . APPENDECTOMY    . BACK SURGERY     tumor removal bengin  . CHOLECYSTECTOMY     HPI:  Pt is a 84 y.o. female with a known history which includes dementia, GERD, vertigo, hip fx w/ repair, anxiety/depression, DM, neuropathy presenting from nursing facility with altered mental status, found to be hypoxic on presentation to the emergency room, patient had ? Flu a week ago, had not been feeling well during this period of time, had cough, in the emergency room patient was unresponsive, patient was found to have acute hypoxic hypercarbic respiratory failure on blood gas-started on BiPAP, CT head noted for extensive sinus disease. CT of the chest noted for Bronchitic changes with scattered Atelectasis in the lower lobes, right-sided pneumonia, marked enlargement of the Left thyroid lobe. Patient admitted for acute toxic metabolic encephalopathy due to acute pneumonia and polypharmacy.  Today, pt is alert, talkative w/ declined Cognition noted during verbal engagement. Pt follows instruction given Mod verbal/tactile cues; easily distracted also.  Noted Edentulous at baseline.    Assessment / Plan / Recommendation Clinical Impression  Pt appeared to present w/ adequate oropharyngeal phase swallowing function w/ trial consistencies assessed (thin liquids, puree) w/ reduced risk for  aspiration when following general aspiration precautions; min assistance during oral intake as pt does have Cognitive decline d/t Dementia at baseline. Dementia can increase risk for dysphagia, aspiration. During trials of ice chips, thin liquids, and purees, no overt s/s of aspiration were noted; no decline in respiratory status, no decline in vocal quality. During the oral phase, pt demonsrated adequate bolus management and oral clearing of bolus trials. No trials of increased solids were given d/t Edentulous status and Cognitive status. OM exam revealed no unilateral weakness. Pt fed self w/ setup assist and cues. Recommend initiation of a Dysphagia level 1 (PUREE) w/ Thin liquids diet; general aspiration precautions; Pills in Puree - as needed for safer, easier swallowing. Tray setup at meals; monitoring. ST services will f/u w/ toleration of diet and trials to upgrade if appropriate. NSG/MD updated. SLP Visit Diagnosis: Dysphagia, oral phase (R13.11)(w/ Cognitive impact from Dementia baseline)    Aspiration Risk  Mild aspiration risk(but reduced following general aspiration precautions)    Diet Recommendation  Dysphagia level 1 (PUREE) w/ Thin liquids; aspiration precautions; REFLUX precautions. Monitoring at meals d/t Cognitive decline/Dementia  Medication Administration: Whole meds with puree(if needed for easier swallowing)    Other  Recommendations Recommended Consults: (Dietician f/u) Oral Care Recommendations: Oral care BID;Staff/trained caregiver to provide oral care Other Recommendations: (n/a)   Follow up Recommendations None(TBD)      Frequency and Duration min 2x/week  1 week       Prognosis Prognosis for Safe Diet Advancement: Fair(-Good w/ modified diet) Barriers to Reach Goals: Cognitive deficits;Time post onset;Severity of deficits(GERD, edentulous at baseline)  Swallow Study   General Date of Onset: 08/11/18 HPI: Pt is a 84 y.o. female with a known history which  includes dementia, GERD, vertigo, hip fx w/ repair, anxiety/depression, DM, neuropathy presenting from nursing facility with altered mental status, found to be hypoxic on presentation to the emergency room, patient had ? Flu a week ago, had not been feeling well during this period of time, had cough, in the emergency room patient was unresponsive, patient was found to have acute hypoxic hypercarbic respiratory failure on blood gas-started on BiPAP, CT head noted for extensive sinus disease. CT of the chest noted for Bronchitic changes with scattered Atelectasis in the lower lobes, right-sided pneumonia, marked enlargement of the Left thyroid lobe. Patient admitted for acute toxic metabolic encephalopathy due to acute pneumonia and polypharmacy.  Today, pt is alert, talkative w/ declined Cognition noted during verbal engagement. Pt follows instruction given Mod verbal/tactile cues; easily distracted also.  Noted Edentulous at baseline.  Type of Study: Bedside Swallow Evaluation Previous Swallow Assessment: none reported Diet Prior to this Study: NPO Temperature Spikes Noted: No(wbc 9.3) Respiratory Status: Nasal cannula(4 liters) History of Recent Intubation: No Behavior/Cognition: Alert;Cooperative;Pleasant mood;Confused;Distractible;Requires cueing(Speech slighty decreased in intelligibility - edentulous) Oral Cavity Assessment: Within Functional Limits Oral Care Completed by SLP: Recent completion by staff Oral Cavity - Dentition: Edentulous Vision: Functional for self-feeding Self-Feeding Abilities: Able to feed self;Needs assist;Needs set up Patient Positioning: Upright in bed(needed positioning) Baseline Vocal Quality: Normal Volitional Cough: Strong(w/ cues) Volitional Swallow: Able to elicit(w/ cues)    Oral/Motor/Sensory Function Overall Oral Motor/Sensory Function: Within functional limits   Ice Chips Ice chips: Within functional limits Presentation: Spoon(fed; 2 trials)   Thin Liquid  Thin Liquid: Within functional limits Presentation: Cup;Self Fed;Straw(~4 ozs total) Other Comments: NSG reported pt had drunk thin liquids w/ pills w/ no overt s/s of aspiration observed    Nectar Thick Nectar Thick Liquid: Not tested   Honey Thick Honey Thick Liquid: Not tested   Puree Puree: Within functional limits Presentation: Self Fed;Spoon(~3 ozs)   Solid     Solid: Not tested Other Comments: edentulous status; Cognitive decline      Jerilynn Som, MS, CCC-SLP [facility] 08/12/2018,12:00 PM

## 2018-08-12 NOTE — Consult Note (Signed)
Pharmacy Antibiotic Note  [name] is a 84 y.o. female admitted on 08/11/2018 with pneumonia.  Pharmacy has been consulted for Cefepime dosing.  Plan: Start cefepime 2g IV every 12 hours.   Weight: 185 lb 3 oz (84 kg)  Temp (24hrs), Avg:98.3 F (36.8 C), Min:97.7 F (36.5 C), Max:99.2 F (37.3 C)  Recent Labs  Lab 08/11/18 1251 08/11/18 1551 08/11/18 1924 08/11/18 2230  WBC 7.0  --   --  9.3  CREATININE 0.82  --   --  0.63  LATICACIDVEN  --  0.9 1.1  --     Estimated Creatinine Clearance: 57.6 mL/min (by C-G formula based on SCr of 0.63 mg/dL).    Allergies  Allergen Reactions  . Ampicillin     Zpac  . Sulfa Antibiotics     Antimicrobials this admission: 10/4 aztreonam/vancomycin  >> 10/5 10/5 Cefepime >>   Microbiology results: 10/4  MRSA PCR: negative   Thank you for allowing pharmacy to be a part of this patient's care.  Gardner Candle, PharmD, BCPS Clinical Pharmacist 08/12/2018 10:37 AM

## 2018-08-13 DIAGNOSIS — J9602 Acute respiratory failure with hypercapnia: Secondary | ICD-10-CM

## 2018-08-13 LAB — BLOOD GAS, ARTERIAL
Acid-Base Excess: 0.2 mmol/L (ref 0.0–2.0)
Allens test (pass/fail): POSITIVE — AB
Bicarbonate: 27.7 mmol/L (ref 20.0–28.0)
FIO2: 0.32
O2 Saturation: 92.3 %
PCO2 ART: 59 mmHg — AB (ref 32.0–48.0)
PO2 ART: 73 mmHg — AB (ref 83.0–108.0)
Patient temperature: 37
pH, Arterial: 7.28 — ABNORMAL LOW (ref 7.350–7.450)

## 2018-08-13 LAB — GLUCOSE, CAPILLARY
GLUCOSE-CAPILLARY: 130 mg/dL — AB (ref 70–99)
GLUCOSE-CAPILLARY: 111 mg/dL — AB (ref 70–99)
GLUCOSE-CAPILLARY: 121 mg/dL — AB (ref 70–99)
GLUCOSE-CAPILLARY: 114 mg/dL — AB (ref 70–99)
GLUCOSE-CAPILLARY: 114 mg/dL — AB (ref 70–99)

## 2018-08-13 MED ORDER — ENSURE ENLIVE PO LIQD
237.0000 mL | Freq: Three times a day (TID) | ORAL | Status: DC
  Administered 2018-08-16: 08:00:00 237 mL via ORAL

## 2018-08-13 MED ORDER — ACETAMINOPHEN 325 MG PO TABS
650.0000 mg | ORAL_TABLET | Freq: Four times a day (QID) | ORAL | Status: DC | PRN
  Administered 2018-08-13 – 2018-08-23 (×8): 650 mg via ORAL
  Filled 2018-08-13 (×10): qty 2

## 2018-08-13 MED ORDER — ADULT MULTIVITAMIN W/MINERALS CH
1.0000 | ORAL_TABLET | Freq: Every day | ORAL | Status: DC
  Administered 2018-08-14 – 2018-08-24 (×10): 1 via ORAL
  Filled 2018-08-13 (×11): qty 1

## 2018-08-13 MED ORDER — CALCIUM CARBONATE ANTACID 500 MG PO CHEW
400.0000 mg | CHEWABLE_TABLET | Freq: Three times a day (TID) | ORAL | Status: DC | PRN
  Administered 2018-08-13: 400 mg via ORAL
  Filled 2018-08-13 (×2): qty 2

## 2018-08-13 MED ORDER — QUETIAPINE FUMARATE 25 MG PO TABS
25.0000 mg | ORAL_TABLET | Freq: Two times a day (BID) | ORAL | Status: DC
  Administered 2018-08-13: 01:00:00 25 mg via ORAL
  Filled 2018-08-13 (×2): qty 1

## 2018-08-13 MED ORDER — ONDANSETRON HCL 4 MG/2ML IJ SOLN
4.0000 mg | Freq: Four times a day (QID) | INTRAMUSCULAR | Status: DC | PRN
  Administered 2018-08-13: 01:00:00 4 mg via INTRAVENOUS
  Filled 2018-08-13: qty 2

## 2018-08-13 MED ORDER — SODIUM CHLORIDE 0.9 % IV SOLN
INTRAVENOUS | Status: DC | PRN
  Administered 2018-08-13 – 2018-08-17 (×2): 250 mL via INTRAVENOUS

## 2018-08-13 NOTE — Consult Note (Signed)
Reason for Consult: Respiratory distress Referring Physician: Dr. Janean Sark [name] is an 84 y.o. female.  HPI: [name] is an 84 year old female with a past medical history remarkable for dementia, anxiety/depression, diabetes, gastroesophageal reflux disease, hyperlipidemia, neuropathy, vertigo, brought in from skilled nursing facility with altered mental status, found to be hypoxemic on presentation initially required BiPAP and subsequently weaned to nasal cannula, CT scan of the head was remarkable for sinus disease, CT scan of the chest was remarkable for right-sided pneumonia.  Overnight had worsening confusion, needs some Seroquel and was somnolent this morning.  Arterial blood gas revealed hypercapnic respiratory failure, PCO2 was 59 and pH of 7.28, patient was started on BiPAP and subsequently transferred to the intensive care unit.  Presently she is arousable on BiPAP but is confused.   Past Medical History:  Diagnosis Date  . Anxiety   . Depression   . Diabetes mellitus without complication ([place])   . GERD (gastroesophageal reflux disease)   . Hip fracture ([employer])   . Hyperlipemia   . Neuropathy   . Vertigo     Past Surgical History:  Procedure Laterality Date  . ABDOMINAL HYSTERECTOMY    . APPENDECTOMY    . BACK SURGERY     tumor removal bengin  . CHOLECYSTECTOMY      Family History  Problem Relation Age of Onset  . Diabetes Mother   . Diabetes Sister        x2  . Breast cancer Sister   . Heart disease Father   . Heart disease Brother        x3  . Bladder Cancer Neg Hx   . Kidney cancer Neg Hx   . Prostate cancer Neg Hx     Social History:  reports that she has never smoked. She has never used smokeless tobacco. She reports that she does not drink alcohol or use drugs.  Allergies:  Allergies  Allergen Reactions  . Ampicillin     Zpac  . Sulfa Antibiotics     Medications: I have reviewed the patient's current medications.  Results for orders placed  or performed during the hospital encounter of 08/11/18 (from the past 48 hour(s))  Lactic acid, plasma     Status: None   Collection Time: 08/11/18  3:51 PM  Result Value Ref Range   Lactic Acid, Venous 0.9 0.5 - 1.9 mmol/L    Comment: Performed at [facility], [address], [place], [place] [address]  Culture, blood (routine x 2)     Status: None (Preliminary result)   Collection Time: 08/11/18  3:51 PM  Result Value Ref Range   Specimen Description BLOOD L AC    Special Requests      BOTTLES DRAWN AEROBIC AND ANAEROBIC Blood Culture adequate volume   Culture      NO GROWTH 2 DAYS Performed at [facility], [address] [place], [place] [address]    Report Status PENDING   Urinalysis, Complete w Microscopic     Status: Abnormal   Collection Time: 08/11/18  3:52 PM  Result Value Ref Range   Color, Urine YELLOW (A) YELLOW   APPearance HAZY (A) CLEAR   Specific Gravity, Urine 1.023 1.005 - 1.030   pH 5.0 5.0 - 8.0   Glucose, UA NEGATIVE NEGATIVE mg/dL   Hgb urine dipstick SMALL (A) NEGATIVE   Bilirubin Urine NEGATIVE NEGATIVE   Ketones, ur 5 (A) NEGATIVE mg/dL   Protein, ur 100 (A) NEGATIVE mg/dL  Nitrite NEGATIVE NEGATIVE   Leukocytes, UA TRACE (A) NEGATIVE   RBC / HPF 0-5 0 - 5 RBC/hpf   WBC, UA 21-50 0 - 5 WBC/hpf   Bacteria, UA FEW (A) NONE SEEN   Squamous Epithelial / LPF 0-5 0 - 5   Mucus PRESENT     Comment: Performed at [facility], [address] [place], [place] [address]  Culture, blood (routine x 2)     Status: None (Preliminary result)   Collection Time: 08/11/18  4:20 PM  Result Value Ref Range   Specimen Description BLOOD RFA    Special Requests      BOTTLES DRAWN AEROBIC AND ANAEROBIC Blood Culture results may not be optimal due to an inadequate volume of blood received in culture bottles   Culture      NO GROWTH 2 DAYS Performed at [facility], [address] [place], [place] [address]    Report Status  PENDING   Lactic acid, plasma     Status: None   Collection Time: 08/11/18  7:24 PM  Result Value Ref Range   Lactic Acid, Venous 1.1 0.5 - 1.9 mmol/L    Comment: Performed at [facility], [address] [place], [place] [address]  Troponin I (q 6hr x 3)     Status: None   Collection Time: 08/11/18  7:24 PM  Result Value Ref Range   Troponin I <0.03 <0.03 ng/mL    Comment: Performed at [facility], [address] [place], [place] [address]  Troponin I (q 6hr x 3)     Status: None   Collection Time: 08/11/18 10:30 PM  Result Value Ref Range   Troponin I <0.03 <0.03 ng/mL    Comment: Performed at [facility], [address] [place], [place] [address]  CBC     Status: Abnormal   Collection Time: 08/11/18 10:30 PM  Result Value Ref Range   WBC 9.3 3.6 - 11.0 K/uL   RBC 4.06 3.80 - 5.20 MIL/uL   Hemoglobin 10.5 (L) 12.0 - 16.0 g/dL   HCT 34.0 (L) 35.0 - 47.0 %   MCV 83.9 80.0 - 100.0 fL   MCH 26.0 26.0 - 34.0 pg   MCHC 30.9 (L) 32.0 - 36.0 g/dL   RDW 14.4 11.5 - 14.5 %   Platelets 257 150 - 440 K/uL    Comment: Performed at [facility], [address] [place], [place] [address]  Creatinine, serum     Status: None   Collection Time: 08/11/18 10:30 PM  Result Value Ref Range   Creatinine, Ser 0.63 0.44 - 1.00 mg/dL   GFR calc non Af Amer >60 >60 mL/min   GFR calc Af Amer >60 >60 mL/min    Comment: (NOTE) The eGFR has been calculated using the CKD EPI equation. This calculation has not been validated in all clinical situations. eGFR's persistently <60 mL/min signify possible Chronic Kidney Disease. Performed at [facility], [address] [place], [place] [address]   Ammonia     Status: Abnormal   Collection Time: 08/11/18 10:30 PM  Result Value Ref Range   Ammonia 51 (H) 9 - 35 umol/L    Comment: Performed at [facility], [place], [place] [address]  Valproic acid level     Status: Abnormal    Collection Time: 08/11/18 10:30 PM  Result Value Ref Range   Valproic Acid Lvl 23 (L) 50.0 - 100.0 ug/mL  Comment: Performed at [facility], [address] [place], [name] [address]  Glucose, capillary     Status: Abnormal   Collection Time: 08/11/18 10:39 PM  Result Value Ref Range   Glucose-Capillary 144 (H) 70 - 99 mg/dL  MRSA PCR Screening     Status: None   Collection Time: 08/11/18 11:58 PM  Result Value Ref Range   MRSA by PCR NEGATIVE NEGATIVE    Comment:        The GeneXpert MRSA Assay (FDA approved for NASAL specimens only), is one component of a comprehensive MRSA colonization surveillance program. It is not intended to diagnose MRSA infection nor to guide or monitor treatment for MRSA infections. Performed at [facility], [address] [place], [place] [address]   Strep pneumoniae urinary antigen     Status: None   Collection Time: 08/12/18 12:14 AM  Result Value Ref Range   Strep Pneumo Urinary Antigen NEGATIVE NEGATIVE    Comment:        Infection due to S. pneumoniae cannot be absolutely ruled out since the antigen present may be below the detection limit of the test. Performed at [facility], [address] [place], [place] [address]   Troponin I (q 6hr x 3)     Status: Abnormal   Collection Time: 08/12/18  4:41 AM  Result Value Ref Range   Troponin I 0.03 (HH) <0.03 ng/mL    Comment: CRITICAL RESULT CALLED TO, READ BACK BY AND VERIFIED WITH DRISILLA JACKSON AT [phone] ON 08/12/18 BY SNJ Performed at [facility], [address] [place], [place] [address]   Basic metabolic panel     Status: Abnormal   Collection Time: 08/12/18  4:41 AM  Result Value Ref Range   Sodium 143 135 - 145 mmol/L   Potassium 3.7 3.5 - 5.1 mmol/L   Chloride 110 98 - 111 mmol/L   CO2 27 22 - 32 mmol/L   Glucose, Bld 155 (H) 70 - 99 mg/dL   BUN 19 8 - 23 mg/dL   Creatinine, Ser 0.58 0.44 - 1.00 mg/dL   Calcium 8.5 (L) 8.9 - 10.3  mg/dL   GFR calc non Af Amer >60 >60 mL/min   GFR calc Af Amer >60 >60 mL/min    Comment: (NOTE) The eGFR has been calculated using the CKD EPI equation. This calculation has not been validated in all clinical situations. eGFR's persistently <60 mL/min signify possible Chronic Kidney Disease.    Anion gap 6 5 - 15    Comment: Performed at [facility], [address] [place],  [address]  Glucose, capillary     Status: Abnormal   Collection Time: 08/12/18  7:45 AM  Result Value Ref Range   Glucose-Capillary 170 (H) 70 - 99 mg/dL  Glucose, capillary     Status: Abnormal   Collection Time: 08/12/18 11:48 AM  Result Value Ref Range   Glucose-Capillary 120 (H) 70 - 99 mg/dL  Glucose, capillary     Status: Abnormal   Collection Time: 08/12/18  4:29 PM  Result Value Ref Range   Glucose-Capillary 126 (H) 70 - 99 mg/dL  Glucose, capillary     Status: Abnormal   Collection Time: 08/12/18  8:27 PM  Result Value Ref Range   Glucose-Capillary 168 (H) 70 - 99 mg/dL   Comment 1 Document in Chart   Glucose, capillary     Status: Abnormal   Collection Time: 08/13/18  8:18 AM  Result Value Ref Range   Glucose-Capillary 130 (H) 70 - 99 mg/dL  Glucose, capillary     Status: Abnormal   Collection Time: 08/13/18 11:52 AM  Result Value Ref Range   Glucose-Capillary 111 (H) 70 - 99 mg/dL  Blood gas, arterial     Status: Abnormal   Collection Time: 08/13/18 12:04 PM  Result Value Ref Range   FIO2 0.32    Delivery systems NASAL CANNULA    pH, Arterial 7.28 (L) 7.350 - 7.450   pCO2 arterial 59 (H) 32.0 - 48.0 mmHg   pO2, Arterial 73 (L) 83.0 - 108.0 mmHg   Bicarbonate 27.7 20.0 - 28.0 mmol/L   Acid-Base Excess 0.2 0.0 - 2.0 mmol/L   O2 Saturation 92.3 %   Patient temperature 37.0    Collection site RIGHT BRACHIAL    Sample type ARTERIAL DRAW    Allens test (pass/fail) POSITIVE (A) PASS    Comment: Performed at [facility], [address] [place], [place]  [address]  Glucose, capillary     Status: Abnormal   Collection Time: 08/13/18  2:36 PM  Result Value Ref Range   Glucose-Capillary 121 (H) 70 - 99 mg/dL    No results found.  ROS  Patient is confused on BiPAP unable to cooperate with complete review of systems.  Please see HPI  Blood pressure (!) 110/45, pulse 89, temperature 98.5 F (36.9 C), temperature source Axillary, resp. rate (!) 24, height '5\' 4"'  (1.626 m), weight 84.3 kg, SpO2 94 %.   Physical Exam   Vital signs: Please see the above listed vital signs HEENT: Trachea is somewhat deviated towards right may be body habitus and positioning, mild accessory muscle utilization, patient is presently on BiPAP, limited oral exam, no clear jugular venous distention Cardiovascular: Regular rate and rhythm Abdominal: Positive bowel sounds, soft exam Extremities: No clubbing cyanosis or edema noted Neurologic: Vision confused, somnolent but arousable, limited exam Cutaneous: No rash or lesions noted  Assessment/Plan:  Acute hypercapnic respiratory failure.  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  Also has body habitus consistent with OSA/OHS.  Patient is on cefepime, albuterol, Atrovent, budesonide, not actively wheezing at this time will hold on systemic steroids, presently on BiPAP, will avoid sedative medications, follow-up arterial blood gas, continue to monitor closely in the intensive care unit.  Patient is a DNR  Anemia.  No evidence of active bleeding  Troponin is 0.03.  Does have right bundle branch block with left anterior hemiblock on EKG with repolarization abnormalities.  Most likely reflect supply demand ischemia  Hypoglycemia, scale coverage  Sinusitis on head CT  Maday Guarino 08/13/2018, 3:45 PM

## 2018-08-13 NOTE — Progress Notes (Signed)
MD, Cherlynn Kaiser- made aware that patient very lethargic at this time, responding to pain with grimace and moan but not able to stay awake, follow commands or answer questions. Per MD ok to hold medications at this time until patient is more alert, able to stay awake and follow commands.

## 2018-08-13 NOTE — Progress Notes (Signed)
Report given to Florentina Addison, RN- patient being transferred to ICU on stepdown status.

## 2018-08-13 NOTE — Progress Notes (Signed)
Patient remains somewhat lethargic, ABG showing hypercapnic respiratory failure.  Will place on BiPAP and transferred to stepdown level of care.  Will notify intensivist.

## 2018-08-13 NOTE — Progress Notes (Signed)
[employer] - [place] at [facility]   PATIENT NAME: [name]    MR#:  [id number]  DATE OF BIRTH:  1932-10-17  SUBJECTIVE:   Patient was quite agitated and sundowning overnight and therefore received some Seroquel and this morning is quite sleepy and sedated.  REVIEW OF SYSTEMS:    Review of Systems  Unable to perform ROS: Mental acuity   Nutrition: Dysphagia 1.  Tolerating Diet: Yes Tolerating PT: Await Eval.   DRUG ALLERGIES:   Allergies  Allergen Reactions  . Ampicillin     Zpac  . Sulfa Antibiotics     VITALS:  Blood pressure (!) 126/54, pulse 95, temperature 98.7 F (37.1 C), temperature source Oral, resp. rate 18, weight 84 kg, SpO2 95 %.  PHYSICAL EXAMINATION:   Physical Exam  GENERAL:  84 y.o.-year-old patient lying in bed lethargic/encephalopathic   EYES: Pupils equal, round, reactive to light and accommodation. No scleral icterus. Extraocular muscles intact.  HEENT: Head atraumatic, normocephalic. Oropharynx and nasopharynx clear.  Dry Oral Mucosa. NECK:  Supple, no jugular venous distention. No thyroid enlargement, no tenderness.  LUNGS: Poor Resp. effort, no wheezing, rales, rhonchi. No use of accessory muscles of respiration.  CARDIOVASCULAR: S1, S2 normal. No murmurs, rubs, or gallops.  ABDOMEN: Soft, nontender, nondistended. Bowel sounds present. No organomegaly or mass.  EXTREMITIES: No cyanosis, clubbing or edema b/l.    NEUROLOGIC: Lethargic/encephalopathic but responds to painful stimuli. PSYCHIATRIC: Lethargic/Enceophalopathic.  SKIN: No obvious rash, lesion, or ulcer.    LABORATORY PANEL:   CBC Recent Labs  Lab 08/11/18 2230  WBC 9.3  HGB 10.5*  HCT 34.0*  PLT 257   ------------------------------------------------------------------------------------------------------------------  Chemistries  Recent Labs  Lab 08/11/18 1251  08/12/18 0441  NA 143  --  143  K 3.6  --  3.7  CL 108  --  110  CO2 30  --  27   GLUCOSE 129*  --  155*  BUN 24*  --  19  CREATININE 0.82   < > 0.58  CALCIUM 9.0  --  8.5*  AST 14*  --   --   ALT 8  --   --   ALKPHOS 70  --   --   BILITOT 0.4  --   --    < > = values in this interval not displayed.   ------------------------------------------------------------------------------------------------------------------  Cardiac Enzymes Recent Labs  Lab 08/12/18 0441  TROPONINI 0.03*   ------------------------------------------------------------------------------------------------------------------  RADIOLOGY:  Ct Head Wo Contrast  Result Date: 08/11/2018 CLINICAL DATA:  Confusion, acute, unexplained. Progressive weakness. EXAM: CT HEAD WITHOUT CONTRAST TECHNIQUE: Contiguous axial images were obtained from the base of the skull through the vertex without intravenous contrast. COMPARISON:  CT head without contrast 05/21/2017 FINDINGS: Brain: Moderate atrophy and white matter changes are stable. A remote lacunar infarct is evident in the medial right cerebellum. No acute infarct, hemorrhage, or mass lesion is present. The basal ganglia are intact. Insular ribbon is normal. No significant extra-axial fluid collection is present. The ventricles are proportionate to the degree of atrophy. Vascular: Atherosclerotic calcifications present within the cavernous internal carotid arteries bilaterally. There is no asymmetric hyperdense vessel. Skull: Hyperostosis is noted. Calvarium is intact. No focal lytic or blastic lesion is present. Sinuses/Orbits: Extensive ethmoid sinus opacification is noted. There is mucosal thickening and a small fluid level in the left frontal sinus. The left maxillary sinus is near completely opacified. Circumferential mucosal thickening is present in the right maxillary sinus with a small fluid level.  Diffuse mucosal thickening is present throughout the sphenoid sinuses. There is fluid in the inferior left mastoid air cells. No obstructing nasopharyngeal  lesion is present. The globes and orbits are remarkable only for bilateral lens replacements. IMPRESSION: 1. Stable moderate atrophy and white matter disease. 2. No acute intracranial abnormality or significant interval change. 3. Extensive sinus disease. Electronically Signed   By: Marin Roberts M.D.   On: 08/11/2018 14:50   Ct Chest Wo Contrast  Result Date: 08/11/2018 CLINICAL DATA:  Shortness of breath, has been feeling bad for 1 week, history diabetes mellitus, hyperlipidemia, GERD EXAM: CT CHEST WITHOUT CONTRAST TECHNIQUE: Multidetector CT imaging of the chest was performed following the standard protocol without IV contrast. Sagittal and coronal MPR images reconstructed from axial data set. COMPARISON:  08/19/2005 FINDINGS: Cardiovascular: Atherosclerotic calcifications of aorta and coronary arteries. Aorta normal caliber. Enlargement of cardiac chambers. Mitral annular calcification. No pericardial effusion. Mediastinum/Nodes: Esophagus unremarkable. Marked enlargement of LEFT thyroid lobe 5.6 x 6.0 cm image 10 not significantly changed. Displacement of trachea to the RIGHT. Minimally enlarged LEFT paratracheal lymph node 11 mm short axis image 23, increased. Additional scattered normal sized mediastinal nodes. Minimally enlarged LEFT paratracheal node 11 mm short axis image 30. Lungs/Pleura: Peribronchial thickening and scattered atelectasis in the mid to lower lungs increased from previous exam. Minimal peribronchovascular infiltrate RIGHT middle lobe. No additional infiltrate, pleural effusion or pneumothorax. Upper Abdomen: Visualized upper abdomen unremarkable Musculoskeletal: Osseous demineralization with multilevel degenerative changes of the thoracic spine. Paraspinal surgical clips on RIGHT at approximately T7. IMPRESSION: Bronchitic changes with scattered atelectasis in the lower lobes. Mild peribronchovascular infiltrate in RIGHT middle lobe question pneumonia. Extensive atherosclerotic  disease calcification. 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 Enlargement of cardiac chambers. Few minimally enlarged nonspecific mediastinal lymph nodes. No other acute intrathoracic abnormalities. Aortic Atherosclerosis (ICD10-I70.0). Electronically Signed   By: Ulyses Southward M.D.   On: 08/11/2018 14:56   Dg Chest Port 1 View  Result Date: 08/11/2018 CLINICAL DATA:  Cough, has felt bad for 1 week EXAM: PORTABLE CHEST 1 VIEW COMPARISON:  Portable exam 1310 hours compared to 05/21/2017 FINDINGS: Enlargement of cardiac silhouette with pulmonary vascular congestion. Atherosclerotic calcification aorta. Hazy interstitial infiltrates which could represent edema or infection. Atelectasis at RIGHT base. No gross pleural effusion or pneumothorax. Bones demineralized. IMPRESSION: Enlargement of cardiac silhouette with pulmonary vascular congestion. Mild BILATERAL interstitial infiltrates question minimal pulmonary edema versus infection. RIGHT basilar atelectasis. Electronically Signed   By: Ulyses Southward M.D.   On: 08/11/2018 13:21     ASSESSMENT AND PLAN:   84 year old female with past medical history of diabetes, hypertension, GERD, hyperlipidemia, anxiety/depression, neuropathy who presented to the hospital due to weakness, cough and altered mental status and noted to have pneumonia.  1.  Altered mental status- metabolic encephalopathy secondary to mild hypercapnia and underlying pneumonia.  Patient was quite agitated and sundowning overnight and therefore got some Seroquel and now is encephalopathic. - If not improving would consider repeating an ABG to rule out worsening hypercapnia. - cont. IV antibiotics to treat underlying pneumonia.  2.  Pneumonia-this is the source of patient's weakness and shortness of breath. - Continue IV cefepime, MRSA PCR was negative therefore taken off vancomycin. -Follow cultures which are (-) so far, clinically afebrile  and hemodynamically stable.  3.  COPD-mild acute exacerbation secondary to pneumonia. -Continue scheduled duo nebs, Pulmicort nebs.  4.  History of urinary incontinence-continue oxybutynin.  5.  Until  hypertension-continue Avapro  6. Anxiety/depresion - cont. Depakote.     All the records are reviewed and case discussed with Care Management/Social Worker. Management plans discussed with the patient, family and they are in agreement.  CODE STATUS: DNR  DVT Prophylaxis: Lovenox  TOTAL TIME TAKING CARE OF THIS PATIENT: 30 minutes.   POSSIBLE D/C IN 1-2 DAYS, DEPENDING ON CLINICAL CONDITION.   Houston Siren M.D on 08/13/2018 at 11:41 AM  Between 7am to 6pm - Pager - [phone]  After 6pm go to [email or web address] - Scientist, research (life sciences) [facility]  Office  [phone]  CC: Primary care physician; Lauro Regulus, MD

## 2018-08-13 NOTE — Progress Notes (Signed)
Initial Nutrition Assessment  DOCUMENTATION CODES:   Obesity unspecified  INTERVENTION:  Provide Ensure Enlive po TID, each supplement provides 350 kcal and 20 grams of protein.  Provide daily MVI.  NUTRITION DIAGNOSIS:   Inadequate oral intake related to lethargy/confusion as evidenced by meal completion < 50%.  GOAL:   Patient will meet greater than or equal to 90% of their needs  MONITOR:   PO intake, Supplement acceptance, Diet advancement, Labs, Weight trends, I & O's, Skin  REASON FOR ASSESSMENT:   Consult Assessment of nutrition requirement/status  ASSESSMENT:   84 year old female with PMHx of depression, anxiety, GERD, vertigo, DM, neuropathy who is admitted with AMS, PNA, mild acute exacerbation of COPD.   -Following SLP evaluation on 10/5 patient was placed on dysphagia 1 diet with thin liquids.  Met with patient at bedside. She is unable to provide any history today. No family members present at time of RD assessment. Per chart she is from [facility]. Per documentation in chart patient ate 0% of her breakfast this morning.  There is a limited weight history in chart, but patient appears fairly weight stable. She was 86.2 kg on 05/10/2017 and is currently 84 kg (185.19 lbs).   Medications reviewed and include: Novolog 0-15 units TID, Novolog 0-5 units QHS, Seroquel 25 mg BID, NS with KCl 20 mEq/L at 75 mL/hr, cefepime, famotidine.  Labs reviewed: CBG 111-170.  Patient does not meet criteria for malnutrition at this time.  NUTRITION - FOCUSED PHYSICAL EXAM:    Most Recent Value  Orbital Region  No depletion  Upper Arm Region  Mild depletion  Thoracic and Lumbar Region  No depletion  Buccal Region  No depletion  Temple Region  No depletion  Clavicle Bone Region  No depletion  Clavicle and Acromion Bone Region  Mild depletion  Scapular Bone Region  Unable to assess  Dorsal Hand  No depletion  Patellar Region  No depletion  Anterior Thigh Region  No  depletion  Posterior Calf Region  No depletion  Edema (RD Assessment)  None  Hair  Reviewed  Eyes  Unable to assess  Mouth  Unable to assess  Skin  Reviewed  Nails  Reviewed     Diet Order:   Diet Order            DIET - DYS 1 Room service appropriate? Yes with Assist; Fluid consistency: Thin  Diet effective now              EDUCATION NEEDS:   Not appropriate for education at this time  Skin:  Skin Assessment: Skin Integrity Issues:(MSAD to ankle, breast, and groin)  Last BM:  08/12/2018 - small type 6  Height:   Ht Readings from Last 1 Encounters:  06/14/18 5' 5.5" (1.664 m)    Weight:   Wt Readings from Last 1 Encounters:  08/11/18 84 kg    Ideal Body Weight:  57.95 kg(calculated from height of 5' 5.5" from previous encounter)  BMI:  Body mass index is 30.35 kg/m.  Estimated Nutritional Needs:   Kcal:  1575-1830 (MSJ x 1.2-1.4)  Protein:  85-100 grams (1-1.2 grams/kg)  Fluid:  1.5 L/day (25 mL/kg IBW)  Willey Blade, MS, RD, LDN Office: [phone] Pager: [phone] After Hours/Weekend Pager: [phone]

## 2018-08-13 NOTE — Progress Notes (Signed)
Pt was very restless throughout the night. Often calling out in distress but unable to remember why she called out for help. A&Ox1. MD aware and new orders given. Pt resting peacefully after medication admin. VS stable. Decreased agitation and restlessness. Pt responds to voice and wakes easily. Will continue to monitor.

## 2018-08-14 LAB — GLUCOSE, CAPILLARY
GLUCOSE-CAPILLARY: 136 mg/dL — AB (ref 70–99)
Glucose-Capillary: 100 mg/dL — ABNORMAL HIGH (ref 70–99)
Glucose-Capillary: 144 mg/dL — ABNORMAL HIGH (ref 70–99)
Glucose-Capillary: 121 mg/dL — ABNORMAL HIGH (ref 70–99)
Glucose-Capillary: 136 mg/dL — ABNORMAL HIGH (ref 70–99)

## 2018-08-14 LAB — BASIC METABOLIC PANEL
Anion gap: 5 (ref 5–15)
BUN: 15 mg/dL (ref 8–23)
CALCIUM: 8.8 mg/dL — AB (ref 8.9–10.3)
CO2: 27 mmol/L (ref 22–32)
Chloride: 113 mmol/L — ABNORMAL HIGH (ref 98–111)
Creatinine, Ser: 0.69 mg/dL (ref 0.44–1.00)
Glucose, Bld: 173 mg/dL — ABNORMAL HIGH (ref 70–99)
POTASSIUM: 4.2 mmol/L (ref 3.5–5.1)
Sodium: 145 mmol/L (ref 135–145)

## 2018-08-14 LAB — LEGIONELLA PNEUMOPHILA SEROGP 1 UR AG: L. PNEUMOPHILA SEROGP 1 UR AG: NEGATIVE

## 2018-08-14 LAB — HIV ANTIBODY (ROUTINE TESTING W REFLEX): HIV SCREEN 4TH GENERATION: NONREACTIVE

## 2018-08-14 LAB — RPR: RPR Ser Ql: NONREACTIVE

## 2018-08-14 MED ORDER — LABETALOL HCL 5 MG/ML IV SOLN
10.0000 mg | Freq: Once | INTRAVENOUS | Status: AC
  Administered 2018-08-14: 10 mg via INTRAVENOUS
  Filled 2018-08-14: qty 4

## 2018-08-14 MED ORDER — LORAZEPAM 2 MG/ML IJ SOLN
0.5000 mg | Freq: Four times a day (QID) | INTRAMUSCULAR | Status: DC | PRN
  Administered 2018-08-14 – 2018-08-15 (×3): 0.5 mg via INTRAVENOUS
  Filled 2018-08-14 (×3): qty 1

## 2018-08-14 MED ORDER — LORAZEPAM 2 MG/ML IJ SOLN
INTRAMUSCULAR | Status: AC
  Administered 2018-08-14: 0.5 mg via INTRAVENOUS
  Filled 2018-08-14: qty 1

## 2018-08-14 MED ORDER — FAMOTIDINE 20 MG PO TABS
20.0000 mg | ORAL_TABLET | Freq: Every day | ORAL | Status: DC
  Administered 2018-08-14 – 2018-08-24 (×10): 20 mg via ORAL
  Filled 2018-08-14 (×11): qty 1

## 2018-08-14 MED ORDER — HALOPERIDOL LACTATE 5 MG/ML IJ SOLN
2.5000 mg | Freq: Four times a day (QID) | INTRAMUSCULAR | Status: DC | PRN
  Administered 2018-08-14 – 2018-08-17 (×7): 2.5 mg via INTRAVENOUS
  Filled 2018-08-14 (×9): qty 1

## 2018-08-14 MED ORDER — HYDRALAZINE HCL 20 MG/ML IJ SOLN
10.0000 mg | Freq: Four times a day (QID) | INTRAMUSCULAR | Status: DC | PRN
  Administered 2018-08-14 – 2018-08-18 (×7): 10 mg via INTRAVENOUS
  Filled 2018-08-14 (×8): qty 1

## 2018-08-14 MED ORDER — LORAZEPAM 2 MG/ML IJ SOLN
0.5000 mg | Freq: Once | INTRAMUSCULAR | Status: AC
  Administered 2018-08-14: 0.5 mg via INTRAVENOUS

## 2018-08-14 NOTE — Progress Notes (Signed)
[facility] - [place] at [facility]   PATIENT NAME: [name]    MR#:  [id number]  DATE OF BIRTH:  1931-11-21  SUBJECTIVE:   Pt. Was transferred to stepdown for Bipap yesterday.  Mental status much improved and hypercapnia has improved.  Pt. Is more alert and talking today.   REVIEW OF SYSTEMS:    Review of Systems  Unable to perform ROS: Mental acuity   Nutrition: Dysphagia 1 Tolerating Diet: Yes Tolerating PT: Await Eval.   DRUG ALLERGIES:   Allergies  Allergen Reactions  . Ampicillin     Zpac  . Sulfa Antibiotics     VITALS:  Blood pressure (!) 141/69, pulse (!) 107, temperature 98.4 F (36.9 C), temperature source Oral, resp. rate (!) 23, height 5\' 4"  (1.626 m), weight 84.3 kg, SpO2 95 %.  PHYSICAL EXAMINATION:   Physical Exam  GENERAL:  84 y.o.-year-old patient lying in bed lethargic/encephalopathic   EYES: Pupils equal, round, reactive to light and accommodation. No scleral icterus. Extraocular muscles intact.  HEENT: Head atraumatic, normocephalic. Oropharynx and nasopharynx clear.  Dry Oral Mucosa. NECK:  Supple, no jugular venous distention. No thyroid enlargement, no tenderness.  LUNGS: Poor Resp. effort, no wheezing, rales, rhonchi. No use of accessory muscles of respiration.  CARDIOVASCULAR: S1, S2 normal. No murmurs, rubs, or gallops.  ABDOMEN: Soft, nontender, nondistended. Bowel sounds present. No organomegaly or mass.  EXTREMITIES: No cyanosis, clubbing or edema b/l.    NEUROLOGIC: Globally weak, no focal motor or sensory deficits appreciated bilaterally. PSYCHIATRIC: AAO X 1.    SKIN: No obvious rash, lesion, or ulcer.    LABORATORY PANEL:   CBC Recent Labs  Lab 08/11/18 2230  WBC 9.3  HGB 10.5*  HCT 34.0*  PLT 257   ------------------------------------------------------------------------------------------------------------------  Chemistries  Recent Labs  Lab 08/11/18 1251  08/14/18 1102  NA 143   < > 145  K 3.6    < > 4.2  CL 108   < > 113*  CO2 30   < > 27  GLUCOSE 129*   < > 173*  BUN 24*   < > 15  CREATININE 0.82   < > 0.69  CALCIUM 9.0   < > 8.8*  AST 14*  --   --   ALT 8  --   --   ALKPHOS 70  --   --   BILITOT 0.4  --   --    < > = values in this interval not displayed.   ------------------------------------------------------------------------------------------------------------------  Cardiac Enzymes Recent Labs  Lab 08/12/18 0441  TROPONINI 0.03*   ------------------------------------------------------------------------------------------------------------------  RADIOLOGY:  No results found.   ASSESSMENT AND PLAN:   84 year old female with past medical history of diabetes, hypertension, GERD, hyperlipidemia, anxiety/depression, neuropathy who presented to the hospital due to weakness, cough and altered mental status and noted to have pneumonia.  1.  Altered mental status- metabolic encephalopathy secondary to mild hypercapnia and underlying pneumonia.   -Patient was hypercapnic yesterday and transferred to the stepdown level of care and placed on BiPAP.  Much improved more awake and alert today. - Hold other sedative meds including Seroquel, Ativan for now.  2.  Pneumonia-this is the source of patient's weakness and shortness of breath. - Continue IV cefepime,  -Follow cultures which are (-) so far, clinically afebrile and hemodynamically stable.  3.  COPD-mild acute exacerbation secondary to pneumonia. -Continue scheduled duo nebs, Pulmicort nebs.  4.  History of urinary incontinence-continue oxybutynin.  5.  Essential  hypertension-continue Avapro  6. Anxiety/depresion - Taken off Depakote now due to its sedative effects and hypercapnia.      All the records are reviewed and case discussed with Care Management/Social Worker. Management plans discussed with the patient, family and they are in agreement.  CODE STATUS: DNR  DVT Prophylaxis: Lovenox  TOTAL TIME  TAKING CARE OF THIS PATIENT: 30 minutes.   POSSIBLE D/C IN 1-2 DAYS, DEPENDING ON CLINICAL CONDITION.   Houston Siren M.D on 08/14/2018 at 3:29 PM  Between 7am to 6pm - Pager - [phone]  After 6pm go to [email or web address] - Scientist, research (life sciences) [facility]  Office  [phone]  CC: Primary care physician; Lauro Regulus, MD

## 2018-08-14 NOTE — Progress Notes (Signed)
PHARMACIST - PHYSICIAN COMMUNICATION  CONCERNING: IV to Oral Route Change Policy  RECOMMENDATION: This patient is receiving famotidine by the intravenous route.  Based on criteria approved by the Pharmacy and Therapeutics Committee, the intravenous medication(s) is/are being converted to the equivalent oral dose form(s).   DESCRIPTION: These criteria include:  The patient is eating (either orally or via tube) and/or has been taking other orally administered medications for a least 24 hours  The patient has no evidence of active gastrointestinal bleeding or impaired GI absorption (gastrectomy, short bowel, patient on TNA or NPO).  If you have questions about this conversion, please contact the Pharmacy Department  []   [phone] )  [facility] [x]   [phone] )  [facility] []   [phone] )  [facility] []   [phone] )  [facility] []   [phone] )  [facility]   Ellise Kovack L, [facility] 08/14/2018 10:44 AM

## 2018-08-14 NOTE — Progress Notes (Signed)
Follow up - Critical Care Medicine Note  Patient Details:    [name] is an 84 y.o. female.with a past medical history remarkable for dementia, anxiety/depression, diabetes, gastroesophageal reflux disease, hyperlipidemia, neuropathy, vertigo, brought in from skilled nursing facility with altered mental status, found to be hypoxemic on presentation initially required BiPAP and subsequently weaned to nasal cannula, CT scan of the head was remarkable for sinus disease, CT scan of the chest was remarkable for right-sided pneumonia.  Overnight had worsening confusion, needs some Seroquel and was somnolent this morning.  Arterial blood gas revealed hypercapnic respiratory failure, PCO2 was 59 and pH of 7.28, patient was started on BiPAP and subsequently transferred to the intensive care unit.     Lines, Airways, Drains: External Urinary Catheter (Active)  Collection Container Dedicated Suction Canister 08/14/2018  1:00 AM  Securement Method Other (Comment) 08/14/2018  1:00 AM  Output (mL) 200 mL 08/14/2018  6:00 AM    Anti-infectives:  Anti-infectives (From admission, onward)   Start     Dose/Rate Route Frequency Ordered Stop   08/12/18 1200  ceFEPIme (MAXIPIME) 2 g in sodium chloride 0.9 % 100 mL IVPB     2 g 200 mL/hr over 30 Minutes Intravenous Every 12 hours 08/12/18 1035     08/12/18 0600  vancomycin (VANCOCIN) IVPB 1000 mg/200 mL premix  Status:  Discontinued     1,000 mg 200 mL/hr over 60 Minutes Intravenous Every 18 hours 08/12/18 0016 08/12/18 1034   08/11/18 2215  aztreonam (AZACTAM) 2 g in sodium chloride 0.9 % 100 mL IVPB  Status:  Discontinued     2 g 200 mL/hr over 30 Minutes Intravenous Every 8 hours 08/11/18 2201 08/12/18 1034   08/11/18 1800  vancomycin (VANCOCIN) IVPB 1000 mg/200 mL premix     1,000 mg 200 mL/hr over 60 Minutes Intravenous  Once 08/11/18 1745 08/11/18 2323   08/11/18 1515  levofloxacin (LEVAQUIN) IVPB 750 mg     750 mg 100 mL/hr over 90 Minutes  Intravenous  Once 08/11/18 1512 08/11/18 1749      Microbiology: Results for orders placed or performed during the hospital encounter of 08/11/18  Culture, blood (routine x 2)     Status: None (Preliminary result)   Collection Time: 08/11/18  3:51 PM  Result Value Ref Range Status   Specimen Description BLOOD L AC  Final   Special Requests   Final    BOTTLES DRAWN AEROBIC AND ANAEROBIC Blood Culture adequate volume   Culture   Final    NO GROWTH 3 DAYS Performed at [id number], [address] [place], [place] [address]    Report Status PENDING  Incomplete  Culture, blood (routine x 2)     Status: None (Preliminary result)   Collection Time: 08/11/18  4:20 PM  Result Value Ref Range Status   Specimen Description BLOOD RFA  Final   Special Requests   Final    BOTTLES DRAWN AEROBIC AND ANAEROBIC Blood Culture results may not be optimal due to an inadequate volume of blood received in culture bottles   Culture   Final    NO GROWTH 3 DAYS Performed at [facility], [address] [place], [place] [address]    Report Status PENDING  Incomplete  MRSA PCR Screening     Status: None   Collection Time: 08/11/18 11:58 PM  Result Value Ref Range Status   MRSA by PCR NEGATIVE NEGATIVE Final    Comment:  The GeneXpert MRSA Assay (FDA approved for NASAL specimens only), is one component of a comprehensive MRSA colonization surveillance program. It is not intended to diagnose MRSA infection nor to guide or monitor treatment for MRSA infections. Performed at [facility], [address], [place], [place] [address]      Studies: Ct Head Wo Contrast  Result Date: 08/11/2018 CLINICAL DATA:  Confusion, acute, unexplained. Progressive weakness. EXAM: CT HEAD WITHOUT CONTRAST TECHNIQUE: Contiguous axial images were obtained from the base of the skull through the vertex without intravenous contrast. COMPARISON:  CT head without contrast 05/21/2017  FINDINGS: Brain: Moderate atrophy and white matter changes are stable. A remote lacunar infarct is evident in the medial right cerebellum. No acute infarct, hemorrhage, or mass lesion is present. The basal ganglia are intact. Insular ribbon is normal. No significant extra-axial fluid collection is present. The ventricles are proportionate to the degree of atrophy. Vascular: Atherosclerotic calcifications present within the cavernous internal carotid arteries bilaterally. There is no asymmetric hyperdense vessel. Skull: Hyperostosis is noted. Calvarium is intact. No focal lytic or blastic lesion is present. Sinuses/Orbits: Extensive ethmoid sinus opacification is noted. There is mucosal thickening and a small fluid level in the left frontal sinus. The left maxillary sinus is near completely opacified. Circumferential mucosal thickening is present in the right maxillary sinus with a small fluid level. Diffuse mucosal thickening is present throughout the sphenoid sinuses. There is fluid in the inferior left mastoid air cells. No obstructing nasopharyngeal lesion is present. The globes and orbits are remarkable only for bilateral lens replacements. IMPRESSION: 1. Stable moderate atrophy and white matter disease. 2. No acute intracranial abnormality or significant interval change. 3. Extensive sinus disease. Electronically Signed   By: Marin Roberts M.D.   On: 08/11/2018 14:50   Ct Chest Wo Contrast  Result Date: 08/11/2018 CLINICAL DATA:  Shortness of breath, has been feeling bad for 1 week, history diabetes mellitus, hyperlipidemia, GERD EXAM: CT CHEST WITHOUT CONTRAST TECHNIQUE: Multidetector CT imaging of the chest was performed following the standard protocol without IV contrast. Sagittal and coronal MPR images reconstructed from axial data set. COMPARISON:  08/19/2005 FINDINGS: Cardiovascular: Atherosclerotic calcifications of aorta and coronary arteries. Aorta normal caliber. Enlargement of cardiac  chambers. Mitral annular calcification. No pericardial effusion. Mediastinum/Nodes: Esophagus unremarkable. Marked enlargement of LEFT thyroid lobe 5.6 x 6.0 cm image 10 not significantly changed. Displacement of trachea to the RIGHT. Minimally enlarged LEFT paratracheal lymph node 11 mm short axis image 23, increased. Additional scattered normal sized mediastinal nodes. Minimally enlarged LEFT paratracheal node 11 mm short axis image 30. Lungs/Pleura: Peribronchial thickening and scattered atelectasis in the mid to lower lungs increased from previous exam. Minimal peribronchovascular infiltrate RIGHT middle lobe. No additional infiltrate, pleural effusion or pneumothorax. Upper Abdomen: Visualized upper abdomen unremarkable Musculoskeletal: Osseous demineralization with multilevel degenerative changes of the thoracic spine. Paraspinal surgical clips on RIGHT at approximately T7. IMPRESSION: Bronchitic changes with scattered atelectasis in the lower lobes. Mild peribronchovascular infiltrate in RIGHT middle lobe question pneumonia. Extensive atherosclerotic disease calcification. 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 Enlargement of cardiac chambers. Few minimally enlarged nonspecific mediastinal lymph nodes. No other acute intrathoracic abnormalities. Aortic Atherosclerosis (ICD10-I70.0). Electronically Signed   By: Ulyses Southward M.D.   On: 08/11/2018 14:56   Dg Chest Port 1 View  Result Date: 08/11/2018 CLINICAL DATA:  Cough, has felt bad for 1 week EXAM: PORTABLE CHEST 1 VIEW COMPARISON:  Portable exam 1310 hours  compared to 05/21/2017 FINDINGS: Enlargement of cardiac silhouette with pulmonary vascular congestion. Atherosclerotic calcification aorta. Hazy interstitial infiltrates which could represent edema or infection. Atelectasis at RIGHT base. No gross pleural effusion or pneumothorax. Bones demineralized. IMPRESSION: Enlargement of cardiac silhouette  with pulmonary vascular congestion. Mild BILATERAL interstitial infiltrates question minimal pulmonary edema versus infection. RIGHT basilar atelectasis. Electronically Signed   By: Ulyses Southward M.D.   On: 08/11/2018 13:21    Consults:    Subjective:    Overnight Issues: Patient had uneventful evening, weaned off of BiPAP, presently on nasal cannula, still confused but awake and interactive  Objective:  Vital signs for last 24 hours: Temp:  [98.4 F (36.9 C)-99.4 F (37.4 C)] 98.4 F (36.9 C) (10/07 0700) Pulse Rate:  [81-99] 97 (10/07 0723) Resp:  [0-30] 23 (10/07 0723) BP: (110-165)/(45-90) 159/60 (10/07 0723) SpO2:  [93 %-99 %] 96 % (10/07 0807) FiO2 (%):  [36 %] 36 % (10/07 0100) Weight:  [84.3 kg] 84.3 kg (10/06 1434)  Hemodynamic parameters for last 24 hours:    Intake/Output from previous day: 10/06 0701 - 10/07 0700 In: 1863 [I.V.:1612.9; IV Piggyback:250.1] Out: 400 [Urine:400]  Intake/Output this shift: Total I/O In: 80.1 [I.V.:80.1] Out: -   Vent settings for last 24 hours: FiO2 (%):  [36 %] 36 %  Physical Exam:  Vital signs:       Please see the above listed vital signs HEENT:           Trachea is somewhat deviated towards right may be body habitus and positioning, mild accessory muscle utilization, patient is presently on BiPAP, limited oral exam, no clear jugular venous distention Cardiovascular:           Regular rate and rhythm Abdominal:      Positive bowel sounds, soft exam Extremities:     No clubbing cyanosis or edema noted Neurologic:      Vision confused, somnolent but arousable, limited exam Cutaneous:      No rash or lesions noted  Assessment/Plan:  Acute hypercapnic respiratory failure.  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  Also has body habitus consistent with OSA/OHS.  Patient is on cefepime, albuterol, Atrovent, budesonide, not actively wheezing at this time  will hold on systemic steroids, presently on BiPAP, will avoid sedative medications, follow-up arterial blood gas, continue to monitor closely in the intensive care unit.  Patient is a DNR  Anemia.  No evidence of active bleeding  Troponin is 0.03.  Does have right bundle branch block with left anterior hemiblock on EKG with repolarization abnormalities.  Most likely reflect supply demand ischemia  Hyperglycemia, scale coverage  Sinusitis on head CT  Willowdean Luhmann 08/14/2018  *Care during the described time interval was provided by me and/or other providers on the critical care team.  I have reviewed this patient's available data, including medical history, events of note, physical examination and test results as part of my evaluation.

## 2018-08-14 NOTE — Progress Notes (Signed)
Pharmacy Antibiotic Note  [name] is a 84 y.o. female admitted on 08/11/2018 with pneumonia. Patient with right sided PNA on CT scan. Patient has been weaned off BiPAP and is being transferred to the floor. Pharmacy has been consulted for cefepime dosing.   Plan: Continue cefepime 2g IV Q12hr for total treatment course of 8 days.   Height: 5\' 4"  (162.6 cm) Weight: 185 lb 13.6 oz (84.3 kg) IBW/kg (Calculated) : 54.7  Temp (24hrs), Avg:98.9 F (37.2 C), Min:98.4 F (36.9 C), Max:99.4 F (37.4 C)  Recent Labs  Lab 08/11/18 1251 08/11/18 1551 08/11/18 1924 08/11/18 2230 08/12/18 0441 08/14/18 1102  WBC 7.0  --   --  9.3  --   --   CREATININE 0.82  --   --  0.63 0.58 0.69  LATICACIDVEN  --  0.9 1.1  --   --   --     Estimated Creatinine Clearance: 55.9 mL/min (by C-G formula based on SCr of 0.69 mg/dL).    Allergies  Allergen Reactions  . Ampicillin     Zpac  . Sulfa Antibiotics     Antimicrobials this admission: Levofloxacin 10/4 x 1 Aztreonam 10/4 >> 10/5  Vancomycin 10/4 >> 10/5 Cefepime 10/5 >> 10/11  Dose adjustments this admission: N/A  Microbiology results: 10/4 BCx: no growth x 3 days  10/4 MRSA PCR: negative   Thank you for allowing pharmacy to be a part of this patient's care.  Juhi Lagrange L 08/14/2018 4:43 PM

## 2018-08-14 NOTE — Progress Notes (Signed)
SLP Cancellation Note  Patient Details Name: [name] MRN: [id number] DOB: [birth year]   Cancelled treatment:       Reason Eval/Treat Not Completed: Patient not medically ready(chart reviewed; pt quite confused and calling out.). NSG reported pt did well w/ breakfast meal this AM; she appears to be tolerating current pureed diet w/ thin liquids adequately. ST services will f/u tomorrow w/ toleration of diet. NSG agreed.     Jerilynn Som, MS, CCC-SLP [name],Jessica 08/14/2018, 5:04 PM

## 2018-08-14 NOTE — Progress Notes (Signed)
Report given to Jessica Mayers RN for patient to be transferred to room 125 with 1 bag of personal belongings and son [name] contacted and aware of transfer.

## 2018-08-14 NOTE — Care Plan (Signed)
Took pts vitals bp 191/109 hr 124 at 1818. Sueanne Margarita spoke with Dr. Nemiah Commander and received orders. Lebetolol 10 mg IV was given. at 1845 bp was 180/74 hr 99.Will relay info to next nurse at 1900. Needs to be closely monitored.   Torrie Mayers RN

## 2018-08-15 DIAGNOSIS — Z515 Encounter for palliative care: Secondary | ICD-10-CM

## 2018-08-15 DIAGNOSIS — Z7189 Other specified counseling: Secondary | ICD-10-CM

## 2018-08-15 LAB — AMMONIA: AMMONIA: 19 umol/L (ref 9–35)

## 2018-08-15 LAB — GLUCOSE, CAPILLARY
GLUCOSE-CAPILLARY: 112 mg/dL — AB (ref 70–99)
Glucose-Capillary: 157 mg/dL — ABNORMAL HIGH (ref 70–99)
Glucose-Capillary: 127 mg/dL — ABNORMAL HIGH (ref 70–99)
Glucose-Capillary: 287 mg/dL — ABNORMAL HIGH (ref 70–99)

## 2018-08-15 LAB — BLOOD GAS, ARTERIAL
Acid-Base Excess: 3.4 mmol/L — ABNORMAL HIGH (ref 0.0–2.0)
Bicarbonate: 29.1 mmol/L — ABNORMAL HIGH (ref 20.0–28.0)
FIO2: 0.36
O2 SAT: 94.7 %
PATIENT TEMPERATURE: 37
pCO2 arterial: 48 mmHg (ref 32.0–48.0)
pH, Arterial: 7.39 (ref 7.350–7.450)
pO2, Arterial: 75 mmHg — ABNORMAL LOW (ref 83.0–108.0)

## 2018-08-15 MED ORDER — HALOPERIDOL LACTATE 5 MG/ML IJ SOLN
1.0000 mg | Freq: Once | INTRAMUSCULAR | Status: AC
  Administered 2018-08-15: 1 mg via INTRAVENOUS

## 2018-08-15 MED ORDER — SODIUM CHLORIDE 0.9 % IV SOLN
INTRAVENOUS | Status: AC
  Administered 2018-08-15 – 2018-08-16 (×2): via INTRAVENOUS

## 2018-08-15 MED ORDER — CLONIDINE HCL 0.1 MG/24HR TD PTWK
0.1000 mg | MEDICATED_PATCH | TRANSDERMAL | Status: DC
  Administered 2018-08-15: 0.1 mg via TRANSDERMAL
  Filled 2018-08-15: qty 1

## 2018-08-15 NOTE — Care Management Important Message (Signed)
Important Message  Patient Details  Name: [name] MRN: [id number] Date of Birth: [birth year]   Medicare Important Message Given:  Yes    Olegario Messier A [name] 08/15/2018, 10:14 AM

## 2018-08-15 NOTE — Progress Notes (Signed)
[facility] - [place] at [facility]   PATIENT NAME: [name]    MR#:  [id number]  DATE OF BIRTH:  04-25-1932  SUBJECTIVE:   Patient quite encephalopathic again this morning and she received multiple doses of IV Ativan overnight.  ABG obtained this morning showing no hypercapnia.  Patient's family is at bedside.  REVIEW OF SYSTEMS:    Review of Systems  Unable to perform ROS: Mental acuity   Nutrition: Dysphagia 1 Tolerating Diet: No Tolerating PT: Await Eval.   DRUG ALLERGIES:   Allergies  Allergen Reactions  . Ampicillin     Zpac  . Sulfa Antibiotics     VITALS:  Blood pressure (!) 189/89, pulse (!) 110, temperature 98.5 F (36.9 C), temperature source Oral, resp. rate 20, height 5\' 4"  (1.626 m), weight 84.3 kg, SpO2 95 %.  PHYSICAL EXAMINATION:   Physical Exam  GENERAL:  84 y.o.-year-old patient lying in bed lethargic/encephalopathic   EYES: Pupils equal, round, reactive to light. No scleral icterus. Extraocular muscles intact.  HEENT: Head atraumatic, normocephalic. Oropharynx and nasopharynx clear.  Dry Oral Mucosa. NECK:  Supple, no jugular venous distention. No thyroid enlargement, no tenderness.  LUNGS: Poor Resp. effort, no wheezing, rales, rhonchi. No use of accessory muscles of respiration.  CARDIOVASCULAR: S1, S2 normal. No murmurs, rubs, or gallops.  ABDOMEN: Soft, nontender, nondistended. Bowel sounds present. No organomegaly or mass.  EXTREMITIES: No cyanosis, clubbing or edema b/l.    NEUROLOGIC: Globally weak, no focal motor or sensory deficits appreciated bilaterally. Responds to painful stimuli.  PSYCHIATRIC: AAO X 1.    SKIN: No obvious rash, lesion, or ulcer.    LABORATORY PANEL:   CBC Recent Labs  Lab 08/11/18 2230  WBC 9.3  HGB 10.5*  HCT 34.0*  PLT 257   ------------------------------------------------------------------------------------------------------------------  Chemistries  Recent Labs  Lab 08/11/18 1251   08/14/18 1102  NA 143   < > 145  K 3.6   < > 4.2  CL 108   < > 113*  CO2 30   < > 27  GLUCOSE 129*   < > 173*  BUN 24*   < > 15  CREATININE 0.82   < > 0.69  CALCIUM 9.0   < > 8.8*  AST 14*  --   --   ALT 8  --   --   ALKPHOS 70  --   --   BILITOT 0.4  --   --    < > = values in this interval not displayed.   ------------------------------------------------------------------------------------------------------------------  Cardiac Enzymes Recent Labs  Lab 08/12/18 0441  TROPONINI 0.03*   ------------------------------------------------------------------------------------------------------------------  RADIOLOGY:  No results found.   ASSESSMENT AND PLAN:   84 year old female with past medical history of diabetes, hypertension, GERD, hyperlipidemia, anxiety/depression, neuropathy who presented to the hospital due to weakness, cough and altered mental status and noted to have pneumonia.  1.  Altered mental status- metabolic encephalopathy secondary to mild hypercapnia and underlying pneumonia.   - Patient improved with BiPAP and was transferred back to the floor but overnight got quite agitated and received 2 doses of IV Ativan.  Now encephalopathic again.  ABG this morning showing no hypercapnia. - I will discontinue the Ativan, continue PRN Haldol for agitation.  If not improving consider doing a repeat CT head but prior 2 CTs of the head have been negative.  2.  Pneumonia-this is the source of patient's weakness and shortness of breath. - Continue IV cefepime -Follow cultures which  are (-) so far, clinically afebrile and hemodynamically stable.  3.  COPD-mild acute exacerbation secondary to pneumonia. -Continue scheduled duo nebs, Pulmicort nebs.  4.  History of urinary incontinence-continue oxybutynin.  5.  Essential hypertension-continue Avapro  6. Anxiety/depresion -  off Depakote now due to its sedative effects and hypercapnia.    Prognosis is quite poor given  dementia, recurrent Hypercapnia.  Will get Palliative Care consult for goals of care.  Pt. Is DNR.    All the records are reviewed and case discussed with Care Management/Social Worker. Management plans discussed with the patient, family and they are in agreement.  CODE STATUS: DNR  DVT Prophylaxis: Lovenox  TOTAL TIME TAKING CARE OF THIS PATIENT: 30 minutes.   POSSIBLE D/C IN 1-2 DAYS, DEPENDING ON CLINICAL CONDITION.   Houston Siren M.D on 08/15/2018 at 3:41 PM  Between 7am to 6pm - Pager - [phone]  After 6pm go to [email or web address] - Scientist, research (life sciences) [facility]  Office  [phone]  CC: Primary care physician; Lauro Regulus, MD

## 2018-08-15 NOTE — Progress Notes (Signed)
Speech Language Pathology Treatment: Dysphagia  Patient Details Name: [name] MRN: [id number] DOB: [birth year] Today's Date: 08/15/2018 Time: 0930-1000 SLP Time Calculation (min) (ACUTE ONLY): 30 min  Assessment / Plan / Recommendation Clinical Impression  Pt seen for ongoing assessment of swallowing and toleration of diet - pt was placed on a dysphagia diet (puree foods) post eval. NSG had reported adequate toleration yesterday, however, today pt's Cognitive status is declined and d/t agitation, she required medication(Ativan? per family) which may be causing increased lethargy. Pt is currently drowsy/lethargic w/ mumbled speech requiring mod-max cues.  Pt was positioned upright and w/ positioning, her effortful breathing appeared to calm(less abdominal breathing than when she was lying on her back/side in the bed). Pt mumbled "yes" she wanted something to drink but did not respond consistently to tactile stim of boluses placed at her lips. She appeared to lick at them slightly but did not fully accept bolus material and no pharyngeal swallow was appreciated. No further trials were assessed d/t pt's lethargic state; declined Cognitive status.  Recommend modifying diet further d/t her baseline Dementia and less awake status currently to a Dysphagia 1(puree) w/ NECTAR consistency liquids via TSP/Cup monitoring all boluses. Pt MUST be fully awake, alert for being fed foods/liquids. Recommend strict aspiration precautions; Pills CRUSHED in puree as able; full feeding assistance at all meals.  ST services will f/u next 1-2 days for toleration of diet. Recommend consideration of a less sedating medication for pt's agitation d/t the impact of lethargy on her functional ability to safely participate in ADLs and eating/drinking meals to meet nutrition/hydration needs orally. NSG updated who will address w/ MD.    HPI HPI: Pt is a 84 y.o. female with a known history which includes Dementia, GERD,  vertigo, hip fx w/ repair, anxiety/depression, DM, neuropathy presenting from nursing facility with altered mental status, found to be hypoxic on presentation to the emergency room, patient had ? Flu a week ago, had not been feeling well during this period of time, had cough, in the emergency room patient was unresponsive, patient was found to have acute hypoxic hypercarbic respiratory failure on blood gas-started on BiPAP, CT head noted for extensive sinus disease. CT of the chest noted for Bronchitic changes with scattered Atelectasis in the lower lobes, right-sided pneumonia, marked enlargement of the Left thyroid lobe. Patient admitted for acute toxic metabolic encephalopathy due to acute pneumonia and polypharmacy.  Today, pt is alert, talkative w/ declined Cognition noted during verbal engagement. Pt follows instruction given Mod verbal/tactile cues; easily distracted also.  Noted Edentulous at baseline.       SLP Plan  Continue with current plan of care       Recommendations  Diet recommendations: Dysphagia 1 (puree);Nectar-thick liquid Liquids provided via: Teaspoon;Cup(monitor any straw use) Medication Administration: Crushed with puree(as able) Supervision: Full supervision/cueing for compensatory strategies;Trained caregiver to feed patient Compensations: Minimize environmental distractions;Slow rate;Small sips/bites;Lingual sweep for clearance of pocketing;Multiple dry swallows after each bite/sip;Follow solids with liquid Postural Changes and/or Swallow Maneuvers: Seated upright 90 degrees;Upright 30-60 min after meal                General recommendations: (Dietician f/u) Oral Care Recommendations: Oral care BID;Staff/trained caregiver to provide oral care Follow up Recommendations: Skilled Nursing facility(TBD) SLP Visit Diagnosis: Dysphagia, oropharyngeal phase (R13.12)(declined Cognitive status) Plan: Continue with current plan of care       GO  Jessica Som, MS, CCC-SLP [name] 08/15/2018, 10:36 AM

## 2018-08-15 NOTE — Progress Notes (Signed)
SWOT review for MEWS score of 4. Patient hypertensive and unable to take PO medication due to lethargy. Patient required PRN ativan and haldol over night for agitation. MD notified of patients current condition new orders for clonidine patch, IVF, and to d/c Ativan. MD in to assess patient for further treatment.

## 2018-08-15 NOTE — Consult Note (Signed)
Consultation Note Date: 08/15/2018   Patient Name: [name]  DOB: 1932-07-12  MRN: [id number]  Age / Sex: 84 y.o., female  PCP: Lauro Regulus, MD Referring Physician: Houston Siren, MD  Reason for Consultation: Establishing goals of care  HPI/Patient Profile:  [name]  is an 84 y.o. female with a known history of dementia, presenting from nursing facility with altered mental status, found to be hypoxic on presentation to the emergency room. In the emergency room patient was unresponsive, patient was found to have acute hypoxic hypercarbic respiratory failure on blood gas-started on BiPAP.CT of the chest noted for right-sided pneumonia. Patient with confused.     Clinical Assessment and Goals of Care: Patient is resting in bed with eyes closed initially. RT in to give neb tx. Patient began yelling out "please help". She was unable to voice what her need was. She was unable to answer questions.   Son [name] to bedside. He states his mother is widowed and he is her only child. She is retired from the [employer]. He states she has been living in facilities for around 3 years.   He states she has some forgetfulness, but is able to converse. Attempted to discuss the extent of her confusion, but he states she is 84 years old and does not describe the confusion further.  He states that the last time she was sick and in the hospital, she had delirium and was talking with her sister who had been dead for over 10 years.   At baseline she uses a walker. He states she has shown decline because she is not doing for herself any longer. Per paperwork from facility she requires assistance with bathing, feeding, and dressing. She is incontinent of bladder and bowel. He states she loves eating big macks and states if she came to a point of not being able to eat them, she probably would not want to go on any  longer.   [name] states he does not believe he is the decision maker for his mother. He states that a woman named Babs Sciara who was her postal carrier in [place] was made her medical POA. He does not know her last name or any contact information. He states she mentioned changing the paperwork but does not know if she did. He states he went to check on her one day and she had moved out of her home and to a facility. Spoke with Pam at [facility], she states she will have her administrators look into this.         SUMMARY OF RECOMMENDATIONS   Recommend EKG to assess QTC. If QTC is not prolonged, recommend initiating Risperdal 0.5mg  BID for agitation.   Will have to determine medical decision maker before further discussions on GOC.    Code Status/Advance Care Planning:  DNR  Prognosis:   Unable to determine  Discharge Planning: To Be Determined      Primary Diagnoses: Present on Admission: . Encephalopathy acute   I have reviewed the  medical record, interviewed the patient and family, and examined the patient. The following aspects are pertinent.  Past Medical History:  Diagnosis Date  . Anxiety   . Depression   . Diabetes mellitus without complication (HCC)   . GERD (gastroesophageal reflux disease)   . Hip fracture (HCC)   . Hyperlipemia   . Neuropathy   . Vertigo    Social History   Socioeconomic History  . Marital status: Widowed    Spouse name: Not on file  . Number of children: Not on file  . Years of education: Not on file  . Highest education level: Not on file  Occupational History  . Occupation: Retired  Engineer, production  . Financial resource strain: Not on file  . Food insecurity:    Worry: Not on file    Inability: Not on file  . Transportation needs:    Medical: Not on file    Non-medical: Not on file  Tobacco Use  . Smoking status: Never Smoker  . Smokeless tobacco: Never Used  Substance and Sexual Activity  . Alcohol use: No    Alcohol/week: 0.0  standard drinks  . Drug use: No  . Sexual activity: Not on file  Lifestyle  . Physical activity:    Days per week: Not on file    Minutes per session: Not on file  . Stress: Not on file  Relationships  . Social connections:    Talks on phone: Not on file    Gets together: Not on file    Attends religious service: Not on file    Active member of club or organization: Not on file    Attends meetings of clubs or organizations: Not on file    Relationship status: Not on file  Other Topics Concern  . Not on file  Social History Narrative  . Not on file   Family History  Problem Relation Age of Onset  . Diabetes Mother   . Diabetes Sister        x2  . Breast cancer Sister   . Heart disease Father   . Heart disease Brother        x3  . Bladder Cancer Neg Hx   . Kidney cancer Neg Hx   . Prostate cancer Neg Hx    Scheduled Meds: . aspirin EC  81 mg Oral Daily  . budesonide (PULMICORT) nebulizer solution  0.5 mg Nebulization BID  . cloNIDine  0.1 mg Transdermal Weekly  . enoxaparin (LOVENOX) injection  40 mg Subcutaneous Q24H  . famotidine  20 mg Oral Daily  . feeding supplement (ENSURE ENLIVE)  237 mL Oral TID BM  . insulin aspart  0-15 Units Subcutaneous TID WC  . insulin aspart  0-5 Units Subcutaneous QHS  . ipratropium-albuterol  3 mL Nebulization TID  . irbesartan  75 mg Oral Daily  . ketotifen  1 drop Both Eyes BID  . mouth rinse  15 mL Mouth Rinse BID  . multivitamin with minerals  1 tablet Oral Daily  . olopatadine  1 drop Both Eyes Daily  . oxybutynin  5 mg Oral BID   Continuous Infusions: . sodium chloride Stopped (08/14/18 1646)  . sodium chloride Stopped (08/15/18 1012)  . ceFEPime (MAXIPIME) IV 2 g (08/15/18 1012)   PRN Meds:.sodium chloride, acetaminophen, calcium carbonate, haloperidol lactate, hydrALAZINE, ondansetron (ZOFRAN) IV Medications Prior to Admission:  Prior to Admission medications   Medication Sig Start Date End Date Taking? Authorizing  Provider  acetaminophen (TYLENOL) 325  MG tablet Take 650 mg by mouth every 4 (four) hours as needed for mild pain or moderate pain.    Yes [provider]  aspirin EC 81 MG tablet Take 81 mg by mouth daily.   Yes [provider]  busPIRone (BUSPAR) 10 MG tablet Take 10 mg by mouth 2 (two) times daily.   Yes [provider]  cetirizine (ZYRTEC) 10 MG tablet Take 10 mg by mouth daily.   Yes [provider]  Cholecalciferol 1000 units tablet Take 2,000 Units by mouth daily.    Yes [provider]  citalopram (CELEXA) 20 MG tablet Take 20 mg by mouth daily.    Yes [provider]  dicyclomine (BENTYL) 20 MG tablet Take 20 mg by mouth 4 (four) times daily.   Yes [provider]  diphenhydrAMINE (BENADRYL) 2 % cream Apply 1 application topically 2 (two) times daily.    Yes [provider]  divalproex (DEPAKOTE ER) 250 MG 24 hr tablet Take 250 mg by mouth 3 (three) times daily.   Yes [provider]  esomeprazole (NEXIUM) 20 MG capsule Take 20 mg by mouth daily at 12 noon.   Yes [provider]  ferrous sulfate 325 (65 FE) MG tablet Take 325 mg by mouth 2 (two) times daily.   Yes [provider]  fluticasone (FLONASE) 50 MCG/ACT nasal spray Place 1 spray into both nostrils daily.    Yes [provider]  gabapentin (NEURONTIN) 300 MG capsule Take 300 mg by mouth 2 (two) times daily.    Yes [provider]  glipiZIDE (GLUCOTROL) 5 MG tablet Take 5 mg by mouth 2 (two) times daily.    Yes [provider]  ketotifen (ZADITOR) 0.025 % ophthalmic solution Place 1 drop into both eyes 2 (two) times daily.    Yes [provider]  Lactobacillus Rhamnosus, GG, (CULTURELLE) CAPS Take 1 capsule by mouth daily.   Yes [provider]  loperamide (IMODIUM) 2 MG capsule Take 2 mg by mouth daily as needed for diarrhea or loose stools.    Yes [provider]  LORazepam  (ATIVAN) 0.5 MG tablet Take 0.5 mg by mouth 2 (two) times daily as needed for anxiety.   Yes [provider]  olopatadine (PATANOL) 0.1 % ophthalmic solution Place 1 drop into both eyes daily.   Yes [provider]  ondansetron (ZOFRAN) 4 MG tablet Take 4 mg by mouth every 8 (eight) hours as needed for nausea or vomiting.   Yes [provider]  oxybutynin (DITROPAN) 5 MG tablet TAKE 1 TABLET BY MOUTH TWICE A DAY Patient taking differently: Take 5 mg by mouth 2 (two) times daily.  05/06/16  Yes Defrancesco, Prentice Docker, MD  QUEtiapine (SEROQUEL) 25 MG tablet Take 25 mg by mouth at bedtime.   Yes [provider]  telmisartan (MICARDIS) 40 MG tablet Take 40 mg by mouth daily.    Yes [provider]   Allergies  Allergen Reactions  . Ampicillin     Zpac  . Sulfa Antibiotics    Review of Systems  Unable to perform ROS   Physical Exam  Constitutional: She appears well-developed.  agitated  Pulmonary/Chest: Effort normal.  Neurological: She is alert.  Skin: Skin is warm and dry.    Vital Signs: BP (!) 186/92   Pulse (!) 115   Temp 98.1 F (36.7 C) (Axillary)   Resp (!) 32   Ht 5\' 4"  (1.626 m)   Wt  84.3 kg   SpO2 96%   BMI 31.90 kg/m  Pain Scale: FLACC POSS *See Group Information*: 2-Acceptable,Slightly drowsy, easily aroused Pain Score: 0-No pain   SpO2: SpO2: 96 % O2 Device:SpO2: 96 % O2 Flow Rate: .O2 Flow Rate (L/min): 4 L/min  IO: Intake/output summary:   Intake/Output Summary (Last 24 hours) at 08/15/2018 1441 Last data filed at 08/15/2018 1012 Gross per 24 hour  Intake 129.66 ml  Output 1100 ml  Net -970.34 ml    LBM: Last BM Date: 08/13/18 Baseline Weight: Weight: 84 kg Most recent weight: Weight: 84.3 kg     Palliative Assessment/Data:     Time In: 3:10 Time Out: 4:00 Time Total: 50 min Greater than 50%  of this time was spent counseling and coordinating care related to the above assessment and plan.  Signed  by: Morton Stall, NP   Please contact Palliative Medicine Team phone at [phone] for questions and concerns.  For individual provider: See Loretha Stapler

## 2018-08-16 DIAGNOSIS — A4189 Other specified sepsis: Secondary | ICD-10-CM

## 2018-08-16 DIAGNOSIS — F03918 Unspecified dementia, unspecified severity, with other behavioral disturbance: Secondary | ICD-10-CM

## 2018-08-16 DIAGNOSIS — R05 Cough: Secondary | ICD-10-CM

## 2018-08-16 DIAGNOSIS — R4182 Altered mental status, unspecified: Secondary | ICD-10-CM

## 2018-08-16 DIAGNOSIS — F0391 Unspecified dementia with behavioral disturbance: Secondary | ICD-10-CM

## 2018-08-16 DIAGNOSIS — R410 Disorientation, unspecified: Secondary | ICD-10-CM

## 2018-08-16 DIAGNOSIS — R531 Weakness: Secondary | ICD-10-CM

## 2018-08-16 LAB — CULTURE, BLOOD (ROUTINE X 2)
CULTURE: NO GROWTH
Culture: NO GROWTH
Special Requests: ADEQUATE

## 2018-08-16 LAB — CBC
HCT: 37.6 % (ref 36.0–46.0)
HEMOGLOBIN: 11.3 g/dL — AB (ref 12.0–15.0)
MCH: 25.3 pg — AB (ref 26.0–34.0)
MCHC: 30.1 g/dL (ref 30.0–36.0)
MCV: 84.1 fL (ref 80.0–100.0)
NRBC: 0 % (ref 0.0–0.2)
PLATELETS: 273 10*3/uL (ref 150–400)
RBC: 4.47 MIL/uL (ref 3.87–5.11)
RDW: 13.3 % (ref 11.5–15.5)
WBC: 13.1 10*3/uL — ABNORMAL HIGH (ref 4.0–10.5)

## 2018-08-16 LAB — BASIC METABOLIC PANEL
ANION GAP: 11 (ref 5–15)
BUN: 17 mg/dL (ref 8–23)
CO2: 27 mmol/L (ref 22–32)
Calcium: 9.1 mg/dL (ref 8.9–10.3)
Chloride: 105 mmol/L (ref 98–111)
Creatinine, Ser: 0.48 mg/dL (ref 0.44–1.00)
GFR calc Af Amer: >60 mL/min (ref >60)
GLUCOSE: 149 mg/dL — AB (ref 70–99)
POTASSIUM: 3.5 mmol/L (ref 3.5–5.1)
Sodium: 143 mmol/L (ref 135–145)

## 2018-08-16 LAB — GLUCOSE, CAPILLARY
GLUCOSE-CAPILLARY: 154 mg/dL — AB (ref 70–99)
GLUCOSE-CAPILLARY: 115 mg/dL — AB (ref 70–99)
Glucose-Capillary: 140 mg/dL — ABNORMAL HIGH (ref 70–99)
Glucose-Capillary: 135 mg/dL — ABNORMAL HIGH (ref 70–99)

## 2018-08-16 MED ORDER — LORAZEPAM 2 MG/ML IJ SOLN
1.0000 mg | Freq: Once | INTRAMUSCULAR | Status: AC
  Administered 2018-08-16: 1 mg via INTRAVENOUS
  Filled 2018-08-16: qty 1

## 2018-08-16 MED ORDER — RISPERIDONE 0.5 MG PO TBDP
0.2500 mg | ORAL_TABLET | Freq: Every day | ORAL | Status: DC
  Filled 2018-08-16 (×2): qty 0.5

## 2018-08-16 MED ORDER — LORAZEPAM 2 MG/ML IJ SOLN
1.0000 mg | INTRAMUSCULAR | Status: DC | PRN
  Administered 2018-08-16: 1 mg via INTRAVENOUS
  Filled 2018-08-16: qty 1

## 2018-08-16 MED ORDER — QUETIAPINE FUMARATE 25 MG PO TABS
25.0000 mg | ORAL_TABLET | Freq: Every day | ORAL | Status: DC
  Administered 2018-08-16 – 2018-08-18 (×3): 25 mg via ORAL
  Filled 2018-08-16 (×3): qty 1

## 2018-08-16 MED ORDER — METOPROLOL TARTRATE 25 MG PO TABS
12.5000 mg | ORAL_TABLET | Freq: Two times a day (BID) | ORAL | Status: DC
  Administered 2018-08-16 – 2018-08-22 (×12): 12.5 mg via ORAL
  Filled 2018-08-16 (×14): qty 1

## 2018-08-16 MED ORDER — MORPHINE SULFATE (PF) 2 MG/ML IV SOLN
1.0000 mg | Freq: Once | INTRAVENOUS | Status: AC
  Administered 2018-08-16: 14:00:00 1 mg via INTRAVENOUS
  Filled 2018-08-16: qty 1

## 2018-08-16 MED ORDER — CLONIDINE HCL 0.2 MG/24HR TD PTWK
0.2000 mg | MEDICATED_PATCH | TRANSDERMAL | Status: DC
  Administered 2018-08-16: 13:00:00 0.2 mg via TRANSDERMAL
  Filled 2018-08-16: qty 1

## 2018-08-16 MED ORDER — RISPERIDONE 0.5 MG PO TBDP
0.2500 mg | ORAL_TABLET | Freq: Two times a day (BID) | ORAL | Status: DC
  Administered 2018-08-16: 15:00:00 0.25 mg via ORAL
  Filled 2018-08-16 (×3): qty 0.5

## 2018-08-16 MED ORDER — HALOPERIDOL LACTATE 5 MG/ML IJ SOLN
2.5000 mg | Freq: Once | INTRAMUSCULAR | Status: AC
  Administered 2018-08-16: 2.5 mg via INTRAVENOUS

## 2018-08-16 NOTE — Care Management (Signed)
Barrier- continued delirium and agitation requiring IV Haldol which has affected her EKG.  Dose IV Ativan. Medication changes- dc Depakote- add risperdal. psych is assisting with medications to control delirium and palliative is following. IV antibiotics for pneumonia

## 2018-08-16 NOTE — Consult Note (Signed)
[facility] Consult   Reason for Consult: Consult for this 84 year old woman who is in the hospital with pneumonia who continues to show signs of delirium Referring Physician: [place] Patient Identification: [name] MRN:  [id number] Principal Diagnosis: Acute delirium Diagnosis:   Patient Active Problem List   Diagnosis Date Noted  . Acute delirium [R41.0] 08/16/2018  . Dementia with behavioral disturbance ([place]) [F03.91] 08/16/2018  . Encephalopathy acute [G93.40] 08/11/2018  . Sepsis ([place]) [A41.9] 05/21/2017  . Aspiration pneumonia ([place]) [J69.0] 05/21/2017  . GERD (gastroesophageal reflux disease) [K21.9] 05/21/2017  . HLD (hyperlipidemia) [E78.5] 05/21/2017  . Depression [F32.9] 05/21/2017  . Anxiety [F41.9] 05/21/2017  . Elevated troponin [R79.89] 05/21/2017  . Diabetes ([place]) [E11.9] 05/21/2017  . Right humeral fracture [S42.301A] 05/21/2017  . Nausea and vomiting [R11.2] 03/31/2015  . Microcytic anemia [D50.9] 03/31/2015    Total Time spent with patient: 1 hour  Subjective:   [name] is a 84 y.o. female patient admitted with patient not able to give information.  HPI: Patient seen chart reviewed.  Reviewed notes from this admission as well as previous emergency room and hospital visits.  Spoke with nursing and hospitalist.  Evidence in the chart shows that as little as a month or so ago the patient was able to hold enough of a conversation to give an appropriate medical history.  Family reports that she was able to have conversation and to move around her room at her living facility.  Presented to the hospital with mental status changes.  Probably hypoxic and speculation about contribution of polypharmacy.  Patient had been taken off of several medications that could have been sedating and her medical problems have been treated.  She continues to be intermittently agitated and delirious.  Puts herself at risk by trying to crawl out of bed.  Not able to  understand current situation or safe behavior.  Has had to have several doses of sedating medications for safety.  Patient is currently from what I can see not on any standing psychiatric medicine.  At the time of admission it looks like she was taking BuSpar 10 mg twice a day, Celexa 20 mg a day, Depakote 250 mg 3 times a day and Seroquel 25 mg at night and that there was also an order for as needed benzodiazepines but the patient was reported is not taking those regularly.  Medicines were all things that had been in place for a while although there had been some dose adjustment.  Depakote level was checked and was low.  Had initially an elevated ammonia of unclear significance but that has been rechecked in its back down to normal.  It is documented in the chart that the patient has at least some degree of dementia although exactly what that degree is is a little unclear to me.  Diagnoses of anxiety are mentioned although I do not see any hold mental health notes.  Social history: Son apparently is the most involved in care and has been present here in the hospital although from what I see in the chart is a little confusing about whether there may be someone else who is the power of attorney.  She is currently residing at a living facility.  She is DNR.  They have tried to have palliative care see her but they were unable to communicate because of her mental status.  Medical history: In the hospital now with respiratory infection.  History of elevated lipids reflux fairly recent hip fracture  Substance abuse history: None  Past Psychiatric History: As noted there are diagnoses of anxiety and depression listed in the chart but no previous psychiatric notes and no evidence of psychiatric hospitalization.  It looks like these were being managed over the long-term by her primary care doctor with modest doses of antidepressants and BuSpar.  Risk to Self:   Risk to Others:   Prior Inpatient Therapy:   Prior  Outpatient Therapy:    Past Medical History:  Past Medical History:  Diagnosis Date  . Anxiety   . Depression   . Diabetes mellitus without complication ([place])   . GERD (gastroesophageal reflux disease)   . Hip fracture ([place])   . Hyperlipemia   . Neuropathy   . Vertigo     Past Surgical History:  Procedure Laterality Date  . ABDOMINAL HYSTERECTOMY    . APPENDECTOMY    . BACK SURGERY     tumor removal bengin  . CHOLECYSTECTOMY     Family History:  Family History  Problem Relation Age of Onset  . Diabetes Mother   . Diabetes Sister        x2  . Breast cancer Sister   . Heart disease Father   . Heart disease Brother        x3  . Bladder Cancer Neg Hx   . Kidney cancer Neg Hx   . Prostate cancer Neg Hx    Family Psychiatric  History: None known Social History:  Social History   Substance and Sexual Activity  Alcohol Use No  . Alcohol/week: 0.0 standard drinks     Social History   Substance and Sexual Activity  Drug Use No    Social History   Socioeconomic History  . Marital status: Widowed    Spouse name: Not on file  . Number of children: Not on file  . Years of education: Not on file  . Highest education level: Not on file  Occupational History  . Occupation: Retired  Scientific laboratory technician  . Financial resource strain: Not on file  . Food insecurity:    Worry: Not on file    Inability: Not on file  . Transportation needs:    Medical: Not on file    Non-medical: Not on file  Tobacco Use  . Smoking status: Never Smoker  . Smokeless tobacco: Never Used  Substance and Sexual Activity  . Alcohol use: No    Alcohol/week: 0.0 standard drinks  . Drug use: No  . Sexual activity: Not on file  Lifestyle  . Physical activity:    Days per week: Not on file    Minutes per session: Not on file  . Stress: Not on file  Relationships  . Social connections:    Talks on phone: Not on file    Gets together: Not on file    Attends religious service: Not on file     Active member of club or organization: Not on file    Attends meetings of clubs or organizations: Not on file    Relationship status: Not on file  Other Topics Concern  . Not on file  Social History Narrative  . Not on file   Additional Social History:    Allergies:   Allergies  Allergen Reactions  . Ampicillin     Zpac  . Sulfa Antibiotics     Labs:  Results for orders placed or performed during the hospital encounter of 08/11/18 (from the past 48 hour(s))  Glucose, capillary  Status: Abnormal   Collection Time: 08/14/18  3:37 PM  Result Value Ref Range   Glucose-Capillary 121 (H) 70 - 99 mg/dL  Glucose, capillary     Status: Abnormal   Collection Time: 08/14/18  5:42 PM  Result Value Ref Range   Glucose-Capillary 136 (H) 70 - 99 mg/dL  Glucose, capillary     Status: Abnormal   Collection Time: 08/14/18  7:55 PM  Result Value Ref Range   Glucose-Capillary 136 (H) 70 - 99 mg/dL  Glucose, capillary     Status: Abnormal   Collection Time: 08/15/18  7:34 AM  Result Value Ref Range   Glucose-Capillary 157 (H) 70 - 99 mg/dL  Blood gas, arterial     Status: Abnormal   Collection Time: 08/15/18 11:27 AM  Result Value Ref Range   FIO2 0.36    Delivery systems NASAL CANNULA    pH, Arterial 7.39 7.350 - 7.450   pCO2 arterial 48 32.0 - 48.0 mmHg   pO2, Arterial 75 (L) 83.0 - 108.0 mmHg   Bicarbonate 29.1 (H) 20.0 - 28.0 mmol/L   Acid-Base Excess 3.4 (H) 0.0 - 2.0 mmol/L   O2 Saturation 94.7 %   Patient temperature 37.0    Collection site LEFT RADIAL    Sample type ARTERIAL DRAW    Allens test (pass/fail) PASS PASS    Comment: Performed at [facility], [address] [place], [place] [address]  Glucose, capillary     Status: Abnormal   Collection Time: 08/15/18 11:46 AM  Result Value Ref Range   Glucose-Capillary 127 (H) 70 - 99 mg/dL  Ammonia     Status: None   Collection Time: 08/15/18 11:49 AM  Result Value Ref Range   Ammonia 19 9 - 35 umol/L     Comment: Performed at [facility], [address] [place], [place] [address]  Glucose, capillary     Status: Abnormal   Collection Time: 08/15/18  4:30 PM  Result Value Ref Range   Glucose-Capillary 112 (H) 70 - 99 mg/dL  Glucose, capillary     Status: Abnormal   Collection Time: 08/15/18  9:35 PM  Result Value Ref Range   Glucose-Capillary 287 (H) 70 - 99 mg/dL  CBC     Status: Abnormal   Collection Time: 08/16/18  6:38 AM  Result Value Ref Range   WBC 13.1 (H) 4.0 - 10.5 K/uL   RBC 4.47 3.87 - 5.11 MIL/uL   Hemoglobin 11.3 (L) 12.0 - 15.0 g/dL   HCT 37.6 36.0 - 46.0 %   MCV 84.1 80.0 - 100.0 fL   MCH 25.3 (L) 26.0 - 34.0 pg   MCHC 30.1 30.0 - 36.0 g/dL   RDW 13.3 11.5 - 15.5 %   Platelets 273 150 - 400 K/uL   nRBC 0.0 0.0 - 0.2 %    Comment: Performed at [facility], [address] [place], [place] [address]  Basic metabolic panel     Status: Abnormal   Collection Time: 08/16/18  6:38 AM  Result Value Ref Range   Sodium 143 135 - 145 mmol/L   Potassium 3.5 3.5 - 5.1 mmol/L   Chloride 105 98 - 111 mmol/L   CO2 27 22 - 32 mmol/L   Glucose, Bld 149 (H) 70 - 99 mg/dL   BUN 17 8 - 23 mg/dL   Creatinine, Ser 0.48 0.44 - 1.00 mg/dL   Calcium 9.1 8.9 - 10.3 mg/dL   GFR calc non Af Amer >  60 >60 mL/min   GFR calc Af Amer >60 >60 mL/min    Comment: (NOTE) The eGFR has been calculated using the CKD EPI equation. This calculation has not been validated in all clinical situations. eGFR's persistently <60 mL/min signify possible Chronic Kidney Disease.    Anion gap 11 5 - 15    Comment: Performed at [facility], [address] [place], [place] [address]  Glucose, capillary     Status: Abnormal   Collection Time: 08/16/18  7:48 AM  Result Value Ref Range   Glucose-Capillary 154 (H) 70 - 99 mg/dL  Glucose, capillary     Status: Abnormal   Collection Time: 08/16/18 11:50 AM  Result Value Ref Range   Glucose-Capillary 140 (H) 70 - 99 mg/dL     Current Facility-Administered Medications  Medication Dose Route Frequency Provider Last Rate Last Dose  . 0.9 %  sodium chloride infusion   Intravenous PRN Tukov-Yual, Arlyss Gandy, NP   Stopped at 08/14/18 1646  . acetaminophen (TYLENOL) tablet 650 mg  650 mg Oral Q6H PRN Lance Coon, MD   650 mg at 08/13/18 0127  . aspirin EC tablet 81 mg  81 mg Oral Daily Salary, Montell D, MD   81 mg at 08/16/18 0803  . budesonide (PULMICORT) nebulizer solution 0.5 mg  0.5 mg Nebulization BID Salary, Montell D, MD   0.5 mg at 08/16/18 0820  . calcium carbonate (TUMS - dosed in mg elemental calcium) chewable tablet 400 mg of elemental calcium  400 mg of elemental calcium Oral TID PRN Henreitta Leber, MD   400 mg of elemental calcium at 08/13/18 0014  . ceFEPIme (MAXIPIME) 2 g in sodium chloride 0.9 % 100 mL IVPB  2 g Intravenous Q12H Hallaji, Sheema M, RPH 200 mL/hr at 08/16/18 0802 2 g at 08/16/18 0802  . cloNIDine (CATAPRES - Dosed in mg/24 hr) patch 0.1 mg  0.1 mg Transdermal Weekly Henreitta Leber, MD   0.1 mg at 08/15/18 1048  . enoxaparin (LOVENOX) injection 40 mg  40 mg Subcutaneous Q24H Salary, Montell D, MD   40 mg at 08/15/18 2239  . famotidine (PEPCID) tablet 20 mg  20 mg Oral Daily Charlett Nose, RPH   20 mg at 08/16/18 0802  . feeding supplement (ENSURE ENLIVE) (ENSURE ENLIVE) liquid 237 mL  237 mL Oral TID BM Henreitta Leber, MD   237 mL at 08/16/18 0806  . haloperidol lactate (HALDOL) injection 2.5 mg  2.5 mg Intravenous Q6H PRN Henreitta Leber, MD   2.5 mg at 08/16/18 0747  . hydrALAZINE (APRESOLINE) injection 10 mg  10 mg Intravenous Q6H PRN Gladstone Lighter, MD   10 mg at 08/15/18 1502  . insulin aspart (novoLOG) injection 0-15 Units  0-15 Units Subcutaneous TID WC Gorden Harms, MD   3 Units at 08/16/18 0759  . insulin aspart (novoLOG) injection 0-5 Units  0-5 Units Subcutaneous QHS Loney Hering D, MD   3 Units at 08/15/18 2239  . ipratropium-albuterol (DUONEB) 0.5-2.5  (3) MG/3ML nebulizer solution 3 mL  3 mL Nebulization TID Henreitta Leber, MD   3 mL at 08/16/18 0820  . irbesartan (AVAPRO) tablet 75 mg  75 mg Oral Daily Salary, Montell D, MD   75 mg at 08/16/18 0802  . ketotifen (ZADITOR) 0.025 % ophthalmic solution 1 drop  1 drop Both Eyes BID Salary, Montell D, MD   1 drop at 08/15/18 2240  . MEDLINE mouth rinse  15 mL  Mouth Rinse BID Henreitta Leber, MD   15 mL at 08/16/18 0807  . metoprolol tartrate (LOPRESSOR) tablet 12.5 mg  12.5 mg Oral BID Harrie Foreman, MD   12.5 mg at 08/16/18 0748  . multivitamin with minerals tablet 1 tablet  1 tablet Oral Daily Henreitta Leber, MD   1 tablet at 08/16/18 0803  . olopatadine (PATANOL) 0.1 % ophthalmic solution 1 drop  1 drop Both Eyes Daily Salary, Montell D, MD   1 drop at 08/14/18 0908  . ondansetron (ZOFRAN) injection 4 mg  4 mg Intravenous Q6H PRN Lance Coon, MD   4 mg at 08/13/18 0128  . oxybutynin (DITROPAN) tablet 5 mg  5 mg Oral BID Salary, Montell D, MD   5 mg at 08/16/18 0802  . risperiDONE (RISPERDAL M-TABS) disintegrating tablet 0.25 mg  0.25 mg Oral QHS Samanatha Brammer, Madie Reno, MD        Musculoskeletal: Strength & Muscle Tone: decreased Gait & Station: unable to stand Patient leans: N/A  Psychiatric Specialty Exam: Physical Exam  Nursing note and vitals reviewed. Constitutional: She appears well-developed and well-nourished.  HENT:  Head: Normocephalic and atraumatic.  Eyes: Pupils are equal, round, and reactive to light. Conjunctivae are normal.  Neck: Normal range of motion.  Cardiovascular: Regular rhythm and normal heart sounds.  Respiratory: Effort normal. No respiratory distress.  GI: Soft.  Musculoskeletal: Normal range of motion.  Neurological: She is alert.  Skin: Skin is warm and dry.  Psychiatric:  Patient was asleep when I came to see her and there was no benefit and waking her up.  Reports from staff are that she has been agitated confused delirious and trying to crawl out  of bed within the last few hours    Review of Systems  Unable to perform ROS: Patient unresponsive    Blood pressure (!) 161/68, pulse (!) 105, temperature 99.3 F (37.4 C), temperature source Oral, resp. rate 17, height _0  (1.626 m), weight 84.3 kg, SpO2 96 %.Body mass index is 31.9 kg/m.  General Appearance: Casual  Eye Contact:  None  Speech:  Negative  Volume:  Decreased  Mood:  Negative  Affect:  Negative  Thought Process:  NA  Orientation:  Negative  Thought Content:  Negative  Suicidal Thoughts:  No  Homicidal Thoughts:  No  Memory:  Negative  Judgement:  Negative  Insight:  Negative  Psychomotor Activity:  Negative  Concentration:  Concentration: Negative  Recall:  Negative  Fund of Knowledge:  Negative  Language:  Negative  Akathisia:  Negative  Handed:  Right  AIMS (if indicated):     Assets:  Housing Social Support  ADL's:  Impaired  Cognition:  Impaired,  Moderate  Sleep:        Treatment Plan Summary: Daily contact with patient to assess and evaluate symptoms and progress in treatment, Medication management and Plan 84 year old woman currently with delirium on top of dementia.  Obviously there are almost innumerable reasons for a person to develop this degree of dementia including metabolic changes from her acute illness, disorientation from hospitalization, medications etc.  The goal would be to find some kind of medication regimen to control the most dangerous and disturbing behaviors associated with her delirium in order to allow for safe disposition without being over sedating.  Would hope that long-term these could be tapered or discontinued once she is back to baseline.  I agree with the discontinuation of the medicine she was on at admission.  Particularly the Depakote which is unlikely to have been adding anything.  In general it works better to try to manage this sort of delirium with antipsychotics rather than benzodiazepines although in an emergency  with safety there can be a role for benzodiazepines.  My recommendation is that we try to find very low doses of oral antipsychotics can be administered routinely so that those can be given at discharge.  Based on that I am ordering 0.25 mg Risperdal oral dissolving tablet twice a day.  This of course is always a matter of trial and error finding something that would be helpful without causing undue side effects or oversedation.  This can be titrated up or down as needed.  Risk of problematic side effects would be very low.  I will continue to try to follow up.  Disposition: No evidence of imminent risk to self or others at present.    Alethia Berthold, MD 08/16/2018 12:20 PM

## 2018-08-16 NOTE — Consult Note (Signed)
  Psychiatry: Spoke again to hospitalist.  Patient evidently woke up and was immediately agitated and impossible to communicate with.  At this point she has been given 3 doses of 2.5 mg each of haloperidol intravenously today.  Question about what we can do to help with agitation at this point.  I looked at her EKG and her QT corrected is already over 500.  7-1/2 mg of Haldol and half a day is already a large amount especially for someone this age and I would hesitate to add any more given the EKG findings.  I know she had some Ativan and reportedly got more delirious but I am going to give her a one-time order of 1 mg of intravenous Ativan for now.  I will try and check up on her again today.

## 2018-08-16 NOTE — Progress Notes (Addendum)
Daily Progress Note   Patient Name: [name]       Date: 08/16/2018 DOB: May 18, 1932  Age: 84 y.o. MRN#: [id number] Attending Physician: Houston Siren, MD Primary Care Physician: Lauro Regulus, MD Admit Date: 08/11/2018  Reason for Consultation/Follow-up: Establishing goals of care  Subjective:  Patient is resting in bed. She is agitated with a consult pending for psychiatry eval. Wife of son is at bedside. (Son stated yesterday they had been separated for years.) Poor oral intake.    Spoke with [facility] representative Vernona Rieger who states she spoke with Sharyn Creamer who also works for [employer]. She states she only sees patient's son Tanaysia Bhardwaj as patient's decision maker if she is unable.   Called [facility] representative Corky Crafts who works at the facility and worked at the facility when Ms. Mcanany moved in. She states when Ms. Minier initially moved there from her apartment, it was because the patient's son would leave her canned food, and there was concern for Ms. Krist living alone. The postal carrier was worried about her and helped her arrange moving into the facility. Tiffany states it was an "emergency move in situation". She states that the patient and son did not have a good relationship.  She states Ms. Kring was her own Management consultant, and the Paramedic was her Location manager if she was unable to make decisions until patient's son later became involved.  She states the Paramedic has not been involved since 2018, and Ms. Pals herself changed her contact person from the postal carrier to her son [name]. She states she is not aware of any legal paperwork, and they were not given any.   Case reviewed with Dr. Neale Burly. Son [name] is Management consultant  as the postal worker is not present and patient's current facility and previous facility has no contact information for her or legal papers including POA papers.   Called and attempted to speak with son [name] x2 today. Voicemail left for  [place] with team phone number.  3:50: [name] to bedside. He states he is a phlebotomist and sticks terminal patients. He states some of the patients he goes in to draw are already gone, so he sees patients that are ready to go. We discussed quality of life and what her wishes would be. He  tells me his mother recognized him yesterday and he believes she still has hope. He states he is "not ready to throw in the towel".  He tells me he believes she has time left and is not ready to stop treatment. He confirms DNR/DNI status. He would like to continue to treat the treatable. He states he will know when to make a definitive decision and he is not there yet.   MOST form completed for DNR, limited additional interventions, abx okay, IV fluids okay, feeding tube for a limited time- no permanent feeding tube.    Length of Stay: 5  Current Medications: Scheduled Meds:  . aspirin EC  81 mg Oral Daily  . budesonide (PULMICORT) nebulizer solution  0.5 mg Nebulization BID  . cloNIDine  0.2 mg Transdermal Weekly  . enoxaparin (LOVENOX) injection  40 mg Subcutaneous Q24H  . famotidine  20 mg Oral Daily  . feeding supplement (ENSURE ENLIVE)  237 mL Oral TID BM  . insulin aspart  0-15 Units Subcutaneous TID WC  . insulin aspart  0-5 Units Subcutaneous QHS  . ipratropium-albuterol  3 mL Nebulization TID  . irbesartan  75 mg Oral Daily  . ketotifen  1 drop Both Eyes BID  . LORazepam  1 mg Intravenous Once  . mouth rinse  15 mL Mouth Rinse BID  . metoprolol tartrate  12.5 mg Oral BID  . multivitamin with minerals  1 tablet Oral Daily  . olopatadine  1 drop Both Eyes Daily  . oxybutynin  5 mg Oral BID  . risperiDONE  0.25 mg Oral BID    Continuous Infusions: . sodium  chloride Stopped (08/14/18 1646)  . ceFEPime (MAXIPIME) IV 2 g (08/16/18 0802)    PRN Meds: sodium chloride, acetaminophen, calcium carbonate, haloperidol lactate, hydrALAZINE, ondansetron (ZOFRAN) IV  Physical Exam  Pulmonary/Chest: Effort normal.  Skin: Skin is warm and dry.  Psychiatric:  Agitated            Vital Signs: BP (!) 189/80   Pulse (!) 102   Temp 98.6 F (37 C)   Resp 20   Ht 5\' 4"  (1.626 m)   Wt 84.3 kg   SpO2 92%   BMI 31.90 kg/m  SpO2: SpO2: 92 % O2 Device: O2 Device: Nasal Cannula O2 Flow Rate: O2 Flow Rate (L/min): 3 L/min  Intake/output summary: No intake or output data in the 24 hours ending 08/16/18 1422 LBM: Last BM Date: 08/13/18 Baseline Weight: Weight: 84 kg Most recent weight: Weight: 84.3 kg       Palliative Assessment/Data:    Flowsheet Rows     Most Recent Value  Intake Tab  Referral Department  Hospitalist  Unit at Time of Referral  Med/Surg Unit  Palliative Care Primary Diagnosis  Neurology  Date Notified  08/15/18  Palliative Care Type  New Palliative care  Reason for referral  Clarify Goals of Care  Date of Admission  08/11/18  Date first seen by Palliative Care  08/15/18  # of days Palliative referral response time  0 Day(s)  # of days IP prior to Palliative referral  4  Clinical Assessment  Psychosocial & Spiritual Assessment  Palliative Care Outcomes      Patient Active Problem List   Diagnosis Date Noted  . Acute delirium 08/16/2018  . Dementia with behavioral disturbance (HCC) 08/16/2018  . Encephalopathy acute 08/11/2018  . Sepsis (HCC) 05/21/2017  . Aspiration pneumonia (HCC) 05/21/2017  . GERD (gastroesophageal reflux disease) 05/21/2017  . HLD (  hyperlipidemia) 05/21/2017  . Depression 05/21/2017  . Anxiety 05/21/2017  . Elevated troponin 05/21/2017  . Diabetes (HCC) 05/21/2017  . Right humeral fracture 05/21/2017  . Nausea and vomiting 03/31/2015  . Microcytic anemia 03/31/2015    Palliative Care  Assessment & Plan    Recommendations/Plan:  Psychiatry following for agitation.   No POA paperwork noted by current or last facility. [name] is listed as contact person. Attempted to call [name] to discuss GOC, VM left.   [name] wants to continue to treat the treatable.  DNR/DNI   Code Status:    Code Status Orders  (From admission, onward)         Start     Ordered   08/11/18 2202  Do not attempt resuscitation (DNR)  Continuous    Question Answer Comment  In the event of cardiac or respiratory ARREST Do not call a "code blue"   In the event of cardiac or respiratory ARREST Do not perform Intubation, CPR, defibrillation or ACLS   In the event of cardiac or respiratory ARREST Use medication by any route, position, wound care, and other measures to relive pain and suffering. May use oxygen, suction and manual treatment of airway obstruction as needed for comfort.   Comments Nurse may pronounce      08/11/18 2201        Code Status History    Date Active Date Inactive Code Status Order ID Comments User Context   05/21/2017 2338 05/24/2017 2208 DNR [id number]  Oralia Manis, MD Inpatient    Advance Directive Documentation     Most Recent Value  Type of Advance Directive  Out of facility DNR (pink MOST or yellow form)  Pre-existing out of facility DNR order (yellow form or pink MOST form)  Yellow form placed in chart (order not valid for inpatient use)  "MOST" Form in Place?  -       Prognosis:  Poor overall.   Discharge Planning:  To Be Determined    Thank you for allowing the Palliative Medicine Team to assist in the care of this patient.   Time In: 9:50 2:45 Time Out: 10:30    3:25       Total Time 80 min Prolonged Time Billed  yes      Greater than 50%  of this time was spent counseling and coordinating care related to the above assessment and plan.  Morton Stall, NP  Please contact Palliative Medicine Team phone at [phone] for questions and concerns.

## 2018-08-16 NOTE — Progress Notes (Signed)
Attempted to give medication this evening, but was unable too. D/t delirium patient spit meds back out and would not take anymore. Trudee Kuster

## 2018-08-16 NOTE — Progress Notes (Signed)
[facility] - [place] at [facility]   PATIENT NAME: [name]    MR#:  [id number]  DATE OF BIRTH:  03/17/1932  SUBJECTIVE:   Patient remains quite encephalopathic and lethargic and still moaning.  Patient's family is at bedside.  REVIEW OF SYSTEMS:    Review of Systems  Unable to perform ROS: Mental acuity   Nutrition: Dysphagia 1 Tolerating Diet: No Tolerating PT: Await Eval.   DRUG ALLERGIES:   Allergies  Allergen Reactions  . Ampicillin     Zpac  . Sulfa Antibiotics     VITALS:  Blood pressure (!) 189/80, pulse 85, temperature (!) 100.5 F (38.1 C), temperature source Oral, resp. rate 20, height 5\' 4"  (1.626 m), weight 84.3 kg, SpO2 (!) 88 %.  PHYSICAL EXAMINATION:   Physical Exam  GENERAL:  84 y.o.-year-old patient lying in bed lethargic/encephalopathic and moaning.  EYES: Pupils equal, round, reactive to light. No scleral icterus. Extraocular muscles intact.  HEENT: Head atraumatic, normocephalic. Oropharynx and nasopharynx clear.  Dry Oral Mucosa. NECK:  Supple, no jugular venous distention. No thyroid enlargement, no tenderness.  LUNGS: Poor Resp. effort, no wheezing, rales, rhonchi. No use of accessory muscles of respiration.  CARDIOVASCULAR: S1, S2 normal. No murmurs, rubs, or gallops.  ABDOMEN: Soft, nontender, nondistended. Bowel sounds present. No organomegaly or mass.  EXTREMITIES: No cyanosis, clubbing or edema b/l.    NEUROLOGIC: Encephalopathic/lethargic and moaning at times.  PSYCHIATRIC: difficult to assess due to encephalopathy.   SKIN: No obvious rash, lesion, or ulcer.    LABORATORY PANEL:   CBC Recent Labs  Lab 08/16/18 0638  WBC 13.1*  HGB 11.3*  HCT 37.6  PLT 273   ------------------------------------------------------------------------------------------------------------------  Chemistries  Recent Labs  Lab 08/11/18 1251  08/16/18 0638  NA 143   < > 143  K 3.6   < > 3.5  CL 108   < > 105  CO2 30   < > 27   GLUCOSE 129*   < > 149*  BUN 24*   < > 17  CREATININE 0.82   < > 0.48  CALCIUM 9.0   < > 9.1  AST 14*  --   --   ALT 8  --   --   ALKPHOS 70  --   --   BILITOT 0.4  --   --    < > = values in this interval not displayed.   ------------------------------------------------------------------------------------------------------------------  Cardiac Enzymes Recent Labs  Lab 08/12/18 0441  TROPONINI 0.03*   ------------------------------------------------------------------------------------------------------------------  RADIOLOGY:  No results found.   ASSESSMENT AND PLAN:   84 year old female with past medical history of diabetes, hypertension, GERD, hyperlipidemia, anxiety/depression, neuropathy who presented to the hospital due to weakness, cough and altered mental status and noted to have pneumonia.  1.  Altered mental status- this is likely combination of polypharmacy, underlying dementia, underlying pneumonia.  Patient also had some mild hypercapnia which has resolved now. - Patient continues to have significant sundowning agitation.  Patient keeps getting Ativan overnight for agitation.  A psychiatric consult obtained today, will discontinue Ativan for now. -Continue PRN Haldol for agitation, also Risperdal added for agitation scheduled. -Follow mental status.  Continue IV antibiotics for pneumonia for now.  Continue to hold patient's Depakote, Seroquel for now.  2.  Pneumonia-this is the source of patient's weakness and shortness of breath. - Continue IV cefepime -Follow cultures which are (-) so far  3.  COPD-mild acute exacerbation secondary to pneumonia. -Continue scheduled duo nebs,  Pulmicort nebs.  4.  History of urinary incontinence-continue oxybutynin.  5.  Essential hypertension-continue Avapro  6. Anxiety/depresion -  off Depakote now due to its sedative effects and hypercapnia.   - Psychiatric consult obtained for medical management.  Discussed plan of  care with patient's son over the phone.  Also discussed plan of care with psychiatrist over the phone.  > 50% of time spent in coordinating care with family and also specialists.   All the records are reviewed and case discussed with Care Management/Social Worker. Management plans discussed with the patient, family and they are in agreement.  CODE STATUS: DNR  DVT Prophylaxis: Lovenox  TOTAL TIME TAKING CARE OF THIS PATIENT: 35 minutes.   POSSIBLE D/C DAYS, DEPENDING ON CLINICAL CONDITION.   Houston Siren M.D on 08/16/2018 at 4:30 PM  Between 7am to 6pm - Pager - [phone]  After 6pm go to [email or web address] - Scientist, research (life sciences) [place] [facility]  Office  [phone]  CC: Primary care physician; Lauro Regulus, MD

## 2018-08-16 NOTE — Progress Notes (Signed)
Clinical Child psychotherapist (CSW) contacted [employer] and made her aware that patient will need a DYS 1 nectar thick liquid diet and is on oxygen. Per Vernona Rieger patient is on room air at baseline and walks with a walker. Per Vernona Rieger she will coordinator with CSW when patient is stable for D/C.  [employer], LCSW [phone]

## 2018-08-17 ENCOUNTER — Inpatient Hospital Stay: Payer: Medicare Other

## 2018-08-17 DIAGNOSIS — R410 Disorientation, unspecified: Secondary | ICD-10-CM

## 2018-08-17 LAB — BASIC METABOLIC PANEL
ANION GAP: 10 (ref 5–15)
BUN: 16 mg/dL (ref 8–23)
CALCIUM: 9 mg/dL (ref 8.9–10.3)
CO2: 27 mmol/L (ref 22–32)
Chloride: 107 mmol/L (ref 98–111)
Creatinine, Ser: 0.52 mg/dL (ref 0.44–1.00)
GFR calc Af Amer: >60 mL/min (ref >60)
GFR calc non Af Amer: >60 mL/min (ref >60)
GLUCOSE: 149 mg/dL — AB (ref 70–99)
Potassium: 3.1 mmol/L — ABNORMAL LOW (ref 3.5–5.1)
Sodium: 144 mmol/L (ref 135–145)

## 2018-08-17 LAB — GLUCOSE, CAPILLARY
Glucose-Capillary: 134 mg/dL — ABNORMAL HIGH (ref 70–99)
Glucose-Capillary: 131 mg/dL — ABNORMAL HIGH (ref 70–99)
Glucose-Capillary: 172 mg/dL — ABNORMAL HIGH (ref 70–99)
Glucose-Capillary: 162 mg/dL — ABNORMAL HIGH (ref 70–99)

## 2018-08-17 MED ORDER — MORPHINE SULFATE (PF) 2 MG/ML IV SOLN
2.0000 mg | Freq: Once | INTRAVENOUS | Status: AC
  Administered 2018-08-17: 2 mg via INTRAVENOUS
  Filled 2018-08-17: qty 1

## 2018-08-17 MED ORDER — QUETIAPINE FUMARATE 25 MG PO TABS
25.0000 mg | ORAL_TABLET | Freq: Once | ORAL | Status: AC
  Administered 2018-08-17: 05:00:00 25 mg via ORAL
  Filled 2018-08-17: qty 1

## 2018-08-17 MED ORDER — HALOPERIDOL LACTATE 5 MG/ML IJ SOLN
0.5000 mg | Freq: Four times a day (QID) | INTRAMUSCULAR | Status: DC
  Administered 2018-08-17 – 2018-08-19 (×8): 0.5 mg via INTRAVENOUS
  Filled 2018-08-17 (×8): qty 1

## 2018-08-17 MED ORDER — LABETALOL HCL 5 MG/ML IV SOLN
10.0000 mg | Freq: Once | INTRAVENOUS | Status: AC
  Administered 2018-08-17: 16:00:00 10 mg via INTRAVENOUS
  Filled 2018-08-17: qty 4

## 2018-08-17 MED ORDER — RISPERIDONE 1 MG PO TBDP
0.5000 mg | ORAL_TABLET | Freq: Two times a day (BID) | ORAL | Status: DC
  Administered 2018-08-17 – 2018-08-19 (×4): 0.5 mg via ORAL
  Filled 2018-08-17 (×6): qty 0.5

## 2018-08-17 MED ORDER — LABETALOL HCL 5 MG/ML IV SOLN
10.0000 mg | Freq: Once | INTRAVENOUS | Status: AC
  Administered 2018-08-17: 10 mg via INTRAVENOUS
  Filled 2018-08-17: qty 4

## 2018-08-17 MED ORDER — POTASSIUM CHLORIDE 10 MEQ/100ML IV SOLN
10.0000 meq | INTRAVENOUS | Status: AC
  Administered 2018-08-17 (×2): 10 meq via INTRAVENOUS
  Filled 2018-08-17 (×3): qty 100

## 2018-08-17 NOTE — Progress Notes (Signed)
[facility] - [place] at [facility]   PATIENT NAME: [name]    MR#:  [id number]  DATE OF BIRTH:  1931/11/22  SUBJECTIVE:   Patient continues to be lethargic/encephalopathic.  Patient's blood pressure is somewhat elevated today despite getting pulse doses of IV labetalol and hydralazine.  Awaiting MRI of the brain to rule out intra-cranial pathology.  REVIEW OF SYSTEMS:    Review of Systems  Unable to perform ROS: Mental acuity   Nutrition: Dysphagia 1 Tolerating Diet: No Tolerating PT: Await Eval.   DRUG ALLERGIES:   Allergies  Allergen Reactions  . Ampicillin     Zpac  . Sulfa Antibiotics     VITALS:  Blood pressure (!) 190/84, pulse 94, temperature 97.7 F (36.5 C), temperature source Oral, resp. rate 20, height 5\' 4"  (1.626 m), weight 84.3 kg, SpO2 98 %.  PHYSICAL EXAMINATION:   Physical Exam  GENERAL:  84 y.o.-year-old patient lying in bed lethargic/encephalopathic and maybe follows some simple commands.  EYES: Pupils equal, round, reactive to light. No scleral icterus. Extraocular muscles intact.  HEENT: Head atraumatic, normocephalic. Oropharynx and nasopharynx clear.  Dry Oral Mucosa. NECK:  Supple, no jugular venous distention. No thyroid enlargement, no tenderness.  LUNGS: Poor Resp. effort, no wheezing, rales, rhonchi. No use of accessory muscles of respiration.  CARDIOVASCULAR: S1, S2 normal. No murmurs, rubs, or gallops.  ABDOMEN: Soft, nontender, nondistended. Bowel sounds present. No organomegaly or mass.  EXTREMITIES: No cyanosis, clubbing or edema b/l.    NEUROLOGIC: Encephalopathic/lethargic and moaning at times.  PSYCHIATRIC: difficult to assess due to encephalopathy.   SKIN: No obvious rash, lesion, or ulcer.    LABORATORY PANEL:   CBC Recent Labs  Lab 08/16/18 0638  WBC 13.1*  HGB 11.3*  HCT 37.6  PLT 273    ------------------------------------------------------------------------------------------------------------------  Chemistries  Recent Labs  Lab 08/11/18 1251  08/17/18 0521  NA 143   < > 144  K 3.6   < > 3.1*  CL 108   < > 107  CO2 30   < > 27  GLUCOSE 129*   < > 149*  BUN 24*   < > 16  CREATININE 0.82   < > 0.52  CALCIUM 9.0   < > 9.0  AST 14*  --   --   ALT 8  --   --   ALKPHOS 70  --   --   BILITOT 0.4  --   --    < > = values in this interval not displayed.   ------------------------------------------------------------------------------------------------------------------  Cardiac Enzymes Recent Labs  Lab 08/12/18 0441  TROPONINI 0.03*   ------------------------------------------------------------------------------------------------------------------  RADIOLOGY:  Dg Pelvis 1-2 Views  Result Date: 08/17/2018 CLINICAL DATA:  Evaluate for possible retained metallic foreign body EXAM: PELVIS - 1-2 VIEW COMPARISON:  None. FINDINGS: No acute bony abnormality is seen. No metallic foreign bodies are identified. IMPRESSION: No evidence of radiopaque foreign body. Electronically Signed   By: Alcide Clever M.D.   On: 08/17/2018 15:34   Dg Abd 1 View  Result Date: 08/17/2018 CLINICAL DATA:  Evaluate for possible retained foreign body EXAM: ABDOMEN - 1 VIEW COMPARISON:  None. FINDINGS: Changes consistent with prior cholecystectomy are noted. Nonobstructive bowel gas pattern is seen. Degenerative changes of lumbar spine are noted. No definitive radiopaque foreign body is seen. IMPRESSION: No radiopaque foreign body noted. Electronically Signed   By: Alcide Clever M.D.   On: 08/17/2018 15:35     ASSESSMENT AND PLAN:  84 year old female with past medical history of diabetes, hypertension, GERD, hyperlipidemia, anxiety/depression, neuropathy who presented to the hospital due to weakness, cough and altered mental status and noted to have pneumonia.  1.  Altered mental status-  this is likely combination of polypharmacy, underlying dementia, underlying pneumonia.  Patient also had some mild hypercapnia which has resolved now. - Patient received multiple doses of Ativan previously which led to her encephalopathy/confusion. -Seen by psychiatry started on small dose of Risperdal and also placed on as needed Haldol.  Mental status continues to be somewhat poor. - CT head 2 days ago was negative for acute pathology but I will get a MRI of the brain to rule out acute CVA.  Patient continues to be hypertensive possibly could be related to the underlying agitation but we need to rule out stroke  2.  Pneumonia-this was the source of patient's weakness and shortness of breath. - much improved and pt. Has finished 7 days of IV Cefepime.   3.  COPD-mild acute exacerbation secondary to pneumonia. -Continue scheduled duo nebs, Pulmicort nebs.  4.  History of urinary incontinence-continue oxybutynin.  5.  Accelerated HTN - BP continues to be labile.  -Continue clonidine patch, PRN IV hydralazine, labetalol.  Patient cannot take p.o. presently.  6. Anxiety/depresion -continue Seroquel at bedtime, Depakote has been discontinued, other antidepressants on hold presently as patient is too encephalopathic and lethargic. - Appreciate psychiatric consult and continue further care as per them.  Palliative Care following and pt. Is DNR.    All the records are reviewed and case discussed with Care Management/Social Worker. Management plans discussed with the patient, family and they are in agreement.  CODE STATUS: DNR  DVT Prophylaxis: Lovenox  TOTAL TIME TAKING CARE OF THIS PATIENT: 30 minutes.   POSSIBLE D/C unclear DAYS, DEPENDING ON CLINICAL CONDITION and progress.   Houston Siren M.D on 08/17/2018 at 3:55 PM  Between 7am to 6pm - Pager - [phone]  After 6pm go to [email or web address] - Scientist, research (life sciences) [place] [facility]  Office   [phone]  CC: Primary care physician; Lauro Regulus, MD

## 2018-08-17 NOTE — Progress Notes (Signed)
BP 193/76, HR 96 post labetalol. Dr Cherlynn Kaiser made aware.  MD placed an order for MRI of the head.

## 2018-08-17 NOTE — Progress Notes (Signed)
Initial Nutrition Assessment  DOCUMENTATION CODES:   Obesity unspecified  INTERVENTION:  Discontinued Ensure Enlive.  Provide Hormel Shake (Vital Cuisine) po TID with trays, each supplement provides 520 kcal and 22 grams of protein.  Provide Magic cup TID with meals, each supplement provides 290 kcal and 9 grams of protein.  If patient's PO intake does not improve by 10/14, recommend placement of small-bore feeding tube at that time for initiation of enteral nutrition.  NUTRITION DIAGNOSIS:   Inadequate oral intake related to lethargy/confusion as evidenced by meal completion < 50%.  Ongoing.  GOAL:   Patient will meet greater than or equal to 90% of their needs  Not progressing.  MONITOR:   PO intake, Supplement acceptance, Diet advancement, Labs, Weight trends, I & O's, Skin  REASON FOR ASSESSMENT:   Consult Assessment of nutrition requirement/status  ASSESSMENT:   84 year old female with PMHx of depression, anxiety, GERD, vertigo, DM, neuropathy who is admitted with AMS, PNA, mild acute exacerbation of COPD.   -On 10/8 diet was changed to dysphagia 1 with nectar-thick liquids.  Saw patient in room today. She continues to be confused. Had her eyes closed and was repeating the word "help." Per chart she is mainly finishing 0% of her trays. Patient appears to only have taken bites/sips of lunch today. Refusing many PO medications. PMT has been following patient/family. Per discussion yesterday short-term feeding tube is okay.  Medications reviewed and include: famotidine, Haldol 0.5 mg Q6hrs IV, Novolog 0-15 units TID, Novolog 0-5 units QHS, MVI daily, Seroquel 25 mg QHS, potassium chloride 10 mEq IV 4 times today.  Labs reviewed: CBG 115-154, Potassium 3.1.  Diet Order:   Diet Order            DIET - DYS 1 Room service appropriate? Yes with Assist; Fluid consistency: Nectar Thick  Diet effective now              EDUCATION NEEDS:   Not appropriate for  education at this time  Skin:  Skin Assessment: Skin Integrity Issues:(MSAD to ankle, breast, and groin)  Last BM:  08/12/2018 - small type 6  Height:   Ht Readings from Last 1 Encounters:  08/13/18 5\' 4"  (1.626 m)    Weight:   Wt Readings from Last 1 Encounters:  08/13/18 84.3 kg    Ideal Body Weight:  57.95 kg(calculated from height of 5' 5.5" from previous encounter)  BMI:  Body mass index is 31.9 kg/m.  Estimated Nutritional Needs:   Kcal:  1575-1830 (MSJ x 1.2-1.4)  Protein:  85-100 grams (1-1.2 grams/kg)  Fluid:  1.5 L/day (25 mL/kg IBW)  Helane Rima, MS, RD, LDN Office: [phone] Pager: [phone] After Hours/Weekend Pager: [phone]

## 2018-08-17 NOTE — Progress Notes (Signed)
MRI tech notified this writer that they are unable to reach family to answer questions prior to MRI.  MRI tech needs Xray of abd and pelvis 1x view for MRI clearance.  Notified Dr Cherlynn Kaiser of the above. Orders received.

## 2018-08-17 NOTE — Consult Note (Signed)
[place] Psychiatry Consult   Reason for Consult: Consult for 84 year old woman with delirium Referring Physician: Verdell Carmine Patient Identification: [name] MRN:  [id number] Principal Diagnosis: Acute delirium Diagnosis:   Patient Active Problem List   Diagnosis Date Noted  . Acute delirium [R41.0] 08/16/2018  . Dementia with behavioral disturbance ([place]) [F03.91] 08/16/2018  . Encephalopathy acute [G93.40] 08/11/2018  . Sepsis ([place]) [A41.9] 05/21/2017  . Aspiration pneumonia ([place]) [J69.0] 05/21/2017  . GERD (gastroesophageal reflux disease) [K21.9] 05/21/2017  . HLD (hyperlipidemia) [E78.5] 05/21/2017  . Depression [F32.9] 05/21/2017  . Anxiety [F41.9] 05/21/2017  . Elevated troponin [R79.89] 05/21/2017  . Diabetes ([place]) [E11.9] 05/21/2017  . Right humeral fracture [S42.301A] 05/21/2017  . Nausea and vomiting [R11.2] 03/31/2015  . Microcytic anemia [D50.9] 03/31/2015    Total Time spent with patient: 30 minutes  Subjective:   [name] is a 84 y.o. female patient admitted with "help".  HPI: Patient seen chart reviewed.  I came to see the patient this morning.  I found her in bed with her eyes mostly shut repeatedly saying the word "help" in a rhythmic fashion.  When I spoke to her she did open her eyes but made no sign of interacting with me directly.  When I spoke to her she did not change her speech pattern.  I asked her if she knew where she was right now and she could not tell me.  I asked her to tell me her name and explained that this would just let me know that we were communicating.  She was not able to do that but just continued to rhythmically say "help".  Patient's affect is flat.  No clear indication of any specific pain or injury.  Looking at her MAR sheets none of her oral medicines have been given today but presumably because of her inability to cooperate with swallowing pills.  Past Psychiatric History: Only history of established dementia although it  sounds like as recently as a month ago she had quality of life and some appropriate interaction.  No other serious psychiatric history identified  Risk to Self:   Risk to Others:   Prior Inpatient Therapy:   Prior Outpatient Therapy:    Past Medical History:  Past Medical History:  Diagnosis Date  . Anxiety   . Depression   . Diabetes mellitus without complication ([place])   . GERD (gastroesophageal reflux disease)   . Hip fracture ([place])   . Hyperlipemia   . Neuropathy   . Vertigo     Past Surgical History:  Procedure Laterality Date  . ABDOMINAL HYSTERECTOMY    . APPENDECTOMY    . BACK SURGERY     tumor removal bengin  . CHOLECYSTECTOMY     Family History:  Family History  Problem Relation Age of Onset  . Diabetes Mother   . Diabetes Sister        x2  . Breast cancer Sister   . Heart disease Father   . Heart disease Brother        x3  . Bladder Cancer Neg Hx   . Kidney cancer Neg Hx   . Prostate cancer Neg Hx    Family Psychiatric  History: Unknown Social History:  Social History   Substance and Sexual Activity  Alcohol Use No  . Alcohol/week: 0.0 standard drinks     Social History   Substance and Sexual Activity  Drug Use No    Social History   Socioeconomic History  . Marital  status: Widowed    Spouse name: Not on file  . Number of children: Not on file  . Years of education: Not on file  . Highest education level: Not on file  Occupational History  . Occupation: Retired  Scientific laboratory technician  . Financial resource strain: Not on file  . Food insecurity:    Worry: Not on file    Inability: Not on file  . Transportation needs:    Medical: Not on file    Non-medical: Not on file  Tobacco Use  . Smoking status: Never Smoker  . Smokeless tobacco: Never Used  Substance and Sexual Activity  . Alcohol use: No    Alcohol/week: 0.0 standard drinks  . Drug use: No  . Sexual activity: Not on file  Lifestyle  . Physical activity:    Days per week: Not on  file    Minutes per session: Not on file  . Stress: Not on file  Relationships  . Social connections:    Talks on phone: Not on file    Gets together: Not on file    Attends religious service: Not on file    Active member of club or organization: Not on file    Attends meetings of clubs or organizations: Not on file    Relationship status: Not on file  Other Topics Concern  . Not on file  Social History Narrative  . Not on file   Additional Social History:    Allergies:   Allergies  Allergen Reactions  . Ampicillin     Zpac  . Sulfa Antibiotics     Labs:  Results for orders placed or performed during the hospital encounter of 08/11/18 (from the past 48 hour(s))  Glucose, capillary     Status: Abnormal   Collection Time: 08/15/18 11:46 AM  Result Value Ref Range   Glucose-Capillary 127 (H) 70 - 99 mg/dL  Ammonia     Status: None   Collection Time: 08/15/18 11:49 AM  Result Value Ref Range   Ammonia 19 9 - 35 umol/L    Comment: Performed at [facility], [address] [place], [place] [address]  Glucose, capillary     Status: Abnormal   Collection Time: 08/15/18  4:30 PM  Result Value Ref Range   Glucose-Capillary 112 (H) 70 - 99 mg/dL  Glucose, capillary     Status: Abnormal   Collection Time: 08/15/18  9:35 PM  Result Value Ref Range   Glucose-Capillary 287 (H) 70 - 99 mg/dL  CBC     Status: Abnormal   Collection Time: 08/16/18  6:38 AM  Result Value Ref Range   WBC 13.1 (H) 4.0 - 10.5 K/uL   RBC 4.47 3.87 - 5.11 MIL/uL   Hemoglobin 11.3 (L) 12.0 - 15.0 g/dL   HCT 37.6 36.0 - 46.0 %   MCV 84.1 80.0 - 100.0 fL   MCH 25.3 (L) 26.0 - 34.0 pg   MCHC 30.1 30.0 - 36.0 g/dL   RDW 13.3 11.5 - 15.5 %   Platelets 273 150 - 400 K/uL   nRBC 0.0 0.0 - 0.2 %    Comment: Performed at [facility], [address], [place],  [address]  Basic metabolic panel     Status: Abnormal   Collection Time: 08/16/18  6:38 AM  Result Value Ref Range    Sodium 143 135 - 145 mmol/L   Potassium 3.5 3.5 - 5.1 mmol/L   Chloride 105 98 - 111 mmol/L  CO2 27 22 - 32 mmol/L   Glucose, Bld 149 (H) 70 - 99 mg/dL   BUN 17 8 - 23 mg/dL   Creatinine, Ser 0.48 0.44 - 1.00 mg/dL   Calcium 9.1 8.9 - 10.3 mg/dL   GFR calc non Af Amer >60 >60 mL/min   GFR calc Af Amer >60 >60 mL/min    Comment: (NOTE) The eGFR has been calculated using the CKD EPI equation. This calculation has not been validated in all clinical situations. eGFR's persistently <60 mL/min signify possible Chronic Kidney Disease.    Anion gap 11 5 - 15    Comment: Performed at [facility], [address] [place], [place] [address]  Glucose, capillary     Status: Abnormal   Collection Time: 08/16/18  7:48 AM  Result Value Ref Range   Glucose-Capillary 154 (H) 70 - 99 mg/dL  Glucose, capillary     Status: Abnormal   Collection Time: 08/16/18 11:50 AM  Result Value Ref Range   Glucose-Capillary 140 (H) 70 - 99 mg/dL  Glucose, capillary     Status: Abnormal   Collection Time: 08/16/18  4:48 PM  Result Value Ref Range   Glucose-Capillary 135 (H) 70 - 99 mg/dL  Glucose, capillary     Status: Abnormal   Collection Time: 08/16/18  8:02 PM  Result Value Ref Range   Glucose-Capillary 115 (H) 70 - 99 mg/dL  Basic metabolic panel     Status: Abnormal   Collection Time: 08/17/18  5:21 AM  Result Value Ref Range   Sodium 144 135 - 145 mmol/L   Potassium 3.1 (L) 3.5 - 5.1 mmol/L   Chloride 107 98 - 111 mmol/L   CO2 27 22 - 32 mmol/L   Glucose, Bld 149 (H) 70 - 99 mg/dL   BUN 16 8 - 23 mg/dL   Creatinine, Ser 0.52 0.44 - 1.00 mg/dL   Calcium 9.0 8.9 - 10.3 mg/dL   GFR calc non Af Amer >60 >60 mL/min   GFR calc Af Amer >60 >60 mL/min    Comment: (NOTE) The eGFR has been calculated using the CKD EPI equation. This calculation has not been validated in all clinical situations. eGFR's persistently <60 mL/min signify possible Chronic Kidney Disease.    Anion gap 10 5 - 15     Comment: Performed at [facility], [address] [place], [place] [address]  Glucose, capillary     Status: Abnormal   Collection Time: 08/17/18  7:37 AM  Result Value Ref Range   Glucose-Capillary 134 (H) 70 - 99 mg/dL    Current Facility-Administered Medications  Medication Dose Route Frequency Provider Last Rate Last Dose  . 0.9 %  sodium chloride infusion   Intravenous PRN Tukov-Yual, Arlyss Gandy, NP   Stopped at 08/14/18 1646  . acetaminophen (TYLENOL) tablet 650 mg  650 mg Oral Q6H PRN Lance Coon, MD   650 mg at 08/17/18 0603  . aspirin EC tablet 81 mg  81 mg Oral Daily Salary, Montell D, MD   81 mg at 08/16/18 0803  . budesonide (PULMICORT) nebulizer solution 0.5 mg  0.5 mg Nebulization BID Salary, Montell D, MD   0.5 mg at 08/16/18 0820  . calcium carbonate (TUMS - dosed in mg elemental calcium) chewable tablet 400 mg of elemental calcium  400 mg of elemental calcium Oral TID PRN Henreitta Leber, MD   400 mg of elemental calcium at 08/13/18 0014  . ceFEPIme (MAXIPIME) 2 g in sodium  chloride 0.9 % 100 mL IVPB  2 g Intravenous Q12H Pernell Dupre, RPH   Stopped at 08/17/18 1020  . cloNIDine (CATAPRES - Dosed in mg/24 hr) patch 0.2 mg  0.2 mg Transdermal Weekly Henreitta Leber, MD   0.2 mg at 08/16/18 1311  . enoxaparin (LOVENOX) injection 40 mg  40 mg Subcutaneous Q24H Salary, Montell D, MD   40 mg at 08/16/18 1947  . famotidine (PEPCID) tablet 20 mg  20 mg Oral Daily Charlett Nose, RPH   20 mg at 08/16/18 0802  . feeding supplement (ENSURE ENLIVE) (ENSURE ENLIVE) liquid 237 mL  237 mL Oral TID BM Henreitta Leber, MD   237 mL at 08/16/18 0806  . haloperidol lactate (HALDOL) injection 0.5 mg  0.5 mg Intravenous Q6H Dell Briner T, MD      . haloperidol lactate (HALDOL) injection 2.5 mg  2.5 mg Intravenous Q6H PRN Henreitta Leber, MD   2.5 mg at 08/16/18 1943  . hydrALAZINE (APRESOLINE) injection 10 mg  10 mg Intravenous Q6H PRN Gladstone Lighter, MD   10 mg  at 08/17/18 0450  . insulin aspart (novoLOG) injection 0-15 Units  0-15 Units Subcutaneous TID WC Salary, Montell D, MD   2 Units at 08/17/18 0930  . insulin aspart (novoLOG) injection 0-5 Units  0-5 Units Subcutaneous QHS Loney Hering D, MD   3 Units at 08/15/18 2239  . ipratropium-albuterol (DUONEB) 0.5-2.5 (3) MG/3ML nebulizer solution 3 mL  3 mL Nebulization TID Henreitta Leber, MD   3 mL at 08/16/18 0820  . irbesartan (AVAPRO) tablet 75 mg  75 mg Oral Daily Salary, Montell D, MD   75 mg at 08/16/18 0802  . ketotifen (ZADITOR) 0.025 % ophthalmic solution 1 drop  1 drop Both Eyes BID Salary, Holly Bodily D, MD   1 drop at 08/17/18 0941  . MEDLINE mouth rinse  15 mL Mouth Rinse BID Henreitta Leber, MD   15 mL at 08/17/18 0942  . metoprolol tartrate (LOPRESSOR) tablet 12.5 mg  12.5 mg Oral BID Harrie Foreman, MD   12.5 mg at 08/16/18 0748  . multivitamin with minerals tablet 1 tablet  1 tablet Oral Daily Henreitta Leber, MD   1 tablet at 08/16/18 0803  . olopatadine (PATANOL) 0.1 % ophthalmic solution 1 drop  1 drop Both Eyes Daily Salary, Montell D, MD   1 drop at 08/17/18 1118  . ondansetron (ZOFRAN) injection 4 mg  4 mg Intravenous Q6H PRN Lance Coon, MD   4 mg at 08/13/18 0128  . oxybutynin (DITROPAN) tablet 5 mg  5 mg Oral BID Salary, Montell D, MD   5 mg at 08/16/18 0802  . QUEtiapine (SEROQUEL) tablet 25 mg  25 mg Oral QHS Loletha Grayer, MD   25 mg at 08/16/18 2255  . risperiDONE (RISPERDAL M-TABS) disintegrating tablet 0.5 mg  0.5 mg Oral BID Arta Silence, MD        Musculoskeletal: Strength & Muscle Tone: decreased Gait & Station: unable to stand Patient leans: N/A  Psychiatric Specialty Exam: Physical Exam  Nursing note and vitals reviewed. Constitutional: She appears well-developed and well-nourished.  HENT:  Head: Normocephalic and atraumatic.  Eyes: Pupils are equal, round, and reactive to light. Conjunctivae are normal.  Neck: Normal range of motion.   Cardiovascular: Regular rhythm and normal heart sounds.  Respiratory: Effort normal. No respiratory distress.  GI: Soft.  Musculoskeletal: Normal range of motion.  Neurological: She is alert.  Skin: Skin  is warm and dry.  Psychiatric: Her affect is blunt. Cognition and memory are impaired. She is noncommunicative.    Review of Systems  Unable to perform ROS: Mental status change    Blood pressure (!) 193/76, pulse 96, temperature 97.9 F (36.6 C), temperature source Axillary, resp. rate 20, height '5\' 4"'  (1.626 m), weight 84.3 kg, SpO2 96 %.Body mass index is 31.9 kg/m.  General Appearance: Casual  Eye Contact:  None  Speech:  Garbled  Volume:  Decreased  Mood:  Negative  Affect:  Negative  Thought Process:  Disorganized  Orientation:  Negative  Thought Content:  Negative  Suicidal Thoughts:  No  Homicidal Thoughts:  No  Memory:  Negative  Judgement:  Negative  Insight:  Negative  Psychomotor Activity:  Negative  Concentration:  Concentration: Negative  Recall:  Negative  Fund of Knowledge:  Negative  Language:  Negative  Akathisia:  Negative  Handed:  Right  AIMS (if indicated):     Assets:  Social Support  ADL's:  Impaired  Cognition:  Impaired,  Severe  Sleep:        Treatment Plan Summary: Medication management and Plan Patient remains actively delirious.  Looking back over the past several days it looks like no medicine has been a clear winter as far as definitely improving her delirium.  At least right now she is not agitated to the point of climbing out of the bed but is clearly delirious without any quality of life.  Given that nursing has been unable to give her any oral medicine right now given the dissolvable tablets I am changing the medicine order to include haloperidol 0.5 mg intravenous every 6 hours standing.  We will see if giving this medicine at a low dose but round the clock helps to improve the delirium.  I think this is a better choice than more  Ativan and that possibly if we can use a very low dose but steadily we may see some improvement.  Orders placed.  I will continue to follow up.  Disposition: No evidence of imminent risk to self or others at present.   Patient does not meet criteria for psychiatric inpatient admission.  Alethia Berthold, MD 08/17/2018 11:36 AM

## 2018-08-17 NOTE — Progress Notes (Signed)
Speech Language Pathology Treatment: Dysphagia  Patient Details Name: [name] MRN: [id number] DOB: 05-23-1932 Today's Date: 08/17/2018 Time: 0825-0900 SLP Time Calculation (min) (ACUTE ONLY): 35 min  Assessment / Plan / Recommendation Clinical Impression  Pt seen for ongoing assessment of swallowing and toleration of diet - pt has been on a dysphagia diet (puree foods) w/ NECTAR liquids for ~2-3 days not d/t decline in Cognitive status and risk for aspiration of thin liquids. Pt has not been taking much PO d/t poor Cognitive status and ability to focus on/participate in oral intake. Psychiatry is following; pt has been giving many medications for her ongoing agitation. She frequently calls out many times during a minute but cannot tell you what she wants when asked per NSG; same noted by SLP today. Pt is currently awake w/ mumbled speech and calling out frequently; requires max cues.  Pt was positioned upright in bed for trials. Pt mumbled "yes" she wanted something to drink but did not respond consistently to tactile stim of boluses placed at her lips. She did not make an oral attempt to accept bolus material and when they were placed at her lips and anterior buccal area/anterior tongue, pt spit out the bolus material; no pharyngeal swallow was appreciated. This happened consistently w/ different Nectar liquids and Purees including an ice cream. No further trials were given d/t her declined Cognitive status. NSG followed SLP w/ attempted po trials and po meds in Puree w/ pt spitting out of all boluses; Meds not given per NSG. NSG also reported elevated BP and HR; MD ordered MRI. Recommend continue w/ Dysphagia diet d/t her baseline Dementia and further declined Cognitive status impacting awareness for oral intake; Dysphagia 1(puree) w/ NECTAR consistency liquids via TSP/Cup monitoring all boluses. Pt MUST be fully awake, alert for being fed foods/liquids. Recommend strict aspiration  precautions; Pills CRUSHED in puree as able; full feeding assistance at all meals.  ST services will f/u next 2-3 days for toleration of diet. Recommend consideration of less sedating medications for pt's agitation d/t the impact of lethargy on her functional ability to safely participate in ADLs and eating/drinking meals to meet nutrition/hydration needs orally. NSG updated who will address w/ MD also. Recommend continued f/u by Palliative Care services for GOC in light of reduced oral intake.     HPI HPI: Pt is a 84 y.o. female with a known history which includes Dementia per chart, GERD, vertigo, hip fx w/ repair, anxiety/depression, DM, neuropathy presenting from nursing facility with altered mental status, found to be hypoxic on presentation to the emergency room, patient had ? Flu a week ago, had not been feeling well during this period of time, had cough, in the emergency room patient was unresponsive, patient was found to have acute hypoxic hypercarbic respiratory failure on blood gas-started on BiPAP, CT head noted for extensive sinus disease. CT of the chest noted for Bronchitic changes with scattered Atelectasis in the lower lobes, right-sided pneumonia, marked enlargement of the Left thyroid lobe. Patient admitted for acute toxic metabolic encephalopathy due to acute pneumonia and polypharmacy.  Today, pt is alert, talkative w/ declined Cognition noted during verbal engagement. Pt follows instruction given Mod verbal/tactile cues; easily distracted also.  Noted Edentulous at baseline.       SLP Plan  Continue with current plan of care;Consult other service (comment)(Palliative Care f/u for GOC)       Recommendations  Diet recommendations: Dysphagia 1 (puree);Nectar-thick liquid Liquids provided via: Teaspoon;Cup Medication Administration: Crushed with  puree Supervision: Full supervision/cueing for compensatory strategies;Trained caregiver to feed patient Compensations: Minimize  environmental distractions;Slow rate;Small sips/bites;Lingual sweep for clearance of pocketing;Multiple dry swallows after each bite/sip;Follow solids with liquid Postural Changes and/or Swallow Maneuvers: Seated upright 90 degrees;Upright 30-60 min after meal                General recommendations: (Dietician f/u) Oral Care Recommendations: Oral care BID;Staff/trained caregiver to provide oral care Follow up Recommendations: Skilled Nursing facility SLP Visit Diagnosis: Dysphagia, oropharyngeal phase (R13.12)(declined Cognitive status/Dementia) Plan: Continue with current plan of care;Consult other service (comment)(Palliative Care f/u for GOC)       GO                Jerilynn Som, MS, CCC-SLP Watson,Katherine 08/17/2018, 11:34 AM

## 2018-08-17 NOTE — Care Management Important Message (Signed)
Important Message  Patient Details  Name: [name] MRN: [id number] Date of Birth: [birth year]   Medicare Important Message Given:  Yes    [name] 08/17/2018, 11:34 AM

## 2018-08-17 NOTE — Progress Notes (Signed)
Meds not given, pt spit out meds.  BP 195/91, HR 108.  Dr Cherlynn Kaiser notified of the above. Per MD to order labetalol 10mg  IV once.

## 2018-08-17 NOTE — Progress Notes (Signed)
Dr. Renae Gloss ordered Seroquel for patient at bedtime, patient did take, and was able to sleep for a couple of hours. Patient woke up again yelling out "help me", but will not communicate needs to staff. Dr. Bosie Helper notified and ordered another dose of seroquel with no relief, morphine IV ordered to medicate for pain. Will continue to assess. [name]

## 2018-08-18 LAB — BASIC METABOLIC PANEL
ANION GAP: 13 (ref 5–15)
BUN: 14 mg/dL (ref 8–23)
CALCIUM: 8.6 mg/dL — AB (ref 8.9–10.3)
CO2: 27 mmol/L (ref 22–32)
CREATININE: 0.49 mg/dL (ref 0.44–1.00)
Chloride: 104 mmol/L (ref 98–111)
GLUCOSE: 154 mg/dL — AB (ref 70–99)
Potassium: 3.3 mmol/L — ABNORMAL LOW (ref 3.5–5.1)
Sodium: 144 mmol/L (ref 135–145)

## 2018-08-18 LAB — GLUCOSE, CAPILLARY
GLUCOSE-CAPILLARY: 145 mg/dL — AB (ref 70–99)
GLUCOSE-CAPILLARY: 191 mg/dL — AB (ref 70–99)
Glucose-Capillary: 157 mg/dL — ABNORMAL HIGH (ref 70–99)
Glucose-Capillary: 148 mg/dL — ABNORMAL HIGH (ref 70–99)

## 2018-08-18 MED ORDER — POTASSIUM CHLORIDE 10 MEQ/100ML IV SOLN
10.0000 meq | INTRAVENOUS | Status: AC
  Administered 2018-08-18 (×2): 10 meq via INTRAVENOUS
  Filled 2018-08-18 (×2): qty 100

## 2018-08-18 MED ORDER — IPRATROPIUM-ALBUTEROL 0.5-2.5 (3) MG/3ML IN SOLN
3.0000 mL | Freq: Two times a day (BID) | RESPIRATORY_TRACT | Status: DC
  Administered 2018-08-18 – 2018-08-24 (×9): 3 mL via RESPIRATORY_TRACT
  Filled 2018-08-18 (×12): qty 3

## 2018-08-18 MED ORDER — IRBESARTAN 150 MG PO TABS
150.0000 mg | ORAL_TABLET | Freq: Every day | ORAL | Status: DC
  Administered 2018-08-19 – 2018-08-24 (×6): 150 mg via ORAL
  Filled 2018-08-18 (×6): qty 1

## 2018-08-18 NOTE — Progress Notes (Signed)
[employer] [facility] [place] [facility]   PATIENT NAME: [name]    MR#:  [id number]  DATE OF BIRTH:  [birth year]  SUBJECTIVE:   Patient remains somewhat lethargic but follows simple commands and more cooperative today.  No other acute events overnight.  REVIEW OF SYSTEMS:    Review of Systems  Unable to perform ROS: Mental acuity   Nutrition: Dysphagia 1 Tolerating Diet: No Tolerating PT: Await Eval.   DRUG ALLERGIES:   Allergies  Allergen Reactions  . Ampicillin     Zpac  . Sulfa Antibiotics     VITALS:  Blood pressure (!) 178/79, pulse 97, temperature 97.6 F (36.4 C), temperature source Oral, resp. rate 18, height 5\' 4"  (1.626 m), weight 84.3 kg, SpO2 97 %.  PHYSICAL EXAMINATION:   Physical Exam  GENERAL:  84 y.o.-year-old patient lying in bed lethargic but follows simple commands.  EYES: Pupils equal, round, reactive to light. No scleral icterus. Extraocular muscles intact.  HEENT: Head atraumatic, normocephalic. Oropharynx and nasopharynx clear.  Dry Oral Mucosa. NECK:  Supple, no jugular venous distention. No thyroid enlargement, no tenderness.  LUNGS: Poor Resp. effort, no wheezing, rales, rhonchi. No use of accessory muscles of respiration.  CARDIOVASCULAR: S1, S2 normal. No murmurs, rubs, or gallops.  ABDOMEN: Soft, nontender, nondistended. Bowel sounds present. No organomegaly or mass.  EXTREMITIES: No cyanosis, clubbing or edema b/l.    NEUROLOGIC: Encephalopathic/lethargic but follows simple commands.  Globally weak.  PSYCHIATRIC: AAO X 1.   SKIN: No obvious rash, lesion, or ulcer.    LABORATORY PANEL:   CBC Recent Labs  Lab 08/16/18 0638  WBC 13.1*  HGB 11.3*  HCT 37.6  PLT 273   ------------------------------------------------------------------------------------------------------------------  Chemistries  Recent Labs  Lab 08/18/18 0453  NA 144  K 3.3*  CL 104  CO2 27  GLUCOSE 154*  BUN 14  CREATININE 0.49  CALCIUM  8.6*   ------------------------------------------------------------------------------------------------------------------  Cardiac Enzymes Recent Labs  Lab 08/12/18 0441  TROPONINI 0.03*   ------------------------------------------------------------------------------------------------------------------  RADIOLOGY:  Dg Pelvis 1-2 Views  Result Date: 08/17/2018 CLINICAL DATA:  Evaluate for possible retained metallic foreign body EXAM: PELVIS - 1-2 VIEW COMPARISON:  None. FINDINGS: No acute bony abnormality is seen. No metallic foreign bodies are identified. IMPRESSION: No evidence of radiopaque foreign body. Electronically Signed   By: Alcide Clever M.D.   On: 08/17/2018 15:34   Dg Abd 1 View  Result Date: 08/17/2018 CLINICAL DATA:  Evaluate for possible retained foreign body EXAM: ABDOMEN - 1 VIEW COMPARISON:  None. FINDINGS: Changes consistent with prior cholecystectomy are noted. Nonobstructive bowel gas pattern is seen. Degenerative changes of lumbar spine are noted. No definitive radiopaque foreign body is seen. IMPRESSION: No radiopaque foreign body noted. Electronically Signed   By: Alcide Clever M.D.   On: 08/17/2018 15:35   Mr Brain Wo Contrast  Result Date: 08/17/2018 CLINICAL DATA:  Initial evaluation for acute altered mental status. EXAM: MRI HEAD WITHOUT CONTRAST TECHNIQUE: Multiplanar, multiecho pulse sequences of the brain and surrounding structures were obtained without intravenous contrast. COMPARISON:  Prior CT from 08/11/2018. FINDINGS: Brain: Examination moderately degraded by motion artifact and patient positioning. Moderately advanced cerebral atrophy with mild chronic small vessel ischemic disease. Superimposed remote lacunar infarct present within the left lentiform nucleus. No abnormal foci of restricted diffusion to suggest acute or subacute ischemia. Gray-white matter differentiation maintained. No areas of remote cortical infarction identified. No acute or chronic  intracranial hemorrhage. No mass lesion, midline shift or mass  effect. No hydrocephalus. No extra-axial fluid collection. Vascular: Major intracranial vascular flow voids grossly maintained at the skull base. Skull and upper cervical spine: Craniocervical junction poorly evaluated on this exam due to motion. No obvious focal marrow replacing lesion. Scalp soft tissues unremarkable. Sinuses/Orbits: Patient status post ocular lens replacement bilaterally. Moderate mucosal thickening throughout the paranasal sinuses with complete opacification of the left maxillary and frontal sinuses. It appears mildly progressed relative to prior CT. Opacification of the left mastoid air cells and left middle ear cavity, also progressed from prior CT. No obvious abnormality at the left nasopharynx. Small right mastoid effusion noted as well. Other: None. IMPRESSION: 1. Moderately degraded examination due to motion artifact. No definite acute intracranial abnormality identified. 2. Moderately advanced cerebral atrophy with mild chronic small vessel ischemic disease. 3. Progressive left greater than right paranasal sinus disease and left mastoid effusion. Electronically Signed   By: Rise Mu M.D.   On: 08/17/2018 17:33     ASSESSMENT AND PLAN:   84 year old female with past medical history of diabetes, hypertension, GERD, hyperlipidemia, anxiety/depression, neuropathy who presented to the hospital due to weakness, cough and altered mental status and noted to have pneumonia.  1.  Altered mental status- this is likely combination of polypharmacy, underlying dementia, underlying pneumonia.  Patient also had some mild hypercapnia which has resolved now. - Patient received multiple doses of Ativan previously which has led to her encephalopathy/confusion. -Seen by psychiatry and cont. Risperdal and as needed Haldol.  Mental status improved a bit today.  -MRI of the brain yesterday showed no acute pathology.  Unlikely a  neurogenic source.  2.  Pneumonia-this was the source of patient's weakness and shortness of breath. - much improved and pt. Has finished 7 days of IV Cefepime.   3.  COPD-mild acute exacerbation secondary to pneumonia. -Continue scheduled duo nebs, Pulmicort nebs.  4.  History of urinary incontinence-continue oxybutynin.  5.  Accelerated HTN - BP improved.  - cont. Clonidine patch. Will raise Avapro dose.  Cont. PRN hydralazine, labetalol.  6. Anxiety/depresion -avoid Ativan, continue Seroquel at bedtime, continue Risperdal. -Appreciate psychiatric input.  Appreciate palliative care input and patient is a DNR, if patient's p.o. intake continues to be poor and her encephalopathy does not improve patient would likely benefit from hospice services.  Palliative care has addressed this with the patient's son but he is hopeful and wants to continue current care.  All the records are reviewed and case discussed with Care Management/Social Worker. Management plans discussed with the patient, family and they are in agreement.  CODE STATUS: DNR  DVT Prophylaxis: Lovenox  TOTAL TIME TAKING CARE OF THIS PATIENT: 30 minutes.   POSSIBLE D/C unclear DAYS, DEPENDING ON CLINICAL CONDITION and progress.   Houston Siren M.D on 08/18/2018 at 2:14 PM  Between 7am to 6pm - Pager - [phone]  After 6pm go to [email or web address] - Scientist, research (life sciences) [facility]  Office  [phone]  CC: Primary care physician; Lauro Regulus, MD

## 2018-08-18 NOTE — Plan of Care (Signed)
  Problem: Education: Goal: Knowledge of General Education information will improve Description Including pain rating scale, medication(s)/side effects and non-pharmacologic comfort measures Outcome: Progressing   Problem: Clinical Measurements: Goal: Respiratory complications will improve Outcome: Progressing Goal: Cardiovascular complication will be avoided Outcome: Progressing   Problem: Activity: Goal: Risk for activity intolerance will decrease Outcome: Progressing   Problem: Nutrition: Goal: Adequate nutrition will be maintained Outcome: Progressing   Problem: Elimination: Goal: Will not experience complications related to bowel motility Outcome: Progressing Goal: Will not experience complications related to urinary retention Outcome: Progressing   Problem: Safety: Goal: Ability to remain free from injury will improve Outcome: Progressing   Problem: Skin Integrity: Goal: Risk for impaired skin integrity will decrease Outcome: Progressing  More alert eating a little more

## 2018-08-19 LAB — BASIC METABOLIC PANEL
Anion gap: 8 (ref 5–15)
BUN: 18 mg/dL (ref 8–23)
CALCIUM: 8.9 mg/dL (ref 8.9–10.3)
CHLORIDE: 105 mmol/L (ref 98–111)
CO2: 31 mmol/L (ref 22–32)
CREATININE: 0.57 mg/dL (ref 0.44–1.00)
GFR calc non Af Amer: >60 mL/min (ref >60)
Glucose, Bld: 159 mg/dL — ABNORMAL HIGH (ref 70–99)
Potassium: 3.6 mmol/L (ref 3.5–5.1)
SODIUM: 144 mmol/L (ref 135–145)

## 2018-08-19 LAB — CBC
HCT: 36.4 % (ref 36.0–46.0)
HEMOGLOBIN: 11 g/dL — AB (ref 12.0–15.0)
MCH: 25.3 pg — ABNORMAL LOW (ref 26.0–34.0)
MCHC: 30.2 g/dL (ref 30.0–36.0)
MCV: 83.9 fL (ref 80.0–100.0)
Platelets: 264 10*3/uL (ref 150–400)
RBC: 4.34 MIL/uL (ref 3.87–5.11)
RDW: 13.8 % (ref 11.5–15.5)
WBC: 10.1 10*3/uL (ref 4.0–10.5)
nRBC: 0 % (ref 0.0–0.2)

## 2018-08-19 LAB — GLUCOSE, CAPILLARY
GLUCOSE-CAPILLARY: 163 mg/dL — AB (ref 70–99)
GLUCOSE-CAPILLARY: 168 mg/dL — AB (ref 70–99)
Glucose-Capillary: 153 mg/dL — ABNORMAL HIGH (ref 70–99)
Glucose-Capillary: 137 mg/dL — ABNORMAL HIGH (ref 70–99)

## 2018-08-19 MED ORDER — HALOPERIDOL LACTATE 5 MG/ML IJ SOLN
0.5000 mg | Freq: Four times a day (QID) | INTRAMUSCULAR | Status: DC | PRN
  Administered 2018-08-22: 0.5 mg via INTRAVENOUS
  Filled 2018-08-19: qty 1

## 2018-08-19 MED ORDER — RISPERIDONE 1 MG PO TBDP
0.5000 mg | ORAL_TABLET | Freq: Two times a day (BID) | ORAL | Status: DC | PRN
  Filled 2018-08-19: qty 0.5

## 2018-08-19 MED ORDER — QUETIAPINE FUMARATE 25 MG PO TABS
12.5000 mg | ORAL_TABLET | Freq: Every day | ORAL | Status: DC
  Administered 2018-08-19: 23:00:00 12.5 mg via ORAL
  Filled 2018-08-19: qty 1

## 2018-08-19 NOTE — Consult Note (Signed)
[facility] Consult   Reason for Consult: Consult for 84 year old woman with delirium Referring Physician: Verdell Carmine Patient Identification: [name] MRN:  [id number] Principal Diagnosis: Acute delirium Diagnosis:   Patient Active Problem List   Diagnosis Date Noted  . Acute delirium [R41.0] 08/16/2018  . Dementia with behavioral disturbance ([place]) [F03.91] 08/16/2018  . Encephalopathy acute [G93.40] 08/11/2018  . Sepsis ([place]) [A41.9] 05/21/2017  . Aspiration pneumonia ([place]) [J69.0] 05/21/2017  . GERD (gastroesophageal reflux disease) [K21.9] 05/21/2017  . HLD (hyperlipidemia) [E78.5] 05/21/2017  . Depression [F32.9] 05/21/2017  . Anxiety [F41.9] 05/21/2017  . Elevated troponin [R79.89] 05/21/2017  . Diabetes ([place]) [E11.9] 05/21/2017  . Right humeral fracture [S42.301A] 05/21/2017  . Nausea and vomiting [R11.2] 03/31/2015  . Microcytic anemia [D50.9] 03/31/2015    Total Time spent with patient: 15 minutes  Subjective:   [name] is a 84 y.o. female patient  with multiple medical the medical floor secondary to altered mental status and pneumonia.  The patient was initially very agitated and confused and was given several psychotropic medications by Dr. Weber Cooks yesterday.  She is much calmer today and even somewhat sedated.  The patient was able to open her eyes but cannot verbalize any full sentences or respond any questions from this Probation officer.  Her nursing, she has been much calmer overall.  Her son was at the bedside and reported that she does recognize her son and her former daughter-in-law and has been much calmer overall.  No suicidal thoughts noted no active hallucinations   Past Psychiatric History: Only history of established dementia although it sounds like as recently as a month ago she had quality of life and some appropriate interaction.  No other serious psychiatric history identified  Social History: She resides in a nursing facility. Son is at  bedside and supportive   Risk to Self:  Yes Risk to Others:  No Prior Inpatient Therapy:   Unknown at this time Prior Outpatient Therapy:  Unknown at this time  Past Medical History:  Past Medical History:  Diagnosis Date  . Anxiety   . Depression   . Diabetes mellitus without complication ([place])   . GERD (gastroesophageal reflux disease)   . Hip fracture ([place])   . Hyperlipemia   . Neuropathy   . Vertigo     Past Surgical History:  Procedure Laterality Date  . ABDOMINAL HYSTERECTOMY    . APPENDECTOMY    . BACK SURGERY     tumor removal bengin  . CHOLECYSTECTOMY     Family History:  Family History  Problem Relation Age of Onset  . Diabetes Mother   . Diabetes Sister        x2  . Breast cancer Sister   . Heart disease Father   . Heart disease Brother        x3  . Bladder Cancer Neg Hx   . Kidney cancer Neg Hx   . Prostate cancer Neg Hx    Family Psychiatric  History: Unknown Social History:  Social History   Substance and Sexual Activity  Alcohol Use No  . Alcohol/week: 0.0 standard drinks     Social History   Substance and Sexual Activity  Drug Use No    Social History   Socioeconomic History  . Marital status: Widowed    Spouse name: Not on file  . Number of children: Not on file  . Years of education: Not on file  . Highest education level: Not on file  Occupational  History  . Occupation: Retired  Scientific laboratory technician  . Financial resource strain: Not on file  . Food insecurity:    Worry: Not on file    Inability: Not on file  . Transportation needs:    Medical: Not on file    Non-medical: Not on file  Tobacco Use  . Smoking status: Never Smoker  . Smokeless tobacco: Never Used  Substance and Sexual Activity  . Alcohol use: No    Alcohol/week: 0.0 standard drinks  . Drug use: No  . Sexual activity: Not on file  Lifestyle  . Physical activity:    Days per week: Not on file    Minutes per session: Not on file  . Stress: Not on file   Relationships  . Social connections:    Talks on phone: Not on file    Gets together: Not on file    Attends religious service: Not on file    Active member of club or organization: Not on file    Attends meetings of clubs or organizations: Not on file    Relationship status: Not on file  Other Topics Concern  . Not on file  Social History Narrative  . Not on file   Additional Social History:    Allergies:   Allergies  Allergen Reactions  . Ampicillin     Zpac  . Sulfa Antibiotics     Labs:  Results for orders placed or performed during the hospital encounter of 08/11/18 (from the past 48 hour(s))  Glucose, capillary     Status: Abnormal   Collection Time: 08/17/18  5:04 PM  Result Value Ref Range   Glucose-Capillary 172 (H) 70 - 99 mg/dL  Glucose, capillary     Status: Abnormal   Collection Time: 08/17/18  9:08 PM  Result Value Ref Range   Glucose-Capillary 162 (H) 70 - 99 mg/dL  Basic metabolic panel     Status: Abnormal   Collection Time: 08/18/18  4:53 AM  Result Value Ref Range   Sodium 144 135 - 145 mmol/L   Potassium 3.3 (L) 3.5 - 5.1 mmol/L   Chloride 104 98 - 111 mmol/L   CO2 27 22 - 32 mmol/L   Glucose, Bld 154 (H) 70 - 99 mg/dL   BUN 14 8 - 23 mg/dL   Creatinine, Ser 0.49 0.44 - 1.00 mg/dL   Calcium 8.6 (L) 8.9 - 10.3 mg/dL   GFR calc non Af Amer >60 >60 mL/min   GFR calc Af Amer >60 >60 mL/min    Comment: (NOTE) The eGFR has been calculated using the CKD EPI equation. This calculation has not been validated in all clinical situations. eGFR's persistently <60 mL/min signify possible Chronic Kidney Disease.    Anion gap 13 5 - 15    Comment: Performed at [facility], [address] [place], [place] [address]  Glucose, capillary     Status: Abnormal   Collection Time: 08/18/18  7:37 AM  Result Value Ref Range   Glucose-Capillary 157 (H) 70 - 99 mg/dL  Glucose, capillary     Status: Abnormal   Collection Time: 08/18/18 11:33 AM   Result Value Ref Range   Glucose-Capillary 145 (H) 70 - 99 mg/dL  Glucose, capillary     Status: Abnormal   Collection Time: 08/18/18  4:48 PM  Result Value Ref Range   Glucose-Capillary 191 (H) 70 - 99 mg/dL  Glucose, capillary     Status: Abnormal   Collection Time: 08/18/18  9:42  PM  Result Value Ref Range   Glucose-Capillary 148 (H) 70 - 99 mg/dL  CBC     Status: Abnormal   Collection Time: 08/19/18  3:34 AM  Result Value Ref Range   WBC 10.1 4.0 - 10.5 K/uL   RBC 4.34 3.87 - 5.11 MIL/uL   Hemoglobin 11.0 (L) 12.0 - 15.0 g/dL   HCT 36.4 36.0 - 46.0 %   MCV 83.9 80.0 - 100.0 fL   MCH 25.3 (L) 26.0 - 34.0 pg   MCHC 30.2 30.0 - 36.0 g/dL   RDW 13.8 11.5 - 15.5 %   Platelets 264 150 - 400 K/uL   nRBC 0.0 0.0 - 0.2 %    Comment: Performed at [facility], [address] [place], [place] [address]  Basic metabolic panel     Status: Abnormal   Collection Time: 08/19/18  3:34 AM  Result Value Ref Range   Sodium 144 135 - 145 mmol/L   Potassium 3.6 3.5 - 5.1 mmol/L   Chloride 105 98 - 111 mmol/L   CO2 31 22 - 32 mmol/L   Glucose, Bld 159 (H) 70 - 99 mg/dL   BUN 18 8 - 23 mg/dL   Creatinine, Ser 0.57 0.44 - 1.00 mg/dL   Calcium 8.9 8.9 - 10.3 mg/dL   GFR calc non Af Amer >60 >60 mL/min   GFR calc Af Amer >60 >60 mL/min    Comment: (NOTE) The eGFR has been calculated using the CKD EPI equation. This calculation has not been validated in all clinical situations. eGFR's persistently <60 mL/min signify possible Chronic Kidney Disease.    Anion gap 8 5 - 15    Comment: Performed at [facility], [address] [place], [place] [address]  Glucose, capillary     Status: Abnormal   Collection Time: 08/19/18  9:39 AM  Result Value Ref Range   Glucose-Capillary 163 (H) 70 - 99 mg/dL  Glucose, capillary     Status: Abnormal   Collection Time: 08/19/18 11:53 AM  Result Value Ref Range   Glucose-Capillary 168 (H) 70 - 99 mg/dL    Current  Facility-Administered Medications  Medication Dose Route Frequency Provider Last Rate Last Dose  . 0.9 %  sodium chloride infusion   Intravenous PRN Tukov-Yual, Arlyss Gandy, NP   Stopped at 08/17/18 1556  . acetaminophen (TYLENOL) tablet 650 mg  650 mg Oral Q6H PRN Lance Coon, MD   650 mg at 08/17/18 0603  . aspirin EC tablet 81 mg  81 mg Oral Daily Salary, Montell D, MD   81 mg at 08/19/18 1004  . budesonide (PULMICORT) nebulizer solution 0.5 mg  0.5 mg Nebulization BID Salary, Montell D, MD   0.5 mg at 08/19/18 0733  . calcium carbonate (TUMS - dosed in mg elemental calcium) chewable tablet 400 mg of elemental calcium  400 mg of elemental calcium Oral TID PRN Henreitta Leber, MD   400 mg of elemental calcium at 08/13/18 0014  . cloNIDine (CATAPRES - Dosed in mg/24 hr) patch 0.2 mg  0.2 mg Transdermal Weekly Henreitta Leber, MD   0.2 mg at 08/16/18 1311  . enoxaparin (LOVENOX) injection 40 mg  40 mg Subcutaneous Q24H Salary, Holly Bodily D, MD   40 mg at 08/18/18 2338  . famotidine (PEPCID) tablet 20 mg  20 mg Oral Daily Charlett Nose, RPH   20 mg at 08/19/18 1004  . haloperidol lactate (HALDOL) injection 0.5 mg  0.5 mg Intravenous Q6H  PRN Loletha Grayer, MD      . hydrALAZINE (APRESOLINE) injection 10 mg  10 mg Intravenous Q6H PRN Gladstone Lighter, MD   10 mg at 08/18/18 0457  . insulin aspart (novoLOG) injection 0-15 Units  0-15 Units Subcutaneous TID WC Salary, Avel Peace, MD   3 Units at 08/19/18 1237  . insulin aspart (novoLOG) injection 0-5 Units  0-5 Units Subcutaneous QHS Loney Hering D, MD   3 Units at 08/15/18 2239  . ipratropium-albuterol (DUONEB) 0.5-2.5 (3) MG/3ML nebulizer solution 3 mL  3 mL Nebulization BID Henreitta Leber, MD   3 mL at 08/19/18 0733  . irbesartan (AVAPRO) tablet 150 mg  150 mg Oral Daily Henreitta Leber, MD   150 mg at 08/19/18 1240  . ketotifen (ZADITOR) 0.025 % ophthalmic solution 1 drop  1 drop Both Eyes BID Salary, Montell D, MD   1 drop at 08/19/18  1240  . MEDLINE mouth rinse  15 mL Mouth Rinse BID Henreitta Leber, MD   15 mL at 08/19/18 1241  . metoprolol tartrate (LOPRESSOR) tablet 12.5 mg  12.5 mg Oral BID Harrie Foreman, MD   12.5 mg at 08/19/18 1245  . multivitamin with minerals tablet 1 tablet  1 tablet Oral Daily Henreitta Leber, MD   1 tablet at 08/19/18 1240  . olopatadine (PATANOL) 0.1 % ophthalmic solution 1 drop  1 drop Both Eyes Daily Salary, Montell D, MD   1 drop at 08/19/18 1240  . ondansetron (ZOFRAN) injection 4 mg  4 mg Intravenous Q6H PRN Lance Coon, MD   4 mg at 08/13/18 0128  . QUEtiapine (SEROQUEL) tablet 12.5 mg  12.5 mg Oral QHS Wieting, Richard, MD      . risperiDONE (RISPERDAL M-TABS) disintegrating tablet 0.5 mg  0.5 mg Oral BID PRN Chauncey Mann, MD        Musculoskeletal: Strength & Muscle Tone: decreased Gait & Station: unable to stand Patient leans: N/A  Psychiatric Specialty Exam: Physical Exam  Nursing note and vitals reviewed. Constitutional: She appears well-developed and well-nourished.  HENT:  Head: Normocephalic and atraumatic.  Eyes: Pupils are equal, round, and reactive to light. Conjunctivae are normal.  Neck: Normal range of motion.  Cardiovascular: Regular rhythm and normal heart sounds.  Respiratory: Effort normal. No respiratory distress.  GI: Soft.  Musculoskeletal: Normal range of motion.  Neurological: She is alert.  Skin: Skin is warm and dry.  Psychiatric: Her affect is blunt. Cognition and memory are impaired. She is noncommunicative.    Review of Systems  Unable to perform ROS: Mental status change    Blood pressure (!) 132/55, pulse 97, temperature 98.1 F (36.7 C), temperature source Oral, resp. rate 17, height 5' 4" (1.626 m), weight 84.3 kg, SpO2 96 %.Body mass index is 31.9 kg/m.  General Appearance: Casual  Eye Contact:  Eyes closed and barely open when questioned  Speech:  Garbled  Volume:  Decreased  Mood: Unable to answer  Affect:  Flat  Thought  Process:  Unable to assess, sedated  Orientation: Unable to assess  Thought Content:  Unable to assess  Suicidal Thoughts: Not verbalized  Homicidal Thoughts: Not verbalized  Memory:  Unable to assess  Judgement:  Unable to assess  Insight:  Unable to assess  Psychomotor Activity:  Negative  Concentration:  Concentration: Negative  Recall:  Negative  Fund of Knowledge:  Negative  Language:  Negative  Akathisia:  Negative  Handed:  Right  AIMS (if indicated):  Assets:  Social Support  ADL's:  Impaired  Cognition:  Impaired,  Severe  Sleep:        Treatment Plan Summary:   Ms. Tabbert is a 84 year old female with multiple medical problems admitted to the inpatient medical floor with pneumonia and altered mental status.  She was having problems with delirium and agitation.  She was put on several psychotropic medications with improvement.  Delirium - Will change Risperdal M-Tab and Haldol IV to PRN and continue Seroquel 12.67m po nightly scheduled. -There does appear to be some improvement overall in terms of agitation -Pneumonia is most likely underlying contributing factor to altered mental status -MRI Head showed moderately advanced cerebral atrophy and chronic small vessel ischemic disease -We will continue to follow -No need for inpatient psychiatric hospitalization at this time    Disposition: No evidence of imminent risk to self or others at present.   Patient does not meet criteria for psychiatric inpatient admission.  AChauncey Mann MD 08/19/2018 2:05 PM

## 2018-08-19 NOTE — Progress Notes (Addendum)
Patient ID: [name], female   DOB: 04/05/1932, 84 y.o.   MRN: [id number]  [employer] PROGRESS NOTE  [name] [id number] DOB: [birth year] DOA: 08/11/2018 PCP: Jessica Regulus, MD  HPI/Subjective: Patient alert and awake.  As per nursing staff she slept last night.  Son mentions she is starting to eat a little bit better.  Patient offers no complaints.  Objective: Vitals:   08/19/18 0336 08/19/18 0733  BP: (!) 131/59   Pulse: 90   Resp: 17   Temp: 98.1 F (36.7 C)   SpO2: 97% 95%    Filed Weights   08/11/18 1254 08/13/18 1434  Weight: 84 kg 84.3 kg    ROS: Review of Systems  Unable to perform ROS: Mental status change  Respiratory: Negative for cough and shortness of breath.   Gastrointestinal: Negative for abdominal pain, nausea and vomiting.  Musculoskeletal: Negative for joint pain.   Exam: Physical Exam  HENT:  Nose: No mucosal edema.  Mouth/Throat: No oropharyngeal exudate or posterior oropharyngeal edema.  Eyes: Pupils are equal, round, and reactive to light. Conjunctivae, EOM and lids are normal.  Neck: No JVD present. Carotid bruit is not present. No edema present. No thyroid mass and no thyromegaly present.  Cardiovascular: S1 normal and S2 normal. Exam reveals no gallop.  No murmur heard. Pulses:      Dorsalis pedis pulses are 2+ on the right side, and 2+ on the left side.  Respiratory: No respiratory distress. She has decreased breath sounds in the right lower field and the left lower field. She has no wheezes. She has rhonchi in the right lower field and the left lower field. She has no rales.  GI: Soft. Bowel sounds are normal. There is no tenderness.  Musculoskeletal:       Right ankle: She exhibits swelling.       Left ankle: She exhibits swelling.  Lymphadenopathy:    She has no cervical adenopathy.  Neurological: She is alert.  Patient follows some simple commands and able to straight leg raise.  Skin: Skin is warm. No rash  noted. Nails show no clubbing.  Psychiatric:  Patient answers some yes/no questions but does not elaborate      Data Reviewed: Basic Metabolic Panel: Recent Labs  Lab 08/14/18 1102 08/16/18 0638 08/17/18 0521 08/18/18 0453 08/19/18 0334  NA 145 143 144 144 144  K 4.2 3.5 3.1* 3.3* 3.6  CL 113* 105 107 104 105  CO2 27 27 27 27 31   GLUCOSE 173* 149* 149* 154* 159*  BUN 15 17 16 14 18   CREATININE 0.69 0.48 0.52 0.49 0.57  CALCIUM 8.8* 9.1 9.0 8.6* 8.9    Recent Labs  Lab 08/15/18 1149  AMMONIA 19   CBC: Recent Labs  Lab 08/16/18 0638 08/19/18 0334  WBC 13.1* 10.1  HGB 11.3* 11.0*  HCT 37.6 36.4  MCV 84.1 83.9  PLT 273 264   BNP (last 3 results) Recent Labs    08/11/18 1300  BNP 96.0    CBG: Recent Labs  Lab 08/17/18 2108 08/18/18 0737 08/18/18 1133 08/18/18 1648 08/18/18 2142  GLUCAP 162* 157* 145* 191* 148*    Recent Results (from the past 240 hour(s))  Culture, blood (routine x 2)     Status: None   Collection Time: 08/11/18  3:51 PM  Result Value Ref Range Status   Specimen Description BLOOD L AC  Final   Special Requests   Final    BOTTLES DRAWN AEROBIC  AND ANAEROBIC Blood Culture adequate volume   Culture   Final    NO GROWTH 5 DAYS Performed at [facility], [address] [place], [place] [address]    Report Status 08/16/2018 FINAL  Final  Culture, blood (routine x 2)     Status: None   Collection Time: 08/11/18  4:20 PM  Result Value Ref Range Status   Specimen Description BLOOD RFA  Final   Special Requests   Final    BOTTLES DRAWN AEROBIC AND ANAEROBIC Blood Culture results may not be optimal due to an inadequate volume of blood received in culture bottles   Culture   Final    NO GROWTH 5 DAYS Performed at [facility], [address] [place], [place] [address]    Report Status 08/16/2018 FINAL  Final  MRSA PCR Screening     Status: None   Collection Time: 08/11/18 11:58 PM  Result Value Ref Range Status    MRSA by PCR NEGATIVE NEGATIVE Final    Comment:        The GeneXpert MRSA Assay (FDA approved for NASAL specimens only), is one component of a comprehensive MRSA colonization surveillance program. It is not intended to diagnose MRSA infection nor to guide or monitor treatment for MRSA infections. Performed at [facility], [address] [place], [place] [address]      Studies: Dg Pelvis 1-2 Views  Result Date: 08/17/2018 CLINICAL DATA:  Evaluate for possible retained metallic foreign body EXAM: PELVIS - 1-2 VIEW COMPARISON:  None. FINDINGS: No acute bony abnormality is seen. No metallic foreign bodies are identified. IMPRESSION: No evidence of radiopaque foreign body. Electronically Signed   By: Alcide Clever M.D.   On: 08/17/2018 15:34   Dg Abd 1 View  Result Date: 08/17/2018 CLINICAL DATA:  Evaluate for possible retained foreign body EXAM: ABDOMEN - 1 VIEW COMPARISON:  None. FINDINGS: Changes consistent with prior cholecystectomy are noted. Nonobstructive bowel gas pattern is seen. Degenerative changes of lumbar spine are noted. No definitive radiopaque foreign body is seen. IMPRESSION: No radiopaque foreign body noted. Electronically Signed   By: Alcide Clever M.D.   On: 08/17/2018 15:35   Mr Brain Wo Contrast  Result Date: 08/17/2018 CLINICAL DATA:  Initial evaluation for acute altered mental status. EXAM: MRI HEAD WITHOUT CONTRAST TECHNIQUE: Multiplanar, multiecho pulse sequences of the brain and surrounding structures were obtained without intravenous contrast. COMPARISON:  Prior CT from 08/11/2018. FINDINGS: Brain: Examination moderately degraded by motion artifact and patient positioning. Moderately advanced cerebral atrophy with mild chronic small vessel ischemic disease. Superimposed remote lacunar infarct present within the left lentiform nucleus. No abnormal foci of restricted diffusion to suggest acute or subacute ischemia. Gray-white matter differentiation  maintained. No areas of remote cortical infarction identified. No acute or chronic intracranial hemorrhage. No mass lesion, midline shift or mass effect. No hydrocephalus. No extra-axial fluid collection. Vascular: Major intracranial vascular flow voids grossly maintained at the skull base. Skull and upper cervical spine: Craniocervical junction poorly evaluated on this exam due to motion. No obvious focal marrow replacing lesion. Scalp soft tissues unremarkable. Sinuses/Orbits: Patient status post ocular lens replacement bilaterally. Moderate mucosal thickening throughout the paranasal sinuses with complete opacification of the left maxillary and frontal sinuses. It appears mildly progressed relative to prior CT. Opacification of the left mastoid air cells and left middle ear cavity, also progressed from prior CT. No obvious abnormality at the left nasopharynx. Small right mastoid effusion noted as well. Other: None.  IMPRESSION: 1. Moderately degraded examination due to motion artifact. No definite acute intracranial abnormality identified. 2. Moderately advanced cerebral atrophy with mild chronic small vessel ischemic disease. 3. Progressive left greater than right paranasal sinus disease and left mastoid effusion. Electronically Signed   By: Rise Mu M.D.   On: 08/17/2018 17:33    Scheduled Meds: . aspirin EC  81 mg Oral Daily  . budesonide (PULMICORT) nebulizer solution  0.5 mg Nebulization BID  . cloNIDine  0.2 mg Transdermal Weekly  . enoxaparin (LOVENOX) injection  40 mg Subcutaneous Q24H  . famotidine  20 mg Oral Daily  . insulin aspart  0-15 Units Subcutaneous TID WC  . insulin aspart  0-5 Units Subcutaneous QHS  . ipratropium-albuterol  3 mL Nebulization BID  . irbesartan  150 mg Oral Daily  . ketotifen  1 drop Both Eyes BID  . mouth rinse  15 mL Mouth Rinse BID  . metoprolol tartrate  12.5 mg Oral BID  . multivitamin with minerals  1 tablet Oral Daily  . olopatadine  1 drop  Both Eyes Daily  . oxybutynin  5 mg Oral BID  . QUEtiapine  12.5 mg Oral QHS  . risperiDONE  0.5 mg Oral BID   Continuous Infusions: . sodium chloride Stopped (08/17/18 1556)    Assessment/Plan:  1. Acute delirium and underlying dementia.  Since the patient's mental status seems a little bit better today I will get rid of the standing dose Haldol and make it PRN.  Decrease the dose of Seroquel. 2. Acute on chronic hypercapnic respiratory failure, COPD.  Continue nebulizer treatments.  Patient on 2 L of oxygen 3. Pneumonia.  Finish the course of treatment 4. Hypertension on clonidine patch Avapro and metoprolol 5. Diabetes on sliding scale insulin 6. Incontinence get rid of oxybutynin 7. Weakness physical therapy evaluation  Code Status:     Code Status Orders  (From admission, onward)         Start     Ordered   08/11/18 2202  Do not attempt resuscitation (DNR)  Continuous    Question Answer Comment  In the event of cardiac or respiratory ARREST Do not call a "code blue"   In the event of cardiac or respiratory ARREST Do not perform Intubation, CPR, defibrillation or ACLS   In the event of cardiac or respiratory ARREST Use medication by any route, position, wound care, and other measures to relive pain and suffering. May use oxygen, suction and manual treatment of airway obstruction as needed for comfort.   Comments Nurse may pronounce      08/11/18 2201        Code Status History    Date Active Date Inactive Code Status Order ID Comments User Context   05/21/2017 2338 05/24/2017 2208 DNR [id number]  Oralia Manis, MD Inpatient    Advance Directive Documentation     Most Recent Value  Type of Advance Directive  Out of facility DNR (pink MOST or yellow form)  Pre-existing out of facility DNR order (yellow form or pink MOST form)  Yellow form placed in chart (order not valid for inpatient use)  "MOST" Form in Place?  -     Family Communication: Spoke with son on the  phone Disposition Plan: To be determined.  Patient is at [facility] assisted [facility]  Time spent: 28 minutes  Raquan Iannone [employer]

## 2018-08-19 NOTE — Evaluation (Signed)
Physical Therapy Evaluation Patient Details Name: Jimya Ciani MRN: [id number] DOB: 1932/01/04 Today's Date: 08/19/2018   History of Present Illness  84 yo female with onset of PNA sepsis and elevated troponin was admitted with delirium, from her prior living arrangment at ALF.  PMHx: neuropathy, vertigo, L hip fracture, DM, R humeral fracture  Clinical Impression  Pt is tolerating minimal work on bed mob and sitting balance, and noted she is using L UE preferentially.  Her prior level per son is to walk in ALF on RW, and was able to cover hallway distances with supervision.  Her need for O2 is new as well so will look for her to make progress and transition hopefully to SNF to recover her ability to assist standing and walking.  See acutely for same including encouraging OOB.    Follow Up Recommendations SNF    Equipment Recommendations  None recommended by PT    Recommendations for Other Services       Precautions / Restrictions Precautions Precautions: Fall(telemetry) Restrictions Weight Bearing Restrictions: No      Mobility  Bed Mobility Overal bed mobility: Needs Assistance Bed Mobility: Supine to Sit;Sit to Supine     Supine to sit: Max assist Sit to supine: Max assist   General bed mobility comments: significant help due to trunk and extremity weakness  Transfers                 General transfer comment: pt could not stand on command or with assistance  Ambulation/Gait             General Gait Details: nonambulatory  Stairs            Wheelchair Mobility    Modified Rankin (Stroke Patients Only)       Balance Overall balance assessment: Needs assistance Sitting-balance support: Feet supported;Bilateral upper extremity supported Sitting balance-Leahy Scale: Poor                                       Pertinent Vitals/Pain Pain Assessment: Faces Faces Pain Scale: No hurt    Home Living Family/patient expects  to be discharged to:: Assisted living Living Arrangements: Other (Comment)             Home Equipment: Dan Humphreys - 2 wheels;Shower seat - built in      Prior Function Level of Independence: Needs assistance   Gait / Transfers Assistance Needed: pt used RW with minimal supervision  ADL's / Homemaking Assistance Needed: ALF staff to assist her care with meds, meals and bathing/dressiing  Comments: pt was able to talk minimally with PT     Hand Dominance        Extremity/Trunk Assessment   Upper Extremity Assessment Upper Extremity Assessment: Generalized weakness    Lower Extremity Assessment Lower Extremity Assessment: Generalized weakness    Cervical / Trunk Assessment Cervical / Trunk Assessment: Kyphotic  Communication   Communication: Expressive difficulties  Cognition Arousal/Alertness: Lethargic Behavior During Therapy: Flat affect Overall Cognitive Status: History of cognitive impairments - at baseline                                        General Comments      Exercises     Assessment/Plan    PT Assessment Patient needs continued PT services  PT Problem List Decreased strength;Decreased range of motion;Decreased activity tolerance;Decreased balance;Decreased mobility;Decreased coordination;Decreased cognition;Decreased safety awareness;Cardiopulmonary status limiting activity;Obesity       PT Treatment Interventions DME instruction;Gait training;Functional mobility training;Therapeutic activities;Therapeutic exercise;Balance training;Neuromuscular re-education;Patient/family education    PT Goals (Current goals can be found in the Care Plan section)  Acute Rehab PT Goals Patient Stated Goal: none stated PT Goal Formulation: With family Time For Goal Achievement: 09/02/18 Potential to Achieve Goals: Fair    Frequency Min 2X/week   Barriers to discharge Other (comment)(not clear how much assistance ALF staff can give  her) limited staffing in ALF    Co-evaluation               AM-PAC PT "6 Clicks" Daily Activity  Outcome Measure Difficulty turning over in bed (including adjusting bedclothes, sheets and blankets)?: Unable Difficulty moving from lying on back to sitting on the side of the bed? : Unable Difficulty sitting down on and standing up from a chair with arms (e.g., wheelchair, bedside commode, etc,.)?: Unable Help needed moving to and from a bed to chair (including a wheelchair)?: Total Help needed walking in hospital room?: Total Help needed climbing 3-5 steps with a railing? : Total 6 Click Score: 6    End of Session   Activity Tolerance: Patient limited by fatigue Patient left: in bed;with call bell/phone within reach;with bed alarm set;with family/visitor present Nurse Communication: Mobility status PT Visit Diagnosis: Muscle weakness (generalized) (M62.81);Difficulty in walking, not elsewhere classified (R26.2);Adult, failure to thrive (R62.7)    Time: 9604-5409 PT Time Calculation (min) (ACUTE ONLY): 33 min   Charges:   PT Evaluation $PT Eval Moderate Complexity: 1 Mod PT Treatments $Therapeutic Activity: 8-22 mins        Ivar Drape 08/19/2018, 10:20 PM   Samul Dada, PT MS Acute Rehab Dept. Number: [facility] [id number] and [facility] [phone]

## 2018-08-20 ENCOUNTER — Inpatient Hospital Stay: Payer: Medicare Other

## 2018-08-20 LAB — GLUCOSE, CAPILLARY
Glucose-Capillary: 159 mg/dL — ABNORMAL HIGH (ref 70–99)
Glucose-Capillary: 182 mg/dL — ABNORMAL HIGH (ref 70–99)
Glucose-Capillary: 173 mg/dL — ABNORMAL HIGH (ref 70–99)
Glucose-Capillary: 120 mg/dL — ABNORMAL HIGH (ref 70–99)

## 2018-08-20 MED ORDER — SODIUM CHLORIDE 0.9 % IV SOLN
INTRAVENOUS | Status: DC
  Administered 2018-08-20 – 2018-08-21 (×2): via INTRAVENOUS

## 2018-08-20 MED ORDER — QUETIAPINE FUMARATE 25 MG PO TABS: 25.0000 mg | ORAL_TABLET | Freq: Every day | ORAL | Status: DC

## 2018-08-20 MED ORDER — QUETIAPINE FUMARATE 25 MG PO TABS
25.0000 mg | ORAL_TABLET | Freq: Every evening | ORAL | Status: DC | PRN
  Administered 2018-08-20 – 2018-08-24 (×2): 25 mg via ORAL
  Filled 2018-08-20 (×2): qty 1

## 2018-08-20 MED ORDER — LEVOFLOXACIN IN D5W 750 MG/150ML IV SOLN: 750.0000 mg | INTRAVENOUS | Status: DC

## 2018-08-20 MED ORDER — LEVOFLOXACIN IN D5W 500 MG/100ML IV SOLN
500.0000 mg | INTRAVENOUS | Status: DC
  Administered 2018-08-20 – 2018-08-23 (×3): 500 mg via INTRAVENOUS
  Filled 2018-08-20 (×5): qty 100

## 2018-08-20 NOTE — Progress Notes (Signed)
Patient ID: [name], female   DOB: [birth year], 84 y.o.   MRN: [id number]  [employer] PROGRESS NOTE  [name] WGN:[id number] DOB: [birth year] DOA: 08/11/2018 PCP: Lauro Regulus, MD  HPI/Subjective: Patient doing worse today than yesterday.  Has more of a cough and congestion.  Son states that she is been eating some applesauce and trying to drink some V8.  Objective: Vitals:   08/20/18 0725 08/20/18 0745  BP:  (!) 136/56  Pulse:  (!) 103  Resp:  16  Temp:  99 F (37.2 C)  SpO2: 90% 93%    Filed Weights   08/11/18 1254 08/13/18 1434  Weight: 84 kg 84.3 kg    ROS: Review of Systems  Unable to perform ROS: Mental status change  Respiratory: Positive for cough and shortness of breath.   Gastrointestinal: Negative for abdominal pain.  Musculoskeletal: Negative for joint pain.   Exam: Physical Exam  HENT:  Nose: No mucosal edema.  Mouth/Throat: No oropharyngeal exudate or posterior oropharyngeal edema.  Eyes: Pupils are equal, round, and reactive to light. Conjunctivae, EOM and lids are normal.  Neck: No JVD present. Carotid bruit is not present. No edema present. No thyroid mass and no thyromegaly present.  Cardiovascular: S1 normal and S2 normal. Exam reveals no gallop.  No murmur heard. Pulses:      Dorsalis pedis pulses are 2+ on the right side, and 2+ on the left side.  Respiratory: No respiratory distress. She has decreased breath sounds in the right lower field and the left lower field. She has no wheezes. She has rhonchi in the right lower field and the left lower field. She has no rales.  GI: Soft. Bowel sounds are normal. There is no tenderness.  Musculoskeletal:       Right ankle: She exhibits swelling.       Left ankle: She exhibits swelling.  Lymphadenopathy:    She has no cervical adenopathy.  Neurological: She is alert.  Patient follows some simple commands and able to straight leg raise.  Skin: Skin is warm. No rash noted. Nails  show no clubbing.  Psychiatric:  Patient answers some yes/no questions but does not elaborate      Data Reviewed: Basic Metabolic Panel: Recent Labs  Lab 08/14/18 1102 08/16/18 0638 08/17/18 0521 08/18/18 0453 08/19/18 0334  NA 145 143 144 144 144  K 4.2 3.5 3.1* 3.3* 3.6  CL 113* 105 107 104 105  CO2 27 27 27 27 31   GLUCOSE 173* 149* 149* 154* 159*  BUN 15 17 16 14 18   CREATININE 0.69 0.48 0.52 0.49 0.57  CALCIUM 8.8* 9.1 9.0 8.6* 8.9    Recent Labs  Lab 08/15/18 1149  AMMONIA 19   CBC: Recent Labs  Lab 08/16/18 0638 08/19/18 0334  WBC 13.1* 10.1  HGB 11.3* 11.0*  HCT 37.6 36.4  MCV 84.1 83.9  PLT 273 264   BNP (last 3 results) Recent Labs    08/11/18 1300  BNP 96.0    CBG: Recent Labs  Lab 08/19/18 1153 08/19/18 1723 08/19/18 2121 08/20/18 0801 08/20/18 1151  GLUCAP 168* 153* 137* 159* 182*    Recent Results (from the past 240 hour(s))  Culture, blood (routine x 2)     Status: None   Collection Time: 08/11/18  3:51 PM  Result Value Ref Range Status   Specimen Description BLOOD L AC  Final   Special Requests   Final    BOTTLES DRAWN AEROBIC AND ANAEROBIC  Blood Culture adequate volume   Culture   Final    NO GROWTH 5 DAYS Performed at [facility], [address] [place], [place] [address]    Report Status 08/16/2018 FINAL  Final  Culture, blood (routine x 2)     Status: None   Collection Time: 08/11/18  4:20 PM  Result Value Ref Range Status   Specimen Description BLOOD RFA  Final   Special Requests   Final    BOTTLES DRAWN AEROBIC AND ANAEROBIC Blood Culture results may not be optimal due to an inadequate volume of blood received in culture bottles   Culture   Final    NO GROWTH 5 DAYS Performed at [facility], [address] [place], [place] [address]    Report Status 08/16/2018 FINAL  Final  MRSA PCR Screening     Status: None   Collection Time: 08/11/18 11:58 PM  Result Value Ref Range Status   MRSA by  PCR NEGATIVE NEGATIVE Final    Comment:        The GeneXpert MRSA Assay (FDA approved for NASAL specimens only), is one component of a comprehensive MRSA colonization surveillance program. It is not intended to diagnose MRSA infection nor to guide or monitor treatment for MRSA infections. Performed at [facility], [address] [place], [place] [address]       Scheduled Meds: . aspirin EC  81 mg Oral Daily  . budesonide (PULMICORT) nebulizer solution  0.5 mg Nebulization BID  . enoxaparin (LOVENOX) injection  40 mg Subcutaneous Q24H  . famotidine  20 mg Oral Daily  . insulin aspart  0-15 Units Subcutaneous TID WC  . insulin aspart  0-5 Units Subcutaneous QHS  . ipratropium-albuterol  3 mL Nebulization BID  . irbesartan  150 mg Oral Daily  . ketotifen  1 drop Both Eyes BID  . mouth rinse  15 mL Mouth Rinse BID  . metoprolol tartrate  12.5 mg Oral BID  . multivitamin with minerals  1 tablet Oral Daily  . olopatadine  1 drop Both Eyes Daily  . QUEtiapine  25 mg Oral QHS   Continuous Infusions: . sodium chloride Stopped (08/17/18 1556)  . sodium chloride      Assessment/Plan:  1. Acute delirium and underlying dementia.  Mental status a little bit worse today.  Continue PRN Haldol.  Continue Seroquel and Risperdal. 2. Acute on chronic hypercapnic respiratory failure, COPD.  Continue nebulizer treatments.  Patient on 2 L of oxygen 3. Shortness of breath and cough.  Already had treatment for pneumonia here wondering if the patient aspirated again.  Repeat chest x-ray.  Continue nebulizer treatments.  Advised the son that the patient is on thickened liquids. 4. Hypertension.  Continue Avapro and metoprolol.  Discontinue clonidine patch 5. Diabetes on sliding scale insulin 6. Incontinence get rid of oxybutynin 7. Weakness physical therapy evaluation 8. Poor urine output.  Start gentle IV fluid hydration.  Send off a urine analysis  Code Status:     Code Status  Orders  (From admission, onward)         Start     Ordered   08/11/18 2202  Do not attempt resuscitation (DNR)  Continuous    Question Answer Comment  In the event of cardiac or respiratory ARREST Do not call a "code blue"   In the event of cardiac or respiratory ARREST Do not perform Intubation, CPR, defibrillation or ACLS   In the event of cardiac or respiratory  ARREST Use medication by any route, position, wound care, and other measures to relive pain and suffering. May use oxygen, suction and manual treatment of airway obstruction as needed for comfort.   Comments Nurse may pronounce      08/11/18 2201        Code Status History    Date Active Date Inactive Code Status Order ID Comments User Context   05/21/2017 2338 05/24/2017 2208 DNR [id number]  Oralia Manis, MD Inpatient    Advance Directive Documentation     Most Recent Value  Type of Advance Directive  Out of facility DNR (pink MOST or yellow form)  Pre-existing out of facility DNR order (yellow form or pink MOST form)  Yellow form placed in chart (order not valid for inpatient use)  "MOST" Form in Place?  -     Family Communication: Spoke with son at the bedside Disposition Plan: Will likely need a higher level of care.  Patient is at [facility] assisted [facility].  Time spent: 27 minutes  Kiefer Opheim [employer]

## 2018-08-20 NOTE — Clinical Social Work Note (Signed)
CSW attempted to contact the patient's son to discuss PT recommendation and discharge plans (SNF vs. HHPT at the ALF). The CSW left a HIPPA compliant voicemail and is waiting for a call back. CSW is following.  Argentina Ponder, MSW, Theresia Majors [phone]

## 2018-08-20 NOTE — Consult Note (Signed)
Pharmacy Antibiotic Note  [name] is a 84 y.o. female admitted on 08/11/2018 with  Pneumonia. Pharmacy has been consulted for Levofloxacin dosing. Patient has already been treated for PNA, but there is a new infiltrate on chest x-ray.  Plan: Levofloxacin 500 mg IV q24h ordered.  Height: 5\' 4"  (162.6 cm) Weight: 185 lb 13.6 oz (84.3 kg) IBW/kg (Calculated) : 54.7  Temp (24hrs), Avg:98.6 F (37 C), Min:98.1 F (36.7 C), Max:99 F (37.2 C)  Recent Labs  Lab 08/14/18 1102 08/16/18 0638 08/17/18 0521 08/18/18 0453 08/19/18 0334  WBC  --  13.1*  --   --  10.1  CREATININE 0.69 0.48 0.52 0.49 0.57    Estimated Creatinine Clearance: 55 mL/min (by C-G formula based on SCr of 0.57 mg/dL).    Allergies  Allergen Reactions  . Ampicillin     Zpac  . Sulfa Antibiotics     Antimicrobials this admission: 10/4 Aztreonam >> 10/5 10/5 Cefepime >> 10/10 10/4 Levofloxacin x 1, 10/13 >>   Microbiology results: 10/4 BCx: NG x 5 days  10/4 MRSA PCR: (-)  Thank you for allowing pharmacy to be a part of this patient's care.  Mauri Reading 08/20/2018 4:49 PM

## 2018-08-20 NOTE — Plan of Care (Signed)
  Problem: Education: Goal: Knowledge of General Education information will improve Description Including pain rating scale, medication(s)/side effects and non-pharmacologic comfort measures Outcome: Progressing   Problem: Clinical Measurements: Goal: Respiratory complications will improve Outcome: Progressing Goal: Cardiovascular complication will be avoided Outcome: Progressing   Problem: Activity: Goal: Risk for activity intolerance will decrease Outcome: Progressing   Problem: Nutrition: Goal: Adequate nutrition will be maintained Outcome: Progressing   Problem: Elimination: Goal: Will not experience complications related to bowel motility Outcome: Progressing Goal: Will not experience complications related to urinary retention Outcome: Progressing   Problem: Safety: Goal: Ability to remain free from injury will improve Outcome: Progressing   Problem: Skin Integrity: Goal: Risk for impaired skin integrity will decrease Outcome: Progressing

## 2018-08-20 NOTE — Progress Notes (Signed)
There is new infiltrate on Xray chest. As Dr. Renae Gloss had suggested, will add levaquin.

## 2018-08-20 NOTE — Progress Notes (Signed)
Patient ID: [name], female   DOB: 12-Oct-1932, 84 y.o.   MRN: [id number]  ACP note  Patient unable to participate in conversation  Diagnosis: Acute delirium on underlying dementia, acute on chronic hypercapnic respiratory failure, COPD, shortness of breath and cough, hypertension, diabetes, incontinence, weakness  Patient is a DNR.  Plan.  I am very concerned about the patient's mental status and her diet and what she is able to take in.  Everything will key off the mental status if her mental status does not improve her overall prognosis will be very poor.   Time spent on ACP discussion 17 minutes Dr. Alford Highland

## 2018-08-20 NOTE — Consult Note (Signed)
[facility] Consult   Reason for Consult: Consult for 84 year old woman with delirium Referring Physician: Verdell Carmine Patient Identification: [name] MRN:  [id number] Principal Diagnosis: Acute delirium Diagnosis:   Patient Active Problem List   Diagnosis Date Noted  . Acute delirium [R41.0] 08/16/2018  . Dementia with behavioral disturbance ([place]) [F03.91] 08/16/2018  . Encephalopathy acute [G93.40] 08/11/2018  . Sepsis ([place]) [A41.9] 05/21/2017  . Aspiration pneumonia ([place]) [J69.0] 05/21/2017  . GERD (gastroesophageal reflux disease) [K21.9] 05/21/2017  . HLD (hyperlipidemia) [E78.5] 05/21/2017  . Depression [F32.9] 05/21/2017  . Anxiety [F41.9] 05/21/2017  . Elevated troponin [R79.89] 05/21/2017  . Diabetes ([place]) [E11.9] 05/21/2017  . Right humeral fracture [S42.301A] 05/21/2017  . Nausea and vomiting [R11.2] 03/31/2015  . Microcytic anemia [D50.9] 03/31/2015    Total Time spent with patient: 15 minutes  Subjective:   [name] is a 84 y.o. female patient  with multiple medical the medical floor secondary to altered mental status and pneumonia.  Initially the patient was very agitated several days ago but now is much calmer.  She is not able to communicate with me.  She stares at this writer and mumbles words here and there but no logical sentences or coherent speech.  Her son was not in the room to provide any collateral information.  Per nursing, she has not received any PRN's of Haldol or Risperdal.  She has been fairly calm and cooperative.  Past Psychiatric History: Only history of established dementia although it sounds like as recently as a month ago she had quality of life and some appropriate interaction.  No other serious psychiatric history identified  Social History: She resides in a nursing facility. Son is at bedside and supportive   Risk to Self:  Yes Risk to Others:  No Prior Inpatient Therapy:   Unknown at this time Prior Outpatient  Therapy:  Unknown at this time  Past Medical History:  Past Medical History:  Diagnosis Date  . Anxiety   . Depression   . Diabetes mellitus without complication ([place])   . GERD (gastroesophageal reflux disease)   . Hip fracture ([place])   . Hyperlipemia   . Neuropathy   . Vertigo     Past Surgical History:  Procedure Laterality Date  . ABDOMINAL HYSTERECTOMY    . APPENDECTOMY    . BACK SURGERY     tumor removal bengin  . CHOLECYSTECTOMY     Family History:  Family History  Problem Relation Age of Onset  . Diabetes Mother   . Diabetes Sister        x2  . Breast cancer Sister   . Heart disease Father   . Heart disease Brother        x3  . Bladder Cancer Neg Hx   . Kidney cancer Neg Hx   . Prostate cancer Neg Hx    Family Psychiatric  History: Unknown Social History:  Social History   Substance and Sexual Activity  Alcohol Use No  . Alcohol/week: 0.0 standard drinks     Social History   Substance and Sexual Activity  Drug Use No    Social History   Socioeconomic History  . Marital status: Widowed    Spouse name: Not on file  . Number of children: Not on file  . Years of education: Not on file  . Highest education level: Not on file  Occupational History  . Occupation: Retired  Scientific laboratory technician  . Financial resource strain: Not on file  .  Food insecurity:    Worry: Not on file    Inability: Not on file  . Transportation needs:    Medical: Not on file    Non-medical: Not on file  Tobacco Use  . Smoking status: Never Smoker  . Smokeless tobacco: Never Used  Substance and Sexual Activity  . Alcohol use: No    Alcohol/week: 0.0 standard drinks  . Drug use: No  . Sexual activity: Not on file  Lifestyle  . Physical activity:    Days per week: Not on file    Minutes per session: Not on file  . Stress: Not on file  Relationships  . Social connections:    Talks on phone: Not on file    Gets together: Not on file    Attends religious service: Not on file     Active member of club or organization: Not on file    Attends meetings of clubs or organizations: Not on file    Relationship status: Not on file  Other Topics Concern  . Not on file  Social History Narrative  . Not on file   Additional Social History:    Allergies:   Allergies  Allergen Reactions  . Ampicillin     Zpac  . Sulfa Antibiotics     Labs:  Results for orders placed or performed during the hospital encounter of 08/11/18 (from the past 48 hour(s))  Glucose, capillary     Status: Abnormal   Collection Time: 08/18/18  4:48 PM  Result Value Ref Range   Glucose-Capillary 191 (H) 70 - 99 mg/dL  Glucose, capillary     Status: Abnormal   Collection Time: 08/18/18  9:42 PM  Result Value Ref Range   Glucose-Capillary 148 (H) 70 - 99 mg/dL  CBC     Status: Abnormal   Collection Time: 08/19/18  3:34 AM  Result Value Ref Range   WBC 10.1 4.0 - 10.5 K/uL   RBC 4.34 3.87 - 5.11 MIL/uL   Hemoglobin 11.0 (L) 12.0 - 15.0 g/dL   HCT 36.4 36.0 - 46.0 %   MCV 83.9 80.0 - 100.0 fL   MCH 25.3 (L) 26.0 - 34.0 pg   MCHC 30.2 30.0 - 36.0 g/dL   RDW 13.8 11.5 - 15.5 %   Platelets 264 150 - 400 K/uL   nRBC 0.0 0.0 - 0.2 %    Comment: Performed at [facility], [address] [place], [place] [address]  Basic metabolic panel     Status: Abnormal   Collection Time: 08/19/18  3:34 AM  Result Value Ref Range   Sodium 144 135 - 145 mmol/L   Potassium 3.6 3.5 - 5.1 mmol/L   Chloride 105 98 - 111 mmol/L   CO2 31 22 - 32 mmol/L   Glucose, Bld 159 (H) 70 - 99 mg/dL   BUN 18 8 - 23 mg/dL   Creatinine, Ser 0.57 0.44 - 1.00 mg/dL   Calcium 8.9 8.9 - 10.3 mg/dL   GFR calc non Af Amer >60 >60 mL/min   GFR calc Af Amer >60 >60 mL/min    Comment: (NOTE) The eGFR has been calculated using the CKD EPI equation. This calculation has not been validated in all clinical situations. eGFR's persistently <60 mL/min signify possible Chronic Kidney Disease.    Anion gap 8 5 - 15     Comment: Performed at [facility], [place], [place] [address]  Glucose, capillary     Status: Abnormal  Collection Time: 08/19/18  9:39 AM  Result Value Ref Range   Glucose-Capillary 163 (H) 70 - 99 mg/dL  Glucose, capillary     Status: Abnormal   Collection Time: 08/19/18 11:53 AM  Result Value Ref Range   Glucose-Capillary 168 (H) 70 - 99 mg/dL  Glucose, capillary     Status: Abnormal   Collection Time: 08/19/18  5:23 PM  Result Value Ref Range   Glucose-Capillary 153 (H) 70 - 99 mg/dL  Glucose, capillary     Status: Abnormal   Collection Time: 08/19/18  9:21 PM  Result Value Ref Range   Glucose-Capillary 137 (H) 70 - 99 mg/dL  Glucose, capillary     Status: Abnormal   Collection Time: 08/20/18  8:01 AM  Result Value Ref Range   Glucose-Capillary 159 (H) 70 - 99 mg/dL  Glucose, capillary     Status: Abnormal   Collection Time: 08/20/18 11:51 AM  Result Value Ref Range   Glucose-Capillary 182 (H) 70 - 99 mg/dL    Current Facility-Administered Medications  Medication Dose Route Frequency Provider Last Rate Last Dose  . 0.9 %  sodium chloride infusion   Intravenous PRN Tukov-Yual, Arlyss Gandy, NP   Stopped at 08/17/18 1556  . 0.9 %  sodium chloride infusion   Intravenous Continuous Loletha Grayer, MD 50 mL/hr at 08/20/18 1529    . acetaminophen (TYLENOL) tablet 650 mg  650 mg Oral Q6H PRN Lance Coon, MD   650 mg at 08/20/18 0859  . aspirin EC tablet 81 mg  81 mg Oral Daily Salary, Montell D, MD   81 mg at 08/20/18 0859  . budesonide (PULMICORT) nebulizer solution 0.5 mg  0.5 mg Nebulization BID Salary, Montell D, MD   0.5 mg at 08/20/18 0725  . calcium carbonate (TUMS - dosed in mg elemental calcium) chewable tablet 400 mg of elemental calcium  400 mg of elemental calcium Oral TID PRN Henreitta Leber, MD   400 mg of elemental calcium at 08/13/18 0014  . enoxaparin (LOVENOX) injection 40 mg  40 mg Subcutaneous Q24H Salary, Montell D, MD   40 mg at  08/19/18 2308  . famotidine (PEPCID) tablet 20 mg  20 mg Oral Daily Charlett Nose, RPH   20 mg at 08/20/18 0859  . haloperidol lactate (HALDOL) injection 0.5 mg  0.5 mg Intravenous Q6H PRN Loletha Grayer, MD      . hydrALAZINE (APRESOLINE) injection 10 mg  10 mg Intravenous Q6H PRN Gladstone Lighter, MD   10 mg at 08/18/18 0457  . insulin aspart (novoLOG) injection 0-15 Units  0-15 Units Subcutaneous TID WC Gorden Harms, MD   3 Units at 08/20/18 0857  . insulin aspart (novoLOG) injection 0-5 Units  0-5 Units Subcutaneous QHS Loney Hering D, MD   3 Units at 08/15/18 2239  . ipratropium-albuterol (DUONEB) 0.5-2.5 (3) MG/3ML nebulizer solution 3 mL  3 mL Nebulization BID Henreitta Leber, MD   3 mL at 08/20/18 0725  . irbesartan (AVAPRO) tablet 150 mg  150 mg Oral Daily Henreitta Leber, MD   150 mg at 08/20/18 0859  . ketotifen (ZADITOR) 0.025 % ophthalmic solution 1 drop  1 drop Both Eyes BID Salary, Montell D, MD   1 drop at 08/19/18 2310  . MEDLINE mouth rinse  15 mL Mouth Rinse BID Henreitta Leber, MD   15 mL at 08/20/18 0907  . metoprolol tartrate (LOPRESSOR) tablet 12.5 mg  12.5 mg Oral BID Harrie Foreman, MD  12.5 mg at 08/20/18 0859  . multivitamin with minerals tablet 1 tablet  1 tablet Oral Daily Henreitta Leber, MD   1 tablet at 08/20/18 0859  . olopatadine (PATANOL) 0.1 % ophthalmic solution 1 drop  1 drop Both Eyes Daily Salary, Montell D, MD   1 drop at 08/20/18 0907  . ondansetron (ZOFRAN) injection 4 mg  4 mg Intravenous Q6H PRN Lance Coon, MD   4 mg at 08/13/18 0128  . QUEtiapine (SEROQUEL) tablet 25 mg  25 mg Oral QHS PRN Chauncey Mann, MD        Musculoskeletal: Strength & Muscle Tone: decreased Gait & Station: unable to stand Patient leans: N/A  Psychiatric Specialty Exam: Physical Exam  Nursing note and vitals reviewed.   Review of Systems  Unable to perform ROS: Mental status change    Blood pressure (!) 122/53, pulse 95, temperature 99 F  (37.2 C), temperature source Oral, resp. rate 16, height '5\' 4"'  (1.626 m), weight 84.3 kg, SpO2 92 %.Body mass index is 31.9 kg/m.  General Appearance: Casual  Eye Contact:  Eyes closed and barely open when questioned  Speech:  Incoherent  Volume:  Mumbled speech  Mood: Unable to answer  Affect:  Flat  Thought Process:  Unable to assess, sedated  Orientation: Unable to assess  Thought Content:  Unable to assess  Suicidal Thoughts: Not verbalized  Homicidal Thoughts: Not verbalized  Memory:  Unable to assess  Judgement:  Unable to assess  Insight:  Unable to assess  Psychomotor Activity:  Negative  Concentration:  Concentration: Negative  Recall:  Negative  Fund of Knowledge:  Negative  Language:  Negative  Akathisia:  Negative  Handed:  Right  AIMS (if indicated):     Assets:  Social Support  ADL's:  Impaired  Cognition:  Impaired,  Severe  Sleep:        Treatment Plan Summary:   Ms. Fischman is a 84 year old female with multiple medical problems admitted to the inpatient medical floor with pneumonia and altered mental status.  She was having problems with delirium and agitation.  She was put on several psychotropic medications with improvement.  Delirium - Will change Risperdal M-Tab and Haldol IV to PRN and use Seroquel 12.55m po nightly PRN ONLY since she is so sedated -She is much calmer and no agitation -Pneumonia is most likely underlying contributing factor to altered mental status -MRI Head showed moderately advanced cerebral atrophy and chronic small vessel ischemic disease -We will continue to follow -No need for inpatient psychiatric hospitalization at this time    Disposition: No evidence of imminent risk to self or others at present.   Patient does not meet criteria for psychiatric inpatient admission.  AChauncey Mann MD 08/20/2018 3:33 PM

## 2018-08-21 LAB — GLUCOSE, CAPILLARY
GLUCOSE-CAPILLARY: 138 mg/dL — AB (ref 70–99)
GLUCOSE-CAPILLARY: 166 mg/dL — AB (ref 70–99)
GLUCOSE-CAPILLARY: 105 mg/dL — AB (ref 70–99)
Glucose-Capillary: 159 mg/dL — ABNORMAL HIGH (ref 70–99)

## 2018-08-21 NOTE — Care Management Important Message (Signed)
Copy of signed IM left with patient in room.  

## 2018-08-21 NOTE — Progress Notes (Signed)
Nutrition Follow-up  DOCUMENTATION CODES:   Obesity unspecified  INTERVENTION:  Continue Hormel Shake (Vital Cuisine) po TID with trays, each supplement provides 520 kcal and 22 grams of protein.  Continue Magic cup TID with meals, each supplement provides 290 kcal and 9 grams of protein.  Continue daily MVI.  NUTRITION DIAGNOSIS:   Inadequate oral intake related to lethargy/confusion as evidenced by meal completion < 50%.  Ongoing - meal completion remains <50% but is improving.  GOAL:   Patient will meet greater than or equal to 90% of their needs  Progressing.  MONITOR:   PO intake, Supplement acceptance, Diet advancement, Labs, Weight trends, I & O's, Skin  REASON FOR ASSESSMENT:   Consult Assessment of nutrition requirement/status  ASSESSMENT:   84 year old female with PMHx of depression, anxiety, GERD, vertigo, DM, neuropathy who is admitted with AMS, PNA, mild acute exacerbation of COPD.  Met with patient and her daughter-in-law in room today. Patient is much more alert and able to answer questions. She reports her appetite is improving. She had a good appetite and intake PTA per her report. Today at lunch she tried the pureed vegetables, pureed meat, yogurt, and pudding on tray. She did not receive the protein supplements on her tray that had been ordered by RD so will look into this. She ended up finishing about 40% of her meal, which is an improvement over last week.  Medications reviewed and include: famotidine, Novolog 0-15 units TID, Novolog 0-5 units QHS, MVI daily, Levaquin.  Labs reviewed: CBG 138-173.  Diet Order:   Diet Order            DIET - DYS 1 Room service appropriate? Yes with Assist; Fluid consistency: Nectar Thick  Diet effective now              EDUCATION NEEDS:   Not appropriate for education at this time  Skin:  Skin Assessment: Skin Integrity Issues:(MSAD to ankle, breast, and groin)  Last BM:  08/12/2018 - small type  6  Height:   Ht Readings from Last 1 Encounters:  08/13/18 _0  (1.626 m)    Weight:   Wt Readings from Last 1 Encounters:  08/13/18 84.3 kg    Ideal Body Weight:  57.95 kg(calculated from height of 5' 5.5" from previous encounter)  BMI:  Body mass index is 31.9 kg/m.  Estimated Nutritional Needs:   Kcal:  1575-1830 (MSJ x 1.2-1.4)  Protein:  85-100 grams (1-1.2 grams/kg)  Fluid:  1.5 L/day (25 mL/kg IBW)  Willey Blade, MS, RD, LDN Office: [phone] Pager: [phone] After Hours/Weekend Pager: [phone]

## 2018-08-21 NOTE — Progress Notes (Signed)
Patient ID: [name], female   DOB: July 07, 1932, 84 y.o.   MRN: [id number]  [facility] PROGRESS NOTE  [name] MVH:[id number] DOB: 1932-01-26 DOA: 08/11/2018 PCP: Jessica Regulus, MD  HPI/Subjective: Patient more talkative today.  She states she feels okay.  She does not complain of shortness of breath.  Objective: Vitals:   08/21/18 0815 08/21/18 1315  BP: (!) 131/59 (!) 128/55  Pulse: 91 81  Resp:  20  Temp: 98.4 F (36.9 C) 98.4 F (36.9 C)  SpO2: 92% 97%    Filed Weights   08/11/18 1254 08/13/18 1434  Weight: 84 kg 84.3 kg    ROS: Review of Systems  Unable to perform ROS: Mental status change  Respiratory: Positive for cough and shortness of breath.   Cardiovascular: Negative for chest pain.  Gastrointestinal: Negative for abdominal pain.  Musculoskeletal: Negative for joint pain.   Exam: Physical Exam  HENT:  Nose: No mucosal edema.  Mouth/Throat: No oropharyngeal exudate or posterior oropharyngeal edema.  Eyes: Pupils are equal, round, and reactive to light. Conjunctivae, EOM and lids are normal.  Neck: No JVD present. Carotid bruit is not present. No edema present. No thyroid mass and no thyromegaly present.  Cardiovascular: S1 normal and S2 normal. Exam reveals no gallop.  No murmur heard. Pulses:      Dorsalis pedis pulses are 2+ on the right side, and 2+ on the left side.  Respiratory: No respiratory distress. She has decreased breath sounds in the right lower field and the left lower field. She has no wheezes. She has rhonchi in the right lower field and the left lower field. She has no rales.  GI: Soft. Bowel sounds are normal. There is no tenderness.  Musculoskeletal:       Right ankle: She exhibits swelling.       Left ankle: She exhibits swelling.  Lymphadenopathy:    She has no cervical adenopathy.  Neurological: She is alert.  Patient follows some simple commands and able to straight leg raise.  Skin: Skin is warm. No rash  noted. Nails show no clubbing.  Psychiatric:  Patient answers some yes/no questions but does not elaborate      Data Reviewed: Basic Metabolic Panel: Recent Labs  Lab 08/16/18 0638 08/17/18 0521 08/18/18 0453 08/19/18 0334  NA 143 144 144 144  K 3.5 3.1* 3.3* 3.6  CL 105 107 104 105  CO2 27 27 27 31   GLUCOSE 149* 149* 154* 159*  BUN 17 16 14 18   CREATININE 0.48 0.52 0.49 0.57  CALCIUM 9.1 9.0 8.6* 8.9    Recent Labs  Lab 08/15/18 1149  AMMONIA 19   CBC: Recent Labs  Lab 08/16/18 0638 08/19/18 0334  WBC 13.1* 10.1  HGB 11.3* 11.0*  HCT 37.6 36.4  MCV 84.1 83.9  PLT 273 264   BNP (last 3 results) Recent Labs    08/11/18 1300  BNP 96.0    CBG: Recent Labs  Lab 08/20/18 1151 08/20/18 1700 08/20/18 2115 08/21/18 0746 08/21/18 1142  GLUCAP 182* 173* 120* 159* 138*    Recent Results (from the past 240 hour(s))  Culture, blood (routine x 2)     Status: None   Collection Time: 08/11/18  3:51 PM  Result Value Ref Range Status   Specimen Description BLOOD L AC  Final   Special Requests   Final    BOTTLES DRAWN AEROBIC AND ANAEROBIC Blood Culture adequate volume   Culture   Final  NO GROWTH 5 DAYS Performed at [facility], [address] [place], [place] [address]    Report Status 08/16/2018 FINAL  Final  Culture, blood (routine x 2)     Status: None   Collection Time: 08/11/18  4:20 PM  Result Value Ref Range Status   Specimen Description BLOOD RFA  Final   Special Requests   Final    BOTTLES DRAWN AEROBIC AND ANAEROBIC Blood Culture results may not be optimal due to an inadequate volume of blood received in culture bottles   Culture   Final    NO GROWTH 5 DAYS Performed at [facility], [address] [place], [place] [address]    Report Status 08/16/2018 FINAL  Final  MRSA PCR Screening     Status: None   Collection Time: 08/11/18 11:58 PM  Result Value Ref Range Status   MRSA by PCR NEGATIVE NEGATIVE Final     Comment:        The GeneXpert MRSA Assay (FDA approved for NASAL specimens only), is one component of a comprehensive MRSA colonization surveillance program. It is not intended to diagnose MRSA infection nor to guide or monitor treatment for MRSA infections. Performed at [facility], [address] [place], [place] [address]       Scheduled Meds: . aspirin EC  81 mg Oral Daily  . budesonide (PULMICORT) nebulizer solution  0.5 mg Nebulization BID  . enoxaparin (LOVENOX) injection  40 mg Subcutaneous Q24H  . famotidine  20 mg Oral Daily  . insulin aspart  0-15 Units Subcutaneous TID WC  . insulin aspart  0-5 Units Subcutaneous QHS  . ipratropium-albuterol  3 mL Nebulization BID  . irbesartan  150 mg Oral Daily  . ketotifen  1 drop Both Eyes BID  . mouth rinse  15 mL Mouth Rinse BID  . metoprolol tartrate  12.5 mg Oral BID  . multivitamin with minerals  1 tablet Oral Daily  . olopatadine  1 drop Both Eyes Daily   Continuous Infusions: . sodium chloride Stopped (08/17/18 1556)  . sodium chloride 50 mL/hr at 08/21/18 1254  . levofloxacin (LEVAQUIN) IV 500 mg (08/20/18 1821)    Assessment/Plan:  1. Acute delirium and underlying dementia.  Mental status much improved today after starting antibiotics for another aspiration pneumonia, starting IV fluids and discontinuing psychiatric medications. 2. Aspiration pneumonia.  Patient started on Levaquin yesterday evening.  Trying to obtain an EKG to evaluate QTc interval and may have to change antibiotic depending on the QTC. 3. Acute on chronic hypercapnic respiratory failure, COPD.  Continue nebulizer treatments.  Patient on 2 L of oxygen 4. Hypertension.  Continue Avapro and metoprolol.  Discontinued clonidine patch 5. Diabetes on sliding scale insulin 6. Incontinence get rid of oxybutynin 7. Weakness physical therapy evaluation  Code Status:     Code Status Orders  (From admission, onward)         Start      Ordered   08/11/18 2202  Do not attempt resuscitation (DNR)  Continuous    Question Answer Comment  In the event of cardiac or respiratory ARREST Do not call a "code blue"   In the event of cardiac or respiratory ARREST Do not perform Intubation, CPR, defibrillation or ACLS   In the event of cardiac or respiratory ARREST Use medication by any route, position, wound care, and other measures to relive pain and suffering. May use oxygen, suction and manual treatment of airway obstruction as needed for  comfort.   Comments Nurse may pronounce      08/11/18 2201        Code Status History    Date Active Date Inactive Code Status Order ID Comments User Context   05/21/2017 2338 05/24/2017 2208 DNR [id number]  Oralia Manis, MD Inpatient    Advance Directive Documentation     Most Recent Value  Type of Advance Directive  Out of facility DNR (pink MOST or yellow form)  Pre-existing out of facility DNR order (yellow form or pink MOST form)  Yellow form placed in chart (order not valid for inpatient use)  "MOST" Form in Place?  -     Family Communication: Spoke with son at the bedside yesterday Disposition Plan: Patient will need a higher level of care social worker trying to contact son about a new facility.  Time spent: 26 minutes  Jasiya Markie [employer]

## 2018-08-21 NOTE — Clinical Social Work Note (Signed)
CSW attempted to contact patient's son Dave Mannes [phone] to discuss discharge disposition. CSW left a voicemail for [name] and is waiting for a return call. CSW will continue to follow.    Ruthe Mannan MSW, [address] [phone]

## 2018-08-21 NOTE — Progress Notes (Signed)
Physical Therapy Treatment Patient Details Name: [name] MRN: [id number] DOB: November 22, 1931 Today's Date: 08/21/2018    History of Present Illness 84 yo female with onset of PNA sepsis and elevated troponin was admitted with delirium, from her prior living arrangment at ALF.  PMHx: neuropathy, vertigo, L hip fracture, DM, R humeral fracture    PT Comments    Pt agreeable to PT; complains of L ear pain. Nursing notified of pain. Pt participates with supine bed exercises with assist as needed. Demonstrates significant weakness throughout; fatigues easily. No further mobility attempts; nursing in for EKG testing. Family given written HEP. Continue PT to progress strength, endurance to improve all functional mobility.    Follow Up Recommendations  SNF     Equipment Recommendations  None recommended by PT    Recommendations for Other Services       Precautions / Restrictions Precautions Precautions: Fall Restrictions Weight Bearing Restrictions: No    Mobility  Bed Mobility               General bed mobility comments: Not tested; post exercises nursing staff in for EKG  Transfers                    Ambulation/Gait                 Stairs             Wheelchair Mobility    Modified Rankin (Stroke Patients Only)       Balance                                            Cognition Arousal/Alertness: Lethargic(awake, but states fatigued) Behavior During Therapy: Anxious;WFL for tasks assessed/performed(mildly anxious) Overall Cognitive Status: History of cognitive impairments - at baseline                                        Exercises General Exercises - Lower Extremity Ankle Circles/Pumps: AAROM;Both;15 reps;Supine Quad Sets: Strengthening;5 reps;Both;Supine(difficulty straightening; curls back to bent knee position) Gluteal Sets: Other (comment)(attempted; does not follow) Short Arc Quad:  AAROM;Both;15 reps;Supine Heel Slides: AAROM;AROM;Both;15 reps;Supine(more assist to straighten) Hip ABduction/ADduction: AAROM;Both;15 reps;Supine Straight Leg Raises: AAROM;Both;5 reps;Supine    General Comments        Pertinent Vitals/Pain Pain Assessment: Faces Faces Pain Scale: Hurts a little bit Pain Location: L ear    Home Living                      Prior Function            PT Goals (current goals can now be found in the care plan section) Progress towards PT goals: Not progressing toward goals - comment    Frequency    Min 2X/week      PT Plan Current plan remains appropriate    Co-evaluation              AM-PAC PT "6 Clicks" Daily Activity  Outcome Measure  Difficulty turning over in bed (including adjusting bedclothes, sheets and blankets)?: Unable Difficulty moving from lying on back to sitting on the side of the bed? : Unable Difficulty sitting down on and standing up from a chair with arms (e.g., wheelchair, bedside commode, etc,.)?:  Unable Help needed moving to and from a bed to chair (including a wheelchair)?: Total Help needed walking in hospital room?: Total Help needed climbing 3-5 steps with a railing? : Total 6 Click Score: 6    End of Session Equipment Utilized During Treatment: Oxygen Activity Tolerance: Patient limited by fatigue Patient left: in bed;with call bell/phone within reach;with bed alarm set;with nursing/sitter in room Nurse Communication: Other (comment)(c/o ear pain) PT Visit Diagnosis: Muscle weakness (generalized) (M62.81);Difficulty in walking, not elsewhere classified (R26.2);Adult, failure to thrive (R62.7)     Time: 8657-8469 PT Time Calculation (min) (ACUTE ONLY): 20 min  Charges:  $Therapeutic Exercise: 8-22 mins                      Scot Dock, PTA 08/21/2018, 2:44 PM

## 2018-08-21 NOTE — Progress Notes (Signed)
  Speech Language Pathology Treatment: Dysphagia  Patient Details Name: [name] MRN: [id number] DOB: 10-Sep-1932 Today's Date: 08/21/2018 Time: 4098-1191 SLP Time Calculation (min) (ACUTE ONLY): 25 min  Assessment / Plan / Recommendation Clinical Impression  Reviewed chart, spoke with nursing who reported toleration of current Dysphagia I diet with Nectar thick liquids. NT who fed patient this morning reports poor PO intake, taking a combined total of 8 tsp's magic cup and puree this am at breakfast. MD notes new infiltrate on chest xray on 10/13. Performed dysphagia treatment and PO trials due to presence of new infiltrate. Patient appears to tolerate tsp's nectar thick liquid and puree at bedside with no overt s/s aspiration. Noted oral phase deficits c/b mod decreased oral prep and control of puree and nectar thick liquids by tsp. Noted presence of a box of straws in patient's room. Recommend Nectar thick liquids be given by TSP only due to risk of increased aspiration due to decreased oral control and likely posterior spillage into airway prior to initiation of swallow with larger boluses, ie by straw. Rec continue with Dysphagia I diet with nectar thick liquids, given by TSP only, NO STRAWS due to increased risk of aspiration. SLP to f/u with toleration of diet. Discussed change of NO STRAWS with nursing.   HPI HPI: Pt is a 84 y.o. female with a known history which includes Dementia per chart, GERD, vertigo, hip fx w/ repair, anxiety/depression, DM, neuropathy presenting from nursing facility with altered mental status, found to be hypoxic on presentation to the emergency room, patient had ? Flu a week ago, had not been feeling well during this period of time, had cough, in the emergency room patient was unresponsive, patient was found to have acute hypoxic hypercarbic respiratory failure on blood gas-started on BiPAP, CT head noted for extensive sinus disease. CT of the chest noted for  Bronchitic changes with scattered Atelectasis in the lower lobes, right-sided pneumonia, marked enlargement of the Left thyroid lobe. Patient admitted for acute toxic metabolic encephalopathy due to acute pneumonia and polypharmacy.  Today, pt is alert, talkative w/ declined Cognition noted during verbal engagement. Pt follows instruction given Mod verbal/tactile cues; easily distracted also.  Noted Edentulous at baseline.       SLP Plan  Continue with current plan of care       Recommendations  Diet recommendations: Dysphagia 1 (puree);Nectar-thick liquid Liquids provided via: Teaspoon Medication Administration: Crushed with puree Supervision: Trained caregiver to feed patient Compensations: Minimize environmental distractions;Slow rate;Small sips/bites Postural Changes and/or Swallow Maneuvers: Seated upright 90 degrees;Upright 30-60 min after meal                Oral Care Recommendations: Oral care BID Follow up Recommendations: Skilled Nursing facility SLP Visit Diagnosis: Dysphagia, oropharyngeal phase (R13.12) Plan: Continue with current plan of care       GO                Jessica Klayman, MA, CCC-SLP 08/21/2018, 9:35 AM

## 2018-08-22 LAB — BASIC METABOLIC PANEL
Anion gap: 7 (ref 5–15)
BUN: 19 mg/dL (ref 8–23)
CO2: 29 mmol/L (ref 22–32)
Calcium: 8.8 mg/dL — ABNORMAL LOW (ref 8.9–10.3)
Chloride: 107 mmol/L (ref 98–111)
Creatinine, Ser: 0.54 mg/dL (ref 0.44–1.00)
GFR calc Af Amer: >60 mL/min (ref >60)
GLUCOSE: 181 mg/dL — AB (ref 70–99)
POTASSIUM: 3.9 mmol/L (ref 3.5–5.1)
Sodium: 143 mmol/L (ref 135–145)

## 2018-08-22 LAB — GLUCOSE, CAPILLARY
GLUCOSE-CAPILLARY: 151 mg/dL — AB (ref 70–99)
GLUCOSE-CAPILLARY: 124 mg/dL — AB (ref 70–99)
GLUCOSE-CAPILLARY: 133 mg/dL — AB (ref 70–99)
GLUCOSE-CAPILLARY: 189 mg/dL — AB (ref 70–99)

## 2018-08-22 LAB — CBC
HCT: 36.7 % (ref 36.0–46.0)
HEMOGLOBIN: 10.6 g/dL — AB (ref 12.0–15.0)
MCH: 25.4 pg — AB (ref 26.0–34.0)
MCHC: 28.9 g/dL — ABNORMAL LOW (ref 30.0–36.0)
MCV: 88 fL (ref 80.0–100.0)
Platelets: 267 10*3/uL (ref 150–400)
RBC: 4.17 MIL/uL (ref 3.87–5.11)
RDW: 14.2 % (ref 11.5–15.5)
WBC: 10.8 10*3/uL — ABNORMAL HIGH (ref 4.0–10.5)
nRBC: 0 % (ref 0.0–0.2)

## 2018-08-22 MED ORDER — METOPROLOL TARTRATE 25 MG PO TABS
25.0000 mg | ORAL_TABLET | Freq: Two times a day (BID) | ORAL | Status: DC
  Administered 2018-08-22 – 2018-08-24 (×4): 25 mg via ORAL
  Filled 2018-08-22 (×4): qty 1

## 2018-08-22 MED ORDER — METOPROLOL TARTRATE 25 MG PO TABS
12.5000 mg | ORAL_TABLET | Freq: Once | ORAL | Status: AC
  Administered 2018-08-22: 14:00:00 12.5 mg via ORAL
  Filled 2018-08-22: qty 1

## 2018-08-22 MED ORDER — AMLODIPINE BESYLATE 5 MG PO TABS
5.0000 mg | ORAL_TABLET | Freq: Every day | ORAL | Status: DC
  Administered 2018-08-22 – 2018-08-23 (×2): 5 mg via ORAL
  Filled 2018-08-22 (×2): qty 1

## 2018-08-22 NOTE — Clinical Social Work Note (Signed)
CSW spoke with patient's son Bethzaida Boord today and gave bed offers. [name] chose [facility] for patient. CSW notified Inetta Fermo at [facility] that patient has accepted bed offer. CSW will continue to follow for discharge planning.   Ruthe Mannan MSW, [address] [phone]

## 2018-08-22 NOTE — Progress Notes (Addendum)
Patient ID: [name], female   DOB: 03/27/1932, 84 y.o.   MRN: [id number]  [employer] PROGRESS NOTE  [name] [id number] DOB: 04/20/1932 DOA: 08/11/2018 PCP: Jessica Regulus, MD  HPI/Subjective: Patient more alert today.  Blood pressure trending better since stopping clonidine patch.  Objective: Vitals:   08/22/18 0900 08/22/18 1219  BP: (!) 166/73 (!) 176/76  Pulse: 81 84  Resp:  20  Temp:  98.9 F (37.2 C)  SpO2: 98% 93%    Filed Weights   08/11/18 1254 08/13/18 1434  Weight: 84 kg 84.3 kg    ROS: Review of Systems  Unable to perform ROS: Dementia  Respiratory: Positive for cough and shortness of breath.   Cardiovascular: Negative for chest pain.  Gastrointestinal: Negative for abdominal pain.  Musculoskeletal: Negative for joint pain.   Exam: Physical Exam  HENT:  Nose: No mucosal edema.  Mouth/Throat: No oropharyngeal exudate or posterior oropharyngeal edema.  Eyes: Pupils are equal, round, and reactive to light. Conjunctivae, EOM and lids are normal.  Neck: No JVD present. Carotid bruit is not present. No edema present. No thyroid mass and no thyromegaly present.  Cardiovascular: S1 normal and S2 normal. Exam reveals no gallop.  No murmur heard. Pulses:      Dorsalis pedis pulses are 2+ on the right side, and 2+ on the left side.  Respiratory: No respiratory distress. She has decreased breath sounds in the right lower field and the left lower field. She has no wheezes. She has rhonchi in the right lower field and the left lower field. She has no rales.  GI: Soft. Bowel sounds are normal. There is no tenderness.  Musculoskeletal:       Right ankle: She exhibits swelling.       Left ankle: She exhibits swelling.  Lymphadenopathy:    She has no cervical adenopathy.  Neurological: She is alert.  Patient follows some simple commands and able to straight leg raise.  Skin: Skin is warm. No rash noted. Nails show no clubbing.  Psychiatric:   Patient answers some yes/no questions but does not elaborate      Data Reviewed: Basic Metabolic Panel: Recent Labs  Lab 08/16/18 0638 08/17/18 0521 08/18/18 0453 08/19/18 0334 08/22/18 0345  NA 143 144 144 144 143  K 3.5 3.1* 3.3* 3.6 3.9  CL 105 107 104 105 107  CO2 27 27 27 31 29   GLUCOSE 149* 149* 154* 159* 181*  BUN 17 16 14 18 19   CREATININE 0.48 0.52 0.49 0.57 0.54  CALCIUM 9.1 9.0 8.6* 8.9 8.8*    CBC: Recent Labs  Lab 08/16/18 0638 08/19/18 0334 08/22/18 0345  WBC 13.1* 10.1 10.8*  HGB 11.3* 11.0* 10.6*  HCT 37.6 36.4 36.7  MCV 84.1 83.9 88.0  PLT 273 264 267   BNP (last 3 results) Recent Labs    08/11/18 1300  BNP 96.0    CBG: Recent Labs  Lab 08/21/18 1142 08/21/18 1631 08/21/18 2103 08/22/18 0739 08/22/18 1211  GLUCAP 138* 166* 105* 151* 124*       Scheduled Meds: . aspirin EC  81 mg Oral Daily  . budesonide (PULMICORT) nebulizer solution  0.5 mg Nebulization BID  . enoxaparin (LOVENOX) injection  40 mg Subcutaneous Q24H  . famotidine  20 mg Oral Daily  . insulin aspart  0-15 Units Subcutaneous TID WC  . insulin aspart  0-5 Units Subcutaneous QHS  . ipratropium-albuterol  3 mL Nebulization BID  . irbesartan  150  mg Oral Daily  . ketotifen  1 drop Both Eyes BID  . mouth rinse  15 mL Mouth Rinse BID  . metoprolol tartrate  25 mg Oral BID  . multivitamin with minerals  1 tablet Oral Daily  . olopatadine  1 drop Both Eyes Daily   Continuous Infusions: . sodium chloride Stopped (08/17/18 1556)  . levofloxacin (LEVAQUIN) IV 100 mL/hr at 08/21/18 2200    Assessment/Plan:  1. Acute delirium and underlying dementia.  Mental status much improved.  Answering more questions.  Continue Levaquin.  Discontinue IV fluids.  Encourage eating.  Psychiatry discontinued his psychiatric medications. 2. Aspiration pneumonia.  Finish course of Levaquin 3. Acute on chronic hypercapnic respiratory failure, COPD.  Continue nebulizer treatments.   Patient on 2 L of oxygen 4. Hypertension.  Continue Avapro and metoprolol.  Add low-dose Norvasc. 5. Diabetes on sliding scale insulin 6. Incontinence get rid of oxybutynin 7. Weakness.  Patient will need a higher level of care. 8. Son concerned about her eating and hydration status.  I explained that with dementia sometimes this happens.  Her mental status is better today so she should eat better.  We will plan on sending her out to rehab tomorrow.  Code Status:     Code Status Orders  (From admission, onward)         Start     Ordered   08/11/18 2202  Do not attempt resuscitation (DNR)  Continuous    Question Answer Comment  In the event of cardiac or respiratory ARREST Do not call a "code blue"   In the event of cardiac or respiratory ARREST Do not perform Intubation, CPR, defibrillation or ACLS   In the event of cardiac or respiratory ARREST Use medication by any route, position, wound care, and other measures to relive pain and suffering. May use oxygen, suction and manual treatment of airway obstruction as needed for comfort.   Comments Nurse may pronounce      08/11/18 2201        Code Status History    Date Active Date Inactive Code Status Order ID Comments User Context   05/21/2017 2338 05/24/2017 2208 DNR [id number]  Oralia Manis, MD Inpatient    Advance Directive Documentation     Most Recent Value  Type of Advance Directive  Out of facility DNR (pink MOST or yellow form)  Pre-existing out of facility DNR order (yellow form or pink MOST form)  Yellow form placed in chart (order not valid for inpatient use)  "MOST" Form in Place?  -     Family Communication: Spoke with son on the phone today Disposition Plan: Out to rehab tomorrow  Time spent: 30 minutes  [name] [employer]

## 2018-08-22 NOTE — Progress Notes (Signed)
PT Cancellation Note  Patient Details Name: [name] MRN: [id number] DOB: May 12, 1932   Cancelled Treatment:    Reason Eval/Treat Not Completed: Other (comment). Treatment attempted; pt found (although alarm set to mod high alert) partially sliding out of bed between lifted bed rails. Pt also had O2 cord slipped down over her head around her neck and taught (no difficulty breathing). IV cord was also wrapped around IV pole and under bed rail creating a significant taught pull on pt IV site. Above things remedied and pt placed comfortably back in bed. Pt noted to be wet/possibly soiled with feces under pt fingernails. Pt given personal wipes once returned to bed and began cleaning her hands. Nursing staff notified and nurse notes this has been a continual issue with pt. This therapist believes if this is a continual issue, then pt should have 24 hour sitter for pt's safety. Will address in safety portal.    Scot Dock, PTA 08/22/2018, 3:10 PM

## 2018-08-22 NOTE — Progress Notes (Signed)
New referral for outpatient Palliative to follow at [facility] received from CSW [employer]. Plan is for discharge tomorrow. Patient information faxed to referral. Dayna Barker RN, BSN, [facility] and [facility] [place], hospital Liaison [phone]

## 2018-08-22 NOTE — Plan of Care (Signed)
Patient is resting in bed with eyes closed. No family at bedside. Patient is discharging to [facility] tomorrow. Recommend palliative to follow at D/C.    No charge.

## 2018-08-23 LAB — GLUCOSE, CAPILLARY
GLUCOSE-CAPILLARY: 195 mg/dL — AB (ref 70–99)
Glucose-Capillary: 148 mg/dL — ABNORMAL HIGH (ref 70–99)
Glucose-Capillary: 116 mg/dL — ABNORMAL HIGH (ref 70–99)
Glucose-Capillary: 156 mg/dL — ABNORMAL HIGH (ref 70–99)

## 2018-08-23 LAB — C DIFFICILE QUICK SCREEN W PCR REFLEX
C Diff antigen: NEGATIVE
C Diff interpretation: NOT DETECTED
C Diff toxin: NEGATIVE

## 2018-08-23 MED ORDER — LEVOFLOXACIN 500 MG PO TABS: 500.0000 mg | ORAL_TABLET | Freq: Every day | ORAL | 0 refills | Status: DC

## 2018-08-23 MED ORDER — IPRATROPIUM-ALBUTEROL 0.5-2.5 (3) MG/3ML IN SOLN: 3.0000 mL | Freq: Two times a day (BID) | RESPIRATORY_TRACT | 0 refills | Status: AC

## 2018-08-23 MED ORDER — LOPERAMIDE HCL 2 MG PO CAPS: 2.0000 mg | ORAL_CAPSULE | ORAL | Status: DC | PRN

## 2018-08-23 MED ORDER — INSULIN ASPART 100 UNIT/ML ~~LOC~~ SOLN: SUBCUTANEOUS | 0 refills | Status: DC

## 2018-08-23 MED ORDER — ADULT MULTIVITAMIN W/MINERALS CH: 1.0000 | ORAL_TABLET | Freq: Every day | ORAL | 0 refills | Status: DC

## 2018-08-23 MED ORDER — AMLODIPINE BESYLATE 5 MG PO TABS: 5.0000 mg | ORAL_TABLET | Freq: Every day | ORAL | 0 refills | Status: DC

## 2018-08-23 MED ORDER — METOPROLOL TARTRATE 25 MG PO TABS: 25.0000 mg | ORAL_TABLET | Freq: Two times a day (BID) | ORAL | 0 refills | Status: DC

## 2018-08-23 MED ORDER — CALCIUM CARBONATE ANTACID 500 MG PO CHEW: 400.0000 mg | CHEWABLE_TABLET | Freq: Three times a day (TID) | ORAL | 0 refills | Status: DC | PRN

## 2018-08-23 MED ORDER — QUETIAPINE FUMARATE 25 MG PO TABS: 25.0000 mg | ORAL_TABLET | Freq: Every evening | ORAL | 0 refills | Status: DC | PRN

## 2018-08-23 NOTE — Discharge Summary (Addendum)
[facility] - [place] at [facility]   PATIENT NAME: [name]    MR#:  [id number]  DATE OF BIRTH:  15-Jun-1932  DATE OF ADMISSION:  08/11/2018 ADMITTING PHYSICIAN: Bertrum Sol, MD  DATE OF DISCHARGE: 08/24/2018  PRIMARY CARE PHYSICIAN: Lauro Regulus, MD    ADMISSION DIAGNOSIS:  Cough [R05] Generalized weakness [R53.1] Sepsis due to other etiology (HCC) [A41.89] Altered mental status, unspecified altered mental status type [R41.82]  DISCHARGE DIAGNOSIS:  Principal Problem:   Acute delirium Active Problems:   Encephalopathy acute   Dementia with behavioral disturbance (HCC)   SECONDARY DIAGNOSIS:   Past Medical History:  Diagnosis Date  . Anxiety   . Depression   . Diabetes mellitus without complication (HCC)   . GERD (gastroesophageal reflux disease)   . Hip fracture (HCC)   . Hyperlipemia   . Neuropathy   . Vertigo     HOSPITAL COURSE:   1.  Acute delirium and underlying dementia.  Mental status has been better for the last 3 days after starting antibiotics for another aspiration pneumonia and discontinuing psychiatric medications. 2.  Aspiration pneumonia.  Patient started on Levaquin.  Patient will have a 5-day course of Levaquin which is 1 more pills. 3.  Acute on chronic hypercapnic respiratory failure, COPD.  Continue nebulizer treatments.  Patient on 2 L of oxygen.  Continue chronic oxygen. 3.  Hypertension.  I ended up discontinuing clonidine patch with altered mental status.  Continue the Avapro.  Metoprolol dose increased.  I increased Norvasc to 10 mg po dailu 4.  Diabetes mellitus.  We have been using sliding scale here.  I would give 3 units of short acting insulin prior to meals if she is eating. 5.  Weakness.  Physical therapy recommends rehab 6.  Dysphagia 1 diet with nectar thick liquids.  Speech therapy to follow at facility.  Difficult to stay hydrated with this diet. 7.  Palliative care to follow at facility.  Patient is  a DNR and high likelihood of decline.  Can consider hospice if declines. 8.  Diarrhea.  C. difficile negative.  PRN Imodium. 9.  Pneumonia on admission and received full course of antibiotics with cefepime  DISCHARGE CONDITIONS:   Fair  CONSULTS OBTAINED:  Treatment Team:  Audery Amel, MD  DRUG ALLERGIES:   Allergies  Allergen Reactions  . Ampicillin     Zpac  . Sulfa Antibiotics     DISCHARGE MEDICATIONS:   Allergies as of 08/23/2018      Reactions   Ampicillin    Zpac   Sulfa Antibiotics       Medication List    STOP taking these medications   busPIRone 10 MG tablet Commonly known as:  BUSPAR   Cholecalciferol 1000 units tablet   citalopram 20 MG tablet Commonly known as:  CELEXA   dicyclomine 20 MG tablet Commonly known as:  BENTYL   diphenhydrAMINE 2 % cream Commonly known as:  BENADRYL   divalproex 250 MG 24 hr tablet Commonly known as:  DEPAKOTE ER   ferrous sulfate 325 (65 FE) MG tablet   gabapentin 300 MG capsule Commonly known as:  NEURONTIN   glipiZIDE 5 MG tablet Commonly known as:  GLUCOTROL   LORazepam 0.5 MG tablet Commonly known as:  ATIVAN   oxybutynin 5 MG tablet Commonly known as:  DITROPAN     TAKE these medications   acetaminophen 325 MG tablet Commonly known as:  TYLENOL Take 650 mg by mouth every  4 (four) hours as needed for mild pain or moderate pain.   amLODipine 5 MG tablet Commonly known as:  NORVASC Take 1 tablet (5 mg total) by mouth daily.   aspirin EC 81 MG tablet Take 81 mg by mouth daily.   calcium carbonate 500 MG chewable tablet Commonly known as:  TUMS - dosed in mg elemental calcium Chew 2 tablets (400 mg of elemental calcium total) by mouth 3 (three) times daily as needed for indigestion or heartburn.   cetirizine 10 MG tablet Commonly known as:  ZYRTEC Take 10 mg by mouth daily.   CULTURELLE Caps Take 1 capsule by mouth daily.   esomeprazole 20 MG capsule Commonly known as:  NEXIUM Take  20 mg by mouth daily at 12 noon.   fluticasone 50 MCG/ACT nasal spray Commonly known as:  FLONASE Place 1 spray into both nostrils daily.   insulin aspart 100 UNIT/ML injection Commonly known as:  novoLOG 3 units with each meal if she eats   ipratropium-albuterol 0.5-2.5 (3) MG/3ML Soln Commonly known as:  DUONEB Take 3 mLs by nebulization 2 (two) times daily.   ketotifen 0.025 % ophthalmic solution Commonly known as:  ZADITOR Place 1 drop into both eyes 2 (two) times daily.   levofloxacin 500 MG tablet Commonly known as:  LEVAQUIN Take 1 tablet (500 mg total) by mouth at bedtime for 2 days.   loperamide 2 MG capsule Commonly known as:  IMODIUM Take 2 mg by mouth daily as needed for diarrhea or loose stools.   metoprolol tartrate 25 MG tablet Commonly known as:  LOPRESSOR Take 1 tablet (25 mg total) by mouth 2 (two) times daily.   multivitamin with minerals Tabs tablet Take 1 tablet by mouth daily.   olopatadine 0.1 % ophthalmic solution Commonly known as:  PATANOL Place 1 drop into both eyes daily.   ondansetron 4 MG tablet Commonly known as:  ZOFRAN Take 4 mg by mouth every 8 (eight) hours as needed for nausea or vomiting.   QUEtiapine 25 MG tablet Commonly known as:  SEROQUEL Take 1 tablet (25 mg total) by mouth at bedtime as needed (psychosis, insomnia). What changed:    when to take this  reasons to take this   telmisartan 40 MG tablet Commonly known as:  MICARDIS Take 40 mg by mouth daily.        DISCHARGE INSTRUCTIONS:   Follow-up with team at facility Follow-up with palliative care at facility  If you experience worsening of your admission symptoms, develop shortness of breath, life threatening emergency, suicidal or homicidal thoughts you must seek medical attention immediately by calling 911 or calling your MD immediately  if symptoms less severe.  You Must read complete instructions/literature along with all the possible adverse  reactions/side effects for all the Medicines you take and that have been prescribed to you. Take any new Medicines after you have completely understood and accept all the possible adverse reactions/side effects.   Please note  You were cared for by a hospitalist during your hospital stay. If you have any questions about your discharge medications or the care you received while you were in the hospital after you are discharged, you can call the unit and asked to speak with the hospitalist on call if the hospitalist that took care of you is not available. Once you are discharged, your primary care physician will handle any further medical issues. Please note that NO REFILLS for any discharge medications will be authorized once you are  discharged, as it is imperative that you return to your primary care physician (or establish a relationship with a primary care physician if you do not have one) for your aftercare needs so that they can reassess your need for medications and monitor your lab values.    Today   CHIEF COMPLAINT:   Chief Complaint  Patient presents with  . Weakness  . Cough    HISTORY OF PRESENT ILLNESS:  [name]  is a 84 y.o. female with a known history of came in with weakness and cough and found to have pneumonia   VITAL SIGNS:  Blood pressure (!) 163/79, pulse 86, temperature 98.1 F (36.7 C), temperature source Oral, resp. rate 20, height 5\' 4"  (1.626 m), weight 84.3 kg, SpO2 96 %.    PHYSICAL EXAMINATION:  GENERAL:  84 y.o.-year-old patient lying in the bed with no acute distress.  EYES: Pupils equal, round, reactive to light and accommodation. No scleral icterus. Extraocular muscles intact.  HEENT: Head atraumatic, normocephalic. Oropharynx and nasopharynx clear.  NECK:  Supple, no jugular venous distention. No thyroid enlargement, no tenderness.  LUNGS: decreased breath sounds bilaterally, no wheezing, rales,rhonchi or crepitation. No use of accessory muscles of  respiration.  CARDIOVASCULAR: S1, S2 normal. No murmurs, rubs, or gallops.  ABDOMEN: Soft, non-tender, non-distended. Bowel sounds present. No organomegaly or mass.  EXTREMITIES: trace pedal edema, No cyanosis, or clubbing.  NEUROLOGIC: Cranial nerves II through XII are intact. Muscle strength 5/5 in all extremities. Sensation intact. Gait not checked.  PSYCHIATRIC: The patient is alert and oriented x 3.  SKIN: No obvious rash, lesion, or ulcer.   DATA REVIEW:   CBC Recent Labs  Lab 08/22/18 0345  WBC 10.8*  HGB 10.6*  HCT 36.7  PLT 267    Chemistries  Recent Labs  Lab 08/22/18 0345  NA 143  K 3.9  CL 107  CO2 29  GLUCOSE 181*  BUN 19  CREATININE 0.54  CALCIUM 8.8*     Microbiology Results  Results for orders placed or performed during the hospital encounter of 08/11/18  Culture, blood (routine x 2)     Status: None   Collection Time: 08/11/18  3:51 PM  Result Value Ref Range Status   Specimen Description BLOOD L AC  Final   Special Requests   Final    BOTTLES DRAWN AEROBIC AND ANAEROBIC Blood Culture adequate volume   Culture   Final    NO GROWTH 5 DAYS Performed at [facility], [address] [place], [place] [address]    Report Status 08/16/2018 FINAL  Final  Culture, blood (routine x 2)     Status: None   Collection Time: 08/11/18  4:20 PM  Result Value Ref Range Status   Specimen Description BLOOD RFA  Final   Special Requests   Final    BOTTLES DRAWN AEROBIC AND ANAEROBIC Blood Culture results may not be optimal due to an inadequate volume of blood received in culture bottles   Culture   Final    NO GROWTH 5 DAYS Performed at [facility], [address] [place], [place] [address]    Report Status 08/16/2018 FINAL  Final  MRSA PCR Screening     Status: None   Collection Time: 08/11/18 11:58 PM  Result Value Ref Range Status   MRSA by PCR NEGATIVE NEGATIVE Final    Comment:        The GeneXpert MRSA Assay (FDA approved for  NASAL specimens only), is  one component of a comprehensive MRSA colonization surveillance program. It is not intended to diagnose MRSA infection nor to guide or monitor treatment for MRSA infections. Performed at [facility], [address] [place], [place] [address]   C difficile quick scan w PCR reflex     Status: None   Collection Time: 08/23/18  1:44 AM  Result Value Ref Range Status   C Diff antigen NEGATIVE NEGATIVE Final   C Diff toxin NEGATIVE NEGATIVE Final   C Diff interpretation No C. difficile detected.  Final    Comment: Performed at [facility], [address] [place], [place] [address]     Management plans discussed with the patient, family (yesterday) and they are in agreement.  CODE STATUS:     Code Status Orders  (From admission, onward)         Start     Ordered   08/11/18 2202  Do not attempt resuscitation (DNR)  Continuous    Question Answer Comment  In the event of cardiac or respiratory ARREST Do not call a "code blue"   In the event of cardiac or respiratory ARREST Do not perform Intubation, CPR, defibrillation or ACLS   In the event of cardiac or respiratory ARREST Use medication by any route, position, wound care, and other measures to relive pain and suffering. May use oxygen, suction and manual treatment of airway obstruction as needed for comfort.   Comments Nurse may pronounce      08/11/18 2201        Code Status History    Date Active Date Inactive Code Status Order ID Comments User Context   05/21/2017 2338 05/24/2017 2208 DNR [id number]  Oralia Manis, MD Inpatient    Advance Directive Documentation     Most Recent Value  Type of Advance Directive  Out of facility DNR (pink MOST or yellow form)  Pre-existing out of facility DNR order (yellow form or pink MOST form)  Yellow form placed in chart (order not valid for inpatient use)  "MOST" Form in Place?  -      TOTAL TIME TAKING CARE OF THIS PATIENT: 34  minutes.    Alford Highland M.D on 08/23/2018 at 9:10 AM  Between 7am to 6pm - Pager - [phone]  After 6pm go to [email or web address] - password [email or web address]  [facility] Office  [phone]  CC: Primary care physician; Lauro Regulus, MD

## 2018-08-23 NOTE — Care Management Important Message (Signed)
Copy of signed IM left with patient in room.  

## 2018-08-23 NOTE — Progress Notes (Signed)
Patient ID: [name], female   DOB: 06-11-1932, 84 y.o.   MRN: [id number]  [facility] PROGRESS NOTE  [name] WUJ:[id number] DOB: Dec 27, 1931 DOA: 08/11/2018 PCP: Lauro Regulus, MD  HPI/Subjective: Patient feeling okay.  Offers no complaints  Objective: Vitals:   08/23/18 0851 08/23/18 1440  BP: (!) 163/79 (!) 166/64  Pulse: 86 94  Resp:  20  Temp:  98 F (36.7 C)  SpO2: 96% 96%    Filed Weights   08/11/18 1254 08/13/18 1434  Weight: 84 kg 84.3 kg    ROS: Review of Systems  Unable to perform ROS: Dementia  Respiratory: Positive for shortness of breath. Negative for cough.   Cardiovascular: Negative for chest pain.  Gastrointestinal: Negative for abdominal pain.  Musculoskeletal: Negative for joint pain.   Exam: Physical Exam  HENT:  Nose: No mucosal edema.  Mouth/Throat: No oropharyngeal exudate or posterior oropharyngeal edema.  Eyes: Pupils are equal, round, and reactive to light. Conjunctivae, EOM and lids are normal.  Neck: No JVD present. Carotid bruit is not present. No edema present. No thyroid mass and no thyromegaly present.  Cardiovascular: S1 normal and S2 normal. Exam reveals no gallop.  No murmur heard. Pulses:      Dorsalis pedis pulses are 2+ on the right side, and 2+ on the left side.  Respiratory: No respiratory distress. She has decreased breath sounds in the right lower field and the left lower field. She has no wheezes. She has rhonchi in the right lower field and the left lower field. She has no rales.  GI: Soft. Bowel sounds are normal. There is no tenderness.  Musculoskeletal:       Right ankle: She exhibits swelling.       Left ankle: She exhibits swelling.  Lymphadenopathy:    She has no cervical adenopathy.  Neurological: She is alert.  Patient follows some simple commands and able to straight leg raise.  Skin: Skin is warm. No rash noted. Nails show no clubbing.  Psychiatric:  Patient answers some yes/no  questions but does not elaborate      Data Reviewed: Basic Metabolic Panel: Recent Labs  Lab 08/17/18 0521 08/18/18 0453 08/19/18 0334 08/22/18 0345  NA 144 144 144 143  K 3.1* 3.3* 3.6 3.9  CL 107 104 105 107  CO2 27 27 31 29   GLUCOSE 149* 154* 159* 181*  BUN 16 14 18 19   CREATININE 0.52 0.49 0.57 0.54  CALCIUM 9.0 8.6* 8.9 8.8*    CBC: Recent Labs  Lab 08/19/18 0334 08/22/18 0345  WBC 10.1 10.8*  HGB 11.0* 10.6*  HCT 36.4 36.7  MCV 83.9 88.0  PLT 264 267   BNP (last 3 results) Recent Labs    08/11/18 1300  BNP 96.0    CBG: Recent Labs  Lab 08/22/18 1211 08/22/18 1657 08/22/18 2117 08/23/18 0724 08/23/18 1205  GLUCAP 124* 133* 189* 148* 116*       Scheduled Meds: . amLODipine  5 mg Oral Daily  . aspirin EC  81 mg Oral Daily  . budesonide (PULMICORT) nebulizer solution  0.5 mg Nebulization BID  . enoxaparin (LOVENOX) injection  40 mg Subcutaneous Q24H  . famotidine  20 mg Oral Daily  . insulin aspart  0-15 Units Subcutaneous TID WC  . insulin aspart  0-5 Units Subcutaneous QHS  . ipratropium-albuterol  3 mL Nebulization BID  . irbesartan  150 mg Oral Daily  . ketotifen  1 drop Both Eyes BID  .  mouth rinse  15 mL Mouth Rinse BID  . metoprolol tartrate  25 mg Oral BID  . multivitamin with minerals  1 tablet Oral Daily  . olopatadine  1 drop Both Eyes Daily   Continuous Infusions: . sodium chloride Stopped (08/17/18 1556)  . levofloxacin (LEVAQUIN) IV 500 mg (08/22/18 1751)    Assessment/Plan:  1. Acute delirium and underlying dementia.  Mental status has improved.  Continue Levaquin.  Encourage eating.  Psychiatry discontinued his psychiatric medications. 2. Aspiration pneumonia.  Finish course of Levaquin 3. Acute on chronic hypercapnic respiratory failure, COPD.  Continue nebulizer treatments.  Patient on 2 L of oxygen 4. Hypertension.  Continue Avapro and metoprolol.  Add low-dose Norvasc. 5. Diabetes on sliding scale  insulin 6. Incontinence get rid of oxybutynin 7. Weakness.  Patient will need a higher level of care.   Code Status:     Code Status Orders  (From admission, onward)         Start     Ordered   08/11/18 2202  Do not attempt resuscitation (DNR)  Continuous    Question Answer Comment  In the event of cardiac or respiratory ARREST Do not call a "code blue"   In the event of cardiac or respiratory ARREST Do not perform Intubation, CPR, defibrillation or ACLS   In the event of cardiac or respiratory ARREST Use medication by any route, position, wound care, and other measures to relive pain and suffering. May use oxygen, suction and manual treatment of airway obstruction as needed for comfort.   Comments Nurse may pronounce      08/11/18 2201        Code Status History    Date Active Date Inactive Code Status Order ID Comments User Context   05/21/2017 2338 05/24/2017 2208 DNR [id number]  Oralia Manis, MD Inpatient    Advance Directive Documentation     Most Recent Value  Type of Advance Directive  Out of facility DNR (pink MOST or yellow form)  Pre-existing out of facility DNR order (yellow form or pink MOST form)  Yellow form placed in chart (order not valid for inpatient use)  "MOST" Form in Place?  -     Family Communication: Spoke with son on the phone today Disposition Plan: Just informed that the passar is not back.  Hopefully will get the passar tomorrow.  Time spent: 35 minutes  Seleste Tallman [employer]

## 2018-08-24 LAB — GLUCOSE, CAPILLARY
GLUCOSE-CAPILLARY: 131 mg/dL — AB (ref 70–99)
Glucose-Capillary: 90 mg/dL (ref 70–99)

## 2018-08-24 MED ORDER — AMLODIPINE BESYLATE 10 MG PO TABS: 10.0000 mg | ORAL_TABLET | Freq: Every day | ORAL | 0 refills | Status: DC

## 2018-08-24 MED ORDER — AMLODIPINE BESYLATE 10 MG PO TABS
10.0000 mg | ORAL_TABLET | Freq: Every day | ORAL | Status: DC
  Administered 2018-08-24: 10 mg via ORAL
  Filled 2018-08-24: qty 1

## 2018-08-24 MED ORDER — LEVOFLOXACIN 500 MG PO TABS: 500.0000 mg | ORAL_TABLET | Freq: Every day | ORAL | 0 refills | Status: AC

## 2018-08-24 NOTE — Progress Notes (Signed)
Physical Therapy Treatment Patient Details Name: [name] MRN: [id number] DOB: 1932/03/26 Today's Date: 08/24/2018    History of Present Illness 84 yo female with onset of PNA sepsis and elevated troponin was admitted with delirium, from her prior living arrangment at ALF.  PMHx: neuropathy, vertigo, L hip fracture, DM, R humeral fracture    PT Comments    Asleep upon arrival but awoke with verbal and light tactile cues.  Ready for session.  Participated in exercises as described below.  To edge of bed with min a x 1.  She was able to assist well in transition today and remain upright for 5+ minutes with supervision/min guard for safety.  While she had no LOB, posture was generally poor with flexed trunk.  While she was motivated to attempt to stand, she was unable to provide any assistance or lateral scoot left/right or forward back to assist.  After attempts, she was fatigued and required max a x 1 to return to bed and reposition for comfort.  She stated she was hungry and nurse tech was called to assist with breakfast.     Follow Up Recommendations  SNF     Equipment Recommendations       Recommendations for Other Services       Precautions / Restrictions Precautions Precautions: Fall Restrictions Weight Bearing Restrictions: No    Mobility  Bed Mobility Overal bed mobility: Needs Assistance Bed Mobility: Supine to Sit;Sit to Supine     Supine to sit: Min assist Sit to supine: Max assist      Transfers                 General transfer comment: pt could not stand on command or with assistance  Ambulation/Gait                 Stairs             Wheelchair Mobility    Modified Rankin (Stroke Patients Only)       Balance Overall balance assessment: Needs assistance Sitting-balance support: Feet supported;Single extremity supported Sitting balance-Leahy Scale: Fair Sitting balance - Comments: able to remain upright without assist  but with generally poor posture     Standing balance-Leahy Scale: Zero                              Cognition Arousal/Alertness: Lethargic Behavior During Therapy: WFL for tasks assessed/performed Overall Cognitive Status: History of cognitive impairments - at baseline                                        Exercises Other Exercises Other Exercises: supine ankle pumps, heel slides, slr x 10 ble Other Exercises: seated LAQ and marches    General Comments        Pertinent Vitals/Pain Pain Assessment: No/denies pain    Home Living                      Prior Function            PT Goals (current goals can now be found in the care plan section) Progress towards PT goals: Progressing toward goals    Frequency    Min 2X/week      PT Plan Current plan remains appropriate    Co-evaluation  AM-PAC PT "6 Clicks" Daily Activity  Outcome Measure  Difficulty turning over in bed (including adjusting bedclothes, sheets and blankets)?: Unable Difficulty moving from lying on back to sitting on the side of the bed? : Unable Difficulty sitting down on and standing up from a chair with arms (e.g., wheelchair, bedside commode, etc,.)?: Unable Help needed moving to and from a bed to chair (including a wheelchair)?: Total Help needed walking in hospital room?: Total Help needed climbing 3-5 steps with a railing? : Total 6 Click Score: 6    End of Session Equipment Utilized During Treatment: Gait belt Activity Tolerance: Patient limited by fatigue Patient left: in bed;with call bell/phone within reach;with bed alarm set Nurse Communication: Other (comment)       Time: 1610-9604 PT Time Calculation (min) (ACUTE ONLY): 17 min  Charges:  $Therapeutic Exercise: 8-22 mins                     Danielle Dess, PTA 08/24/18, 9:25 AM

## 2018-08-24 NOTE — Progress Notes (Signed)
Pt left with EMS to the [facility].  No complaints of pain or discomfort.

## 2018-08-24 NOTE — Progress Notes (Signed)
Report called to [facility] and talked to the nurse Telena.  Called EMS to transport the pt to the facility.

## 2018-08-24 NOTE — Progress Notes (Signed)
Patient ID: [name], female   DOB: 10/26/1932, 84 y.o.   MRN: [id number]  [facility] PROGRESS NOTE  [name] WUJ:[id number] DOB: [birth year] DOA: 08/11/2018 PCP: Lauro Regulus, MD  HPI/Subjective: Patient feeling okay.  Answers some yes or no questions.  No pain.  Had an episode of diarrhea today  Objective: Vitals:   08/24/18 0458 08/24/18 0748  BP: (!) 157/97   Pulse: 94   Resp:    Temp:    SpO2:  98%    Filed Weights   08/11/18 1254 08/13/18 1434  Weight: 84 kg 84.3 kg    ROS: Review of Systems  Unable to perform ROS: Dementia  Respiratory: Positive for shortness of breath. Negative for cough.   Cardiovascular: Negative for chest pain.  Gastrointestinal: Negative for abdominal pain.  Musculoskeletal: Negative for joint pain.   Exam: Physical Exam  HENT:  Nose: No mucosal edema.  Mouth/Throat: No oropharyngeal exudate or posterior oropharyngeal edema.  Eyes: Pupils are equal, round, and reactive to light. Conjunctivae, EOM and lids are normal.  Neck: No JVD present. Carotid bruit is not present. No edema present. No thyroid mass and no thyromegaly present.  Cardiovascular: S1 normal and S2 normal. Exam reveals no gallop.  No murmur heard. Pulses:      Dorsalis pedis pulses are 2+ on the right side, and 2+ on the left side.  Respiratory: No respiratory distress. She has decreased breath sounds in the right lower field and the left lower field. She has no wheezes. She has rhonchi in the right lower field and the left lower field. She has no rales.  GI: Soft. Bowel sounds are normal. There is no tenderness.  Musculoskeletal:       Right ankle: She exhibits swelling.       Left ankle: She exhibits swelling.  Lymphadenopathy:    She has no cervical adenopathy.  Neurological: She is alert.  Patient follows some simple commands and able to straight leg raise.  Skin: Skin is warm. No rash noted. Nails show no clubbing.  Psychiatric:  Patient  answers some yes/no questions but does not elaborate      Data Reviewed: Basic Metabolic Panel: Recent Labs  Lab 08/18/18 0453 08/19/18 0334 08/22/18 0345  NA 144 144 143  K 3.3* 3.6 3.9  CL 104 105 107  CO2 27 31 29   GLUCOSE 154* 159* 181*  BUN 14 18 19   CREATININE 0.49 0.57 0.54  CALCIUM 8.6* 8.9 8.8*    CBC: Recent Labs  Lab 08/19/18 0334 08/22/18 0345  WBC 10.1 10.8*  HGB 11.0* 10.6*  HCT 36.4 36.7  MCV 83.9 88.0  PLT 264 267   BNP (last 3 results) Recent Labs    08/11/18 1300  BNP 96.0    CBG: Recent Labs  Lab 08/23/18 1205 08/23/18 1752 08/23/18 2143 08/24/18 0728 08/24/18 1224  GLUCAP 116* 195* 156* 131* 90       Scheduled Meds: . amLODipine  10 mg Oral Daily  . aspirin EC  81 mg Oral Daily  . budesonide (PULMICORT) nebulizer solution  0.5 mg Nebulization BID  . enoxaparin (LOVENOX) injection  40 mg Subcutaneous Q24H  . famotidine  20 mg Oral Daily  . insulin aspart  0-15 Units Subcutaneous TID WC  . insulin aspart  0-5 Units Subcutaneous QHS  . ipratropium-albuterol  3 mL Nebulization BID  . irbesartan  150 mg Oral Daily  . ketotifen  1 drop Both Eyes BID  . mouth  rinse  15 mL Mouth Rinse BID  . metoprolol tartrate  25 mg Oral BID  . multivitamin with minerals  1 tablet Oral Daily  . olopatadine  1 drop Both Eyes Daily   Continuous Infusions: . sodium chloride Stopped (08/17/18 1556)  . levofloxacin (LEVAQUIN) IV Stopped (08/23/18 2215)    Assessment/Plan:  1. Acute delirium and underlying dementia.  Mental status has improved.  Continue Levaquin with last dose tonight.  Encourage eating.  Psychiatry discontinued his psychiatric medications. 2. Aspiration pneumonia.  Finish course of Levaquin with last dose tonight 3. Acute on chronic hypercapnic respiratory failure, COPD.  Continue nebulizer treatments.  Patient on 2 L of oxygen 4. Hypertension.  Continue Avapro and metoprolol.  Increase Norvasc to 10 mg daily 5. Diabetes on  sliding scale insulin 6. Incontinence get rid of oxybutynin 7. Weakness.  Patient will need a higher level of care. 8. Diarrhea.  PRN Imodium   Code Status:     Code Status Orders  (From admission, onward)         Start     Ordered   08/11/18 2202  Do not attempt resuscitation (DNR)  Continuous    Question Answer Comment  In the event of cardiac or respiratory ARREST Do not call a "code blue"   In the event of cardiac or respiratory ARREST Do not perform Intubation, CPR, defibrillation or ACLS   In the event of cardiac or respiratory ARREST Use medication by any route, position, wound care, and other measures to relive pain and suffering. May use oxygen, suction and manual treatment of airway obstruction as needed for comfort.   Comments Nurse may pronounce      08/11/18 2201        Code Status History    Date Active Date Inactive Code Status Order ID Comments User Context   05/21/2017 2338 05/24/2017 2208 DNR [id number]  Oralia Manis, MD Inpatient    Advance Directive Documentation     Most Recent Value  Type of Advance Directive  Out of facility DNR (pink MOST or yellow form)  Pre-existing out of facility DNR order (yellow form or pink MOST form)  Yellow form placed in chart (order not valid for inpatient use)  "MOST" Form in Place?  -     Family Communication: Spoke with son on the phone yesterday about plan Disposition Plan: Receive past are now so patient can go out to facility.  Time spent: 32 minutes  Chenita Ruda [employer]

## 2018-08-24 NOTE — Clinical Social Work Note (Signed)
Patient is medically ready for discharge today. CSW notified patient's son Habiba Treloar [phone]. Son states that patient should be transported by EMS and he will follow. CSW also notified Tina at [facility]. Patient will be transported by EMS. RN to call report and call for transport.   Ruthe Mannan MSW, [address] [phone]

## 2018-09-21 ENCOUNTER — Encounter (INDEPENDENT_AMBULATORY_CARE_PROVIDER_SITE_OTHER): Payer: Medicare Other

## 2018-09-21 ENCOUNTER — Ambulatory Visit (INDEPENDENT_AMBULATORY_CARE_PROVIDER_SITE_OTHER): Payer: Medicare Other | Admitting: Nurse Practitioner

## 2018-11-21 ENCOUNTER — Encounter (INDEPENDENT_AMBULATORY_CARE_PROVIDER_SITE_OTHER): Payer: Medicare Other

## 2018-11-21 ENCOUNTER — Ambulatory Visit (INDEPENDENT_AMBULATORY_CARE_PROVIDER_SITE_OTHER): Payer: Medicare Other | Admitting: Nurse Practitioner

## 2019-03-06 ENCOUNTER — Telehealth: Payer: Self-pay | Admitting: Primary Care

## 2019-03-06 NOTE — Telephone Encounter (Signed)
Patient was seen by community palliative care. She is now in an ALF and staff states doing well. Patient moved tho this ALF and no referral for palliative care was received from PCP. D/c from community palliative services, may readmit with consultation request.

## 2019-05-23 ENCOUNTER — Inpatient Hospital Stay
Admission: EM | Admit: 2019-05-23 | Discharge: 2019-05-28 | DRG: 291 | Disposition: A | Discharge status: Nursing Home (Includes ICF) | Payer: Medicare Other | Attending: Internal Medicine | Admitting: Internal Medicine

## 2019-05-23 ENCOUNTER — Other Ambulatory Visit: Payer: Self-pay

## 2019-05-23 ENCOUNTER — Emergency Department: Payer: Medicare Other

## 2019-05-23 DIAGNOSIS — Z7982 Long term (current) use of aspirin: Secondary | ICD-10-CM | POA: Diagnosis not present

## 2019-05-23 DIAGNOSIS — F419 Anxiety disorder, unspecified: Secondary | ICD-10-CM | POA: Diagnosis present

## 2019-05-23 DIAGNOSIS — Z9049 Acquired absence of other specified parts of digestive tract: Secondary | ICD-10-CM | POA: Diagnosis not present

## 2019-05-23 DIAGNOSIS — Z794 Long term (current) use of insulin: Secondary | ICD-10-CM | POA: Diagnosis not present

## 2019-05-23 DIAGNOSIS — E1140 Type 2 diabetes mellitus with diabetic neuropathy, unspecified: Secondary | ICD-10-CM | POA: Diagnosis present

## 2019-05-23 DIAGNOSIS — F0391 Unspecified dementia with behavioral disturbance: Secondary | ICD-10-CM | POA: Diagnosis present

## 2019-05-23 DIAGNOSIS — Z833 Family history of diabetes mellitus: Secondary | ICD-10-CM | POA: Diagnosis not present

## 2019-05-23 DIAGNOSIS — Z79899 Other long term (current) drug therapy: Secondary | ICD-10-CM

## 2019-05-23 DIAGNOSIS — N3000 Acute cystitis without hematuria: Secondary | ICD-10-CM | POA: Diagnosis present

## 2019-05-23 DIAGNOSIS — R451 Restlessness and agitation: Secondary | ICD-10-CM | POA: Diagnosis present

## 2019-05-23 DIAGNOSIS — Z66 Do not resuscitate: Secondary | ICD-10-CM | POA: Diagnosis present

## 2019-05-23 DIAGNOSIS — Z20828 Contact with and (suspected) exposure to other viral communicable diseases: Secondary | ICD-10-CM | POA: Diagnosis present

## 2019-05-23 DIAGNOSIS — F329 Major depressive disorder, single episode, unspecified: Secondary | ICD-10-CM | POA: Diagnosis present

## 2019-05-23 DIAGNOSIS — I110 Hypertensive heart disease with heart failure: Secondary | ICD-10-CM | POA: Diagnosis present

## 2019-05-23 DIAGNOSIS — Z7951 Long term (current) use of inhaled steroids: Secondary | ICD-10-CM

## 2019-05-23 DIAGNOSIS — R0902 Hypoxemia: Secondary | ICD-10-CM | POA: Diagnosis present

## 2019-05-23 DIAGNOSIS — E785 Hyperlipidemia, unspecified: Secondary | ICD-10-CM | POA: Diagnosis present

## 2019-05-23 DIAGNOSIS — K219 Gastro-esophageal reflux disease without esophagitis: Secondary | ICD-10-CM | POA: Diagnosis present

## 2019-05-23 DIAGNOSIS — Z5329 Procedure and treatment not carried out because of patient's decision for other reasons: Secondary | ICD-10-CM | POA: Diagnosis not present

## 2019-05-23 DIAGNOSIS — I5031 Acute diastolic (congestive) heart failure: Secondary | ICD-10-CM | POA: Diagnosis present

## 2019-05-23 DIAGNOSIS — Z8249 Family history of ischemic heart disease and other diseases of the circulatory system: Secondary | ICD-10-CM

## 2019-05-23 LAB — COMPREHENSIVE METABOLIC PANEL
ALT: 19 U/L (ref 0–44)
AST: 14 U/L — ABNORMAL LOW (ref 15–41)
Albumin: 3.3 g/dL — ABNORMAL LOW (ref 3.5–5.0)
Alkaline Phosphatase: 93 U/L (ref 38–126)
Anion gap: 6 (ref 5–15)
BUN: 17 mg/dL (ref 8–23)
CO2: 27 mmol/L (ref 22–32)
Calcium: 9.2 mg/dL (ref 8.9–10.3)
Chloride: 109 mmol/L (ref 98–111)
Creatinine, Ser: 0.69 mg/dL (ref 0.44–1.00)
GFR calc Af Amer: >60 mL/min (ref >60)
GFR calc non Af Amer: >60 mL/min (ref >60)
Glucose, Bld: 134 mg/dL — ABNORMAL HIGH (ref 70–99)
Potassium: 4.4 mmol/L (ref 3.5–5.1)
Sodium: 142 mmol/L (ref 135–145)
Total Bilirubin: 0.5 mg/dL (ref 0.3–1.2)
Total Protein: 7.3 g/dL (ref 6.5–8.1)

## 2019-05-23 LAB — CBC WITH DIFFERENTIAL/PLATELET
Abs Immature Granulocytes: 0.02 10*3/uL (ref 0.00–0.07)
Basophils Absolute: 0 10*3/uL (ref 0.0–0.1)
Basophils Relative: 0 %
Eosinophils Absolute: 0.2 10*3/uL (ref 0.0–0.5)
Eosinophils Relative: 2 %
HCT: 36.8 % (ref 36.0–46.0)
Hemoglobin: 11 g/dL — ABNORMAL LOW (ref 12.0–15.0)
Immature Granulocytes: 0 %
Lymphocytes Relative: 14 %
Lymphs Abs: 1.3 10*3/uL (ref 0.7–4.0)
MCH: 25.5 pg — ABNORMAL LOW (ref 26.0–34.0)
MCHC: 29.9 g/dL — ABNORMAL LOW (ref 30.0–36.0)
MCV: 85.4 fL (ref 80.0–100.0)
Monocytes Absolute: 0.8 10*3/uL (ref 0.1–1.0)
Monocytes Relative: 8 %
Neutro Abs: 6.8 10*3/uL (ref 1.7–7.7)
Neutrophils Relative %: 76 %
Platelets: 214 10*3/uL (ref 150–400)
RBC: 4.31 MIL/uL (ref 3.87–5.11)
RDW: 14.3 % (ref 11.5–15.5)
WBC: 9.1 10*3/uL (ref 4.0–10.5)
nRBC: 0 % (ref 0.0–0.2)

## 2019-05-23 LAB — SARS CORONAVIRUS 2 BY RT PCR (HOSPITAL ORDER, PERFORMED IN ~~LOC~~ HOSPITAL LAB): SARS Coronavirus 2: NEGATIVE

## 2019-05-23 LAB — HEMOGLOBIN A1C
Hgb A1c MFr Bld: 6.7 % — ABNORMAL HIGH (ref 4.8–5.6)
Mean Plasma Glucose: 145.59 mg/dL

## 2019-05-23 LAB — URINALYSIS, COMPLETE (UACMP) WITH MICROSCOPIC
Bilirubin Urine: NEGATIVE
Glucose, UA: NEGATIVE mg/dL
Hgb urine dipstick: NEGATIVE
Ketones, ur: NEGATIVE mg/dL
Nitrite: NEGATIVE
Protein, ur: 100 mg/dL — AB
Specific Gravity, Urine: 1.021 (ref 1.005–1.030)
WBC, UA: >50 WBC/hpf — ABNORMAL HIGH (ref 0–5)
pH: 6 (ref 5.0–8.0)

## 2019-05-23 LAB — GLUCOSE, CAPILLARY
Glucose-Capillary: 95 mg/dL (ref 70–99)
Glucose-Capillary: 110 mg/dL — ABNORMAL HIGH (ref 70–99)

## 2019-05-23 LAB — LACTIC ACID, PLASMA
Lactic Acid, Venous: 1.5 mmol/L (ref 0.5–1.9)
Lactic Acid, Venous: 0.8 mmol/L (ref 0.5–1.9)

## 2019-05-23 LAB — TROPONIN I (HIGH SENSITIVITY)
Troponin I (High Sensitivity): 15 ng/L (ref <18)
Troponin I (High Sensitivity): 8 ng/L (ref <18)

## 2019-05-23 LAB — BRAIN NATRIURETIC PEPTIDE: B Natriuretic Peptide: 273 pg/mL — ABNORMAL HIGH (ref 0.0–100.0)

## 2019-05-23 LAB — MAGNESIUM: Magnesium: 1.6 mg/dL — ABNORMAL LOW (ref 1.7–2.4)

## 2019-05-23 MED ORDER — IPRATROPIUM-ALBUTEROL 0.5-2.5 (3) MG/3ML IN SOLN
3.0000 mL | Freq: Two times a day (BID) | RESPIRATORY_TRACT | Status: DC
  Filled 2019-05-23: qty 3

## 2019-05-23 MED ORDER — ACETAMINOPHEN 325 MG PO TABS
650.0000 mg | ORAL_TABLET | Freq: Four times a day (QID) | ORAL | Status: DC | PRN
  Administered 2019-05-23 – 2019-05-28 (×6): 650 mg via ORAL
  Filled 2019-05-23 (×6): qty 2

## 2019-05-23 MED ORDER — SODIUM CHLORIDE 0.9 % IV SOLN
1.0000 g | Freq: Once | INTRAVENOUS | Status: AC
  Administered 2019-05-23: 1 g via INTRAVENOUS
  Filled 2019-05-23: qty 10

## 2019-05-23 MED ORDER — DICYCLOMINE HCL 20 MG PO TABS
20.0000 mg | ORAL_TABLET | Freq: Four times a day (QID) | ORAL | Status: DC
  Administered 2019-05-23 – 2019-05-28 (×20): 20 mg via ORAL
  Filled 2019-05-23 (×22): qty 1

## 2019-05-23 MED ORDER — INSULIN ASPART 100 UNIT/ML ~~LOC~~ SOLN: 0.0000 [IU] | Freq: Every day | SUBCUTANEOUS | Status: DC

## 2019-05-23 MED ORDER — INSULIN ASPART 100 UNIT/ML ~~LOC~~ SOLN
0.0000 [IU] | Freq: Three times a day (TID) | SUBCUTANEOUS | Status: DC
  Administered 2019-05-24 – 2019-05-26 (×5): 2 [IU] via SUBCUTANEOUS
  Administered 2019-05-27: 3 [IU] via SUBCUTANEOUS
  Administered 2019-05-27 – 2019-05-28 (×3): 2 [IU] via SUBCUTANEOUS
  Administered 2019-05-28: 3 [IU] via SUBCUTANEOUS
  Administered 2019-05-28: 2 [IU] via SUBCUTANEOUS
  Filled 2019-05-23 (×11): qty 1

## 2019-05-23 MED ORDER — LORAZEPAM 0.5 MG PO TABS: 0.5000 mg | ORAL_TABLET | Freq: Four times a day (QID) | ORAL | Status: DC | PRN

## 2019-05-23 MED ORDER — FUROSEMIDE 10 MG/ML IJ SOLN
20.0000 mg | Freq: Once | INTRAMUSCULAR | Status: AC
  Administered 2019-05-23: 20 mg via INTRAVENOUS
  Filled 2019-05-23: qty 4

## 2019-05-23 MED ORDER — IRBESARTAN 150 MG PO TABS
75.0000 mg | ORAL_TABLET | Freq: Every day | ORAL | Status: DC
  Administered 2019-05-23 – 2019-05-28 (×6): 75 mg via ORAL
  Filled 2019-05-23 (×6): qty 1

## 2019-05-23 MED ORDER — SODIUM CHLORIDE 0.9 % IV SOLN
250.0000 mL | INTRAVENOUS | Status: DC | PRN
  Administered 2019-05-24: 19:00:00 250 mL via INTRAVENOUS

## 2019-05-23 MED ORDER — KETOTIFEN FUMARATE 0.025 % OP SOLN: 1.0000 [drp] | Freq: Two times a day (BID) | OPHTHALMIC | Status: DC

## 2019-05-23 MED ORDER — ONDANSETRON HCL 4 MG/2ML IJ SOLN
4.0000 mg | Freq: Four times a day (QID) | INTRAMUSCULAR | Status: DC | PRN
  Administered 2019-05-27: 4 mg via INTRAVENOUS
  Filled 2019-05-23 (×4): qty 2

## 2019-05-23 MED ORDER — QUETIAPINE FUMARATE 25 MG PO TABS
12.5000 mg | ORAL_TABLET | Freq: Every day | ORAL | Status: DC
  Administered 2019-05-24 – 2019-05-28 (×5): 12.5 mg via ORAL
  Filled 2019-05-23 (×5): qty 1

## 2019-05-23 MED ORDER — METOPROLOL TARTRATE 25 MG PO TABS
25.0000 mg | ORAL_TABLET | Freq: Two times a day (BID) | ORAL | Status: DC
  Administered 2019-05-23 – 2019-05-28 (×10): 25 mg via ORAL
  Filled 2019-05-23 (×10): qty 1

## 2019-05-23 MED ORDER — PANTOPRAZOLE SODIUM 40 MG PO TBEC
40.0000 mg | DELAYED_RELEASE_TABLET | Freq: Every day | ORAL | Status: DC
  Administered 2019-05-24 – 2019-05-28 (×5): 40 mg via ORAL
  Filled 2019-05-23 (×5): qty 1

## 2019-05-23 MED ORDER — ALUM & MAG HYDROXIDE-SIMETH 200-200-20 MG/5ML PO SUSP: 30.0000 mL | ORAL | Status: DC | PRN

## 2019-05-23 MED ORDER — SODIUM CHLORIDE 0.9 % IV SOLN
1.0000 g | INTRAVENOUS | Status: DC
  Administered 2019-05-24: 1 g via INTRAVENOUS
  Filled 2019-05-23: qty 1
  Filled 2019-05-23: qty 10

## 2019-05-23 MED ORDER — LORATADINE 10 MG PO TABS
10.0000 mg | ORAL_TABLET | Freq: Every day | ORAL | Status: DC
  Administered 2019-05-24 – 2019-05-28 (×5): 10 mg via ORAL
  Filled 2019-05-23 (×5): qty 1

## 2019-05-23 MED ORDER — FLUTICASONE PROPIONATE 50 MCG/ACT NA SUSP
1.0000 | Freq: Every day | NASAL | Status: DC
  Administered 2019-05-25 – 2019-05-28 (×4): 1 via NASAL
  Filled 2019-05-23: qty 16

## 2019-05-23 MED ORDER — ENOXAPARIN SODIUM 40 MG/0.4ML ~~LOC~~ SOLN
40.0000 mg | SUBCUTANEOUS | Status: DC
  Administered 2019-05-23 – 2019-05-27 (×5): 40 mg via SUBCUTANEOUS
  Filled 2019-05-23 (×5): qty 0.4

## 2019-05-23 MED ORDER — ONDANSETRON HCL 4 MG PO TABS
4.0000 mg | ORAL_TABLET | Freq: Four times a day (QID) | ORAL | Status: DC | PRN
  Administered 2019-05-24: 4 mg via ORAL
  Filled 2019-05-23 (×2): qty 1

## 2019-05-23 MED ORDER — SODIUM CHLORIDE 0.9% FLUSH: 3.0000 mL | INTRAVENOUS | Status: DC | PRN

## 2019-05-23 MED ORDER — DIVALPROEX SODIUM ER 250 MG PO TB24
250.0000 mg | ORAL_TABLET | Freq: Every day | ORAL | Status: DC
  Administered 2019-05-23 – 2019-05-27 (×5): 250 mg via ORAL
  Filled 2019-05-23 (×6): qty 1

## 2019-05-23 MED ORDER — ACETAMINOPHEN 650 MG RE SUPP: 650.0000 mg | Freq: Four times a day (QID) | RECTAL | Status: DC | PRN

## 2019-05-23 MED ORDER — LOPERAMIDE HCL 2 MG PO CAPS: 2.0000 mg | ORAL_CAPSULE | Freq: Every day | ORAL | Status: DC | PRN

## 2019-05-23 MED ORDER — AMLODIPINE BESYLATE 5 MG PO TABS
5.0000 mg | ORAL_TABLET | Freq: Every day | ORAL | Status: DC
  Administered 2019-05-24 – 2019-05-28 (×5): 5 mg via ORAL
  Filled 2019-05-23 (×5): qty 1

## 2019-05-23 MED ORDER — METFORMIN HCL ER 500 MG PO TB24
500.0000 mg | ORAL_TABLET | Freq: Two times a day (BID) | ORAL | Status: DC
  Administered 2019-05-24 – 2019-05-28 (×10): 500 mg via ORAL
  Filled 2019-05-23 (×10): qty 1

## 2019-05-23 MED ORDER — OLOPATADINE HCL 0.1 % OP SOLN
1.0000 [drp] | Freq: Every day | OPHTHALMIC | Status: DC
  Administered 2019-05-24 – 2019-05-28 (×5): 1 [drp] via OPHTHALMIC
  Filled 2019-05-23: qty 5

## 2019-05-23 MED ORDER — ASPIRIN EC 81 MG PO TBEC
81.0000 mg | DELAYED_RELEASE_TABLET | Freq: Every day | ORAL | Status: DC
  Administered 2019-05-24 – 2019-05-28 (×5): 81 mg via ORAL
  Filled 2019-05-23 (×5): qty 1

## 2019-05-23 MED ORDER — SODIUM CHLORIDE 0.9% FLUSH
3.0000 mL | Freq: Two times a day (BID) | INTRAVENOUS | Status: DC
  Administered 2019-05-23 – 2019-05-28 (×9): 3 mL via INTRAVENOUS

## 2019-05-23 MED ORDER — ADULT MULTIVITAMIN W/MINERALS CH
1.0000 | ORAL_TABLET | Freq: Every day | ORAL | Status: DC
  Administered 2019-05-24 – 2019-05-28 (×5): 1 via ORAL
  Filled 2019-05-23 (×5): qty 1

## 2019-05-23 MED ORDER — MAGNESIUM HYDROXIDE 400 MG/5ML PO SUSP: 30.0000 mL | Freq: Every day | ORAL | Status: DC | PRN

## 2019-05-23 NOTE — H&P (Signed)
[facility] [place] [facility]   PATIENT NAME: [name]    MR#:  [id number]  DATE OF BIRTH:  1931-12-14  DATE OF ADMISSION:  05/23/2019  PRIMARY CARE PHYSICIAN: [name], Marshall W, MD   REQUESTING/REFERRING PHYSICIAN:   CHIEF COMPLAINT:   Chief Complaint  Patient presents with  . Shortness of Breath    HISTORY OF PRESENT ILLNESS: [name]  is a 85 y.o. female with a known history of type 2 diabetes mellitus, GERD, hyperlipidemia, neuropathy, anxiety disorder presented to the emergency room for difficulty breathing.  She was short of breath and had low oxygen saturation at presentation.  Initially she was put on nonrebreather mask and later on weaned to oxygen via nasal cannula at 2 L.  Work-up in the emergency room chest x-ray showed vascular congestion.  Urinalysis showed infection.  Has some dysuria.  COVID-19 test negative.  No complaints of chest pain.  Comfortable on oxygen by nasal cannula at 2 L.  PAST MEDICAL HISTORY:   Past Medical History:  Diagnosis Date  . Anxiety   . Depression   . Diabetes mellitus without complication (HCC)   . GERD (gastroesophageal reflux disease)   . Hip fracture (HCC)   . Hyperlipemia   . Neuropathy   . Vertigo     PAST SURGICAL HISTORY:  Past Surgical History:  Procedure Laterality Date  . ABDOMINAL HYSTERECTOMY    . APPENDECTOMY    . BACK SURGERY     tumor removal bengin  . CHOLECYSTECTOMY      SOCIAL HISTORY:  Social History   Tobacco Use  . Smoking status: Never Smoker  . Smokeless tobacco: Never Used  Substance Use Topics  . Alcohol use: No    Alcohol/week: 0.0 standard drinks    FAMILY HISTORY:  Family History  Problem Relation Age of Onset  . Diabetes Mother   . Diabetes Sister        x2  . Breast cancer Sister   . Heart disease Father   . Heart disease Brother        x3  . Bladder Cancer Neg Hx   . Kidney cancer Neg Hx   . Prostate cancer Neg Hx     DRUG ALLERGIES:   Allergies  Allergen Reactions  . Ampicillin     Zpac  . Sulfa Antibiotics     REVIEW OF SYSTEMS:   CONSTITUTIONAL: No fever,has fatigue and weakness.  EYES: No blurred or double vision.  EARS, NOSE, AND THROAT: No tinnitus or ear pain.  RESPIRATORY: No cough, has shortness of breath,  No wheezing or hemoptysis.  CARDIOVASCULAR: No chest pain,  Has orthopnea, no edema.  GASTROINTESTINAL: No nausea, vomiting, diarrhea or abdominal pain.  GENITOURINARY: Has dysuria, no hematuria.  ENDOCRINE: No polyuria, nocturia,  HEMATOLOGY: No anemia, easy bruising or bleeding SKIN: No rash or lesion. MUSCULOSKELETAL: No joint pain or arthritis.   NEUROLOGIC: No tingling, numbness, weakness.  PSYCHIATRY: No anxiety or depression.   MEDICATIONS AT HOME:  Prior to Admission medications   Medication Sig Start Date End Date Taking? Authorizing Provider  acetaminophen (TYLENOL) 325 MG tablet Take 650 mg by mouth every 4 (four) hours as needed for mild pain or moderate pain.    Yes [provider]  alum & mag hydroxide-simeth (MAALOX/MYLANTA) 200-200-20 MG/5ML suspension Take 30 mLs by mouth as needed for indigestion or heartburn.   Yes [provider]  amLODipine (NORVASC) 5 MG tablet Take 5 mg by mouth  daily.   Yes [provider]  aspirin EC 81 MG tablet Take 81 mg by mouth daily.   Yes [provider]  bismuth subsalicylate (PEPTO BISMOL) 262 MG chewable tablet Chew 262 mg by mouth as needed.   Yes [provider]  calcium carbonate (TUMS - DOSED IN MG ELEMENTAL CALCIUM) 500 MG chewable tablet Chew 2 tablets (400 mg of elemental calcium total) by mouth 3 (three) times daily as needed for indigestion or heartburn. 08/23/18  Yes Wieting, Richard, MD  cetirizine (ZYRTEC) 10 MG tablet Take 10 mg by mouth daily.   Yes [provider]  dicyclomine (BENTYL) 20 MG tablet Take 20 mg by mouth 4 (four) times daily.   Yes [provider]   divalproex (DEPAKOTE ER) 250 MG 24 hr tablet Take 250 mg by mouth at bedtime.   Yes [provider]  esomeprazole (NEXIUM) 20 MG capsule Take 20 mg by mouth daily.    Yes [provider]  fluticasone (FLONASE) 50 MCG/ACT nasal spray Place 1 spray into both nostrils daily.    Yes [provider]  insulin aspart (NOVOLOG) 100 UNIT/ML injection 3 units with each meal if she eats Patient taking differently: Ss 201-250= 2u; 251-300= 4u; 301-350= 6u; 351-400= 10u; notify md if >70 or <400 08/23/18  Yes Wieting, Richard, MD  ipratropium-albuterol (DUONEB) 0.5-2.5 (3) MG/3ML SOLN Take 3 mLs by nebulization 2 (two) times daily. 08/23/18  Yes Wieting, Richard, MD  ketotifen (ZADITOR) 0.025 % ophthalmic solution Place 1 drop into both eyes 2 (two) times daily.    Yes [provider]  loperamide (IMODIUM) 2 MG capsule Take 2 mg by mouth daily as needed for diarrhea or loose stools.    Yes [provider]  LORazepam (ATIVAN) 0.5 MG tablet Take 0.5 mg by mouth every 8 (eight) hours.   Yes [provider]  magnesium hydroxide (MILK OF MAGNESIA) 400 MG/5ML suspension Take 30 mLs by mouth daily as needed for mild constipation.   Yes [provider]  metFORMIN (GLUCOPHAGE-XR) 500 MG 24 hr tablet Take 500 mg by mouth 2 (two) times a day.   Yes [provider]  metoprolol tartrate (LOPRESSOR) 25 MG tablet Take 1 tablet (25 mg total) by mouth 2 (two) times daily. 08/23/18  Yes Wieting, Richard, MD  Multiple Vitamin (MULTIVITAMIN WITH MINERALS) TABS tablet Take 1 tablet by mouth daily. 08/23/18  Yes Wieting, Richard, MD  olopatadine (PATANOL) 0.1 % ophthalmic solution Place 1 drop into both eyes daily.   Yes [provider]  ondansetron (ZOFRAN) 4 MG tablet Take 4 mg by mouth every 8 (eight) hours as needed for nausea or vomiting.   Yes [provider]  psyllium (METAMUCIL) 58.6 % powder Take 1 packet by mouth 2 (two) times a day.    Yes [provider]  QUEtiapine (SEROQUEL) 25 MG tablet Take 1 tablet (25 mg total) by mouth at bedtime as needed (psychosis, insomnia). Patient taking differently: Take 12.5 mg by mouth daily.  08/23/18  Yes Wieting, Richard, MD  sodium phosphate Pediatric (FLEET) 3.5-9.5 GM/59ML enema Place 1 enema rectally once as needed for severe constipation.   Yes [provider]  telmisartan (MICARDIS) 40 MG tablet Take 40 mg by mouth daily.    Yes [provider]      PHYSICAL EXAMINATION:   VITAL SIGNS: Blood pressure (!) 118/55, pulse 64, temperature 98.3 F (36.8 C), temperature source Oral, resp. rate (!) 28, height 5\' 5"  (1.651 m),  weight 78.5 kg, SpO2 93 %.  GENERAL:  85 y.o.-year-old patient lying in the bed with no acute distress.  EYES: Pupils equal, round, reactive to light and accommodation. No scleral icterus. Extraocular muscles intact.  HEENT: Head atraumatic, normocephalic. Oropharynx and nasopharynx clear.  NECK:  Supple, no jugular venous distention. No thyroid enlargement, no tenderness.  LUNGS: Decreased breath sounds bilaterally, bibasilar crepitations heard. No use of accessory muscles of respiration.  CARDIOVASCULAR: S1, S2 normal. No murmurs, rubs, or gallops.  ABDOMEN: Soft, nontender, nondistended. Bowel sounds present. No organomegaly or mass.  EXTREMITIES: No pedal edema, cyanosis, or clubbing.  NEUROLOGIC: Cranial nerves II through XII are intact. Muscle strength 5/5 in all extremities. Sensation intact. Gait not checked.  PSYCHIATRIC: The patient is alert and oriented x 3.  SKIN: No obvious rash, lesion, or ulcer.   LABORATORY PANEL:   CBC Recent Labs  Lab 05/23/19 1036  WBC 9.1  HGB 11.0*  HCT 36.8  PLT 214  MCV 85.4  MCH 25.5*  MCHC 29.9*  RDW 14.3  LYMPHSABS 1.3  MONOABS 0.8  EOSABS 0.2  BASOSABS 0.0    ------------------------------------------------------------------------------------------------------------------  Chemistries  Recent Labs  Lab 05/23/19 1036  NA 142  K 4.4  CL 109  CO2 27  GLUCOSE 134*  BUN 17  CREATININE 0.69  CALCIUM 9.2  MG 1.6*  AST 14*  ALT 19  ALKPHOS 93  BILITOT 0.5   ------------------------------------------------------------------------------------------------------------------ estimated creatinine clearance is 54.2 mL/min (by C-G formula based on SCr of 0.69 mg/dL). ------------------------------------------------------------------------------------------------------------------ No results for input(s): TSH, T4TOTAL, T3FREE, THYROIDAB in the last 72 hours.  Invalid input(s): FREET3   Coagulation profile No results for input(s): INR, PROTIME in the last 168 hours. ------------------------------------------------------------------------------------------------------------------- No results for input(s): DDIMER in the last 72 hours. -------------------------------------------------------------------------------------------------------------------  Cardiac Enzymes No results for input(s): CKMB, TROPONINI, MYOGLOBIN in the last 168 hours.  Invalid input(s): CK ------------------------------------------------------------------------------------------------------------------ Invalid input(s): POCBNP  ---------------------------------------------------------------------------------------------------------------  Urinalysis    Component Value Date/Time   COLORURINE YELLOW (A) 05/23/2019 1025   APPEARANCEUR CLOUDY (A) 05/23/2019 1025   APPEARANCEUR Clear 05/10/2017 0932   LABSPEC 1.021 05/23/2019 1025   LABSPEC 1.038 06/17/2014 1357   PHURINE 6.0 05/23/2019 1025   GLUCOSEU NEGATIVE 05/23/2019 1025   GLUCOSEU Negative 06/17/2014 1357   HGBUR NEGATIVE 05/23/2019 1025   BILIRUBINUR NEGATIVE 05/23/2019 1025   BILIRUBINUR Negative 05/10/2017  0932   BILIRUBINUR Negative 06/17/2014 1357   KETONESUR NEGATIVE 05/23/2019 1025   PROTEINUR 100 (A) 05/23/2019 1025   NITRITE NEGATIVE 05/23/2019 1025   LEUKOCYTESUR LARGE (A) 05/23/2019 1025   LEUKOCYTESUR Negative 06/17/2014 1357     RADIOLOGY: Dg Chest Port 1 View  Result Date: 05/23/2019 CLINICAL DATA:  Short of breath EXAM: PORTABLE CHEST 1 VIEW COMPARISON:  08/20/2018 FINDINGS: Cardiac enlargement with pulmonary vascular congestion. Overall improved aeration compared to the prior study. No pleural effusion. Surgical clips right paratracheal region. IMPRESSION: Cardiac enlargement with mild vascular congestion. Negative for edema or pneumonia. Electronically Signed   By: Marlan Palauharles  Clark M.D.   On: 05/23/2019 11:11    EKG: Orders placed or performed during the hospital encounter of 05/23/19  . EKG 12-Lead  . EKG 12-Lead  . ED EKG  . ED EKG    IMPRESSION AND PLAN: 85 year old female patient with a known history of type 2 diabetes mellitus, GERD, hyperlipidemia, neuropathy, anxiety disorder presented to the emergency room for difficulty breathing.    -Acute respiratory distress with hypoxia Wean down the oxygen via nasal cannula 2  L Secondary to fluid overload pulmonary edema Diuresis with Lasix cautiously  -Fluid overload IV Lasix given for diuresis Check echocardiogram  -Urinary tract infection IV Rocephin antibiotic daily Check cultures  -Type 2 diabetes mellitus Diabetic diet with sliding scale coverage with insulin  -Hypertension Continue beta-blocker  -DVT prophylaxis subcu Lovenox daily  All the records are reviewed and case discussed with ED provider. Management plans discussed with the patient, family and they are in agreement.  CODE STATUS:DNR Code Status History    Date Active Date Inactive Code Status Order ID Comments User Context   08/11/2018 2201 08/24/2018 2045 DNR [id number]  Bertrum SolSalary, Montell D, MD Inpatient   05/21/2017 2338 05/24/2017 2208 DNR  [id number]  Oralia ManisWillis, David, MD Inpatient   Advance Care Planning Activity    Questions for Most Recent Historical Code Status (Order [id number])    Question Answer Comment   In the event of cardiac or respiratory ARREST Do not call a "code blue"    In the event of cardiac or respiratory ARREST Do not perform Intubation, CPR, defibrillation or ACLS    In the event of cardiac or respiratory ARREST Use medication by any route, position, wound care, and other measures to relive pain and suffering. May use oxygen, suction and manual treatment of airway obstruction as needed for comfort.    Comments Nurse may pronounce        TOTAL TIME TAKING CARE OF THIS PATIENT: 52 minutes.    Ihor AustinPavan Pyreddy M.D on 05/23/2019 at 3:37 PM  Between 7am to 6pm - Pager - [phone]  After 6pm go to [email or web address] - password [email or web address] [facility]  [facility]  Office  [phone]  CC: Primary care physician; [name], Marshall W, MD

## 2019-05-23 NOTE — ED Provider Notes (Signed)
[facility] Emergency Department Provider Note  ____________________________________________   First MD Initiated Contact with Patient 05/23/19 1023     (approximate)  I have reviewed the triage vital signs and the nursing notes.   HISTORY  Chief Complaint Shortness of Breath    HPI [name] is a 85 y.o. female with diabetes coming from a nursing home for weakness for the past 4 days.  Patient has been feeling unwell for 4 days she says that she has not been feeling like her normal self.  She is having more fatigue difficulty ambulating.  Patient was noted to be hypoxic with EMS.  Patient was satting in the 80s on room air.  Patient was initially placed on a nonrebreather.  Patient does endorse some pain with urination.  History is somewhat limited secondary to patient's baseline mental status.   Past Medical History:  Diagnosis Date  . Anxiety   . Depression   . Diabetes mellitus without complication ([place])   . GERD (gastroesophageal reflux disease)   . Hip fracture ([place])   . Hyperlipemia   . Neuropathy   . Vertigo     Patient Active Problem List   Diagnosis Date Noted  . Acute delirium 08/16/2018  . Dementia with behavioral disturbance ([place]) 08/16/2018  . Encephalopathy acute 08/11/2018  . Sepsis ([place]) 05/21/2017  . Aspiration pneumonia ([place]) 05/21/2017  . GERD (gastroesophageal reflux disease) 05/21/2017  . HLD (hyperlipidemia) 05/21/2017  . Depression 05/21/2017  . Anxiety 05/21/2017  . Elevated troponin 05/21/2017  . Diabetes ([place]) 05/21/2017  . Right humeral fracture 05/21/2017  . Nausea and vomiting 03/31/2015  . Microcytic anemia 03/31/2015    Past Surgical History:  Procedure Laterality Date  . ABDOMINAL HYSTERECTOMY    . APPENDECTOMY    . BACK SURGERY     tumor removal bengin  . CHOLECYSTECTOMY      Prior to Admission medications   Medication Sig Start Date End Date Taking? Authorizing Provider  acetaminophen  (TYLENOL) 325 MG tablet Take 650 mg by mouth every 4 (four) hours as needed for mild pain or moderate pain.     [provider]  amLODipine (NORVASC) 10 MG tablet Take 1 tablet (10 mg total) by mouth daily. 08/25/18   Loletha Grayer, MD  aspirin EC 81 MG tablet Take 81 mg by mouth daily.    [provider]  calcium carbonate (TUMS - DOSED IN MG ELEMENTAL CALCIUM) 500 MG chewable tablet Chew 2 tablets (400 mg of elemental calcium total) by mouth 3 (three) times daily as needed for indigestion or heartburn. 08/23/18   Loletha Grayer, MD  cetirizine (ZYRTEC) 10 MG tablet Take 10 mg by mouth daily.    [provider]  dicyclomine (BENTYL) 20 MG tablet Take 20 mg by mouth 4 (four) times daily.    [provider]  esomeprazole (NEXIUM) 20 MG capsule Take 20 mg by mouth daily at 12 noon.    [provider]  fluticasone (FLONASE) 50 MCG/ACT nasal spray Place 1 spray into both nostrils daily.     [provider]  insulin aspart (NOVOLOG) 100 UNIT/ML injection 3 units with each meal if she eats 08/23/18   Loletha Grayer, MD  ipratropium-albuterol (DUONEB) 0.5-2.5 (3) MG/3ML SOLN Take 3 mLs by nebulization 2 (two) times daily. 08/23/18   Loletha Grayer, MD  ketotifen (ZADITOR) 0.025 % ophthalmic solution Place 1 drop into both eyes 2 (two) times daily.     [provider]  Lactobacillus Rhamnosus, GG, (CULTURELLE) CAPS Take 1 capsule by mouth daily.    [provider]  loperamide (IMODIUM) 2 MG capsule Take 2 mg by mouth daily as needed for diarrhea or loose stools.     [provider]  metoprolol tartrate (LOPRESSOR) 25 MG tablet Take 1 tablet (25 mg total) by mouth 2 (two) times daily. 08/23/18   Alford HighlandWieting, Richard, MD  Multiple Vitamin (MULTIVITAMIN WITH MINERALS) TABS tablet Take 1 tablet by mouth daily. 08/23/18   Alford HighlandWieting, Richard, MD  olopatadine (PATANOL) 0.1 % ophthalmic solution Place 1 drop into both eyes daily.     [provider]  ondansetron (ZOFRAN) 4 MG tablet Take 4 mg by mouth every 8 (eight) hours as needed for nausea or vomiting.    [provider]  QUEtiapine (SEROQUEL) 25 MG tablet Take 1 tablet (25 mg total) by mouth at bedtime as needed (psychosis, insomnia). 08/23/18   Alford HighlandWieting, Richard, MD  telmisartan (MICARDIS) 40 MG tablet Take 40 mg by mouth daily.     [provider]    Allergies Ampicillin and Sulfa antibiotics  Family History  Problem Relation Age of Onset  . Diabetes Mother   . Diabetes Sister        x2  . Breast cancer Sister   . Heart disease Father   . Heart disease Brother        x3  . Bladder Cancer Neg Hx   . Kidney cancer Neg Hx   . Prostate cancer Neg Hx     Social History Social History   Tobacco Use  . Smoking status: Never Smoker  . Smokeless tobacco: Never Used  Substance Use Topics  . Alcohol use: No    Alcohol/week: 0.0 standard drinks  . Drug use: No      Review of Systems Constitutional: No fever/chills Eyes: No visual changes. ENT: No sore throat. Cardiovascular: No chest pain Respiratory: Positive for SOB Gastrointestinal: No abdominal pain.  No nausea, no vomiting.  No diarrhea.  No constipation. Genitourinary: Positive for dysuria Musculoskeletal: Negative for back pain. Skin: Negative for rash. Neurological: Negative for headaches, focal weakness or numbness. All other ROS negative  ____________________________________________   PHYSICAL EXAM:  Blood pressure (!) 151/65, pulse 78, temperature 98.3 F (36.8 C), temperature source Oral, resp. rate (!) 25, height 5\' 5"  (1.651 m), weight 78.5 kg, SpO2 97 %.  Constitutional: Alert and oriented. Well appearing and in no acute distress.  Elderly female appearing Eyes: Conjunctivae are normal. EOMI. Head: Atraumatic. Nose: No congestion/rhinnorhea. Mouth/Throat: Mucous membranes are moist.   Neck: No stridor. Trachea Midline. FROM Cardiovascular: Normal  rate, regular rhythm. Grossly normal heart sounds.  Good peripheral circulation. Respiratory: Mild increased work of breathing, clear lungs Gastrointestinal: Soft and nontender. No distention. No abdominal bruits.  Mild suprapubic tenderness Musculoskeletal: No lower extremity tenderness nor edema.  No joint effusions. Neurologic:  Normal speech and language. No gross focal neurologic deficits are appreciated.  Alert and oriented x3 Skin:  Skin is warm, dry and intact. No rash noted. Psychiatric: Mood and affect are normal. Speech and behavior are normal. GU: Deferred   ____________________________________________   LABS (all labs ordered are listed, but only abnormal results are displayed)  Labs Reviewed - No data to display ____________________________________________   ED ECG REPORT I, Concha SeMary E Tyron Manetta, the attending physician, personally viewed and interpreted this ECG.  EKG sinus , poor tracing but no obvious ST elevation, right bundle branch block, normal axis ____________________________________________  RADIOLOGY I,  Concha SeMary E Shawnetta Lein, personally viewed and evaluated these images (plain radiographs) as part of my medical decision making, as well as reviewing the written report by the radiologist.  ED MD interpretation: Chest x-ray reviewed and without any evidence of pneumonia.  Official radiology report(s): Dg Chest Port 1 View  Result Date: 05/23/2019 CLINICAL DATA:  Short of breath EXAM: PORTABLE CHEST 1 VIEW COMPARISON:  08/20/2018 FINDINGS: Cardiac enlargement with pulmonary vascular congestion. Overall improved aeration compared to the prior study. No pleural effusion. Surgical clips right paratracheal region. IMPRESSION: Cardiac enlargement with mild vascular congestion. Negative for edema or pneumonia. Electronically Signed   By: Marlan Palauharles  Clark M.D.   On: 05/23/2019 11:11    ____________________________________________   PROCEDURES  Procedure(s) performed (including  Critical Care):  Procedures   ____________________________________________   INITIAL IMPRESSION / ASSESSMENT AND PLAN / ED COURSE   Rozelia Laury Deepell Snowberger was evaluated in Emergency Department on 05/23/2019 for the symptoms described in the history of present illness. She was evaluated in the context of the global COVID-19 pandemic, which necessitated consideration that the patient might be at risk for infection with the SARS-CoV-2 virus that causes COVID-19. Institutional protocols and algorithms that pertain to the evaluation of patients at risk for COVID-19 are in a state of rapid change based on information released by regulatory bodies including the CDC and federal and state organizations. These policies and algorithms were followed during the patient's care in the ED.     Pt presents with SOB. Differential includes: PNA-will get xray to evaluation Anemia-CBC to evaluate ACS- will get trops Arrhythmia-Will get EKG and keep on monitor.  COVID- will get testing per algorithm.  Patient also has some urinary pain and little bit of suprapubic tenderness therefore will get a UA to evaluate for UTI.  COVID test was negative  Troponins were reassuring with initial at 15 downtrending to 8.  Blood cultures and lactate were ordered however patient technically does not meet sirs criteria.  UA is concerning for UTI.  Ceftriaxone started.  Patient was able to be weaned down to 2 L of oxygen.  Hypoxia potentially secondary to a little bit of fluid overload.  She may need inpatient echo.  However will admit to medicine for further treatment and work-up.      ____________________________________________   FINAL CLINICAL IMPRESSION(S) / ED DIAGNOSES   Final diagnoses:  Hypoxia  Acute cystitis without hematuria     MEDICATIONS GIVEN DURING THIS VISIT:  Medications  cefTRIAXone (ROCEPHIN) 1 g in sodium chloride 0.9 % 100 mL IVPB (1 g Intravenous New Bag/Given 05/23/19 1537)  cefTRIAXone  (ROCEPHIN) 1 g in sodium chloride 0.9 % 100 mL IVPB (has no administration in time range)  insulin aspart (novoLOG) injection 0-15 Units (has no administration in time range)  insulin aspart (novoLOG) injection 0-5 Units (has no administration in time range)  furosemide (LASIX) injection 20 mg (20 mg Intravenous Given 05/23/19 1534)     ED Discharge Orders    None       Note:  This document was prepared using Dragon voice recognition software and may include unintentional dictation errors.   Concha SeFunke, Jaylene Schrom E, MD 05/23/19 [phone]

## 2019-05-23 NOTE — ED Notes (Signed)
ED Provider at bedside. 

## 2019-05-23 NOTE — ED Notes (Signed)
This RN spoke with RN at [facility] and informed her of plan to admit.

## 2019-05-23 NOTE — ED Triage Notes (Signed)
Patient arrived via [facility] on 15L [facility] with oxygen saturation 100. Blood glucose in 200s on arrival and patient received 4 Units of insulin at [facility]. [facility] staff report a 4 day progressive decline in strength and mentation. At baseline patient can walk. Per EMS report she can only stand now and does not take steps. Pt transitioned to 4L [place] on arrival to [facility] and oxygen saturation is 98% RR 25.

## 2019-05-23 NOTE — ED Notes (Signed)
ED TO INPATIENT HANDOFF REPORT  ED Nurse Name and Phone #:  Selena BattenKim [phone]  S Name/Age/Gender [name] 85 y.o. female Room/Bed: ED11A/ED11A  Code Status   Code Status: Prior  Home/SNF/Other Skilled nursing facility Patient oriented to: self, place, time and situation Is this baseline? Yes   Triage Complete: Triage complete  Chief Complaint Sob, weakness  Triage Note Patient arrived via [facility] on 15L NR[facility] with oxygen saturation 100. Blood glucose in 200s on arrival and patient received 4 Units of insulin at [facility]. [facility] staff report a 4 day progressive decline in strength and mentation. At baseline patient can walk. Per EMS report she can only stand now and does not take steps. Pt transitioned to 4L [place] on arrival to [facility] and oxygen saturation is 98% RR 25.   Allergies Allergies  Allergen Reactions  . Ampicillin     Zpac  . Sulfa Antibiotics     Level of Care/Admitting Diagnosis ED Disposition    ED Disposition Condition Comment   Admit  [facility] [[id number]]  Level of Care: Med-Surg [16]  Covid Evaluation: Confirmed COVID Negative  Diagnosis: Hypoxia [300808]  Admitting Physician: Ihor AustinPYREDDY, PAVAN [[id number]]  Attending Physician: Ihor AustinPYREDDY, PAVAN [[id number]]  Estimated length of stay: past midnight tomorrow  Certification:: I certify this patient will need inpatient services for at least 2 midnights  PT Class (Do Not Modify): Inpatient [101]  PT Acc Code (Do Not Modify): Private [1]       B Medical/Surgery History Past Medical History:  Diagnosis Date  . Anxiety   . Depression   . Diabetes mellitus without complication (H[facility])   . GERD (gastroesophageal reflux disease)   . Hip fracture ([facility])   . Hyperlipemia   . Neuropathy   . Vertigo    Past Surgical History:  Procedure Laterality Date  . ABDOMINAL HYSTERECTOMY    . APPENDECTOMY    . BACK SURGERY     tumor removal bengin  . CHOLECYSTECTOMY       A IV  Location/Drains/Wounds Patient Lines/Drains/Airways Status   Active Line/Drains/Airways    Name:   Placement date:   Placement time:   Site:   Days:   Peripheral IV 05/23/19 Left Antecubital   05/23/19    1025    Antecubital   less than 1   External Urinary Catheter   08/14/18    0055    -   282          Intake/Output Last 24 hours  Intake/Output Summary (Last 24 hours) at 05/23/2019 1800 Last data filed at 05/23/2019 1731 Gross per 24 hour  Intake -  Output 175 ml  Net -175 ml    Labs/Imaging Results for orders placed or performed during the hospital encounter of 05/23/19 (from the past 48 hour(s))  Urinalysis, Complete w Microscopic     Status: Abnormal   Collection Time: 05/23/19 10:25 AM  Result Value Ref Range   Color, Urine YELLOW (A) YELLOW   APPearance CLOUDY (A) CLEAR   Specific Gravity, Urine 1.021 1.005 - 1.030   pH 6.0 5.0 - 8.0   Glucose, UA NEGATIVE NEGATIVE mg/dL   Hgb urine dipstick NEGATIVE NEGATIVE   Bilirubin Urine NEGATIVE NEGATIVE   Ketones, ur NEGATIVE NEGATIVE mg/dL   Protein, ur 284100 (A) NEGATIVE mg/dL   Nitrite NEGATIVE NEGATIVE   Leukocytes,Ua LARGE (A) NEGATIVE   RBC / HPF 21-50 0 - 5 RBC/hpf   WBC, UA >50 (H) 0 - 5  WBC/hpf   Bacteria, UA MANY (A) NONE SEEN   Squamous Epithelial / LPF 0-5 0 - 5    Comment: Performed at [facility], [address] [place], [place] [address]  SARS Coronavirus 2 (CEPHEID- Performed in [facility] hospital lab), Hosp Order     Status: None   Collection Time: 05/23/19 10:26 AM   Specimen: Nasopharyngeal Swab  Result Value Ref Range   SARS Coronavirus 2 NEGATIVE NEGATIVE    Comment: (NOTE) If result is NEGATIVE SARS-CoV-2 target nucleic acids are NOT DETECTED. The SARS-CoV-2 RNA is generally detectable in upper and lower  respiratory specimens during the acute phase of infection. The lowest  concentration of SARS-CoV-2 viral copies this assay can detect is 250  copies / mL. A negative result does not  preclude SARS-CoV-2 infection  and should not be used as the sole basis for treatment or other  patient management decisions.  A negative result may occur with  improper specimen collection / handling, submission of specimen other  than nasopharyngeal swab, presence of viral mutation(s) within the  areas targeted by this assay, and inadequate number of viral copies  (<250 copies / mL). A negative result must be combined with clinical  observations, patient history, and epidemiological information. If result is POSITIVE SARS-CoV-2 target nucleic acids are DETECTED. The SARS-CoV-2 RNA is generally detectable in upper and lower  respiratory specimens dur ing the acute phase of infection.  Positive  results are indicative of active infection with SARS-CoV-2.  Clinical  correlation with patient history and other diagnostic information is  necessary to determine patient infection status.  Positive results do  not rule out bacterial infection or co-infection with other viruses. If result is PRESUMPTIVE POSTIVE SARS-CoV-2 nucleic acids MAY BE PRESENT.   A presumptive positive result was obtained on the submitted specimen  and confirmed on repeat testing.  While 2019 novel coronavirus  (SARS-CoV-2) nucleic acids may be present in the submitted sample  additional confirmatory testing may be necessary for epidemiological  and / or clinical management purposes  to differentiate between  SARS-CoV-2 and other Sarbecovirus currently known to infect humans.  If clinically indicated additional testing with an alternate test  methodology [phone]) is advised. The SARS-CoV-2 RNA is generally  detectable in upper and lower respiratory sp ecimens during the acute  phase of infection. The expected result is Negative. Fact Sheet for Patients:  BoilerBrush.com.cy Fact Sheet for Healthcare Providers: https://pope.com/ This test is not yet approved or cleared by  the [place] FDA and has been authorized for detection and/or diagnosis of SARS-CoV-2 by FDA under an Emergency Use Authorization (EUA).  This EUA will remain in effect (meaning this test can be used) for the duration of the COVID-19 declaration under Section 564(b)(1) of the Act, 21 U.S.C. section 360bbb-3(b)(1), unless the authorization is terminated or revoked sooner. Performed at [facility], [address] [place], [place] [address]   Comprehensive metabolic panel     Status: Abnormal   Collection Time: 05/23/19 10:36 AM  Result Value Ref Range   Sodium 142 135 - 145 mmol/L   Potassium 4.4 3.5 - 5.1 mmol/L   Chloride 109 98 - 111 mmol/L   CO2 27 22 - 32 mmol/L   Glucose, Bld 134 (H) 70 - 99 mg/dL   BUN 17 8 - 23 mg/dL   Creatinine, Ser 4.78 0.44 - 1.00 mg/dL   Calcium 9.2 8.9 - 29.5 mg/dL   Total Protein 7.3 6.5 - 8.1  g/dL   Albumin 3.3 (L) 3.5 - 5.0 g/dL   AST 14 (L) 15 - 41 U/L   ALT 19 0 - 44 U/L   Alkaline Phosphatase 93 38 - 126 U/L   Total Bilirubin 0.5 0.3 - 1.2 mg/dL   GFR calc non Af Amer >60 >60 mL/min   GFR calc Af Amer >60 >60 mL/min   Anion gap 6 5 - 15    Comment: Performed at [facility], [address] [place], [place] [address]  CBC WITH DIFFERENTIAL     Status: Abnormal   Collection Time: 05/23/19 10:36 AM  Result Value Ref Range   WBC 9.1 4.0 - 10.5 K/uL   RBC 4.31 3.87 - 5.11 MIL/uL   Hemoglobin 11.0 (L) 12.0 - 15.0 g/dL   HCT 60.436.8 54.036.0 - 98.146.0 %   MCV 85.4 80.0 - 100.0 fL   MCH 25.5 (L) 26.0 - 34.0 pg   MCHC 29.9 (L) 30.0 - 36.0 g/dL   RDW 19.114.3 47.811.5 - 29.515.5 %   Platelets 214 150 - 400 K/uL   nRBC 0.0 0.0 - 0.2 %   Neutrophils Relative % 76 %   Neutro Abs 6.8 1.7 - 7.7 K/uL   Lymphocytes Relative 14 %   Lymphs Abs 1.3 0.7 - 4.0 K/uL   Monocytes Relative 8 %   Monocytes Absolute 0.8 0.1 - 1.0 K/uL   Eosinophils Relative 2 %   Eosinophils Absolute 0.2 0.0 - 0.5 K/uL   Basophils Relative 0 %   Basophils Absolute 0.0  0.0 - 0.1 K/uL   Immature Granulocytes 0 %   Abs Immature Granulocytes 0.02 0.00 - 0.07 K/uL    Comment: Performed at [facility], [address] [place], [place] [address]  Brain natriuretic peptide     Status: Abnormal   Collection Time: 05/23/19 10:36 AM  Result Value Ref Range   B Natriuretic Peptide 273.0 (H) 0.0 - 100.0 pg/mL    Comment: Performed at [facility], [address] [place], [place] [address]  Troponin I (High Sensitivity)     Status: None   Collection Time: 05/23/19 10:36 AM  Result Value Ref Range   Troponin I (High Sensitivity) 15 <18 ng/L    Comment: (NOTE) Elevated high sensitivity troponin I (hsTnI) values and significant  changes across serial measurements may suggest ACS but many other  chronic and acute conditions are known to elevate hsTnI results.  Refer to the "Links" section for chest pain algorithms and additional  guidance. Performed at [facility], [address] [place], [place] [address]   Magnesium     Status: Abnormal   Collection Time: 05/23/19 10:36 AM  Result Value Ref Range   Magnesium 1.6 (L) 1.7 - 2.4 mg/dL    Comment: Performed at [facility], [address] [place], [place] [address]  Lactic acid, plasma     Status: None   Collection Time: 05/23/19 10:36 AM  Result Value Ref Range   Lactic Acid, Venous 1.5 0.5 - 1.9 mmol/L    Comment: Performed at [facility], [address] [place], [place] [address]  Lactic acid, plasma     Status: None   Collection Time: 05/23/19 12:29 PM  Result Value Ref Range   Lactic Acid, Venous 0.8 0.5 - 1.9 mmol/L    Comment: Performed at [facility], [address] [place], [place] [address]  Troponin I (High Sensitivity)     Status: None  Collection Time: 05/23/19 12:29 PM  Result Value Ref Range   Troponin I (High Sensitivity) 8 <18 ng/L    Comment: (NOTE) Elevated high sensitivity troponin I (hsTnI) values and significant   changes across serial measurements may suggest ACS but many other  chronic and acute conditions are known to elevate hsTnI results.  Refer to the "Links" section for chest pain algorithms and additional  guidance. Performed at [facility], [address] [place], [place] [address]   Glucose, capillary     Status: None   Collection Time: 05/23/19  5:27 PM  Result Value Ref Range   Glucose-Capillary 95 70 - 99 mg/dL   Dg Chest Port 1 View  Result Date: 05/23/2019 CLINICAL DATA:  Short of breath EXAM: PORTABLE CHEST 1 VIEW COMPARISON:  08/20/2018 FINDINGS: Cardiac enlargement with pulmonary vascular congestion. Overall improved aeration compared to the prior study. No pleural effusion. Surgical clips right paratracheal region. IMPRESSION: Cardiac enlargement with mild vascular congestion. Negative for edema or pneumonia. Electronically Signed   By: Marlan Palauharles  Clark M.D.   On: 05/23/2019 11:11    Pending Labs Unresulted Labs (From admission, onward)    Start     Ordered   05/23/19 1538  Hemoglobin A1c  Once,   STAT    Comments: To assess prior glycemic control    05/23/19 1537   05/23/19 1025  Blood culture (routine x 2)  BLOOD CULTURE X 2,   STAT     05/23/19 1025   Signed and Held  CBC  (enoxaparin (LOVENOX)    CrCl >/= 30 ml/min)  Once,   R    Comments: Baseline for enoxaparin therapy IF NOT ALREADY DRAWN.  Notify MD if PLT < 100 K.    Signed and Held   Signed and Held  Creatinine, serum  (enoxaparin (LOVENOX)    CrCl >/= 30 ml/min)  Once,   R    Comments: Baseline for enoxaparin therapy IF NOT ALREADY DRAWN.    Signed and Held   Signed and Held  Creatinine, serum  (enoxaparin (LOVENOX)    CrCl >/= 30 ml/min)  Weekly,   R    Comments: while on enoxaparin therapy    Signed and Held   Signed and Held  Basic metabolic panel  Tomorrow morning,   R     Signed and Held   Signed and Held  CBC  Tomorrow morning,   R     Signed and Held          Vitals/Pain Today's  Vitals   05/23/19 1422 05/23/19 1533 05/23/19 1600 05/23/19 1630  BP:  (!) 151/65 (!) 145/67 (!) 148/65  Pulse:  78 78 76  Resp:  (!) 25 (!) 27 (!) 23  Temp:      TempSrc:      SpO2:  97% 97% 99%  Weight:      Height:      PainSc: Asleep       Isolation Precautions No active isolations  Medications Medications  cefTRIAXone (ROCEPHIN) 1 g in sodium chloride 0.9 % 100 mL IVPB (has no administration in time range)  insulin aspart (novoLOG) injection 0-15 Units (0 Units Subcutaneous Not Given 05/23/19 1729)  insulin aspart (novoLOG) injection 0-5 Units (has no administration in time range)  cefTRIAXone (ROCEPHIN) 1 g in sodium chloride 0.9 % 100 mL IVPB (0 g Intravenous Stopped 05/23/19 1716)  furosemide (LASIX) injection 20 mg (20 mg Intravenous Given 05/23/19 1534)    Mobility walks  with person assist Moderate fall risk   Focused Assessments     R Recommendations: See Admitting Provider Note  Report given to:   Additional Notes:

## 2019-05-24 LAB — BASIC METABOLIC PANEL
Anion gap: 7 (ref 5–15)
BUN: 18 mg/dL (ref 8–23)
CO2: 27 mmol/L (ref 22–32)
Calcium: 9.1 mg/dL (ref 8.9–10.3)
Chloride: 109 mmol/L (ref 98–111)
Creatinine, Ser: 0.66 mg/dL (ref 0.44–1.00)
GFR calc Af Amer: >60 mL/min (ref >60)
GFR calc non Af Amer: >60 mL/min (ref >60)
Glucose, Bld: 141 mg/dL — ABNORMAL HIGH (ref 70–99)
Potassium: 4.5 mmol/L (ref 3.5–5.1)
Sodium: 143 mmol/L (ref 135–145)

## 2019-05-24 LAB — CBC
HCT: 37 % (ref 36.0–46.0)
Hemoglobin: 11 g/dL — ABNORMAL LOW (ref 12.0–15.0)
MCH: 25.9 pg — ABNORMAL LOW (ref 26.0–34.0)
MCHC: 29.7 g/dL — ABNORMAL LOW (ref 30.0–36.0)
MCV: 87.3 fL (ref 80.0–100.0)
Platelets: 200 10*3/uL (ref 150–400)
RBC: 4.24 MIL/uL (ref 3.87–5.11)
RDW: 14.2 % (ref 11.5–15.5)
WBC: 7.9 10*3/uL (ref 4.0–10.5)
nRBC: 0 % (ref 0.0–0.2)

## 2019-05-24 LAB — GLUCOSE, CAPILLARY
Glucose-Capillary: 126 mg/dL — ABNORMAL HIGH (ref 70–99)
Glucose-Capillary: 116 mg/dL — ABNORMAL HIGH (ref 70–99)
Glucose-Capillary: 131 mg/dL — ABNORMAL HIGH (ref 70–99)
Glucose-Capillary: 152 mg/dL — ABNORMAL HIGH (ref 70–99)

## 2019-05-24 MED ORDER — LORAZEPAM 2 MG/ML IJ SOLN
0.5000 mg | Freq: Four times a day (QID) | INTRAMUSCULAR | Status: DC | PRN
  Administered 2019-05-24 – 2019-05-27 (×6): 0.5 mg via INTRAVENOUS
  Filled 2019-05-24 (×5): qty 1

## 2019-05-24 MED ORDER — SODIUM CHLORIDE 0.9% FLUSH
10.0000 mL | Freq: Two times a day (BID) | INTRAVENOUS | Status: DC
  Administered 2019-05-24 – 2019-05-27 (×8): 10 mL
  Administered 2019-05-28: 30 mL

## 2019-05-24 MED ORDER — LORAZEPAM 2 MG/ML IJ SOLN
0.5000 mg | Freq: Four times a day (QID) | INTRAMUSCULAR | Status: DC
  Filled 2019-05-24: qty 1

## 2019-05-24 MED ORDER — SODIUM CHLORIDE 0.9% FLUSH: 10.0000 mL | INTRAVENOUS | Status: DC | PRN

## 2019-05-24 MED ORDER — IPRATROPIUM-ALBUTEROL 0.5-2.5 (3) MG/3ML IN SOLN: 3.0000 mL | Freq: Four times a day (QID) | RESPIRATORY_TRACT | Status: DC | PRN

## 2019-05-24 NOTE — Progress Notes (Signed)
Physical Therapy Evaluation Patient Details Name: Wyn Forsterva Nell Kinslow MRN: [id number] DOB: [birth year] Today's Date: 05/24/2019   History of Present Illness  [employer] Laury Deepell Wilhoite is an 85 y.o. female who was admitted to the hospital with medical diagnosis of acute respitory distress with hypoxia, fluid overload, and UTI after being brought to the ED on 05/23/2019 from [facility] assisted living by EMS due to 4 days decline in strength and mentation. She was hypoxic upon arrival and ECK showed R BBB. Chest x-ray showed vascular congestion and urinalisis showed infection. She is intermittantly confused and could not provide reliable history at initial eval. Relevant PMH includes dementia, neuropathy, DM, HTN, anxiety, vertigo, myocytic anemia, GERD, Hx of apsiration pneumonia, hip fracture, and back sx for tumor removal.    Clinical Impression  Patient oriented only to name and month/day she was born. She was very confused throughout session and was unable to provide a reliable history. She required repeated encouragement to participate in physical therapy due to confusion about the purpose, but was willing to cooperate and responsive to cuing when she felt there was reason for the activity. Patient received 2L/min O2 throughout treatment. Her SpO2 was 95% and HR 92 bpm at rest at start of session. She required mod A to complete supine to sit and was unable to initiate movement when requested, but was cooperative when provided assistance. Patient completed sit <> stand transfers to RW with CGA for safety and demo failure to push up or reach back with hands. She ambulated approximately 50 feet with RW and CGA for safety. She demo extremely slow gait, stopping frequently to question what she was supposed to do next and questioning rationale given by clinician. She was able to be encouraged to ambulate just outside the door before returning. Patient ambulated with minimal heel strike, stooped posture, and minimal stride  length. Patient's SpO2 was 88% upon return from ambulation, and PT noticed the nasal cannula had slipped from her nose. Patient did not appear to be aware of any breathing discomfort. Patient appears to have experienced a significant decline in functional independence and would benefit from short term rehab prior to returning to her home at the ALF to facilitate improved safety and functional ability. Expect she would be better able to participate in rehab when her cognition improves. Patient would benefit from ongoing physical therapy to address impairments and functional limitations (see PT Problem List below) to work towards stated goals and return to PLOF or maximal functional independence.      Follow Up Recommendations SNF;Supervision/Assistance - 24 hour    Equipment Recommendations  Rolling walker with 5" wheels;3in1 (PT)    Recommendations for Other Services       Precautions / Restrictions Precautions Precautions: Fall;Other (comment) Precaution Comments: intermittantly confused Restrictions Weight Bearing Restrictions: No      Mobility  Bed Mobility Overal bed mobility: Needs Assistance Bed Mobility: Supine to Sit     Supine to sit: Mod assist     General bed mobility comments: Patient with minimal attempts to initiate movement upon request. Co-operated with mod A using log roll technique. Declined to sit up in chair or lay down once sitting at edge of bed. Nursing present and took over supervision.  Transfers Overall transfer level: Needs assistance Equipment used: Rolling walker (2 wheeled) Transfers: Sit to/from Stand Sit to Stand: Min guard         General transfer comment: Patient initially declined to use RW stating "I don't need this" repeatedly but  then stood up from edge of bed with CGA for safety. Demo lack of smooth control with stand to sit and failed to reach back.  Ambulation/Gait Ambulation/Gait assistance: Min guard Gait Distance (Feet): 50  Feet Assistive device: Rolling walker (2 wheeled) Gait Pattern/deviations: Decreased stride length;Trunk flexed;Wide base of support Gait velocity: very slow   General Gait Details: Pt ambulated with extremely slow gait using RW and CGA for safety. Repeatedly stopped and asked what to do next, then required coaxing and encouragement to follow directions to continue. Noted for wide base of support, minimal heel strike, and short step length bilaterally. Pt with 2L/min O2 delivery throughout treatment, however cannula repeatedly moved away from nose. SpO2 was 95% while reclining in bed and dropped to 88% with ambulation (when cannula moved away from nose).  Stairs            Wheelchair Mobility    Modified Rankin (Stroke Patients Only)       Balance Overall balance assessment: Needs assistance Sitting-balance support: Feet unsupported;Bilateral upper extremity supported Sitting balance-Leahy Scale: Fair Sitting balance - Comments: able to sit at edge of bed steady with feet unsupported and hands on the bed.   Standing balance support: Bilateral upper extremity supported;During functional activity Standing balance-Leahy Scale: Poor Standing balance comment: Patient reliant on RW for balance during ambulation and standing activities.                             Pertinent Vitals/Pain Pain Assessment: Faces Faces Pain Scale: No hurt    Home Living Family/patient expects to be discharged to:: Assisted living([facility])                 Additional Comments: Documentation states she ambulates with walker at baseline.    Prior Function Level of Independence: Needs assistance         Comments: Patient was confused and unable to provide a reliable history. Documentation states she ambulates at assisted living facility with RW at baseline.     Hand Dominance        Extremity/Trunk Assessment   Upper Extremity Assessment Upper Extremity Assessment:  Generalized weakness    Lower Extremity Assessment Lower Extremity Assessment: Generalized weakness    Cervical / Trunk Assessment Cervical / Trunk Assessment: Kyphotic  Communication   Communication: Other (comment)(Disoriented)  Cognition Arousal/Alertness: Awake/alert Behavior During Therapy: Agitated Overall Cognitive Status: History of cognitive impairments - at baseline                                 General Comments: Patient able to state her full name and month and day she was born. Guessed she was at a hospital but unable to say which one or which city. Argumentative about mobilizing and questioning why she should get up or move around. Unable to state what she should do to get warm when she said she was cold. According to documentation, pt has dementia at baseline but was admitted with worsening confusion.      General Comments      Exercises Other Exercises Other Exercises: Educated extensively on purpose of PT and importance of mobilizing. Extensive education to encourage pt to participate.   Assessment/Plan    PT Assessment Patient needs continued PT services  PT Problem List Decreased strength;Decreased mobility;Decreased safety awareness;Decreased knowledge of precautions;Obesity;Decreased activity tolerance;Decreased cognition;Cardiopulmonary status limiting activity;Decreased balance;Decreased knowledge of use  of DME       PT Treatment Interventions DME instruction;Therapeutic activities;Cognitive remediation;Gait training;Therapeutic exercise;Patient/family education;Balance training;Functional mobility training;Neuromuscular re-education    PT Goals (Current goals can be found in the Care Plan section)  Acute Rehab PT Goals PT Goal Formulation: Patient unable to participate in goal setting Time For Goal Achievement: 06/07/19 Potential to Achieve Goals: Fair    Frequency Min 2X/week   Barriers to discharge Decreased caregiver  support Patient appears to require higher level of care than can be provided at ALF at this point    Co-evaluation               AM-PAC PT "6 Clicks" Mobility  Outcome Measure Help needed turning from your back to your side while in a flat bed without using bedrails?: A Lot Help needed moving from lying on your back to sitting on the side of a flat bed without using bedrails?: A Lot Help needed moving to and from a bed to a chair (including a wheelchair)?: A Little Help needed standing up from a chair using your arms (e.g., wheelchair or bedside chair)?: A Little Help needed to walk in hospital room?: A Little Help needed climbing 3-5 steps with a railing? : Total 6 Click Score: 14    End of Session Equipment Utilized During Treatment: Gait belt Activity Tolerance: Patient tolerated treatment well;Other (comment)(patient limited by cognitive impairment) Patient left: in bed;with nursing/sitter in room Nurse Communication: Mobility status PT Visit Diagnosis: Unsteadiness on feet (R26.81);Muscle weakness (generalized) (M62.81);Difficulty in walking, not elsewhere classified (R26.2)    Time: 1820-1840 PT Time Calculation (min) (ACUTE ONLY): 20 min   Charges:   PT Evaluation $PT Eval Moderate Complexity: 1 Mod         Sara R. Graylon Good, PT, DPT 05/24/19, 7:22 PM

## 2019-05-24 NOTE — Progress Notes (Signed)
MD notified: Patient intermittently confused. Refuses medications and insulin. I spoke to son this morning to check if the patient gets this confused at at [facility] as well and he indicated she does some times. I will try to give meds later again.

## 2019-05-24 NOTE — TOC Initial Note (Signed)
Transition of Care [facility]) - Initial/Assessment Note    Patient Details  Name: [name] MRN: [id number] Date of Birth: 03/27/1932  Transition of [facility]) CM/SW Contact:    Beverly Sessions, RN Phone Number: 05/24/2019, 2:48 PM  Clinical Narrative:                 Patient admitted from [facility] with Hypoxia and found to have a UTI  Message left with Thayer Headings from [facility] to confirm patient can return at discharge  RNCM spoke with son [name]. Per [name] patient has lived at [facility] for a year and a half. Ambulates with a RW.  Seen by Dr Ouida Sills at [facility].    Previously went to [facility] in October of 2019  Acute O2.  Son states  "normally when the infection is gone she doesn't need o2 any more"  PT eval pending   Expected Discharge Plan: (Assistned living with home health)     Patient Goals and CMS Choice        Expected Discharge Plan and Services Expected Discharge Plan: (Assistned living with home health)       Living arrangements for the past 2 months: [place] Expected Discharge Date: 05/25/19                                    Prior Living Arrangements/Services Living arrangements for the past 2 months: [place] Lives with:: Facility Resident Patient language and need for interpreter reviewed:: Yes Do you feel safe going back to the place where you live?: Yes      Need for Family Participation in Patient Care: Yes (Comment) Care giver support system in place?: Yes (comment) Current home services: DME Criminal Activity/Legal Involvement Pertinent to Current Situation/Hospitalization: No - Comment as needed  Activities of Daily Living Home Assistive Devices/Equipment: Gilford Rile (specify type) ADL Screening (condition at time of admission) Patient's cognitive ability adequate to safely complete daily activities?: Yes Is the patient deaf or have difficulty hearing?: No Does the patient have difficulty  seeing, even when wearing glasses/contacts?: No Does the patient have difficulty concentrating, remembering, or making decisions?: No Patient able to express need for assistance with ADLs?: Yes Does the patient have difficulty dressing or bathing?: Yes Independently performs ADLs?: Yes (appropriate for developmental age) Does the patient have difficulty walking or climbing stairs?: Yes Weakness of Legs: None Weakness of Arms/Hands: None  Permission Sought/Granted                  Emotional Assessment Appearance:: Appears stated age     Orientation: : Fluctuating Orientation (Suspected and/or reported Sundowners) Alcohol / Substance Use: Not Applicable Psych Involvement: No (comment)  Admission diagnosis:  Hypoxia [R09.02] Acute cystitis without hematuria [N30.00] Patient Active Problem List   Diagnosis Date Noted  . Hypoxia 05/23/2019  . Acute delirium 08/16/2018  . Dementia with behavioral disturbance ([place]) 08/16/2018  . Encephalopathy acute 08/11/2018  . Sepsis ([place]) 05/21/2017  . Aspiration pneumonia ([place]) 05/21/2017  . GERD (gastroesophageal reflux disease) 05/21/2017  . HLD (hyperlipidemia) 05/21/2017  . Depression 05/21/2017  . Anxiety 05/21/2017  . Elevated troponin 05/21/2017  . Diabetes ([place]) 05/21/2017  . Right humeral fracture 05/21/2017  . Nausea and vomiting 03/31/2015  . Microcytic anemia 03/31/2015   PCP:  Kirk Ruths, MD Pharmacy:   [facility], [address] [place]  [id number] Phone: [phone] Fax: [phone]     Social Determinants of Health (SDOH) Interventions    Readmission Risk Interventions Readmission Risk Prevention Plan 05/24/2019  Transportation Screening Complete  Medication Review Oceanographer(RN Care Manager) Complete  Some recent data might be hidden

## 2019-05-24 NOTE — Plan of Care (Signed)
The patient is alert and oriented x2. The patient has episodes of intermittent confusion. The patient has been complaining of nausea yet has been refusing medications. The unit manager and the nurse spoke with the patient's son this morning to update him and have him speak to his mother and convince her to take her medications. The patient was been assisted multiple time from the bed into the chair.

## 2019-05-24 NOTE — Progress Notes (Signed)
[employer] - [place] at [facility]                                                                                                                                                                                  Patient Demographics   [name], is a 85 y.o. female, DOB - 10/24/1932, [id number]  Admit date - 05/23/2019   Admitting Physician Ihor AustinPavan Pyreddy, MD  Outpatient Primary MD for the patient is [name], Marshall W, MD   LOS - 1  Subjective: Patient was agitated earlier refused to wear her oxygen Unable to provide any review of system   Review of Systems:   CONSTITUTIONAL: Unable to provide.    Vitals:   Vitals:   05/24/19 0423 05/24/19 1028 05/24/19 1029 05/24/19 1144  BP: (!) 143/50   (!) 154/54  Pulse: 86   67  Resp: 20   16  Temp: 98.8 F (37.1 C)   98.8 F (37.1 C)  TempSrc: Oral   Oral  SpO2: 97% 92% 91% 100%  Weight:      Height:        Wt Readings from Last 3 Encounters:  05/23/19 78.5 kg  08/13/18 84.3 kg  06/14/18 83 kg     Intake/Output Summary (Last 24 hours) at 05/24/2019 1515 Last data filed at 05/24/2019 0423 Gross per 24 hour  Intake 240 ml  Output 675 ml  Net -435 ml    Physical Exam:   GENERAL: Pleasant-appearing in no apparent distress.  HEAD, EYES, EARS, NOSE AND THROAT: Atraumatic, normocephalic. Extraocular muscles are intact. Pupils equal and reactive to light. Sclerae anicteric. No conjunctival injection. No oro-pharyngeal erythema.  NECK: Supple. There is no jugular venous distention. No bruits, no lymphadenopathy, no thyromegaly.  HEART: Regular rate and rhythm,. No murmurs, no rubs, no clicks.  LUNGS: Clear to auscultation bilaterally. No rales or rhonchi. No wheezes.  ABDOMEN: Soft, flat, nontender, nondistended. Has good bowel sounds. No hepatosplenomegaly appreciated.  EXTREMITIES: No evidence of any cyanosis, clubbing, or peripheral edema.  +2 pedal and radial pulses bilaterally.  NEUROLOGIC: The  patient is alert, not oriented  sKIN: Moist and warm with no rashes appreciated.  Psych: Not anxious, depressed LN: No inguinal LN enlargement    Antibiotics   Anti-infectives (From admission, onward)   Start     Dose/Rate Route Frequency Ordered Stop   05/24/19 1800  cefTRIAXone (ROCEPHIN) 1 g in sodium chloride 0.9 % 100 mL IVPB     1 g 200 mL/hr over 30 Minutes Intravenous Every 24 hours 05/23/19 1518     05/23/19 1445  cefTRIAXone (ROCEPHIN) 1 g in sodium chloride 0.9 % 100 mL IVPB  1 g 200 mL/hr over 30 Minutes Intravenous  Once 05/23/19 1444 05/23/19 1716      Medications   Scheduled Meds: . amLODipine  5 mg Oral Daily  . aspirin EC  81 mg Oral Daily  . dicyclomine  20 mg Oral QID  . divalproex  250 mg Oral QHS  . enoxaparin (LOVENOX) injection  40 mg Subcutaneous Q24H  . fluticasone  1 spray Each Nare Daily  . insulin aspart  0-15 Units Subcutaneous TID WC  . insulin aspart  0-5 Units Subcutaneous QHS  . ipratropium-albuterol  3 mL Nebulization BID  . irbesartan  75 mg Oral Daily  . loratadine  10 mg Oral Daily  . metFORMIN  500 mg Oral BID WC  . metoprolol tartrate  25 mg Oral BID  . multivitamin with minerals  1 tablet Oral Daily  . olopatadine  1 drop Both Eyes Daily  . pantoprazole  40 mg Oral Daily  . QUEtiapine  12.5 mg Oral Daily  . sodium chloride flush  10-40 mL Intracatheter Q12H  . sodium chloride flush  3 mL Intravenous Q12H   Continuous Infusions: . sodium chloride    . cefTRIAXone (ROCEPHIN)  IV     PRN Meds:.sodium chloride, acetaminophen **OR** acetaminophen, alum & mag hydroxide-simeth, loperamide, LORazepam, magnesium hydroxide, ondansetron **OR** ondansetron (ZOFRAN) IV, sodium chloride flush, sodium chloride flush   Data Review:   Micro Results Recent Results (from the past 240 hour(s))  Blood culture (routine x 2)     Status: None (Preliminary result)   Collection Time: 05/23/19 10:25 AM   Specimen: BLOOD  Result Value Ref Range  Status   Specimen Description BLOOD RIGHT ANTECUBITAL  Final   Special Requests   Final    BOTTLES DRAWN AEROBIC AND ANAEROBIC Blood Culture adequate volume   Culture   Final    NO GROWTH < 24 HOURS Performed at [facility], [address] [place], [place] [address]    Report Status PENDING  Incomplete  SARS Coronavirus 2 (CEPHEID- Performed in [facility] hospital lab), Hosp Order     Status: None   Collection Time: 05/23/19 10:26 AM   Specimen: Nasopharyngeal Swab  Result Value Ref Range Status   SARS Coronavirus 2 NEGATIVE NEGATIVE Final    Comment: (NOTE) If result is NEGATIVE SARS-CoV-2 target nucleic acids are NOT DETECTED. The SARS-CoV-2 RNA is generally detectable in upper and lower  respiratory specimens during the acute phase of infection. The lowest  concentration of SARS-CoV-2 viral copies this assay can detect is 250  copies / mL. A negative result does not preclude SARS-CoV-2 infection  and should not be used as the sole basis for treatment or other  patient management decisions.  A negative result may occur with  improper specimen collection / handling, submission of specimen other  than nasopharyngeal swab, presence of viral mutation(s) within the  areas targeted by this assay, and inadequate number of viral copies  (<250 copies / mL). A negative result must be combined with clinical  observations, patient history, and epidemiological information. If result is POSITIVE SARS-CoV-2 target nucleic acids are DETECTED. The SARS-CoV-2 RNA is generally detectable in upper and lower  respiratory specimens dur ing the acute phase of infection.  Positive  results are indicative of active infection with SARS-CoV-2.  Clinical  correlation with patient history and other diagnostic information is  necessary to determine patient infection status.  Positive results do  not rule out bacterial infection or co-infection with  other viruses. If result is PRESUMPTIVE  POSTIVE SARS-CoV-2 nucleic acids MAY BE PRESENT.   A presumptive positive result was obtained on the submitted specimen  and confirmed on repeat testing.  While 2019 novel coronavirus  (SARS-CoV-2) nucleic acids may be present in the submitted sample  additional confirmatory testing may be necessary for epidemiological  and / or clinical management purposes  to differentiate between  SARS-CoV-2 and other Sarbecovirus currently known to infect humans.  If clinically indicated additional testing with an alternate test  methodology [phone]) is advised. The SARS-CoV-2 RNA is generally  detectable in upper and lower respiratory sp ecimens during the acute  phase of infection. The expected result is Negative. Fact Sheet for Patients:  StrictlyIdeas.no Fact Sheet for Healthcare Providers: BankingDealers.co.za This test is not yet approved or cleared by the [place] FDA and has been authorized for detection and/or diagnosis of SARS-CoV-2 by FDA under an Emergency Use Authorization (EUA).  This EUA will remain in effect (meaning this test can be used) for the duration of the COVID-19 declaration under Section 564(b)(1) of the Act, 21 U.S.C. section 360bbb-3(b)(1), unless the authorization is terminated or revoked sooner. Performed at [facility], [address] [place], [place] [address]   Blood culture (routine x 2)     Status: None (Preliminary result)   Collection Time: 05/23/19 10:30 AM   Specimen: BLOOD  Result Value Ref Range Status   Specimen Description BLOOD LEFT ANTECUBITAL  Final   Special Requests   Final    BOTTLES DRAWN AEROBIC AND ANAEROBIC Blood Culture adequate volume   Culture   Final    NO GROWTH < 24 HOURS Performed at [facility], [address], [place], [place] [address]    Report Status PENDING  Incomplete    Radiology Reports Dg Chest Port 1 View  Result Date: 05/23/2019 CLINICAL  DATA:  Short of breath EXAM: PORTABLE CHEST 1 VIEW COMPARISON:  08/20/2018 FINDINGS: Cardiac enlargement with pulmonary vascular congestion. Overall improved aeration compared to the prior study. No pleural effusion. Surgical clips right paratracheal region. IMPRESSION: Cardiac enlargement with mild vascular congestion. Negative for edema or pneumonia. Electronically Signed   By: Franchot Gallo M.D.   On: 05/23/2019 11:11     CBC Recent Labs  Lab 05/23/19 1036 05/24/19 0517  WBC 9.1 7.9  HGB 11.0* 11.0*  HCT 36.8 37.0  PLT 214 200  MCV 85.4 87.3  MCH 25.5* 25.9*  MCHC 29.9* 29.7*  RDW 14.3 14.2  LYMPHSABS 1.3  --   MONOABS 0.8  --   EOSABS 0.2  --   BASOSABS 0.0  --     Chemistries  Recent Labs  Lab 05/23/19 1036 05/24/19 0517  NA 142 143  K 4.4 4.5  CL 109 109  CO2 27 27  GLUCOSE 134* 141*  BUN 17 18  CREATININE 0.69 0.66  CALCIUM 9.2 9.1  MG 1.6*  --   AST 14*  --   ALT 19  --   ALKPHOS 93  --   BILITOT 0.5  --    ------------------------------------------------------------------------------------------------------------------ estimated creatinine clearance is 54.2 mL/min (by C-G formula based on SCr of 0.66 mg/dL). ------------------------------------------------------------------------------------------------------------------ Recent Labs    05/23/19 1716  HGBA1C 6.7*   ------------------------------------------------------------------------------------------------------------------ No results for input(s): CHOL, HDL, LDLCALC, TRIG, CHOLHDL, LDLDIRECT in the last 72 hours. ------------------------------------------------------------------------------------------------------------------ No results for input(s): TSH, T4TOTAL, T3FREE, THYROIDAB in the last 72 hours.  Invalid input(s): FREET3 ------------------------------------------------------------------------------------------------------------------ No results for input(s): VITAMINB12, FOLATE,  FERRITIN,  TIBC, IRON, RETICCTPCT in the last 72 hours.  Coagulation profile No results for input(s): INR, PROTIME in the last 168 hours.  No results for input(s): DDIMER in the last 72 hours.  Cardiac Enzymes No results for input(s): CKMB, TROPONINI, MYOGLOBIN in the last 168 hours.  Invalid input(s): CK ------------------------------------------------------------------------------------------------------------------ Invalid input(s): POCBNP    Assessment & Plan   IMPRESSION AND PLAN: 85 year old female patient with a known history of type 2 diabetes mellitus, GERD, hyperlipidemia, neuropathy, anxiety disorder presented to the emergency room for difficulty breathing.    -Acute respiratory distress with hypoxia This is due to fluid overload continue IV fluid Lasix Wean oxygen as tolerated  -Fluid overload Echocardiogram of the heart pending  -Urinary tract infection IV Rocephin antibiotic daily Urine cultures pending  -Type 2 diabetes mellitus Diabetic diet with sliding scale coverage with insulin  -Hypertension Continue beta-blocker  -DVT prophylaxis subcu Lovenox daily     Code Status Orders  (From admission, onward)         Start     Ordered   05/23/19 1926  Do not attempt resuscitation (DNR)  Continuous    Question Answer Comment  In the event of cardiac or respiratory ARREST Do not call a "code blue"   In the event of cardiac or respiratory ARREST Do not perform Intubation, CPR, defibrillation or ACLS   In the event of cardiac or respiratory ARREST Use medication by any route, position, wound care, and other measures to relive pain and suffering. May use oxygen, suction and manual treatment of airway obstruction as needed for comfort.   Comments Nurse may pronounce      05/23/19 1925        Code Status History    Date Active Date Inactive Code Status Order ID Comments User Context   08/11/2018 2201 08/24/2018 2045 DNR [id number]  Bertrum SolSalary, Montell D, MD  Inpatient   05/21/2017 2338 05/24/2017 2208 DNR [id number]  Oralia ManisWillis, David, MD Inpatient   Advance Care Planning Activity           Consults none  DVT Prophylaxis  Lovenox Lab Results  Component Value Date   PLT 200 05/24/2019     Time Spent in minutes   35 minutes  Greater than 50% of time spent in care coordination and counseling patient regarding the condition and plan of care.   Auburn BilberryShreyang Kjell Brannen M.D on 05/24/2019 at 3:15 PM  Between 7am to 6pm - Pager - [phone]  After 6pm go to [email or web address] - Social research officer, [email or web address] [email or web address]  [employer]   Office  [phone]

## 2019-05-24 NOTE — NC FL2 (Signed)
[place] LEVEL OF CARE SCREENING TOOL     IDENTIFICATION  Patient Name: [name] Birthdate: [birth year] Sex: female Admission Date (Current Location): 05/23/2019  [facility] and [place] Number:  Engineering geologist and Address:         Provider Number: [id number]  Attending Physician Name and Address:  Dustin Flock, MD  Relative Name and Phone Number:       Current Level of Care: Hospital Recommended Level of Care: [place] Prior Approval Number:    Date Approved/Denied:   PASRR Number:    Discharge Plan: Other (Comment)    Current Diagnoses: Patient Active Problem List   Diagnosis Date Noted  . Hypoxia 05/23/2019  . Acute delirium 08/16/2018  . Dementia with behavioral disturbance ([place]) 08/16/2018  . Encephalopathy acute 08/11/2018  . Sepsis ([place]) 05/21/2017  . Aspiration pneumonia ([place]) 05/21/2017  . GERD (gastroesophageal reflux disease) 05/21/2017  . HLD (hyperlipidemia) 05/21/2017  . Depression 05/21/2017  . Anxiety 05/21/2017  . Elevated troponin 05/21/2017  . Diabetes ([place]) 05/21/2017  . Right humeral fracture 05/21/2017  . Nausea and vomiting 03/31/2015  . Microcytic anemia 03/31/2015    Orientation RESPIRATION BLADDER Height & Weight     Self    Incontinent Weight: 78.5 kg Height:  5\' 5"  (165.1 cm)  BEHAVIORAL SYMPTOMS/MOOD NEUROLOGICAL BOWEL NUTRITION STATUS      Continent Diet(Carb modifed)  AMBULATORY STATUS COMMUNICATION OF NEEDS Skin   Limited Assist Verbally Normal                       Personal Care Assistance Level of Assistance  Dressing, Bathing Bathing Assistance: Limited assistance   Dressing Assistance: Limited assistance     Functional Limitations Info             SPECIAL CARE FACTORS FREQUENCY                       Contractures Contractures Info: Not present    Additional Factors Info  Code Status, Allergies Code Status Info: DNR Allergies Info: Amplcillin,  sulfa Antibiotics           Current Medications (05/24/2019):  This is the current hospital active medication list Current Facility-Administered Medications  Medication Dose Route Frequency Provider Last Rate Last Dose  . 0.9 %  sodium chloride infusion  250 mL Intravenous PRN Pyreddy, Reatha Harps, MD      . acetaminophen (TYLENOL) tablet 650 mg  650 mg Oral Q6H PRN Saundra Shelling, MD   650 mg at 05/23/19 2120   Or  . acetaminophen (TYLENOL) suppository 650 mg  650 mg Rectal Q6H PRN Saundra Shelling, MD      . alum & mag hydroxide-simeth (MAALOX/MYLANTA) 200-200-20 MG/5ML suspension 30 mL  30 mL Oral PRN Pyreddy, Pavan, MD      . amLODipine (NORVASC) tablet 5 mg  5 mg Oral Daily Pyreddy, Reatha Harps, MD   5 mg at 05/24/19 0952  . aspirin EC tablet 81 mg  81 mg Oral Daily Pyreddy, Reatha Harps, MD   81 mg at 05/24/19 1412  . cefTRIAXone (ROCEPHIN) 1 g in sodium chloride 0.9 % 100 mL IVPB  1 g Intravenous Q24H Pyreddy, Pavan, MD      . dicyclomine (BENTYL) tablet 20 mg  20 mg Oral QID Saundra Shelling, MD   20 mg at 05/24/19 1412  . divalproex (DEPAKOTE ER) 24 hr tablet 250 mg  250 mg Oral QHS Saundra Shelling, MD  250 mg at 05/23/19 2109  . enoxaparin (LOVENOX) injection 40 mg  40 mg Subcutaneous Q24H Ihor AustinPyreddy, Pavan, MD   40 mg at 05/23/19 2109  . fluticasone (FLONASE) 50 MCG/ACT nasal spray 1 spray  1 spray Each Nare Daily Pyreddy, Pavan, MD      . insulin aspart (novoLOG) injection 0-15 Units  0-15 Units Subcutaneous TID WC Ihor AustinPyreddy, Pavan, MD   2 Units at 05/24/19 0959  . insulin aspart (novoLOG) injection 0-5 Units  0-5 Units Subcutaneous QHS Pyreddy, Pavan, MD      . ipratropium-albuterol (DUONEB) 0.5-2.5 (3) MG/3ML nebulizer solution 3 mL  3 mL Nebulization BID Pyreddy, Pavan, MD      . irbesartan (AVAPRO) tablet 75 mg  75 mg Oral Daily Pyreddy, Vivien RotaPavan, MD   75 mg at 05/24/19 0952  . loperamide (IMODIUM) capsule 2 mg  2 mg Oral Daily PRN Pyreddy, Vivien RotaPavan, MD      . loratadine (CLARITIN) tablet 10 mg  10 mg Oral  Daily Pyreddy, Vivien RotaPavan, MD   10 mg at 05/24/19 0952  . LORazepam (ATIVAN) tablet 0.5 mg  0.5 mg Oral Q6H PRN Pyreddy, Vivien RotaPavan, MD      . magnesium hydroxide (MILK OF MAGNESIA) suspension 30 mL  30 mL Oral Daily PRN Pyreddy, Vivien RotaPavan, MD      . metFORMIN (GLUCOPHAGE-XR) 24 hr tablet 500 mg  500 mg Oral BID WC Pyreddy, Vivien RotaPavan, MD   500 mg at 05/24/19 0951  . metoprolol tartrate (LOPRESSOR) tablet 25 mg  25 mg Oral BID Ihor AustinPyreddy, Pavan, MD   25 mg at 05/24/19 0951  . multivitamin with minerals tablet 1 tablet  1 tablet Oral Daily Ihor AustinPyreddy, Pavan, MD   1 tablet at 05/24/19 0951  . olopatadine (PATANOL) 0.1 % ophthalmic solution 1 drop  1 drop Both Eyes Daily Pyreddy, Pavan, MD   1 drop at 05/24/19 1001  . ondansetron (ZOFRAN) tablet 4 mg  4 mg Oral Q6H PRN Ihor AustinPyreddy, Pavan, MD   4 mg at 05/24/19 1412   Or  . ondansetron (ZOFRAN) injection 4 mg  4 mg Intravenous Q6H PRN Pyreddy, Vivien RotaPavan, MD      . pantoprazole (PROTONIX) EC tablet 40 mg  40 mg Oral Daily Pyreddy, Vivien RotaPavan, MD   40 mg at 05/24/19 0952  . QUEtiapine (SEROQUEL) tablet 12.5 mg  12.5 mg Oral Daily Pyreddy, Vivien RotaPavan, MD   12.5 mg at 05/24/19 0952  . sodium chloride flush (NS) 0.9 % injection 10-40 mL  10-40 mL Intracatheter Q12H [place] BilberryPatel, Shreyang, MD   10 mL at 05/24/19 1012  . sodium chloride flush (NS) 0.9 % injection 10-40 mL  10-40 mL Intracatheter PRN [place] BilberryPatel, Shreyang, MD      . sodium chloride flush (NS) 0.9 % injection 3 mL  3 mL Intravenous Q12H Pyreddy, Pavan, MD   3 mL at 05/24/19 1013  . sodium chloride flush (NS) 0.9 % injection 3 mL  3 mL Intravenous PRN Ihor AustinPyreddy, Pavan, MD         Discharge Medications: Please see discharge summary for a list of discharge medications.  Relevant Imaging Results:  Relevant Lab Results:   Additional Information    Adair Lauderback, Julio AlmSTEPHANIE T, RN

## 2019-05-25 LAB — GLUCOSE, CAPILLARY
Glucose-Capillary: 130 mg/dL — ABNORMAL HIGH (ref 70–99)
Glucose-Capillary: 150 mg/dL — ABNORMAL HIGH (ref 70–99)
Glucose-Capillary: 101 mg/dL — ABNORMAL HIGH (ref 70–99)
Glucose-Capillary: 144 mg/dL — ABNORMAL HIGH (ref 70–99)

## 2019-05-25 MED ORDER — CEFDINIR 300 MG PO CAPS
300.0000 mg | ORAL_CAPSULE | Freq: Two times a day (BID) | ORAL | Status: DC
  Administered 2019-05-25 – 2019-05-28 (×7): 300 mg via ORAL
  Filled 2019-05-25 (×7): qty 1

## 2019-05-25 MED ORDER — FUROSEMIDE 10 MG/ML IJ SOLN
40.0000 mg | Freq: Two times a day (BID) | INTRAMUSCULAR | Status: DC
  Administered 2019-05-25 – 2019-05-27 (×6): 40 mg via INTRAVENOUS
  Filled 2019-05-25 (×7): qty 4

## 2019-05-25 MED ORDER — POLYETHYLENE GLYCOL 3350 17 G PO PACK: 17.0000 g | PACK | Freq: Every day | ORAL | Status: DC | PRN

## 2019-05-25 NOTE — TOC Progression Note (Signed)
Transition of Care [facility]) - Progression Note    Patient Details  Name: Huntleigh Doolen MRN: [id number] Date of Birth: Mar 15, 1932  [facility]) CM/SW Contact  Beverly Sessions, RN Phone Number: 05/25/2019, 3:17 PM  Clinical Narrative:     PT has assessed patient and recommending SNF level of care.  RNCM has spoken with Tammy at [facility] [phone]) and confirmed patient can return.  Their preference for home health is [facility] home [facility].  Since son does not have a preference of agency heads up referral made to [facility] with [facility].   Message left for son to confirm he is ok with patient returning to [facility] with SNF recommendation  Patient has been weaned to RA Recommendation for RW and BSC.  Patient already had RW, need to confirm with son if she has a [facility]  Expected Discharge Plan: Assisted Living Barriers to Discharge: Barriers Resolved  Expected Discharge Plan and Services Expected Discharge Plan: Assisted Living   Discharge Planning Services: CM Consult Post Acute Care Choice: [place] arrangements for the past 2 months: [place] Expected Discharge Date: 05/25/19                         HH Arranged: RN, PT [place] Agency: [place]: [phone] Representative spoke with at [place]: [place] Interventions    Readmission Risk Interventions Readmission Risk Prevention Plan 05/24/2019  Transportation Screening Complete  Medication Review Press photographer) Complete  Some recent data might be hidden

## 2019-05-25 NOTE — Progress Notes (Signed)
[name] [facility]                                                                                                                                                                                  Patient Demographics   [name], is a 85 y.o. female, DOB - 1932/07/28, SWF:[id number]  Admit date - 05/23/2019   Admitting Physician Saundra Shelling, MD  Outpatient Primary MD for the patient is Kirk Ruths, MD   LOS - 2  Subjective: Patient is doing better has some dementia and is confused  Review of Systems:   CONSTITUTIONAL: Unable to provide.  Due to dementia   Vitals:   Vitals:   05/24/19 1900 05/24/19 2045 05/25/19 0316 05/25/19 1219  BP:  (!) 132/47 (!) 127/56 (!) 116/39  Pulse:  79 80 75  Resp:  20 (!) 24   Temp:  98.7 F (37.1 C) 98.8 F (37.1 C) 98 F (36.7 C)  TempSrc:  Oral Oral   SpO2: 91% 91% 95% 90%  Weight:      Height:        Wt Readings from Last 3 Encounters:  05/23/19 78.5 kg  08/13/18 84.3 kg  06/14/18 83 kg     Intake/Output Summary (Last 24 hours) at 05/25/2019 1221 Last data filed at 05/25/2019 0900 Gross per 24 hour  Intake 459.96 ml  Output 900 ml  Net -440.04 ml    Physical Exam:   GENERAL: Pleasant-appearing in no apparent distress.  HEAD, EYES, EARS, NOSE AND THROAT: Atraumatic, normocephalic. Extraocular muscles are intact. Pupils equal and reactive to light. Sclerae anicteric. No conjunctival injection. No oro-pharyngeal erythema.  NECK: Supple. There is no jugular venous distention. No bruits, no lymphadenopathy, no thyromegaly.  HEART: Regular rate and rhythm,. No murmurs, no rubs, no clicks.  LUNGS: Clear to auscultation bilaterally. No rales or rhonchi. No wheezes.  ABDOMEN: Soft, flat, nontender, nondistended. Has good bowel sounds. No hepatosplenomegaly appreciated.  EXTREMITIES: No evidence of any cyanosis, clubbing, or peripheral edema.  +2 pedal and radial pulses bilaterally.   NEUROLOGIC: The patient is alert, not oriented  sKIN: Moist and warm with no rashes appreciated.  Psych: Not anxious, depressed LN: No inguinal LN enlargement    Antibiotics   Anti-infectives (From admission, onward)   Start     Dose/Rate Route Frequency Ordered Stop   05/24/19 1800  cefTRIAXone (ROCEPHIN) 1 g in sodium chloride 0.9 % 100 mL IVPB     1 g 200 mL/hr over 30 Minutes Intravenous Every 24 hours 05/23/19 1518     05/23/19 1445  cefTRIAXone (ROCEPHIN) 1 g in sodium chloride 0.9 % 100 mL IVPB  1 g 200 mL/hr over 30 Minutes Intravenous  Once 05/23/19 1444 05/23/19 1716      Medications   Scheduled Meds: . amLODipine  5 mg Oral Daily  . aspirin EC  81 mg Oral Daily  . dicyclomine  20 mg Oral QID  . divalproex  250 mg Oral QHS  . enoxaparin (LOVENOX) injection  40 mg Subcutaneous Q24H  . fluticasone  1 spray Each Nare Daily  . furosemide  40 mg Intravenous Q12H  . insulin aspart  0-15 Units Subcutaneous TID WC  . insulin aspart  0-5 Units Subcutaneous QHS  . irbesartan  75 mg Oral Daily  . loratadine  10 mg Oral Daily  . metFORMIN  500 mg Oral BID WC  . metoprolol tartrate  25 mg Oral BID  . multivitamin with minerals  1 tablet Oral Daily  . olopatadine  1 drop Both Eyes Daily  . pantoprazole  40 mg Oral Daily  . QUEtiapine  12.5 mg Oral Daily  . sodium chloride flush  10-40 mL Intracatheter Q12H  . sodium chloride flush  3 mL Intravenous Q12H   Continuous Infusions: . sodium chloride Stopped (05/24/19 2331)  . cefTRIAXone (ROCEPHIN)  IV Stopped (05/24/19 1930)   PRN Meds:.sodium chloride, acetaminophen **OR** acetaminophen, alum & mag hydroxide-simeth, ipratropium-albuterol, loperamide, LORazepam, magnesium hydroxide, ondansetron **OR** ondansetron (ZOFRAN) IV, sodium chloride flush, sodium chloride flush   Data Review:   Micro Results Recent Results (from the past 240 hour(s))  Blood culture (routine x 2)     Status: None (Preliminary result)    Collection Time: 05/23/19 10:25 AM   Specimen: BLOOD  Result Value Ref Range Status   Specimen Description BLOOD RIGHT ANTECUBITAL  Final   Special Requests   Final    BOTTLES DRAWN AEROBIC AND ANAEROBIC Blood Culture adequate volume   Culture   Final    NO GROWTH 2 DAYS Performed at [facility], [address] [place], [place] [address]    Report Status PENDING  Incomplete  SARS Coronavirus 2 (CEPHEID- Performed in [facility]), Hosp Order     Status: None   Collection Time: 05/23/19 10:26 AM   Specimen: Nasopharyngeal Swab  Result Value Ref Range Status   SARS Coronavirus 2 NEGATIVE NEGATIVE Final    Comment: (NOTE) If result is NEGATIVE SARS-CoV-2 target nucleic acids are NOT DETECTED. The SARS-CoV-2 RNA is generally detectable in upper and lower  respiratory specimens during the acute phase of infection. The lowest  concentration of SARS-CoV-2 viral copies this assay can detect is 250  copies / mL. A negative result does not preclude SARS-CoV-2 infection  and should not be used as the sole basis for treatment or other  patient management decisions.  A negative result may occur with  improper specimen collection / handling, submission of specimen other  than nasopharyngeal swab, presence of viral mutation(s) within the  areas targeted by this assay, and inadequate number of viral copies  (<250 copies / mL). A negative result must be combined with clinical  observations, patient history, and epidemiological information. If result is POSITIVE SARS-CoV-2 target nucleic acids are DETECTED. The SARS-CoV-2 RNA is generally detectable in upper and lower  respiratory specimens dur ing the acute phase of infection.  Positive  results are indicative of active infection with SARS-CoV-2.  Clinical  correlation with patient history and other diagnostic information is  necessary to determine patient infection status.  Positive results do  not rule out bacterial  infection  or co-infection with other viruses. If result is PRESUMPTIVE POSTIVE SARS-CoV-2 nucleic acids MAY BE PRESENT.   A presumptive positive result was obtained on the submitted specimen  and confirmed on repeat testing.  While 2019 novel coronavirus  (SARS-CoV-2) nucleic acids may be present in the submitted sample  additional confirmatory testing may be necessary for epidemiological  and / or clinical management purposes  to differentiate between  SARS-CoV-2 and other Sarbecovirus currently known to infect humans.  If clinically indicated additional testing with an alternate test  methodology [phone]([id number]) is advised. The SARS-CoV-2 RNA is generally  detectable in upper and lower respiratory sp ecimens during the acute  phase of infection. The expected result is Negative. Fact Sheet for Patients:  BoilerBrush.com.cyhttps://www.fda.gov/media/136312/download Fact Sheet for Healthcare Providers: https://pope.com/https://www.fda.gov/media/136313/download This test is not yet approved or cleared by the [place] FDA and has been authorized for detection and/or diagnosis of SARS-CoV-2 by FDA under an Emergency Use Authorization (EUA).  This EUA will remain in effect (meaning this test can be used) for the duration of the COVID-19 declaration under Section 564(b)(1) of the Act, 21 U.S.C. section 360bbb-3(b)(1), unless the authorization is terminated or revoked sooner. Performed at [facility], [address] [place], [place] [address]   Blood culture (routine x 2)     Status: None (Preliminary result)   Collection Time: 05/23/19 10:30 AM   Specimen: BLOOD  Result Value Ref Range Status   Specimen Description BLOOD LEFT ANTECUBITAL  Final   Special Requests   Final    BOTTLES DRAWN AEROBIC AND ANAEROBIC Blood Culture adequate volume   Culture   Final    NO GROWTH 2 DAYS Performed at [facility], [address] [place], [place] [address]    Report Status PENDING  Incomplete    Radiology  Reports Dg Chest Port 1 View  Result Date: 05/23/2019 CLINICAL DATA:  Short of breath EXAM: PORTABLE CHEST 1 VIEW COMPARISON:  08/20/2018 FINDINGS: Cardiac enlargement with pulmonary vascular congestion. Overall improved aeration compared to the prior study. No pleural effusion. Surgical clips right paratracheal region. IMPRESSION: Cardiac enlargement with mild vascular congestion. Negative for edema or pneumonia. Electronically Signed   By: Marlan Palauharles  Clark M.D.   On: 05/23/2019 11:11     CBC Recent Labs  Lab 05/23/19 1036 05/24/19 0517  WBC 9.1 7.9  HGB 11.0* 11.0*  HCT 36.8 37.0  PLT 214 200  MCV 85.4 87.3  MCH 25.5* 25.9*  MCHC 29.9* 29.7*  RDW 14.3 14.2  LYMPHSABS 1.3  --   MONOABS 0.8  --   EOSABS 0.2  --   BASOSABS 0.0  --     Chemistries  Recent Labs  Lab 05/23/19 1036 05/24/19 0517  NA 142 143  K 4.4 4.5  CL 109 109  CO2 27 27  GLUCOSE 134* 141*  BUN 17 18  CREATININE 0.69 0.66  CALCIUM 9.2 9.1  MG 1.6*  --   AST 14*  --   ALT 19  --   ALKPHOS 93  --   BILITOT 0.5  --    ------------------------------------------------------------------------------------------------------------------ estimated creatinine clearance is 54.2 mL/min (by C-G formula based on SCr of 0.66 mg/dL). ------------------------------------------------------------------------------------------------------------------ Recent Labs    05/23/19 1716  HGBA1C 6.7*   ------------------------------------------------------------------------------------------------------------------ No results for input(s): CHOL, HDL, LDLCALC, TRIG, CHOLHDL, LDLDIRECT in the last 72 hours. ------------------------------------------------------------------------------------------------------------------ No results for input(s): TSH, T4TOTAL, T3FREE, THYROIDAB in the last 72 hours.  Invalid input(s):  FREET3 ------------------------------------------------------------------------------------------------------------------ No results for input(s):  VITAMINB12, FOLATE, FERRITIN, TIBC, IRON, RETICCTPCT in the last 72 hours.  Coagulation profile No results for input(s): INR, PROTIME in the last 168 hours.  No results for input(s): DDIMER in the last 72 hours.  Cardiac Enzymes No results for input(s): CKMB, TROPONINI, MYOGLOBIN in the last 168 hours.  Invalid input(s): CK ------------------------------------------------------------------------------------------------------------------ Invalid input(s): POCBNP    Assessment & Plan   IMPRESSION AND PLAN: 85 year old female patient with a known history of type 2 diabetes mellitus, GERD, hyperlipidemia, neuropathy, anxiety disorder presented to the emergency room for difficulty breathing.    -Acute respiratory distress with hypoxia this is due to acute diastolic CHF Continue IV Lasix Echocardiogram shows elevated right inferior vena cava pressure   -Fluid overload Diastolic dysfunction treatment as above  -Urinary tract infection IV Rocephin antibiotic daily Urine are not back unclear if they were collected change to oral antibiotics tomorrow  -Type 2 diabetes mellitus-overall stable Diabetic diet with sliding scale coverage with insulin  -Hypertension Continue beta-blocker  -DVT prophylaxis subcu Lovenox daily     Code Status Orders  (From admission, onward)         Start     Ordered   05/23/19 1926  Do not attempt resuscitation (DNR)  Continuous    Question Answer Comment  In the event of cardiac or respiratory ARREST Do not call a "code blue"   In the event of cardiac or respiratory ARREST Do not perform Intubation, CPR, defibrillation or ACLS   In the event of cardiac or respiratory ARREST Use medication by any route, position, wound care, and other measures to relive pain and suffering. May use oxygen, suction  and manual treatment of airway obstruction as needed for comfort.   Comments Nurse may pronounce      05/23/19 1925        Code Status History    Date Active Date Inactive Code Status Order ID Comments User Context   08/11/2018 2201 08/24/2018 2045 DNR [id number]  Bertrum SolSalary, Montell D, MD Inpatient   05/21/2017 2338 05/24/2017 2208 DNR [id number]  Oralia ManisWillis, David, MD Inpatient   Advance Care Planning Activity           Consults none  DVT Prophylaxis  Lovenox Lab Results  Component Value Date   PLT 200 05/24/2019     Time Spent in minutes   35 minutes  Greater than 50% of time spent in care coordination and counseling patient regarding the condition and plan of care.   Auburn BilberryShreyang Laryssa Hassing M.D on 05/25/2019 at 12:21 PM  Between 7am to 6pm - Pager - [phone]  After 6pm go to [email or web address] - Social research officer, [email or web address] [email or web address]  [employer]   Office  [phone]

## 2019-05-25 NOTE — Care Management Important Message (Signed)
Important Message  Patient Details  Name: [name] MRN: [id number] Date of Birth: May 19, 1932   Medicare Important Message Given:  Yes     Dannette Barbara 05/25/2019, 11:45 AM

## 2019-05-26 LAB — BASIC METABOLIC PANEL
Anion gap: 7 (ref 5–15)
BUN: 23 mg/dL (ref 8–23)
CO2: 30 mmol/L (ref 22–32)
Calcium: 8.8 mg/dL — ABNORMAL LOW (ref 8.9–10.3)
Chloride: 104 mmol/L (ref 98–111)
Creatinine, Ser: 0.85 mg/dL (ref 0.44–1.00)
GFR calc Af Amer: >60 mL/min (ref >60)
GFR calc non Af Amer: >60 mL/min (ref >60)
Glucose, Bld: 160 mg/dL — ABNORMAL HIGH (ref 70–99)
Potassium: 3.7 mmol/L (ref 3.5–5.1)
Sodium: 141 mmol/L (ref 135–145)

## 2019-05-26 LAB — GLUCOSE, CAPILLARY
Glucose-Capillary: 131 mg/dL — ABNORMAL HIGH (ref 70–99)
Glucose-Capillary: 120 mg/dL — ABNORMAL HIGH (ref 70–99)
Glucose-Capillary: 123 mg/dL — ABNORMAL HIGH (ref 70–99)
Glucose-Capillary: 115 mg/dL — ABNORMAL HIGH (ref 70–99)

## 2019-05-26 NOTE — Progress Notes (Signed)
[place] at [facility]                                                                                                                                                                                  Patient Demographics   [name], is a 85 y.o. female, DOB - Nov 10, 1931, YKD:[id number]  Admit date - 05/23/2019   Admitting Physician Saundra Shelling, MD  Outpatient Primary MD for the patient is Kirk Ruths, MD   LOS - 3  Subjective: Patient is comfortable and denies any complaint.   Review of Systems:   No fever fatigue Denies visual problem double vision or redness Denies hearing deficit or ringing in the ears. Denies shortness of breath cough or wheezing. Denies chest pain or palpitation or edema on the legs Denies nausea vomiting diarrhea or abdominal pain. Denies skin rashes. Denies focal weakness or numbness. Denies depression, suicidal idea.   Vitals:   Vitals:   05/25/19 1238 05/25/19 2010 05/26/19 0420 05/26/19 1218  BP:  (!) 150/55 (!) 144/55 136/66  Pulse:  83 91 72  Resp:  18 17 17   Temp:  98.6 F (37 C) 97.9 F (36.6 C) (!) 97.5 F (36.4 C)  TempSrc:  Oral  Oral  SpO2: 92% 92% 95% 93%  Weight:      Height:        Wt Readings from Last 3 Encounters:  05/23/19 78.5 kg  08/13/18 84.3 kg  06/14/18 83 kg     Intake/Output Summary (Last 24 hours) at 05/26/2019 1404 Last data filed at 05/26/2019 0900 Gross per 24 hour  Intake 478 ml  Output 1500 ml  Net -1022 ml    Physical Exam:   GENERAL: Pleasant-appearing in no apparent distress.  HEAD, EYES, EARS, NOSE AND THROAT: Atraumatic, normocephalic. Extraocular muscles are intact. Pupils equal and reactive to light. Sclerae anicteric. No conjunctival injection. No oro-pharyngeal erythema.  NECK: Supple. There is no jugular venous distention. No bruits, no lymphadenopathy, no thyromegaly.  HEART: Regular rate and rhythm,. No murmurs, no rubs, no clicks.  LUNGS: Clear  to auscultation bilaterally. No rales or rhonchi. No wheezes.  ABDOMEN: Soft, flat, nontender, nondistended. Has good bowel sounds. No hepatosplenomegaly appreciated.  EXTREMITIES: No evidence of any cyanosis, clubbing, or peripheral edema.  +2 pedal and radial pulses bilaterally.  NEUROLOGIC: The patient is alert,  oriented X2 sKIN: Moist and warm with no rashes appreciated.  Psych: Not anxious, depressed LN: No inguinal LN enlargement    Antibiotics   Anti-infectives (From admission, onward)   Start     Dose/Rate Route Frequency Ordered Stop   05/25/19 1800  cefdinir (OMNICEF) capsule 300 mg  300 mg Oral Every 12 hours 05/25/19 1459     05/24/19 1800  cefTRIAXone (ROCEPHIN) 1 g in sodium chloride 0.9 % 100 mL IVPB  Status:  Discontinued     1 g 200 mL/hr over 30 Minutes Intravenous Every 24 hours 05/23/19 1518 05/25/19 1459   05/23/19 1445  cefTRIAXone (ROCEPHIN) 1 g in sodium chloride 0.9 % 100 mL IVPB     1 g 200 mL/hr over 30 Minutes Intravenous  Once 05/23/19 1444 05/23/19 1716      Medications   Scheduled Meds: . amLODipine  5 mg Oral Daily  . aspirin EC  81 mg Oral Daily  . cefdinir  300 mg Oral Q12H  . dicyclomine  20 mg Oral QID  . divalproex  250 mg Oral QHS  . enoxaparin (LOVENOX) injection  40 mg Subcutaneous Q24H  . fluticasone  1 spray Each Nare Daily  . furosemide  40 mg Intravenous Q12H  . insulin aspart  0-15 Units Subcutaneous TID WC  . insulin aspart  0-5 Units Subcutaneous QHS  . irbesartan  75 mg Oral Daily  . loratadine  10 mg Oral Daily  . metFORMIN  500 mg Oral BID WC  . metoprolol tartrate  25 mg Oral BID  . multivitamin with minerals  1 tablet Oral Daily  . olopatadine  1 drop Both Eyes Daily  . pantoprazole  40 mg Oral Daily  . QUEtiapine  12.5 mg Oral Daily  . sodium chloride flush  10-40 mL Intracatheter Q12H  . sodium chloride flush  3 mL Intravenous Q12H   Continuous Infusions: . sodium chloride Stopped (05/24/19 2331)   PRN  Meds:.sodium chloride, acetaminophen **OR** acetaminophen, alum & mag hydroxide-simeth, ipratropium-albuterol, loperamide, LORazepam, magnesium hydroxide, ondansetron **OR** ondansetron (ZOFRAN) IV, polyethylene glycol, sodium chloride flush, sodium chloride flush   Data Review:   Micro Results Recent Results (from the past 240 hour(s))  Blood culture (routine x 2)     Status: None (Preliminary result)   Collection Time: 05/23/19 10:25 AM   Specimen: BLOOD  Result Value Ref Range Status   Specimen Description BLOOD RIGHT ANTECUBITAL  Final   Special Requests   Final    BOTTLES DRAWN AEROBIC AND ANAEROBIC Blood Culture adequate volume   Culture   Final    NO GROWTH 3 DAYS Performed at [facility], [address] [place], [place] [address]    Report Status PENDING  Incomplete  SARS Coronavirus 2 (CEPHEID- Performed in [facility] hospital lab), Hosp Order     Status: None   Collection Time: 05/23/19 10:26 AM   Specimen: Nasopharyngeal Swab  Result Value Ref Range Status   SARS Coronavirus 2 NEGATIVE NEGATIVE Final    Comment: (NOTE) If result is NEGATIVE SARS-CoV-2 target nucleic acids are NOT DETECTED. The SARS-CoV-2 RNA is generally detectable in upper and lower  respiratory specimens during the acute phase of infection. The lowest  concentration of SARS-CoV-2 viral copies this assay can detect is 250  copies / mL. A negative result does not preclude SARS-CoV-2 infection  and should not be used as the sole basis for treatment or other  patient management decisions.  A negative result may occur with  improper specimen collection / handling, submission of specimen other  than nasopharyngeal swab, presence of viral mutation(s) within the  areas targeted by this assay, and inadequate number of viral copies  (<250 copies / mL). A negative result must be combined with clinical  observations, patient history, and epidemiological  information. If result is  POSITIVE SARS-CoV-2 target nucleic acids are DETECTED. The SARS-CoV-2 RNA is generally detectable in upper and lower  respiratory specimens dur ing the acute phase of infection.  Positive  results are indicative of active infection with SARS-CoV-2.  Clinical  correlation with patient history and other diagnostic information is  necessary to determine patient infection status.  Positive results do  not rule out bacterial infection or co-infection with other viruses. If result is PRESUMPTIVE POSTIVE SARS-CoV-2 nucleic acids MAY BE PRESENT.   A presumptive positive result was obtained on the submitted specimen  and confirmed on repeat testing.  While 2019 novel coronavirus  (SARS-CoV-2) nucleic acids may be present in the submitted sample  additional confirmatory testing may be necessary for epidemiological  and / or clinical management purposes  to differentiate between  SARS-CoV-2 and other Sarbecovirus currently known to infect humans.  If clinically indicated additional testing with an alternate test  methodology [phone] is advised. The SARS-CoV-2 RNA is generally  detectable in upper and lower respiratory sp ecimens during the acute  phase of infection. The expected result is Negative. Fact Sheet for Patients:  BoilerBrush.com.cyhttps://www.fda.gov/media/136312/download Fact Sheet for Healthcare Providers: https://pope.com/https://www.fda.gov/media/136313/download This test is not yet approved or cleared by the [place] FDA and has been authorized for detection and/or diagnosis of SARS-CoV-2 by FDA under an Emergency Use Authorization (EUA).  This EUA will remain in effect (meaning this test can be used) for the duration of the COVID-19 declaration under Section 564(b)(1) of the Act, 21 U.S.C. section 360bbb-3(b)(1), unless the authorization is terminated or revoked sooner. Performed at [facility], [address] [place], [place] [address]   Blood culture (routine x 2)     Status: None  (Preliminary result)   Collection Time: 05/23/19 10:30 AM   Specimen: BLOOD  Result Value Ref Range Status   Specimen Description BLOOD LEFT ANTECUBITAL  Final   Special Requests   Final    BOTTLES DRAWN AEROBIC AND ANAEROBIC Blood Culture adequate volume   Culture   Final    NO GROWTH 3 DAYS Performed at [facility], [address] [place], [place] [address]    Report Status PENDING  Incomplete    Radiology Reports Dg Chest Port 1 View  Result Date: 05/23/2019 CLINICAL DATA:  Short of breath EXAM: PORTABLE CHEST 1 VIEW COMPARISON:  08/20/2018 FINDINGS: Cardiac enlargement with pulmonary vascular congestion. Overall improved aeration compared to the prior study. No pleural effusion. Surgical clips right paratracheal region. IMPRESSION: Cardiac enlargement with mild vascular congestion. Negative for edema or pneumonia. Electronically Signed   By: Marlan Palauharles  Clark M.D.   On: 05/23/2019 11:11     CBC Recent Labs  Lab 05/23/19 1036 05/24/19 0517  WBC 9.1 7.9  HGB 11.0* 11.0*  HCT 36.8 37.0  PLT 214 200  MCV 85.4 87.3  MCH 25.5* 25.9*  MCHC 29.9* 29.7*  RDW 14.3 14.2  LYMPHSABS 1.3  --   MONOABS 0.8  --   EOSABS 0.2  --   BASOSABS 0.0  --     Chemistries  Recent Labs  Lab 05/23/19 1036 05/24/19 0517 05/26/19 0425  NA 142 143 141  K 4.4 4.5 3.7  CL 109 109 104  CO2 27 27 30   GLUCOSE 134* 141* 160*  BUN 17 18 23   CREATININE 0.69 0.66 0.85  CALCIUM 9.2 9.1 8.8*  MG 1.6*  --   --   AST 14*  --   --  ALT 19  --   --   ALKPHOS 93  --   --   BILITOT 0.5  --   --    ------------------------------------------------------------------------------------------------------------------ estimated creatinine clearance is 51 mL/min (by C-G formula based on SCr of 0.85 mg/dL). ------------------------------------------------------------------------------------------------------------------ Recent Labs    05/23/19 1716  HGBA1C 6.7*    ------------------------------------------------------------------------------------------------------------------ No results for input(s): CHOL, HDL, LDLCALC, TRIG, CHOLHDL, LDLDIRECT in the last 72 hours. ------------------------------------------------------------------------------------------------------------------ No results for input(s): TSH, T4TOTAL, T3FREE, THYROIDAB in the last 72 hours.  Invalid input(s): FREET3 ------------------------------------------------------------------------------------------------------------------ No results for input(s): VITAMINB12, FOLATE, FERRITIN, TIBC, IRON, RETICCTPCT in the last 72 hours.  Coagulation profile No results for input(s): INR, PROTIME in the last 168 hours.  No results for input(s): DDIMER in the last 72 hours.  Cardiac Enzymes No results for input(s): CKMB, TROPONINI, MYOGLOBIN in the last 168 hours.  Invalid input(s): CK ------------------------------------------------------------------------------------------------------------------ Invalid input(s): POCBNP    Assessment & Plan   IMPRESSION AND PLAN: 85 year old female patient with a known history of type 2 diabetes mellitus, GERD, hyperlipidemia, neuropathy, anxiety disorder presented to the emergency room for difficulty breathing.    -Acute respiratory distress with hypoxia This is due to fluid overload continue IV fluid Lasix Wean oxygen as tolerated- on room air now.  -Fluid overload-acute diastolic congestive heart failure Echocardiogram done-dilated inferior vena cava.  -Urinary tract infection IV Rocephin antibiotic daily Urine cultures not sent.  -Type 2 diabetes mellitus Diabetic diet with sliding scale coverage with insulin  -Hypertension Continue beta-blocker  -DVT prophylaxis subcu Lovenox daily     Code Status Orders  (From admission, onward)         Start     Ordered   05/23/19 1926  Do not attempt resuscitation (DNR)  Continuous     Question Answer Comment  In the event of cardiac or respiratory ARREST Do not call a "code blue"   In the event of cardiac or respiratory ARREST Do not perform Intubation, CPR, defibrillation or ACLS   In the event of cardiac or respiratory ARREST Use medication by any route, position, wound care, and other measures to relive pain and suffering. May use oxygen, suction and manual treatment of airway obstruction as needed for comfort.   Comments Nurse may pronounce      05/23/19 1925        Code Status History    Date Active Date Inactive Code Status Order ID Comments User Context   08/11/2018 2201 08/24/2018 2045 DNR [id number]  Bertrum SolSalary, Montell D, MD Inpatient   05/21/2017 2338 05/24/2017 2208 DNR [id number]  Oralia ManisWillis, David, MD Inpatient   Advance Care Planning Activity        Consults none  DVT Prophylaxis  Lovenox Lab Results  Component Value Date   PLT 200 05/24/2019     Time Spent in minutes   35 minutes  Greater than 50% of time spent in care coordination and counseling patient regarding the condition and plan of care.  I called patient's son, voicemail is full and could not leave him voicemail.  Altamese DillingVaibhavkumar Brenyn Petrey M.D on 05/26/2019 at 2:04 PM  Between 7am to 6pm - Pager - [phone]  After 6pm go to [email or web address] - Social research officer, [email or web address] [email or web address]  [employer]   Office  [phone]

## 2019-05-26 NOTE — TOC Progression Note (Addendum)
Transition of Care [facility]) - Progression Note    Patient Details  Name: [name] MRN: [id number] Date of Birth: Apr 10, 1932  Transition [facility] CM/SW [place], [place] Phone Number: 05/26/2019, 9:53 AM  Clinical Narrative:   The CSW has attempted to contact the patient's son at both listed numbers. Unfortunately, no answer was received and both mail boxes are full. The CSW will continue to attempt contact with the patient's son to determine if he is in agreement with the patient returning to [facility] [place] vs. SNF placement for short term rehab. The [facility] team will continue to follow.  UPDATE: CSW attempted to contact the patient's son at 28 with no ability to leave voicemail. TOC will continue to attempt contact to discuss discharge planning.  UPDATE: The patient's son contacted the [facility] team and shared that he would like to pursue SNF placement with [facility] as the preference. The CSW has begun the referral and will update with bed offers.   Expected Discharge Plan: Assisted Living Barriers to Discharge: Barriers Resolved  Expected Discharge Plan and Services Expected Discharge Plan: Assisted Living   Discharge Planning Services: CM Consult Post Acute Care Choice: [place] arrangements for the past 2 months: [place] Expected Discharge Date: 05/25/19                         HH Arranged: RN, PT [place] Agency: [place]   Time [place]: [phone] Representative spoke with at [place]: [place] Determinants of Health ([place]) Interventions    Readmission Risk Interventions Readmission Risk Prevention Plan 05/24/2019  Transportation Screening Complete  Medication Review Press photographer) Complete  Some recent data might be hidden

## 2019-05-27 LAB — GLUCOSE, CAPILLARY
Glucose-Capillary: 128 mg/dL — ABNORMAL HIGH (ref 70–99)
Glucose-Capillary: 138 mg/dL — ABNORMAL HIGH (ref 70–99)
Glucose-Capillary: 94 mg/dL (ref 70–99)

## 2019-05-27 MED ORDER — POTASSIUM CHLORIDE CRYS ER 20 MEQ PO TBCR
20.0000 meq | EXTENDED_RELEASE_TABLET | Freq: Two times a day (BID) | ORAL | Status: DC
  Administered 2019-05-27 (×2): 20 meq via ORAL
  Filled 2019-05-27 (×3): qty 1

## 2019-05-27 NOTE — Progress Notes (Signed)
[facility]                                                                                                                                                                                  Patient Demographics   [name], is a 85 y.o. female, DOB - 1932/10/14, KDT:[id number]  Admit date - 05/23/2019   Admitting Physician Saundra Shelling, MD  Outpatient Primary MD for the patient is Kirk Ruths, MD   LOS - 4  Subjective: Patient is comfortable and denies any complaint.  Says she feels stronger to go back to her facility rather than going to rehab.   Review of Systems:   No fever fatigue Denies visual problem double vision or redness Denies hearing deficit or ringing in the ears. Denies shortness of breath cough or wheezing. Denies chest pain or palpitation or edema on the legs Denies nausea vomiting diarrhea or abdominal pain. Denies skin rashes. Denies focal weakness or numbness. Denies depression, suicidal idea.   Vitals:   Vitals:   05/26/19 2037 05/26/19 2039 05/27/19 0436 05/27/19 1159  BP: (!) 163/81  (!) 139/53 (!) 96/48  Pulse: 82 76 70 (!) 54  Resp: 20  18 19   Temp: 98 F (36.7 C)  97.9 F (36.6 C) 97.7 F (36.5 C)  TempSrc: Oral  Oral Oral  SpO2: 92% 96% 93% 94%  Weight:      Height:        Wt Readings from Last 3 Encounters:  05/23/19 78.5 kg  08/13/18 84.3 kg  06/14/18 83 kg     Intake/Output Summary (Last 24 hours) at 05/27/2019 1407 Last data filed at 05/26/2019 1846 Gross per 24 hour  Intake 240 ml  Output -  Net 240 ml    Physical Exam:   GENERAL: Pleasant-appearing in no apparent distress.  HEAD, EYES, EARS, NOSE AND THROAT: Atraumatic, normocephalic. Extraocular muscles are intact. Pupils equal and reactive to light. Sclerae anicteric. No conjunctival injection. No oro-pharyngeal erythema.  NECK: Supple. There is no jugular venous distention. No bruits, no lymphadenopathy, no thyromegaly.   HEART: Regular rate and rhythm,. No murmurs, no rubs, no clicks.  LUNGS: Clear to auscultation bilaterally. No rales or rhonchi. No wheezes.  ABDOMEN: Soft, flat, nontender, nondistended. Has good bowel sounds. No hepatosplenomegaly appreciated.  EXTREMITIES: No evidence of any cyanosis, clubbing, or peripheral edema.  +2 pedal and radial pulses bilaterally.  NEUROLOGIC: The patient is alert,  oriented X 3 sKIN: Moist and warm with no rashes appreciated.  Psych: Not anxious, depressed LN: No inguinal LN enlargement    Antibiotics   Anti-infectives (From admission, onward)   Start  Dose/Rate Route Frequency Ordered Stop   05/25/19 1800  cefdinir (OMNICEF) capsule 300 mg     300 mg Oral Every 12 hours 05/25/19 1459     05/24/19 1800  cefTRIAXone (ROCEPHIN) 1 g in sodium chloride 0.9 % 100 mL IVPB  Status:  Discontinued     1 g 200 mL/hr over 30 Minutes Intravenous Every 24 hours 05/23/19 1518 05/25/19 1459   05/23/19 1445  cefTRIAXone (ROCEPHIN) 1 g in sodium chloride 0.9 % 100 mL IVPB     1 g 200 mL/hr over 30 Minutes Intravenous  Once 05/23/19 1444 05/23/19 1716      Medications   Scheduled Meds: . amLODipine  5 mg Oral Daily  . aspirin EC  81 mg Oral Daily  . cefdinir  300 mg Oral Q12H  . dicyclomine  20 mg Oral QID  . divalproex  250 mg Oral QHS  . enoxaparin (LOVENOX) injection  40 mg Subcutaneous Q24H  . fluticasone  1 spray Each Nare Daily  . furosemide  40 mg Intravenous Q12H  . insulin aspart  0-15 Units Subcutaneous TID WC  . insulin aspart  0-5 Units Subcutaneous QHS  . irbesartan  75 mg Oral Daily  . loratadine  10 mg Oral Daily  . metFORMIN  500 mg Oral BID WC  . metoprolol tartrate  25 mg Oral BID  . multivitamin with minerals  1 tablet Oral Daily  . olopatadine  1 drop Both Eyes Daily  . pantoprazole  40 mg Oral Daily  . potassium chloride  20 mEq Oral BID  . QUEtiapine  12.5 mg Oral Daily  . sodium chloride flush  10-40 mL Intracatheter Q12H  .  sodium chloride flush  3 mL Intravenous Q12H   Continuous Infusions: . sodium chloride Stopped (05/24/19 2331)   PRN Meds:.sodium chloride, acetaminophen **OR** acetaminophen, alum & mag hydroxide-simeth, ipratropium-albuterol, loperamide, LORazepam, magnesium hydroxide, ondansetron **OR** ondansetron (ZOFRAN) IV, polyethylene glycol, sodium chloride flush, sodium chloride flush   Data Review:   Micro Results Recent Results (from the past 240 hour(s))  Blood culture (routine x 2)     Status: None (Preliminary result)   Collection Time: 05/23/19 10:25 AM   Specimen: BLOOD  Result Value Ref Range Status   Specimen Description BLOOD RIGHT ANTECUBITAL  Final   Special Requests   Final    BOTTLES DRAWN AEROBIC AND ANAEROBIC Blood Culture adequate volume   Culture   Final    NO GROWTH 4 DAYS Performed at [facility], [address] [place], [place] [address]    Report Status PENDING  Incomplete  SARS Coronavirus 2 (CEPHEID- Performed in [facility]), Hosp Order     Status: None   Collection Time: 05/23/19 10:26 AM   Specimen: Nasopharyngeal Swab  Result Value Ref Range Status   SARS Coronavirus 2 NEGATIVE NEGATIVE Final    Comment: (NOTE) If result is NEGATIVE SARS-CoV-2 target nucleic acids are NOT DETECTED. The SARS-CoV-2 RNA is generally detectable in upper and lower  respiratory specimens during the acute phase of infection. The lowest  concentration of SARS-CoV-2 viral copies this assay can detect is 250  copies / mL. A negative result does not preclude SARS-CoV-2 infection  and should not be used as the sole basis for treatment or other  patient management decisions.  A negative result may occur with  improper specimen collection / handling, submission of specimen other  than nasopharyngeal swab, presence of viral mutation(s) within the  areas targeted  by this assay, and inadequate number of viral copies  (<250 copies / mL). A negative result must be  combined with clinical  observations, patient history, and epidemiological information. If result is POSITIVE SARS-CoV-2 target nucleic acids are DETECTED. The SARS-CoV-2 RNA is generally detectable in upper and lower  respiratory specimens dur ing the acute phase of infection.  Positive  results are indicative of active infection with SARS-CoV-2.  Clinical  correlation with patient history and other diagnostic information is  necessary to determine patient infection status.  Positive results do  not rule out bacterial infection or co-infection with other viruses. If result is PRESUMPTIVE POSTIVE SARS-CoV-2 nucleic acids MAY BE PRESENT.   A presumptive positive result was obtained on the submitted specimen  and confirmed on repeat testing.  While 2019 novel coronavirus  (SARS-CoV-2) nucleic acids may be present in the submitted sample  additional confirmatory testing may be necessary for epidemiological  and / or clinical management purposes  to differentiate between  SARS-CoV-2 and other Sarbecovirus currently known to infect humans.  If clinically indicated additional testing with an alternate test  methodology [phone]([id number]) is advised. The SARS-CoV-2 RNA is generally  detectable in upper and lower respiratory sp ecimens during the acute  phase of infection. The expected result is Negative. Fact Sheet for Patients:  BoilerBrush.com.cyhttps://www.fda.gov/media/136312/download Fact Sheet for Healthcare Providers: https://pope.com/https://www.fda.gov/media/136313/download This test is not yet approved or cleared by the [place] FDA and has been authorized for detection and/or diagnosis of SARS-CoV-2 by FDA under an Emergency Use Authorization (EUA).  This EUA will remain in effect (meaning this test can be used) for the duration of the COVID-19 declaration under Section 564(b)(1) of the Act, 21 U.S.C. section 360bbb-3(b)(1), unless the authorization is terminated or revoked sooner. Performed at [facility], [address] [place], [place] [address]   Blood culture (routine x 2)     Status: None (Preliminary result)   Collection Time: 05/23/19 10:30 AM   Specimen: BLOOD  Result Value Ref Range Status   Specimen Description BLOOD LEFT ANTECUBITAL  Final   Special Requests   Final    BOTTLES DRAWN AEROBIC AND ANAEROBIC Blood Culture adequate volume   Culture   Final    NO GROWTH 4 DAYS Performed at [facility], [address] [place], [place] [address]    Report Status PENDING  Incomplete    Radiology Reports Dg Chest Port 1 View  Result Date: 05/23/2019 CLINICAL DATA:  Short of breath EXAM: PORTABLE CHEST 1 VIEW COMPARISON:  08/20/2018 FINDINGS: Cardiac enlargement with pulmonary vascular congestion. Overall improved aeration compared to the prior study. No pleural effusion. Surgical clips right paratracheal region. IMPRESSION: Cardiac enlargement with mild vascular congestion. Negative for edema or pneumonia. Electronically Signed   By: Marlan Palauharles  Clark M.D.   On: 05/23/2019 11:11     CBC Recent Labs  Lab 05/23/19 1036 05/24/19 0517  WBC 9.1 7.9  HGB 11.0* 11.0*  HCT 36.8 37.0  PLT 214 200  MCV 85.4 87.3  MCH 25.5* 25.9*  MCHC 29.9* 29.7*  RDW 14.3 14.2  LYMPHSABS 1.3  --   MONOABS 0.8  --   EOSABS 0.2  --   BASOSABS 0.0  --     Chemistries  Recent Labs  Lab 05/23/19 1036 05/24/19 0517 05/26/19 0425  NA 142 143 141  K 4.4 4.5 3.7  CL 109 109 104  CO2 27 27 30   GLUCOSE 134* 141* 160*  BUN 17 18 23  CREATININE 0.69 0.66 0.85  CALCIUM 9.2 9.1 8.8*  MG 1.6*  --   --   AST 14*  --   --   ALT 19  --   --   ALKPHOS 93  --   --   BILITOT 0.5  --   --    ------------------------------------------------------------------------------------------------------------------ estimated creatinine clearance is 51 mL/min (by C-G formula based on SCr of 0.85  mg/dL). ------------------------------------------------------------------------------------------------------------------ No results for input(s): HGBA1C in the last 72 hours. ------------------------------------------------------------------------------------------------------------------ No results for input(s): CHOL, HDL, LDLCALC, TRIG, CHOLHDL, LDLDIRECT in the last 72 hours. ------------------------------------------------------------------------------------------------------------------ No results for input(s): TSH, T4TOTAL, T3FREE, THYROIDAB in the last 72 hours.  Invalid input(s): FREET3 ------------------------------------------------------------------------------------------------------------------ No results for input(s): VITAMINB12, FOLATE, FERRITIN, TIBC, IRON, RETICCTPCT in the last 72 hours.  Coagulation profile No results for input(s): INR, PROTIME in the last 168 hours.  No results for input(s): DDIMER in the last 72 hours.  Cardiac Enzymes No results for input(s): CKMB, TROPONINI, MYOGLOBIN in the last 168 hours.  Invalid input(s): CK ------------------------------------------------------------------------------------------------------------------ Invalid input(s): POCBNP    Assessment & Plan   IMPRESSION AND PLAN: 85 year old female patient with a known history of type 2 diabetes mellitus, GERD, hyperlipidemia, neuropathy, anxiety disorder presented to the emergency room for difficulty breathing.    -Acute respiratory distress with hypoxia This is due to fluid overload continue IV fluid Lasix Wean oxygen as tolerated- on room air now.  -Fluid overload-acute diastolic congestive heart failure Echocardiogram done-dilated inferior vena cava.  -Urinary tract infection IV Rocephin antibiotic daily Urine cultures not sent.  -Type 2 diabetes mellitus Diabetic diet with sliding scale coverage with insulin  -Hypertension Continue beta-blocker  -DVT  prophylaxis subcu Lovenox daily     Code Status Orders  (From admission, onward)         Start     Ordered   05/23/19 1926  Do not attempt resuscitation (DNR)  Continuous    Question Answer Comment  In the event of cardiac or respiratory ARREST Do not call a "code blue"   In the event of cardiac or respiratory ARREST Do not perform Intubation, CPR, defibrillation or ACLS   In the event of cardiac or respiratory ARREST Use medication by any route, position, wound care, and other measures to relive pain and suffering. May use oxygen, suction and manual treatment of airway obstruction as needed for comfort.   Comments Nurse may pronounce      05/23/19 1925        Code Status History    Date Active Date Inactive Code Status Order ID Comments User Context   08/11/2018 2201 08/24/2018 2045 DNR [id number]  Bertrum SolSalary, Montell D, MD Inpatient   05/21/2017 2338 05/24/2017 2208 DNR [id number]  Oralia ManisWillis, David, MD Inpatient   Advance Care Planning Activity      Consults none  DVT Prophylaxis  Lovenox Lab Results  Component Value Date   PLT 200 05/24/2019   I spoke to patient's son regarding patient's progress and treatment plan.  Time Spent in minutes   35 minutes  Greater than 50% of time spent in care coordination and counseling patient regarding the condition and plan of care.   Altamese DillingVaibhavkumar Jamal Haskin M.D on 05/27/2019 at 2:07 PM  Between 7am to 6pm - Pager - [phone]  After 6pm go to [email or web address] - Social research officer, governmentpassword [email or web address]  [employer]   Office  [phone]

## 2019-05-27 NOTE — Progress Notes (Signed)
Physical Therapy Treatment Patient Details Name: [name] MRN: [id number] DOB: [birth year] Today's Date: 05/27/2019    History of Present Illness [name] is an 85 y.o. female who was admitted to the hospital with medical diagnosis of acute respitory distress with hypoxia, fluid overload, and UTI after being brought to the ED on 05/23/2019 from [facility] assisted living by EMS due to 4 days decline in strength and mentation. She was hypoxic upon arrival and ECK showed R BBB. Chest x-ray showed vascular congestion and urinalisis showed infection. She is intermittantly confused and could not provide reliable history at initial eval. Relevant PMH includes dementia, neuropathy, DM, HTN, anxiety, vertigo, myocytic anemia, GERD, Hx of apsiration pneumonia, hip fracture, and back sx for tumor removal.    PT Comments    Pt side lying in bed.  sats 92% on room air.  To edge of bed with min assist.  Once sitting, she is steady.  She stands with min guard and is able to walk 200' with walker and min guard.  Generally steady but requires +1 assist for safety.  No LOB or buckling noted.  Sats 96% after gait.  Remained in recliner.  Pt has progressed well with mobility with increased tolerance, speed and balance.  She lives at [facility].  Due to progress she should be able to return home to ALF with continued PT and +1 assist for mobility.  Will update discharge recommendations to reflect current status.    Follow Up Recommendations  Home health PT;Supervision for mobility/OOB     Equipment Recommendations  Rolling walker with 5" wheels;3in1 (PT)    Recommendations for Other Services       Precautions / Restrictions Precautions Precautions: Fall Restrictions Weight Bearing Restrictions: No    Mobility  Bed Mobility Overal bed mobility: Needs Assistance Bed Mobility: Supine to Sit     Supine to sit: Min assist        Transfers Overall transfer level: Needs  assistance Equipment used: Rolling walker (2 wheeled) Transfers: Sit to/from Stand Sit to Stand: Min guard            Ambulation/Gait Ambulation/Gait assistance: Min guard Gait Distance (Feet): 200 Feet Assistive device: Rolling walker (2 wheeled) Gait Pattern/deviations: Step-through pattern;Decreased step length - right;Decreased step length - left Gait velocity: increased speed today       Stairs             Wheelchair Mobility    Modified Rankin (Stroke Patients Only)       Balance Overall balance assessment: Needs assistance Sitting-balance support: Feet supported Sitting balance-Leahy Scale: Good     Standing balance support: Bilateral upper extremity supported Standing balance-Leahy Scale: Fair                              Cognition Arousal/Alertness: Awake/alert Behavior During Therapy: WFL for tasks assessed/performed Overall Cognitive Status: Within Functional Limits for tasks assessed                                        Exercises      General Comments        Pertinent Vitals/Pain Pain Assessment: No/denies pain    Home Living                      Prior Function  PT Goals (current goals can now be found in the care plan section) Progress towards PT goals: Progressing toward goals    Frequency    Min 2X/week      PT Plan Discharge plan needs to be updated    Co-evaluation              AM-PAC PT "6 Clicks" Mobility   Outcome Measure  Help needed turning from your back to your side while in a flat bed without using bedrails?: A Little Help needed moving from lying on your back to sitting on the side of a flat bed without using bedrails?: A Little Help needed moving to and from a bed to a chair (including a wheelchair)?: A Little Help needed standing up from a chair using your arms (e.g., wheelchair or bedside chair)?: A Little Help needed to walk in hospital room?:  A Little Help needed climbing 3-5 steps with a railing? : A Lot 6 Click Score: 17    End of Session Equipment Utilized During Treatment: Gait belt Activity Tolerance: Patient tolerated treatment well;Other (comment) Patient left: in chair;with call bell/phone within reach;with chair alarm set         Time: [phone] PT Time Calculation (min) (ACUTE ONLY): 16 min  Charges:  $Gait Training: 8-22 mins                    Danielle DessSarah Flora Parks, PTA 05/27/19, 1:50 PM

## 2019-05-28 LAB — CULTURE, BLOOD (ROUTINE X 2)
Culture: NO GROWTH
Culture: NO GROWTH
Special Requests: ADEQUATE
Special Requests: ADEQUATE

## 2019-05-28 LAB — GLUCOSE, CAPILLARY
Glucose-Capillary: 154 mg/dL — ABNORMAL HIGH (ref 70–99)
Glucose-Capillary: 136 mg/dL — ABNORMAL HIGH (ref 70–99)
Glucose-Capillary: 165 mg/dL — ABNORMAL HIGH (ref 70–99)
Glucose-Capillary: 126 mg/dL — ABNORMAL HIGH (ref 70–99)

## 2019-05-28 LAB — BASIC METABOLIC PANEL
Anion gap: 9 (ref 5–15)
BUN: 34 mg/dL — ABNORMAL HIGH (ref 8–23)
CO2: 30 mmol/L (ref 22–32)
Calcium: 8.7 mg/dL — ABNORMAL LOW (ref 8.9–10.3)
Chloride: 99 mmol/L (ref 98–111)
Creatinine, Ser: 1.1 mg/dL — ABNORMAL HIGH (ref 0.44–1.00)
GFR calc Af Amer: 53 mL/min — ABNORMAL LOW (ref >60)
GFR calc non Af Amer: 46 mL/min — ABNORMAL LOW (ref >60)
Glucose, Bld: 138 mg/dL — ABNORMAL HIGH (ref 70–99)
Potassium: 4.9 mmol/L (ref 3.5–5.1)
Sodium: 138 mmol/L (ref 135–145)

## 2019-05-28 MED ORDER — FUROSEMIDE 20 MG PO TABS: 20.0000 mg | ORAL_TABLET | Freq: Every day | ORAL | 0 refills | Status: DC

## 2019-05-28 MED ORDER — POLYETHYLENE GLYCOL 3350 17 G PO PACK: 17.0000 g | PACK | Freq: Every day | ORAL | 0 refills | Status: DC | PRN

## 2019-05-28 MED ORDER — FUROSEMIDE 20 MG PO TABS
20.0000 mg | ORAL_TABLET | Freq: Every day | ORAL | Status: DC
  Administered 2019-05-28: 20 mg via ORAL
  Filled 2019-05-28: qty 1

## 2019-05-28 NOTE — TOC Transition Note (Signed)
Transition of Care [facility]) - CM/SW Discharge Note   Patient Details  Name: [name] MRN: [id number] Date of Birth: 1932/08/22  [facility] CM/SW Contact:  Beverly Sessions, RN Phone Number: 05/28/2019, 3:44 PM   Clinical Narrative:     Patient to return to [facility] today RNCM confirmed with Tammy from [facility] that patient can return   Patient to have [facility] health [facility] Cheryl with [facility] notified of discharge  [facility] does not have means to transpot patient today.  Initially son was going to transport, but he has since called back and said he is unable to.    Patient to go by EMS.  Packet provided to bedside RN   Final next level of care: Assisted Living Barriers to Discharge: Barriers Resolved   Patient Goals and CMS Choice   CMS Medicare.gov Compare Post Acute Care list provided to:: Patient Choice offered to / list presented to : Patient  Discharge Placement                Patient to be transferred to facility by: EMS Name of family member notified: [name] Patient and family notified of of transfer: 05/28/19  Discharge Plan and Services   Discharge Planning Services: CM Consult Post Acute Care Choice: Home Health                    HH Arranged: RN, PT, OT [facility] Agency: [place] Date [place]: 05/28/19 Time [place]: [id number] Representative spoke with at [place]: [place] Interventions     Readmission Risk Interventions Readmission Risk Prevention Plan 05/27/2019 05/24/2019  Transportation Screening Complete Complete  Social Work Consult for [place] Planning/Counseling Complete -  Medication Review Press photographer) Complete Complete  Some recent data might be hidden

## 2019-05-28 NOTE — Care Management Important Message (Signed)
Important Message  Patient Details  Name: [name] MRN: [id number] Date of Birth: Apr 18, 1932   Medicare Important Message Given:  Yes     Dannette Barbara 05/28/2019, 11:31 AM

## 2019-05-28 NOTE — Discharge Summary (Signed)
[facility] - [place] [facility]   PATIENT NAME: [name]    MR#:  [id number]  DATE OF BIRTH:  1932/07/02  DATE OF ADMISSION:  05/23/2019 ADMITTING PHYSICIAN: Ihor AustinPavan Pyreddy, MD  DATE OF DISCHARGE: 05/28/2019   PRIMARY CARE PHYSICIAN: [name], Marshall W, MD    ADMISSION DIAGNOSIS:  Hypoxia [R09.02] Acute cystitis without hematuria [N30.00]  DISCHARGE DIAGNOSIS:  Active Problems:   Hypoxia   SECONDARY DIAGNOSIS:   Past Medical History:  Diagnosis Date  . Anxiety   . Depression   . Diabetes mellitus without complication (HCC)   . GERD (gastroesophageal reflux disease)   . Hip fracture (HCC)   . Hyperlipemia   . Neuropathy   . Vertigo     HOSPITAL COURSE:   85 year old female patientwith a known history of type 2 diabetes mellitus, GERD, hyperlipidemia, neuropathy, anxiety disorder presented to the emergency room for difficulty breathing.   -Acute respiratory distress with hypoxia This is due to fluid overload continue IV fluid Lasix Wean oxygen as tolerated- on room air now.  -Fluid overload-acute diastolic congestive heart failure Echocardiogram done-dilated inferior vena cava.  -Urinary tract infection IV Rocephin antibiotic daily Urine cultures not sent.  -Type 2 diabetes mellitus Diabetic diet with sliding scale coverage with insulin  -Hypertension Continue beta-blocker  -DVT prophylaxis subcu Lovenox daily  DISCHARGE CONDITIONS:   Stable.  CONSULTS OBTAINED:    DRUG ALLERGIES:   Allergies  Allergen Reactions  . Ampicillin     Zpac  . Sulfa Antibiotics     DISCHARGE MEDICATIONS:   Allergies as of 05/28/2019      Reactions   Ampicillin    Zpac   Sulfa Antibiotics       Medication List    TAKE these medications   acetaminophen 325 MG tablet Commonly known as: TYLENOL Take 650 mg by mouth every 4 (four) hours as needed for mild pain or moderate pain.   alum & mag hydroxide-simeth 200-200-20  MG/5ML suspension Commonly known as: MAALOX/MYLANTA Take 30 mLs by mouth as needed for indigestion or heartburn.   amLODipine 5 MG tablet Commonly known as: NORVASC Take 5 mg by mouth daily.   aspirin EC 81 MG tablet Take 81 mg by mouth daily.   bismuth subsalicylate 262 MG chewable tablet Commonly known as: PEPTO BISMOL Chew 262 mg by mouth as needed.   calcium carbonate 500 MG chewable tablet Commonly known as: TUMS - dosed in mg elemental calcium Chew 2 tablets (400 mg of elemental calcium total) by mouth 3 (three) times daily as needed for indigestion or heartburn.   cetirizine 10 MG tablet Commonly known as: ZYRTEC Take 10 mg by mouth daily.   dicyclomine 20 MG tablet Commonly known as: BENTYL Take 20 mg by mouth 4 (four) times daily.   divalproex 250 MG 24 hr tablet Commonly known as: DEPAKOTE ER Take 250 mg by mouth at bedtime.   esomeprazole 20 MG capsule Commonly known as: NEXIUM Take 20 mg by mouth daily.   fluticasone 50 MCG/ACT nasal spray Commonly known as: FLONASE Place 1 spray into both nostrils daily.   furosemide 20 MG tablet Commonly known as: LASIX Take 1 tablet (20 mg total) by mouth daily. Start taking on: May 29, 2019   insulin aspart 100 UNIT/ML injection Commonly known as: novoLOG 3 units with each meal if she eats What changed: additional instructions   ipratropium-albuterol 0.5-2.5 (3) MG/3ML Soln Commonly known as: DUONEB Take 3 mLs by nebulization 2 (two)  times daily.   ketotifen 0.025 % ophthalmic solution Commonly known as: ZADITOR Place 1 drop into both eyes 2 (two) times daily.   loperamide 2 MG capsule Commonly known as: IMODIUM Take 2 mg by mouth daily as needed for diarrhea or loose stools.   LORazepam 0.5 MG tablet Commonly known as: ATIVAN Take 0.5 mg by mouth every 8 (eight) hours.   magnesium hydroxide 400 MG/5ML suspension Commonly known as: MILK OF MAGNESIA Take 30 mLs by mouth daily as needed for mild  constipation.   metFORMIN 500 MG 24 hr tablet Commonly known as: GLUCOPHAGE-XR Take 500 mg by mouth 2 (two) times a day.   metoprolol tartrate 25 MG tablet Commonly known as: LOPRESSOR Take 1 tablet (25 mg total) by mouth 2 (two) times daily.   multivitamin with minerals Tabs tablet Take 1 tablet by mouth daily.   olopatadine 0.1 % ophthalmic solution Commonly known as: PATANOL Place 1 drop into both eyes daily.   ondansetron 4 MG tablet Commonly known as: ZOFRAN Take 4 mg by mouth every 8 (eight) hours as needed for nausea or vomiting.   polyethylene glycol 17 g packet Commonly known as: MIRALAX / GLYCOLAX Take 17 g by mouth daily as needed for mild constipation.   psyllium 58.6 % powder Commonly known as: METAMUCIL Take 1 packet by mouth 2 (two) times a day.   QUEtiapine 25 MG tablet Commonly known as: SEROQUEL Take 1 tablet (25 mg total) by mouth at bedtime as needed (psychosis, insomnia). What changed:   how much to take  when to take this   sodium phosphate Pediatric 3.5-9.5 GM/59ML enema Place 1 enema rectally once as needed for severe constipation.   telmisartan 40 MG tablet Commonly known as: MICARDIS Take 40 mg by mouth daily.        DISCHARGE INSTRUCTIONS:    Follow with PMD in 1-2 weeks.  If you experience worsening of your admission symptoms, develop shortness of breath, life threatening emergency, suicidal or homicidal thoughts you must seek medical attention immediately by calling 911 or calling your MD immediately  if symptoms less severe.  You Must read complete instructions/literature along with all the possible adverse reactions/side effects for all the Medicines you take and that have been prescribed to you. Take any new Medicines after you have completely understood and accept all the possible adverse reactions/side effects.   Please note  You were cared for by a hospitalist during your hospital stay. If you have any questions about your  discharge medications or the care you received while you were in the hospital after you are discharged, you can call the unit and asked to speak with the hospitalist on call if the hospitalist that took care of you is not available. Once you are discharged, your primary care physician will handle any further medical issues. Please note that NO REFILLS for any discharge medications will be authorized once you are discharged, as it is imperative that you return to your primary care physician (or establish a relationship with a primary care physician if you do not have one) for your aftercare needs so that they can reassess your need for medications and monitor your lab values.    Today   CHIEF COMPLAINT:   Chief Complaint  Patient presents with  . Shortness of Breath    HISTORY OF PRESENT ILLNESS:  [name]  is a 85 y.o. female with a known history of type 2 diabetes mellitus, GERD, hyperlipidemia, neuropathy, anxiety disorder presented  to the emergency room for difficulty breathing.  She was short of breath and had low oxygen saturation at presentation.  Initially she was put on nonrebreather mask and later on weaned to oxygen via nasal cannula at 2 L.  Work-up in the emergency room chest x-ray showed vascular congestion.  Urinalysis showed infection.  Has some dysuria.  COVID-19 test negative.  No complaints of chest pain.  Comfortable on oxygen by nasal cannula at 2 L.   VITAL SIGNS:  Blood pressure 114/62, pulse 79, temperature 98.3 F (36.8 C), temperature source Oral, resp. rate 18, height  (1.651 m), weight 78.5 kg, SpO2 98 %.  I/O:    Intake/Output Summary (Last 24 hours) at 05/28/2019 1439 Last data filed at 05/28/2019 1300 Gross per 24 hour  Intake 720 ml  Output 40 ml  Net 680 ml    PHYSICAL EXAMINATION:   GENERAL: Pleasant-appearing in no apparent distress.  HEAD, EYES, EARS, NOSE AND THROAT: Atraumatic, normocephalic. Extraocular muscles are intact. Pupils equal and  reactive to light. Sclerae anicteric. No conjunctival injection. No oro-pharyngeal erythema.  NECK: Supple. There is no jugular venous distention. No bruits, no lymphadenopathy, no thyromegaly.  HEART: Regular rate and rhythm,. No murmurs, no rubs, no clicks.  LUNGS: Clear to auscultation bilaterally. No rales or rhonchi. No wheezes.  ABDOMEN: Soft, flat, nontender, nondistended. Has good bowel sounds. No hepatosplenomegaly appreciated.  EXTREMITIES: No evidence of any cyanosis, clubbing, or peripheral edema.  +2 pedal and radial pulses bilaterally.  NEUROLOGIC: The patient is alert,  oriented X 3 sKIN: Moist and warm with no rashes appreciated.  Psych: Not anxious, depressed LN: No inguinal LN enlargement  DATA REVIEW:   CBC Recent Labs  Lab 05/24/19 0517  WBC 7.9  HGB 11.0*  HCT 37.0  PLT 200    Chemistries  Recent Labs  Lab 05/23/19 1036  05/28/19 0551  NA 142   < > 138  K 4.4   < > 4.9  CL 109   < > 99  CO2 27   < > 30  GLUCOSE 134*   < > 138*  BUN 17   < > 34*  CREATININE 0.69   < > 1.10*  CALCIUM 9.2   < > 8.7*  MG 1.6*  --   --   AST 14*  --   --   ALT 19  --   --   ALKPHOS 93  --   --   BILITOT 0.5  --   --    < > = values in this interval not displayed.    Cardiac Enzymes No results for input(s): TROPONINI in the last 168 hours.  Microbiology Results  Results for orders placed or performed during the hospital encounter of 05/23/19  Blood culture (routine x 2)     Status: None   Collection Time: 05/23/19 10:25 AM   Specimen: BLOOD  Result Value Ref Range Status   Specimen Description BLOOD RIGHT ANTECUBITAL  Final   Special Requests   Final    BOTTLES DRAWN AEROBIC AND ANAEROBIC Blood Culture adequate volume   Culture   Final    NO GROWTH 5 DAYS Performed at [facility], [address], [place], [place] [address]    Report Status 05/28/2019 FINAL  Final  SARS Coronavirus 2 (CEPHEID- Performed in [facility] hospital lab), Hosp Order      Status: None   Collection Time: 05/23/19 10:26 AM   Specimen: Nasopharyngeal Swab  Result Value  Ref Range Status   SARS Coronavirus 2 NEGATIVE NEGATIVE Final    Comment: (NOTE) If result is NEGATIVE SARS-CoV-2 target nucleic acids are NOT DETECTED. The SARS-CoV-2 RNA is generally detectable in upper and lower  respiratory specimens during the acute phase of infection. The lowest  concentration of SARS-CoV-2 viral copies this assay can detect is 250  copies / mL. A negative result does not preclude SARS-CoV-2 infection  and should not be used as the sole basis for treatment or other  patient management decisions.  A negative result may occur with  improper specimen collection / handling, submission of specimen other  than nasopharyngeal swab, presence of viral mutation(s) within the  areas targeted by this assay, and inadequate number of viral copies  (<250 copies / mL). A negative result must be combined with clinical  observations, patient history, and epidemiological information. If result is POSITIVE SARS-CoV-2 target nucleic acids are DETECTED. The SARS-CoV-2 RNA is generally detectable in upper and lower  respiratory specimens dur ing the acute phase of infection.  Positive  results are indicative of active infection with SARS-CoV-2.  Clinical  correlation with patient history and other diagnostic information is  necessary to determine patient infection status.  Positive results do  not rule out bacterial infection or co-infection with other viruses. If result is PRESUMPTIVE POSTIVE SARS-CoV-2 nucleic acids MAY BE PRESENT.   A presumptive positive result was obtained on the submitted specimen  and confirmed on repeat testing.  While 2019 novel coronavirus  (SARS-CoV-2) nucleic acids may be present in the submitted sample  additional confirmatory testing may be necessary for epidemiological  and / or clinical management purposes  to differentiate between  SARS-CoV-2 and other  Sarbecovirus currently known to infect humans.  If clinically indicated additional testing with an alternate test  methodology [phone]) is advised. The SARS-CoV-2 RNA is generally  detectable in upper and lower respiratory sp ecimens during the acute  phase of infection. The expected result is Negative. Fact Sheet for Patients:  StrictlyIdeas.no Fact Sheet for Healthcare Providers: BankingDealers.co.za This test is not yet approved or cleared by the [place] FDA and has been authorized for detection and/or diagnosis of SARS-CoV-2 by FDA under an Emergency Use Authorization (EUA).  This EUA will remain in effect (meaning this test can be used) for the duration of the COVID-19 declaration under Section 564(b)(1) of the Act, 21 U.S.C. section 360bbb-3(b)(1), unless the authorization is terminated or revoked sooner. Performed at [facility], [address] [place], [place] [address]   Blood culture (routine x 2)     Status: None   Collection Time: 05/23/19 10:30 AM   Specimen: BLOOD  Result Value Ref Range Status   Specimen Description BLOOD LEFT ANTECUBITAL  Final   Special Requests   Final    BOTTLES DRAWN AEROBIC AND ANAEROBIC Blood Culture adequate volume   Culture   Final    NO GROWTH 5 DAYS Performed at [facility], [address] [place], [place] [address]    Report Status 05/28/2019 FINAL  Final    RADIOLOGY:  No results found.  EKG:   Orders placed or performed during the hospital encounter of 05/23/19  . EKG 12-Lead  . EKG 12-Lead  . ED EKG  . ED EKG      Management plans discussed with the patient, family and they are in agreement.  CODE STATUS: DNR    Code Status Orders  (From admission, onward)  Start     Ordered   05/23/19 1926  Do not attempt resuscitation (DNR)  Continuous    Question Answer Comment  In the event of cardiac or respiratory ARREST Do not call a  "code blue"   In the event of cardiac or respiratory ARREST Do not perform Intubation, CPR, defibrillation or ACLS   In the event of cardiac or respiratory ARREST Use medication by any route, position, wound care, and other measures to relive pain and suffering. May use oxygen, suction and manual treatment of airway obstruction as needed for comfort.   Comments Nurse may pronounce      05/23/19 1925        Code Status History    Date Active Date Inactive Code Status Order ID Comments User Context   08/11/2018 2201 08/24/2018 2045 DNR [id number]  Bertrum SolSalary, Montell D, MD Inpatient   05/21/2017 2338 05/24/2017 2208 DNR [id number]  Oralia ManisWillis, David, MD Inpatient   Advance Care Planning Activity      TOTAL TIME TAKING CARE OF THIS PATIENT: 35 minutes.    Altamese DillingVaibhavkumar Marimar Suber M.D on 05/28/2019 at 2:39 PM  Between 7am to 6pm - Pager - [phone]  After 6pm go to [email or web address] - Social research officer, governmentpassword [email or web address]  [facility]  Office  [phone]  CC: Primary care physician; [name], Marshall W, MD   Note: This dictation was prepared with Dragon dictation along with smaller phrase technology. Any transcriptional errors that result from this process are unintentional.

## 2019-05-28 NOTE — Progress Notes (Signed)
Physical Therapy Treatment Patient Details Name: [name] MRN: [id number] DOB: December 03, 1931 Today's Date: 05/28/2019    History of Present Illness [name] is an 85 y.o. female who was admitted to the hospital with medical diagnosis of acute respitory distress with hypoxia, fluid overload, and UTI after being brought to the ED on 05/23/2019 from [facility] by EMS due to 4 days decline in strength and mentation. She was hypoxic upon arrival and ECK showed R BBB. Chest x-Sybilla Malhotra showed vascular congestion and urinalisis showed infection. She is intermittantly confused and could not provide reliable history at initial eval. Relevant PMH includes dementia, neuropathy, DM, HTN, anxiety, vertigo, myocytic anemia, GERD, Hx of apsiration pneumonia, hip fracture, and back sx for tumor removal.    PT Comments    Pt is making good progress towards goals with improved ambulation distance this date. Pt appears to be back to baseline cognition level. Safe technique/use of RW. Eager to discharge today back home.   Follow Up Recommendations  Supervision/Assistance - 24 hour(return back to ALF with HHPT)     Equipment Recommendations  Rolling walker with 5" wheels;3in1 (PT)    Recommendations for Other Services       Precautions / Restrictions Precautions Precautions: Fall Precaution Comments: intermittantly confused Restrictions Weight Bearing Restrictions: No    Mobility  Bed Mobility Overal bed mobility: Modified Independent Bed Mobility: Supine to Sit     Supine to sit: Supervision     General bed mobility comments: safe technique. Once seated at EOB, able to sit with upright posture.  Transfers Overall transfer level: Needs assistance Equipment used: Rolling walker (2 wheeled) Transfers: Sit to/from Stand Sit to Stand: Min guard         General transfer comment: Safe technique. Once standing, forward head posture. RW used for  safety  Ambulation/Gait Ambulation/Gait assistance: Min Conservator, museum/gallery (Feet): 120 Feet Assistive device: Rolling walker (2 wheeled) Gait Pattern/deviations: Step-through pattern;Decreased step length - right;Decreased step length - left     General Gait Details: ambulated using RW and safe technique. Follows directions well. Fatigues with increased distance.    Stairs             Wheelchair Mobility    Modified Rankin (Stroke Patients Only)       Balance Overall balance assessment: Needs assistance Sitting-balance support: Feet supported Sitting balance-Leahy Scale: Good     Standing balance support: Bilateral upper extremity supported Standing balance-Leahy Scale: Fair                              Cognition Arousal/Alertness: Awake/alert Behavior During Therapy: WFL for tasks assessed/performed Overall Cognitive Status: Within Functional Limits for tasks assessed                                        Exercises Other Exercises Other Exercises: pt reports she is anxious to leave, deferred ther-ex this date    General Comments        Pertinent Vitals/Pain Pain Assessment: No/denies pain    Home Living                      Prior Function            PT Goals (current goals can now be found in the care plan section) Acute Rehab  PT Goals Patient Stated Goal: to go home today PT Goal Formulation: With patient Time For Goal Achievement: 06/07/19 Potential to Achieve Goals: Fair Progress towards PT goals: Progressing toward goals    Frequency    Min 2X/week      PT Plan Current plan remains appropriate    Co-evaluation              AM-PAC PT "6 Clicks" Mobility   Outcome Measure  Help needed turning from your back to your side while in a flat bed without using bedrails?: None Help needed moving from lying on your back to sitting on the side of a flat bed without using bedrails?: None Help  needed moving to and from a bed to a chair (including a wheelchair)?: A Little Help needed standing up from a chair using your arms (e.g., wheelchair or bedside chair)?: A Little Help needed to walk in hospital room?: A Little Help needed climbing 3-5 steps with a railing? : A Lot 6 Click Score: 19    End of Session Equipment Utilized During Treatment: Gait belt Activity Tolerance: Patient tolerated treatment well;Other (comment) Patient left: in bed;with bed alarm set(seated at EOB) Nurse Communication: Mobility status PT Visit Diagnosis: Unsteadiness on feet (R26.81);Muscle weakness (generalized) (M62.81);Difficulty in walking, not elsewhere classified (R26.2)     Time: 1343-1400 PT Time Calculation (min) (ACUTE ONLY): 17 min  Charges:  $Gait Training: 8-22 mins                     Elizabeth PalauStephanie Jasmin [place]umbull, [place] CarolinaPT, DPT [phone]    Carlas Vandyne 05/28/2019, 2:54 PM

## 2019-06-06 ENCOUNTER — Emergency Department: Payer: Medicare Other

## 2019-06-06 ENCOUNTER — Encounter: Payer: Self-pay | Admitting: *Deleted

## 2019-06-06 ENCOUNTER — Emergency Department
Admission: EM | Admit: 2019-06-06 | Discharge: 2019-06-07 | Disposition: A | Discharge status: Home / Self Care | Payer: Medicare Other | Source: Home / Self Care | Attending: Emergency Medicine | Admitting: Emergency Medicine

## 2019-06-06 ENCOUNTER — Other Ambulatory Visit: Payer: Self-pay

## 2019-06-06 ENCOUNTER — Emergency Department
Admission: EM | Admit: 2019-06-06 | Discharge: 2019-06-06 | Disposition: A | Discharge status: Skilled Nursing Facility | Payer: Medicare Other | Attending: Emergency Medicine | Admitting: Emergency Medicine

## 2019-06-06 DIAGNOSIS — Z794 Long term (current) use of insulin: Secondary | ICD-10-CM | POA: Insufficient documentation

## 2019-06-06 DIAGNOSIS — G4730 Sleep apnea, unspecified: Secondary | ICD-10-CM | POA: Insufficient documentation

## 2019-06-06 DIAGNOSIS — R0602 Shortness of breath: Secondary | ICD-10-CM | POA: Diagnosis not present

## 2019-06-06 DIAGNOSIS — Z7982 Long term (current) use of aspirin: Secondary | ICD-10-CM | POA: Insufficient documentation

## 2019-06-06 DIAGNOSIS — R410 Disorientation, unspecified: Secondary | ICD-10-CM

## 2019-06-06 DIAGNOSIS — R262 Difficulty in walking, not elsewhere classified: Secondary | ICD-10-CM | POA: Insufficient documentation

## 2019-06-06 DIAGNOSIS — Z79899 Other long term (current) drug therapy: Secondary | ICD-10-CM | POA: Diagnosis not present

## 2019-06-06 DIAGNOSIS — R531 Weakness: Secondary | ICD-10-CM | POA: Insufficient documentation

## 2019-06-06 DIAGNOSIS — E119 Type 2 diabetes mellitus without complications: Secondary | ICD-10-CM | POA: Insufficient documentation

## 2019-06-06 DIAGNOSIS — R4182 Altered mental status, unspecified: Secondary | ICD-10-CM | POA: Diagnosis present

## 2019-06-06 DIAGNOSIS — F0390 Unspecified dementia without behavioral disturbance: Secondary | ICD-10-CM | POA: Insufficient documentation

## 2019-06-06 LAB — COMPREHENSIVE METABOLIC PANEL
ALT: 20 U/L (ref 0–44)
AST: 20 U/L (ref 15–41)
Albumin: 3.3 g/dL — ABNORMAL LOW (ref 3.5–5.0)
Alkaline Phosphatase: 73 U/L (ref 38–126)
Anion gap: 6 (ref 5–15)
BUN: 21 mg/dL (ref 8–23)
CO2: 28 mmol/L (ref 22–32)
Calcium: 9.1 mg/dL (ref 8.9–10.3)
Chloride: 111 mmol/L (ref 98–111)
Creatinine, Ser: 0.76 mg/dL (ref 0.44–1.00)
GFR calc Af Amer: >60 mL/min (ref >60)
GFR calc non Af Amer: >60 mL/min (ref >60)
Glucose, Bld: 191 mg/dL — ABNORMAL HIGH (ref 70–99)
Potassium: 4.5 mmol/L (ref 3.5–5.1)
Sodium: 145 mmol/L (ref 135–145)
Total Bilirubin: 0.4 mg/dL (ref 0.3–1.2)
Total Protein: 7.4 g/dL (ref 6.5–8.1)

## 2019-06-06 LAB — CBC WITH DIFFERENTIAL/PLATELET
Abs Immature Granulocytes: 0.01 10*3/uL (ref 0.00–0.07)
Basophils Absolute: 0 10*3/uL (ref 0.0–0.1)
Basophils Relative: 1 %
Eosinophils Absolute: 0.1 10*3/uL (ref 0.0–0.5)
Eosinophils Relative: 1 %
HCT: 35.7 % — ABNORMAL LOW (ref 36.0–46.0)
Hemoglobin: 10.5 g/dL — ABNORMAL LOW (ref 12.0–15.0)
Immature Granulocytes: 0 %
Lymphocytes Relative: 14 %
Lymphs Abs: 0.9 10*3/uL (ref 0.7–4.0)
MCH: 24.8 pg — ABNORMAL LOW (ref 26.0–34.0)
MCHC: 29.4 g/dL — ABNORMAL LOW (ref 30.0–36.0)
MCV: 84.4 fL (ref 80.0–100.0)
Monocytes Absolute: 0.4 10*3/uL (ref 0.1–1.0)
Monocytes Relative: 6 %
Neutro Abs: 5.2 10*3/uL (ref 1.7–7.7)
Neutrophils Relative %: 78 %
Platelets: 244 10*3/uL (ref 150–400)
RBC: 4.23 MIL/uL (ref 3.87–5.11)
RDW: 13.9 % (ref 11.5–15.5)
WBC: 6.7 10*3/uL (ref 4.0–10.5)
nRBC: 0 % (ref 0.0–0.2)

## 2019-06-06 LAB — URINALYSIS, ROUTINE W REFLEX MICROSCOPIC
Bilirubin Urine: NEGATIVE
Glucose, UA: NEGATIVE mg/dL
Hgb urine dipstick: NEGATIVE
Ketones, ur: NEGATIVE mg/dL
Leukocytes,Ua: NEGATIVE
Nitrite: NEGATIVE
Protein, ur: 30 mg/dL — AB
Specific Gravity, Urine: 1.013 (ref 1.005–1.030)
pH: 6 (ref 5.0–8.0)

## 2019-06-06 LAB — BLOOD GAS, VENOUS
Acid-Base Excess: 6.1 mmol/L — ABNORMAL HIGH (ref 0.0–2.0)
Bicarbonate: 31.8 mmol/L — ABNORMAL HIGH (ref 20.0–28.0)
O2 Saturation: 18 %
Patient temperature: 37
pCO2, Ven: 49 mmHg (ref 44.0–60.0)
pH, Ven: 7.42 (ref 7.250–7.430)
pO2, Ven: <31 mmHg — CL (ref 32.0–45.0)

## 2019-06-06 LAB — PROTIME-INR
INR: 1.1 (ref 0.8–1.2)
Prothrombin Time: 13.7 seconds (ref 11.4–15.2)

## 2019-06-06 LAB — AMMONIA: Ammonia: 16 umol/L (ref 9–35)

## 2019-06-06 LAB — APTT: aPTT: 31 seconds (ref 24–36)

## 2019-06-06 LAB — LACTIC ACID, PLASMA: Lactic Acid, Venous: 1.6 mmol/L (ref 0.5–1.9)

## 2019-06-06 MED ORDER — IOHEXOL 350 MG/ML SOLN
75.0000 mL | Freq: Once | INTRAVENOUS | Status: AC | PRN
  Administered 2019-06-06: 75 mL via INTRAVENOUS

## 2019-06-06 MED ORDER — SODIUM CHLORIDE 0.9 % IV SOLN
1.0000 g | Freq: Once | INTRAVENOUS | Status: AC
  Administered 2019-06-06: 09:00:00 1 g via INTRAVENOUS
  Filled 2019-06-06: qty 1

## 2019-06-06 NOTE — ED Notes (Signed)
Patient transported to CT 

## 2019-06-06 NOTE — Progress Notes (Signed)
PHARMACY -  BRIEF ANTIBIOTIC NOTE   Pharmacy has received consult(s) for meropenem from an ED provider.  The patient's profile has been reviewed for ht/wt/allergies/indication/available labs.    One time order(s) placed for meropenem 1g IV x 1  Further antibiotics/pharmacy consults should be ordered by admitting physician if indicated.                       Thank you,  Tobie Lords, PharmD, BCPS Clinical Pharmacist 06/06/2019  6:51 AM

## 2019-06-06 NOTE — ED Provider Notes (Signed)
[facility] Emergency Department Provider Note  ____________________________________________   I have reviewed the triage vital signs and the nursing notes.   HISTORY  Chief Complaint Altered Mental Status   History limited by confusion.   HPI [name] is a 85 y.o. female who presents to the emergency department today because of concern for confusion. History obtained from both patient and son. Patient was seen in the emergency department earlier today because of concern for hypoxia that was noted early this morning. Work up this morning without concerning findings and no hypoxia. Since going back home she has had confusion and difficulty with ambulation.    Records reviewed. Per medical record review patient has a history of dementia, sepsis  Past Medical History:  Diagnosis Date  . Anxiety   . Depression   . Diabetes mellitus without complication (HCC)   . GERD (gastroesophageal reflux disease)   . Hip fracture (HCC)   . Hyperlipemia   . Neuropathy   . Vertigo     Patient Active Problem List   Diagnosis Date Noted  . Hypoxia 05/23/2019  . Acute delirium 08/16/2018  . Dementia with behavioral disturbance (HCC) 08/16/2018  . Encephalopathy acute 08/11/2018  . Sepsis (HCC) 05/21/2017  . Aspiration pneumonia (HCC) 05/21/2017  . GERD (gastroesophageal reflux disease) 05/21/2017  . HLD (hyperlipidemia) 05/21/2017  . Depression 05/21/2017  . Anxiety 05/21/2017  . Elevated troponin 05/21/2017  . Diabetes (HCC) 05/21/2017  . Right humeral fracture 05/21/2017  . Nausea and vomiting 03/31/2015  . Microcytic anemia 03/31/2015    Past Surgical History:  Procedure Laterality Date  . ABDOMINAL HYSTERECTOMY    . APPENDECTOMY    . BACK SURGERY     tumor removal bengin  . CHOLECYSTECTOMY      Prior to Admission medications   Medication Sig Start Date End Date Taking? Authorizing Provider  acetaminophen (TYLENOL) 325 MG tablet Take 650 mg by  mouth every 4 (four) hours as needed for mild pain or moderate pain.     [provider]  alum & mag hydroxide-simeth (MAALOX/MYLANTA) 200-200-20 MG/5ML suspension Take 30 mLs by mouth as needed for indigestion or heartburn.    [provider]  amLODipine (NORVASC) 5 MG tablet Take 5 mg by mouth daily.    [provider]  aspirin EC 81 MG tablet Take 81 mg by mouth daily.    [provider]  bismuth subsalicylate (PEPTO BISMOL) 262 MG chewable tablet Chew 262 mg by mouth as needed.    [provider]  calcium carbonate (TUMS - DOSED IN MG ELEMENTAL CALCIUM) 500 MG chewable tablet Chew 2 tablets (400 mg of elemental calcium total) by mouth 3 (three) times daily as needed for indigestion or heartburn. 08/23/18   Alford HighlandWieting, Richard, MD  cetirizine (ZYRTEC) 10 MG tablet Take 10 mg by mouth daily.    [provider]  dicyclomine (BENTYL) 20 MG tablet Take 20 mg by mouth 4 (four) times daily.    [provider]  divalproex (DEPAKOTE ER) 250 MG 24 hr tablet Take 250 mg by mouth at bedtime.    [provider]  esomeprazole (NEXIUM) 20 MG capsule Take 20 mg by mouth daily.     [provider]  fluticasone (FLONASE) 50 MCG/ACT nasal spray Place 1 spray into both nostrils daily.     [provider]  furosemide (LASIX) 20 MG tablet Take 1 tablet (20 mg total) by mouth daily. 05/29/19   Altamese DillingVachhani, Vaibhavkumar, MD  insulin aspart (NOVOLOG) 100 UNIT/ML injection 3 units with each meal if she eats Patient taking differently: Ss 201-250= 2u; 251-300= 4u; 301-350= 6u; 351-400= 10u; notify md if >70 or <400 08/23/18   Wieting, Richard, MD  ipratropium-albuterol (DUONEB) 0.5-2.5 (3) MG/3ML SOLN Take 3 mLs by nebulization 2 (two) times daily. 08/23/18   Loletha Grayer, MD  ketotifen (ZADITOR) 0.025 % ophthalmic solution Place 1 drop into both eyes 2 (two) times daily.     [provider]  loperamide (IMODIUM) 2 MG capsule  Take 2 mg by mouth daily as needed for diarrhea or loose stools.     [provider]  LORazepam (ATIVAN) 0.5 MG tablet Take 0.5 mg by mouth every 8 (eight) hours.    [provider]  magnesium hydroxide (MILK OF MAGNESIA) 400 MG/5ML suspension Take 30 mLs by mouth daily as needed for mild constipation.    [provider]  metFORMIN (GLUCOPHAGE-XR) 500 MG 24 hr tablet Take 500 mg by mouth 2 (two) times a day.    [provider]  metoprolol tartrate (LOPRESSOR) 25 MG tablet Take 1 tablet (25 mg total) by mouth 2 (two) times daily. 08/23/18   Loletha Grayer, MD  Multiple Vitamin (MULTIVITAMIN WITH MINERALS) TABS tablet Take 1 tablet by mouth daily. 08/23/18   Loletha Grayer, MD  olopatadine (PATANOL) 0.1 % ophthalmic solution Place 1 drop into both eyes daily.    [provider]  ondansetron (ZOFRAN) 4 MG tablet Take 4 mg by mouth every 8 (eight) hours as needed for nausea or vomiting.    [provider]  polyethylene glycol (MIRALAX / GLYCOLAX) 17 g packet Take 17 g by mouth daily as needed for mild constipation. 05/28/19   Vaughan Basta, MD  psyllium (METAMUCIL) 58.6 % powder Take 1 packet by mouth 2 (two) times a day.    [provider]  QUEtiapine (SEROQUEL) 25 MG tablet Take 1 tablet (25 mg total) by mouth at bedtime as needed (psychosis, insomnia). Patient taking differently: Take 12.5 mg by mouth daily.  08/23/18   Loletha Grayer, MD  sodium phosphate Pediatric (FLEET) 3.5-9.5 GM/59ML enema Place 1 enema rectally once as needed for severe constipation.    [provider]  telmisartan (MICARDIS) 40 MG tablet Take 40 mg by mouth daily.     [provider]    Allergies Ampicillin and Sulfa antibiotics  Family History  Problem Relation Age of Onset  . Diabetes Mother   . Diabetes Sister        x2  . Breast cancer Sister   . Heart disease Father   . Heart disease Brother        x3  . Bladder  Cancer Neg Hx   . Kidney cancer Neg Hx   . Prostate cancer Neg Hx     Social History Social History   Tobacco Use  . Smoking status: Never Smoker  . Smokeless tobacco: Never Used  Substance Use Topics  . Alcohol use: No    Alcohol/week: 0.0 standard drinks  . Drug use: No    Review of Systems Constitutional: No fever/chills Eyes: No visual changes. ENT: No sore throat. Cardiovascular: Denies chest pain. Respiratory: Denies shortness of breath. Gastrointestinal: No abdominal pain.  No nausea, no vomiting.  No diarrhea.   Genitourinary: Negative for dysuria. Musculoskeletal: Negative for back pain. Skin: Negative for rash. Neurological: Positive for increased confusion. Positive for lower extremity weakness.  ____________________________________________   PHYSICAL EXAM:  VITAL SIGNS: ED Triage  Vitals  Enc Vitals Group     BP 06/06/19 1612 129/71     Pulse Rate 06/06/19 1612 91     Resp 06/06/19 1612 16     Temp 06/06/19 1612 98.3 F (36.8 C)     Temp Source 06/06/19 1612 Oral     SpO2 06/06/19 1612 96 %     Weight 06/06/19 1616 176 lb 5.9 oz (80 kg)     Height 06/06/19 1616 5\' 5"  (1.651 m)     Head Circumference --      Peak Flow --      Pain Score 06/06/19 1616 0   Constitutional: Awake and alert. Not completely oriented.  Eyes: Conjunctivae are normal.  ENT      Head: Normocephalic and atraumatic.      Nose: No congestion/rhinnorhea.      Mouth/Throat: Mucous membranes are moist.      Neck: No stridor. Hematological/Lymphatic/Immunilogical: No cervical lymphadenopathy. Cardiovascular: Normal rate, regular rhythm.  No murmurs, rubs, or gallops.  Respiratory: Normal respiratory effort without tachypnea nor retractions. Breath sounds are clear and equal bilaterally. No wheezes/rales/rhonchi. Gastrointestinal: Soft and non tender. No rebound. No guarding.  Genitourinary: Deferred Musculoskeletal: Normal range of motion in all extremities. No lower extremity  edema. Neurologic:  Not completely oriented. Moving all extremities. Strength equal on left and right sides.  Skin:  Skin is warm, dry and intact. No rash noted.  ____________________________________________    LABS (pertinent positives/negatives)  Ammonia 16 PCO2 49  ____________________________________________   EKG  None  ____________________________________________    RADIOLOGY  CT head No acute intracranial pathology  MR brain No acute stroke ____________________________________________   PROCEDURES  Procedures  ____________________________________________   INITIAL IMPRESSION / ASSESSMENT AND PLAN / ED COURSE  Pertinent labs & imaging results that were available during my care of the patient were reviewed by me and considered in my medical decision making (see chart for details).   Patient presented to the emergency department today because of concerns for confusion and difficulty with ambulation.  I did review blood work and work-up done earlier today.  Added on ammonia and VBG to evaluate carbon dioxide levels.  These were both normal.  Did get brain imaging which did not show any acute stroke.  During her course of her stay the patient seemed to improve.  She was able to ambulate with some assistance and does not use a walker at home.  Discussed with son.  At this point does feel comfortable with patient going home.  Does think some of it might of been due to patient not eating earlier today with the earlier emergency department visit.   ____________________________________________   FINAL CLINICAL IMPRESSION(S) / ED DIAGNOSES  Final diagnoses:  Confusion  Weakness     Note: This dictation was prepared with Dragon dictation. Any transcriptional errors that result from this process are unintentional     Phineas SemenGoodman, Yarielis Funaro, MD 06/06/19 [phone]

## 2019-06-06 NOTE — ED Notes (Signed)
Attempted [facility] facility again and couldn't get anyone but a voicemail box, left message informing them I was attempting to give report

## 2019-06-06 NOTE — ED Notes (Signed)
Attempted to call son to see how patient is getting back to the nursing facility, left voicemail. Also called [facility] to give report and voicemail states their office is closed after 5:00, no option to speak with anyone in person

## 2019-06-06 NOTE — ED Triage Notes (Signed)
First RN Note: pt presents to ED via [facility] with c/o AMS per [facility] where patient is from. Per EMS staff at [facility] report pt is not at her baseline, pt discharged from hospital aprox 3 hrs ago. EMS also reports pt's son requesting full and complete work up of patient prior to her discharge from hospital.

## 2019-06-06 NOTE — ED Notes (Signed)
Pt resting in bed with eyes closed, rise and fall of chest noted.  

## 2019-06-06 NOTE — ED Provider Notes (Signed)
[facility] Emergency Department Provider Note  ____________________________________________  Time seen: Approximately 12:20 PM  I have reviewed the triage vital signs and the nursing notes.   HISTORY  Chief Complaint Altered Mental Status  Level 5 Caveat: Portions of the History and Physical including HPI and review of systems are unable to be completely obtained due to patient being a poor historian    HPI [name] is a 85 y.o. female with a history of diabetes anxiety and hyperlipidemia, and dementia, who is sent to the ED today due to low oxygen level while sleeping.  Patient just reports feeling tired.  She denies any acute pain or other symptoms.  Denies vomiting.   Initial oxygen saturation is 94% on room air   Past Medical History:  Diagnosis Date  . Anxiety   . Depression   . Diabetes mellitus without complication ([place])   . GERD (gastroesophageal reflux disease)   . Hip fracture ([place])   . Hyperlipemia   . Neuropathy   . Vertigo      Patient Active Problem List   Diagnosis Date Noted  . Hypoxia 05/23/2019  . Acute delirium 08/16/2018  . Dementia with behavioral disturbance ([place]) 08/16/2018  . Encephalopathy acute 08/11/2018  . Sepsis ([place]) 05/21/2017  . Aspiration pneumonia ([place]) 05/21/2017  . GERD (gastroesophageal reflux disease) 05/21/2017  . HLD (hyperlipidemia) 05/21/2017  . Depression 05/21/2017  . Anxiety 05/21/2017  . Elevated troponin 05/21/2017  . Diabetes ([place]) 05/21/2017  . Right humeral fracture 05/21/2017  . Nausea and vomiting 03/31/2015  . Microcytic anemia 03/31/2015     Past Surgical History:  Procedure Laterality Date  . ABDOMINAL HYSTERECTOMY    . APPENDECTOMY    . BACK SURGERY     tumor removal bengin  . CHOLECYSTECTOMY       Prior to Admission medications   Medication Sig Start Date End Date Taking? Authorizing Provider  amLODipine (NORVASC) 5 MG tablet Take 5 mg by mouth daily.   Yes  [provider]  aspirin EC 81 MG tablet Take 81 mg by mouth daily.   Yes [provider]  calcium carbonate (TUMS - DOSED IN MG ELEMENTAL CALCIUM) 500 MG chewable tablet Chew 2 tablets (400 mg of elemental calcium total) by mouth 3 (three) times daily as needed for indigestion or heartburn. 08/23/18  Yes Wieting, Richard, MD  cetirizine (ZYRTEC) 10 MG tablet Take 10 mg by mouth daily.   Yes [provider]  dicyclomine (BENTYL) 20 MG tablet Take 20 mg by mouth 4 (four) times daily.   Yes [provider]  divalproex (DEPAKOTE ER) 250 MG 24 hr tablet Take 250 mg by mouth at bedtime.   Yes [provider]  esomeprazole (NEXIUM) 20 MG capsule Take 20 mg by mouth daily.    Yes [provider]  fluticasone (FLONASE) 50 MCG/ACT nasal spray Place 1 spray into both nostrils daily.    Yes [provider]  furosemide (LASIX) 20 MG tablet Take 1 tablet (20 mg total) by mouth daily. 05/29/19  Yes Vaughan Basta, MD  insulin aspart (NOVOLOG) 100 UNIT/ML injection 3 units with each meal if she eats Patient taking differently: Ss 201-250= 2u; 251-300= 4u; 301-350= 6u; 351-400= 10u; notify md if >70 or <400 08/23/18  Yes Wieting, Richard, MD  ipratropium-albuterol (DUONEB) 0.5-2.5 (3) MG/3ML SOLN Take 3 mLs by nebulization 2 (two) times daily. 08/23/18  Yes Wieting, Richard, MD  ketotifen (ZADITOR) 0.025 % ophthalmic solution Place 1 drop  into both eyes 2 (two) times daily.    Yes [provider]  LORazepam (ATIVAN) 0.5 MG tablet Take 0.5 mg by mouth every 8 (eight) hours.   Yes [provider]  metFORMIN (GLUCOPHAGE-XR) 500 MG 24 hr tablet Take 500 mg by mouth 2 (two) times a day.   Yes [provider]  metoprolol tartrate (LOPRESSOR) 25 MG tablet Take 1 tablet (25 mg total) by mouth 2 (two) times daily. 08/23/18  Yes Wieting, Richard, MD  Multiple Vitamin (MULTIVITAMIN WITH MINERALS) TABS tablet Take 1 tablet by mouth  daily. 08/23/18  Yes Wieting, Richard, MD  olopatadine (PATANOL) 0.1 % ophthalmic solution Place 1 drop into both eyes daily.   Yes [provider]  polyethylene glycol (MIRALAX / GLYCOLAX) 17 g packet Take 17 g by mouth daily as needed for mild constipation. 05/28/19  Yes Altamese Dilling, MD  QUEtiapine (SEROQUEL) 25 MG tablet Take 1 tablet (25 mg total) by mouth at bedtime as needed (psychosis, insomnia). Patient taking differently: Take 12.5 mg by mouth daily.  08/23/18  Yes Wieting, Richard, MD  telmisartan (MICARDIS) 40 MG tablet Take 40 mg by mouth daily.    Yes [provider]  acetaminophen (TYLENOL) 325 MG tablet Take 650 mg by mouth every 4 (four) hours as needed for mild pain or moderate pain.     [provider]  alum & mag hydroxide-simeth (MAALOX/MYLANTA) 200-200-20 MG/5ML suspension Take 30 mLs by mouth as needed for indigestion or heartburn.    [provider]  bismuth subsalicylate (PEPTO BISMOL) 262 MG chewable tablet Chew 262 mg by mouth as needed.    [provider]  loperamide (IMODIUM) 2 MG capsule Take 2 mg by mouth daily as needed for diarrhea or loose stools.     [provider]  magnesium hydroxide (MILK OF MAGNESIA) 400 MG/5ML suspension Take 30 mLs by mouth daily as needed for mild constipation.    [provider]  ondansetron (ZOFRAN) 4 MG tablet Take 4 mg by mouth every 8 (eight) hours as needed for nausea or vomiting.    [provider]  psyllium (METAMUCIL) 58.6 % powder Take 1 packet by mouth 2 (two) times a day.    [provider]  sodium phosphate Pediatric (FLEET) 3.5-9.5 GM/59ML enema Place 1 enema rectally once as needed for severe constipation.    [provider]     Allergies Ampicillin and Sulfa antibiotics   Family History  Problem Relation Age of Onset  . Diabetes Mother   . Diabetes Sister        x2  . Breast cancer Sister   . Heart disease Father   .  Heart disease Brother        x3  . Bladder Cancer Neg Hx   . Kidney cancer Neg Hx   . Prostate cancer Neg Hx     Social History Social History   Tobacco Use  . Smoking status: Never Smoker  . Smokeless tobacco: Never Used  Substance Use Topics  . Alcohol use: No    Alcohol/week: 0.0 standard drinks  . Drug use: No    Review of Systems  Constitutional:   No fever or chills.  ENT:   No sore throat. No rhinorrhea. Cardiovascular:   No chest pain or syncope. Respiratory:   No dyspnea or cough. Gastrointestinal:   Negative for abdominal pain, vomiting and diarrhea.  Musculoskeletal:   Negative for focal pain or swelling All other systems reviewed and are  negative except as documented above in ROS and HPI.  ____________________________________________   PHYSICAL EXAM:  VITAL SIGNS: ED Triage Vitals  Enc Vitals Group     BP 06/06/19 0656 (!) 139/50     Pulse Rate 06/06/19 0656 96     Resp 06/06/19 0656 18     Temp 06/06/19 0656 98 F (36.7 C)     Temp Source 06/06/19 0656 Oral     SpO2 06/06/19 0656 93 %     Weight 06/06/19 0657 176 lb 5.9 oz (80 kg)     Height 06/06/19 0657 5\' 6"  (1.676 m)     Head Circumference --      Peak Flow --      Pain Score 06/06/19 0657 0     Pain Loc --      Pain Edu? --      Excl. in GC? --     Vital signs reviewed, nursing assessments reviewed.   Constitutional:   Alert and oriented to person and place. Non-toxic appearance. Eyes:   Conjunctivae are normal. EOMI. PERRL. ENT      Head:   Normocephalic and atraumatic.      Nose:   No congestion/rhinnorhea.       Mouth/Throat:   MMM, no pharyngeal erythema. No peritonsillar mass.       Neck:   No meningismus. Full ROM. Hematological/Lymphatic/Immunilogical:   No cervical lymphadenopathy. Cardiovascular:   RRR. Symmetric bilateral radial and DP pulses.  No murmurs. Cap refill less than 2 seconds. Respiratory:   Normal respiratory effort without tachypnea/retractions. Breath sounds  are clear and equal bilaterally. No wheezes/rales/rhonchi. Gastrointestinal:   Soft and nontender. Non distended. There is no CVA tenderness.  No rebound, rigidity, or guarding. Musculoskeletal:   Normal range of motion in all extremities. No joint effusions.  No lower extremity tenderness.  No edema. Neurologic:   Normal speech and language.  Motor grossly intact. No acute focal neurologic deficits are appreciated.  Skin:    Skin is warm, dry and intact. No rash noted.  No petechiae, purpura, or bullae.  ____________________________________________    LABS (pertinent positives/negatives) (all labs ordered are listed, but only abnormal results are displayed) Labs Reviewed  COMPREHENSIVE METABOLIC PANEL - Abnormal; Notable for the following components:      Result Value   Glucose, Bld 191 (*)    Albumin 3.3 (*)    All other components within normal limits  CBC WITH DIFFERENTIAL/PLATELET - Abnormal; Notable for the following components:   Hemoglobin 10.5 (*)    HCT 35.7 (*)    MCH 24.8 (*)    MCHC 29.4 (*)    All other components within normal limits  URINALYSIS, ROUTINE W REFLEX MICROSCOPIC - Abnormal; Notable for the following components:   Color, Urine YELLOW (*)    APPearance CLEAR (*)    Protein, ur 30 (*)    Bacteria, UA RARE (*)    All other components within normal limits  CULTURE, BLOOD (ROUTINE X 2)  CULTURE, BLOOD (ROUTINE X 2)  URINE CULTURE  LACTIC ACID, PLASMA  APTT  PROTIME-INR   ____________________________________________   EKG  Interpreted by me Sinus rhythm rate of 99, left axis, right bundle branch block.  LVH.  No acute ischemic changes.  ____________________________________________    RADIOLOGY  Ct Angio Chest Pe W And/or Wo Contrast  Result Date: 06/06/2019 CLINICAL DATA:  Snoring respirations today with shortness of breath. Somnolence. Clinical concern for sepsis and pulmonary embolism. EXAM: CT ANGIOGRAPHY CHEST  WITH CONTRAST TECHNIQUE:  Multidetector CT imaging of the chest was performed using the standard protocol during bolus administration of intravenous contrast. Multiplanar CT image reconstructions and MIPs were obtained to evaluate the vascular anatomy. CONTRAST:  75mL OMNIPAQUE IOHEXOL 350 MG/ML SOLN COMPARISON:  Chest radiograph same day. Noncontrast chest CT 08/11/2018. FINDINGS: Cardiovascular: The pulmonary arteries are suboptimally opacified with contrast to the level of the subsegmental branches. No pulmonary emboli are identified. There is extensive atherosclerosis of the aorta, great vessels and coronary arteries with mural thrombus and possible penetrating ulcers in the distal aortic arch and proximal descending thoracic aorta. No evidence of dissection or focal aneurysm. Stable cardiomegaly without significant pericardial effusion. Mediastinum/Nodes: There are no enlarged mediastinal, hilar or axillary lymph nodes.Scattered small mediastinal lymph nodes are stable and likely reactive. There is stable asymmetric enlargement of the left lobe of the thyroid and resulting tracheal deviation to the right. There is stable dilatation of the esophagus with retained ingested material. Lungs/Pleura: There is no pleural effusion or pneumothorax. There is stable chronic lung disease with bibasilar atelectasis or scarring, central airway thickening and mild underlying emphysema. No suspicious pulmonary nodule or confluent airspace opacity. Upper abdomen: The visualized upper abdomen appears stable without significant findings status post cholecystectomy. Musculoskeletal/Chest wall: There is no chest wall mass or suspicious osseous finding. There are stable degenerative changes throughout the spine. There are surgical clips in the right upper paraspinal region. Review of the MIP images confirms the above findings. IMPRESSION: 1. Although the study is suboptimal in quality, there is no evidence of acute pulmonary embolism or other acute chest  process. 2. Extensive coronary and Aortic Atherosclerosis (ICD10-I70.0). Atheromatous plaque and possible penetrating ulcers in the distal aortic arch and proximal descending thoracic aorta. 3. Mild chronic esophageal dilatation suggesting esophageal dysmotility. 4. Stable enlarged left lobe of the thyroid consistent with asymmetric goiter. 5. Stable chronic lung disease with central airway thickening, bibasilar atelectasis or scarring. Electronically Signed   By: Carey BullocksWilliam  Veazey M.D.   On: 06/06/2019 11:34   Dg Chest Port 1 View  Result Date: 06/06/2019 CLINICAL DATA:  Snoring respirations at 1 a.m. last night per staff at the patient's extended care facility. EXAM: PORTABLE CHEST 1 VIEW COMPARISON:  Single-view of the chest 05/23/2019 and 08/20/2018. CT chest 08/11/2018. FINDINGS: There is cardiomegaly and vascular congestion. No consolidative process, pneumothorax or effusion. Atherosclerosis noted. IMPRESSION: No acute disease. Cardiomegaly and vascular congestion. Electronically Signed   By: Drusilla Kannerhomas  Dalessio M.D.   On: 06/06/2019 07:36    ____________________________________________   PROCEDURES Procedures  ____________________________________________  DIFFERENTIAL DIAGNOSIS   Pneumonia, pulmonary edema, pulmonary embolism, UTI  CLINICAL IMPRESSION / ASSESSMENT AND PLAN / ED COURSE  Medications ordered in the ED: Medications  meropenem (MERREM) 1 g in sodium chloride 0.9 % 100 mL IVPB (0 g Intravenous Stopped 06/06/19 0905)  iohexol (OMNIPAQUE) 350 MG/ML injection 75 mL (75 mLs Intravenous Contrast Given 06/06/19 1049)    Pertinent labs & imaging results that were available during my care of the patient were reviewed by me and considered in my medical decision making (see chart for details).  Wyn Forsterva Nell Aschoff was evaluated in Emergency Department on 06/06/2019 for the symptoms described in the history of present illness. She was evaluated in the context of the global COVID-19 pandemic,  which necessitated consideration that the patient might be at risk for infection with the SARS-CoV-2 virus that causes COVID-19. Institutional protocols and algorithms that pertain to the evaluation of patients at risk for  COVID-19 are in a state of rapid change based on information released by regulatory bodies including the CDC and federal and state organizations. These policies and algorithms were followed during the patient's care in the ED.   Patient sent to the ED due to snoring respirations and potential low oxygen level.  When I perform oxygen saturation measurement myself, I get 92% on room air while asleep with a good waveform.  Other variable readings have been obtained, but she does not appear to have any profound hypoxia or CO2 narcosis.  Initial labs are all reassuring and without any evidence of sepsis.  UA is clean, chest x-ray unremarkable.  Due to her recent hospitalization, CT angiogram was performed which is negative for PE, subtle pulmonary edema, or any other acute pulmonary finding.  She appears to be at her chronic baseline and stable for discharge home for further management by primary care.  Doubt ACS dissection AAA pericarditis.  Presentation is consistent with a chronic sleep apnea.      ____________________________________________   FINAL CLINICAL IMPRESSION(S) / ED DIAGNOSES    Final diagnoses:  Sleep apnea, unspecified type  Dementia   ED Discharge Orders    None      Portions of this note were generated with dragon dictation software. Dictation errors may occur despite best attempts at proofreading.   Sharman CheekStafford, Nyeisha Goodall, MD 06/06/19 1225

## 2019-06-06 NOTE — Discharge Instructions (Addendum)
Your labs and CT scan of the chest today were all okay.  There is no sign of any acute lung disease causing the low oxygen levels while sleeping.  You should follow-up with case management and your primary care doctor who can arrange for home oxygen while sleeping as needed.

## 2019-06-06 NOTE — ED Notes (Signed)
Pt otf for imaging 

## 2019-06-06 NOTE — ED Notes (Signed)
Pt eating with son in lobby with her at this time. Pt continues to be confused but is alert.

## 2019-06-06 NOTE — ED Notes (Signed)
Assisted pt with getting to room 9 and hooked up to the monitor. Pt was changed and given a snack/drink.

## 2019-06-06 NOTE — ED Triage Notes (Signed)
Pt returning to ED after being discharged from ED earlier today. Son requesting a full workup and [facility] reporting pt is still not at her baseline. Pt requesting food in triage and stating, " I have not eaten and I am cold" Warm blanket given to patient.

## 2019-06-06 NOTE — Discharge Instructions (Addendum)
Please seek medical attention for any high fevers, chest pain, shortness of breath, change in behavior, persistent vomiting, bloody stool or any other new or concerning symptoms.  

## 2019-06-06 NOTE — Consult Note (Signed)
CODE SEPSIS - PHARMACY COMMUNICATION  **[facility] Spectrum Antibiotics should be administered within 1 hour of Sepsis diagnosis**  Time Code Sepsis Called/Page Received: 9476  Antibiotics Ordered: Meropenem  Time of 1st antibiotic administration: 0830  Additional action taken by pharmacy: Called nursing to f/u  If necessary, Name of Provider/Nurse Contacted: [place], PharmD, BCPS Clinical Pharmacist 06/06/2019 8:21 AM

## 2019-06-06 NOTE — ED Notes (Signed)
Secretary Juliann Pulse informed pt in need of ambulance to transport back to [facility]

## 2019-06-06 NOTE — ED Notes (Signed)
Pt ambulated to and from toilet in room while using walker. Pt used walker appropriately

## 2019-06-06 NOTE — ED Notes (Signed)
While sleeping O2 dropped to 84%. Patient placed on 1L O2 with sats of 98%.

## 2019-06-06 NOTE — ED Notes (Signed)
Pt back from triage and is A&Ox4. Per pt's son when she was discharged earlier she was still disoriented and asking about son's dad. Son reports since she ate in the waiting room she seems to be feeling better. NAD at this time. Son at bedside

## 2019-06-06 NOTE — ED Triage Notes (Signed)
Arrived via EMS from [place]. Per EMS staff reports snoring respirations at 1am and then this am the nursing supervisor said to send to ED for evaluation. On arrival pt sat 98% on 3L. O2 removed and pt at 94%. Pt is sleepy but answers question appropriately

## 2019-06-06 NOTE — ED Notes (Signed)
Pt returning from CT. NAD noted at this time.

## 2019-06-06 NOTE — ED Notes (Signed)
Attempted to have patient drink and eat. She refused saying she was tired and wanted to sleep. MD made aware

## 2019-06-07 LAB — URINE CULTURE

## 2019-06-07 NOTE — ED Notes (Signed)
EMS here to pick pt up 

## 2019-06-07 NOTE — ED Notes (Signed)
Pt unable to sign due to altered mental status 

## 2019-06-11 LAB — CULTURE, BLOOD (ROUTINE X 2)
Culture: NO GROWTH
Culture: NO GROWTH

## 2019-08-10 IMAGING — DX PORTABLE CHEST - 1 VIEW
1 series · 1 of 1 positions shown · non-contrast
Comparison: 08/20/2018

CLINICAL DATA: Short of breath

EXAM:
PORTABLE CHEST 1 VIEW

[chest ap]
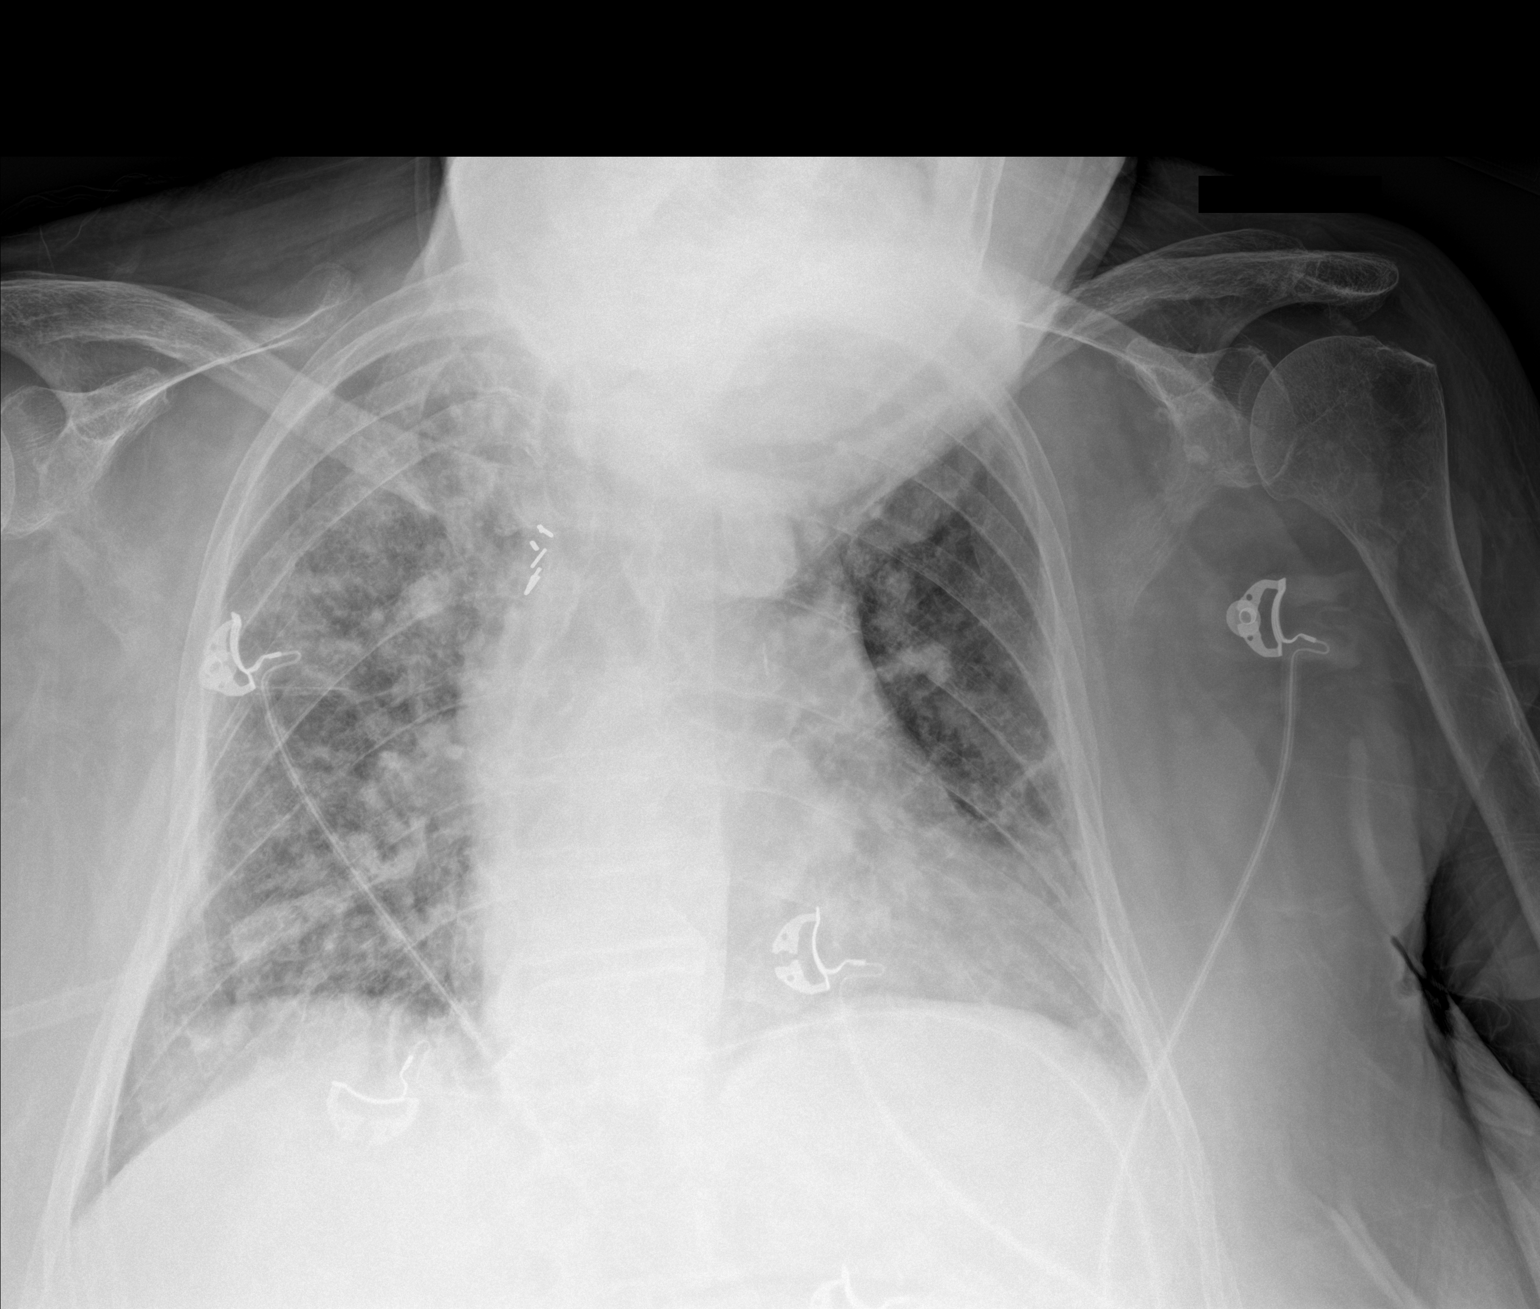

[1 of 1 positions shown; findings below may reference images not displayed]

FINDINGS: Cardiac enlargement with pulmonary vascular congestion. Overall
improved aeration compared to the prior study. No pleural effusion.
Surgical clips right paratracheal region.
IMPRESSION: Cardiac enlargement with mild vascular congestion. Negative for
edema or pneumonia.

## 2019-10-15 ENCOUNTER — Emergency Department: Payer: Medicare Other

## 2019-10-15 ENCOUNTER — Other Ambulatory Visit: Payer: Self-pay

## 2019-10-15 ENCOUNTER — Inpatient Hospital Stay (HOSPITAL_COMMUNITY)
Admission: AD | Admit: 2019-10-15 | Discharge: 2019-10-24 | DRG: 177 | Disposition: A | Discharge status: Skilled Nursing Facility | Payer: Medicare Other | Source: Other Acute Inpatient Hospital | Attending: Internal Medicine | Admitting: Internal Medicine

## 2019-10-15 ENCOUNTER — Emergency Department
Admission: EM | Admit: 2019-10-15 | Discharge: 2019-10-15 | Disposition: A | Discharge status: Other Acute Inpatient Hospital | Payer: Medicare Other | Attending: Emergency Medicine | Admitting: Emergency Medicine

## 2019-10-15 ENCOUNTER — Telehealth: Payer: Self-pay

## 2019-10-15 DIAGNOSIS — Z20822 Contact with and (suspected) exposure to covid-19: Secondary | ICD-10-CM | POA: Diagnosis present

## 2019-10-15 DIAGNOSIS — J449 Chronic obstructive pulmonary disease, unspecified: Secondary | ICD-10-CM | POA: Insufficient documentation

## 2019-10-15 DIAGNOSIS — F329 Major depressive disorder, single episode, unspecified: Secondary | ICD-10-CM | POA: Diagnosis present

## 2019-10-15 DIAGNOSIS — U071 COVID-19: Principal | ICD-10-CM | POA: Diagnosis present

## 2019-10-15 DIAGNOSIS — E875 Hyperkalemia: Secondary | ICD-10-CM | POA: Diagnosis present

## 2019-10-15 DIAGNOSIS — J1282 Pneumonia due to coronavirus disease 2019: Secondary | ICD-10-CM

## 2019-10-15 DIAGNOSIS — Z79899 Other long term (current) drug therapy: Secondary | ICD-10-CM | POA: Diagnosis not present

## 2019-10-15 DIAGNOSIS — Z794 Long term (current) use of insulin: Secondary | ICD-10-CM | POA: Diagnosis not present

## 2019-10-15 DIAGNOSIS — K219 Gastro-esophageal reflux disease without esophagitis: Secondary | ICD-10-CM | POA: Diagnosis present

## 2019-10-15 DIAGNOSIS — F01518 Vascular dementia, unspecified severity, with other behavioral disturbance: Secondary | ICD-10-CM

## 2019-10-15 DIAGNOSIS — J159 Unspecified bacterial pneumonia: Secondary | ICD-10-CM | POA: Diagnosis present

## 2019-10-15 DIAGNOSIS — E872 Acidosis, unspecified: Secondary | ICD-10-CM

## 2019-10-15 DIAGNOSIS — F419 Anxiety disorder, unspecified: Secondary | ICD-10-CM | POA: Diagnosis present

## 2019-10-15 DIAGNOSIS — J440 Chronic obstructive pulmonary disease with acute lower respiratory infection: Secondary | ICD-10-CM | POA: Diagnosis present

## 2019-10-15 DIAGNOSIS — F32A Depression, unspecified: Secondary | ICD-10-CM | POA: Diagnosis present

## 2019-10-15 DIAGNOSIS — I10 Essential (primary) hypertension: Secondary | ICD-10-CM | POA: Diagnosis not present

## 2019-10-15 DIAGNOSIS — Z66 Do not resuscitate: Secondary | ICD-10-CM | POA: Diagnosis present

## 2019-10-15 DIAGNOSIS — Z7982 Long term (current) use of aspirin: Secondary | ICD-10-CM

## 2019-10-15 DIAGNOSIS — F0150 Vascular dementia without behavioral disturbance: Secondary | ICD-10-CM | POA: Diagnosis present

## 2019-10-15 DIAGNOSIS — D61818 Other pancytopenia: Secondary | ICD-10-CM | POA: Diagnosis present

## 2019-10-15 DIAGNOSIS — F0151 Vascular dementia with behavioral disturbance: Secondary | ICD-10-CM | POA: Insufficient documentation

## 2019-10-15 DIAGNOSIS — J9621 Acute and chronic respiratory failure with hypoxia: Secondary | ICD-10-CM | POA: Diagnosis present

## 2019-10-15 DIAGNOSIS — E785 Hyperlipidemia, unspecified: Secondary | ICD-10-CM | POA: Diagnosis present

## 2019-10-15 DIAGNOSIS — E1169 Type 2 diabetes mellitus with other specified complication: Secondary | ICD-10-CM

## 2019-10-15 DIAGNOSIS — J1289 Other viral pneumonia: Secondary | ICD-10-CM | POA: Diagnosis present

## 2019-10-15 DIAGNOSIS — R0602 Shortness of breath: Secondary | ICD-10-CM | POA: Diagnosis present

## 2019-10-15 DIAGNOSIS — E119 Type 2 diabetes mellitus without complications: Secondary | ICD-10-CM | POA: Insufficient documentation

## 2019-10-15 DIAGNOSIS — I5032 Chronic diastolic (congestive) heart failure: Secondary | ICD-10-CM | POA: Diagnosis present

## 2019-10-15 DIAGNOSIS — R7989 Other specified abnormal findings of blood chemistry: Secondary | ICD-10-CM

## 2019-10-15 DIAGNOSIS — I119 Hypertensive heart disease without heart failure: Secondary | ICD-10-CM | POA: Diagnosis present

## 2019-10-15 DIAGNOSIS — E1165 Type 2 diabetes mellitus with hyperglycemia: Secondary | ICD-10-CM | POA: Diagnosis present

## 2019-10-15 DIAGNOSIS — N183 Chronic kidney disease, stage 3 unspecified: Secondary | ICD-10-CM | POA: Diagnosis present

## 2019-10-15 HISTORY — DX: Chronic obstructive pulmonary disease, unspecified: J44.9

## 2019-10-15 LAB — CBC
HCT: 38.6 % (ref 36.0–46.0)
HCT: 38.5 % (ref 36.0–46.0)
Hemoglobin: 11.6 g/dL — ABNORMAL LOW (ref 12.0–15.0)
Hemoglobin: 11.7 g/dL — ABNORMAL LOW (ref 12.0–15.0)
MCH: 24.9 pg — ABNORMAL LOW (ref 26.0–34.0)
MCH: 25 pg — ABNORMAL LOW (ref 26.0–34.0)
MCHC: 30.1 g/dL (ref 30.0–36.0)
MCHC: 30.4 g/dL (ref 30.0–36.0)
MCV: 83 fL (ref 80.0–100.0)
MCV: 82.3 fL (ref 80.0–100.0)
Platelets: 111 10*3/uL — ABNORMAL LOW (ref 150–400)
Platelets: 111 10*3/uL — ABNORMAL LOW (ref 150–400)
RBC: 4.65 MIL/uL (ref 3.87–5.11)
RBC: 4.68 MIL/uL (ref 3.87–5.11)
RDW: 15.8 % — ABNORMAL HIGH (ref 11.5–15.5)
RDW: 15.7 % — ABNORMAL HIGH (ref 11.5–15.5)
WBC: 3.7 10*3/uL — ABNORMAL LOW (ref 4.0–10.5)
WBC: 3.8 10*3/uL — ABNORMAL LOW (ref 4.0–10.5)
nRBC: 0 % (ref 0.0–0.2)
nRBC: 0 % (ref 0.0–0.2)

## 2019-10-15 LAB — BASIC METABOLIC PANEL
Anion gap: 7 (ref 5–15)
BUN: 28 mg/dL — ABNORMAL HIGH (ref 8–23)
CO2: 21 mmol/L — ABNORMAL LOW (ref 22–32)
Calcium: 8.5 mg/dL — ABNORMAL LOW (ref 8.9–10.3)
Chloride: 112 mmol/L — ABNORMAL HIGH (ref 98–111)
Creatinine, Ser: 1.01 mg/dL — ABNORMAL HIGH (ref 0.44–1.00)
GFR calc Af Amer: 59 mL/min — ABNORMAL LOW (ref >60)
GFR calc non Af Amer: 51 mL/min — ABNORMAL LOW (ref >60)
Glucose, Bld: 144 mg/dL — ABNORMAL HIGH (ref 70–99)
Potassium: 5.2 mmol/L — ABNORMAL HIGH (ref 3.5–5.1)
Sodium: 140 mmol/L (ref 135–145)

## 2019-10-15 LAB — FERRITIN: Ferritin: 289 ng/mL (ref 11–307)

## 2019-10-15 LAB — TROPONIN I (HIGH SENSITIVITY)
Troponin I (High Sensitivity): 13 ng/L (ref <18)
Troponin I (High Sensitivity): 12 ng/L (ref <18)

## 2019-10-15 LAB — DIFFERENTIAL
Abs Immature Granulocytes: 0.08 10*3/uL — ABNORMAL HIGH (ref 0.00–0.07)
Basophils Absolute: 0 10*3/uL (ref 0.0–0.1)
Basophils Relative: 1 %
Eosinophils Absolute: 0 10*3/uL (ref 0.0–0.5)
Eosinophils Relative: 0 %
Immature Granulocytes: 2 %
Lymphocytes Relative: 22 %
Lymphs Abs: 0.8 10*3/uL (ref 0.7–4.0)
Monocytes Absolute: 0.3 10*3/uL (ref 0.1–1.0)
Monocytes Relative: 7 %
Neutro Abs: 2.6 10*3/uL (ref 1.7–7.7)
Neutrophils Relative %: 68 %
Smear Review: UNDETERMINED

## 2019-10-15 LAB — C-REACTIVE PROTEIN: CRP: 3.2 mg/dL — ABNORMAL HIGH (ref <1.0)

## 2019-10-15 LAB — LACTATE DEHYDROGENASE: LDH: 176 U/L (ref 98–192)

## 2019-10-15 LAB — POC SARS CORONAVIRUS 2 AG: SARS Coronavirus 2 Ag: POSITIVE — AB

## 2019-10-15 LAB — PROCALCITONIN: Procalcitonin: <0.1 ng/mL

## 2019-10-15 LAB — TRIGLYCERIDES: Triglycerides: 97 mg/dL (ref <150)

## 2019-10-15 LAB — BRAIN NATRIURETIC PEPTIDE: B Natriuretic Peptide: 219 pg/mL — ABNORMAL HIGH (ref 0.0–100.0)

## 2019-10-15 LAB — FIBRINOGEN: Fibrinogen: 486 mg/dL — ABNORMAL HIGH (ref 210–475)

## 2019-10-15 LAB — LACTIC ACID, PLASMA
Lactic Acid, Venous: 2.1 mmol/L (ref 0.5–1.9)
Lactic Acid, Venous: 1.2 mmol/L (ref 0.5–1.9)

## 2019-10-15 LAB — FIBRIN DERIVATIVES D-DIMER (ARMC ONLY): Fibrin derivatives D-dimer (ARMC): 2701.25 ng/mL (FEU) — ABNORMAL HIGH (ref 0.00–499.00)

## 2019-10-15 MED ORDER — LORAZEPAM 1 MG PO TABS
1.0000 mg | ORAL_TABLET | Freq: Once | ORAL | Status: AC
  Administered 2019-10-15: 1 mg via ORAL
  Filled 2019-10-15: qty 1

## 2019-10-15 MED ORDER — DEXAMETHASONE SODIUM PHOSPHATE 10 MG/ML IJ SOLN
6.0000 mg | Freq: Once | INTRAMUSCULAR | Status: AC
  Administered 2019-10-15: 6 mg via INTRAVENOUS
  Filled 2019-10-15: qty 1

## 2019-10-15 MED ORDER — PHENOL 1.4 % MT LIQD
1.0000 | OROMUCOSAL | Status: DC | PRN
  Filled 2019-10-15: qty 177

## 2019-10-15 MED ORDER — SODIUM CHLORIDE 0.9 % IV SOLN
2.0000 g | INTRAVENOUS | Status: DC
  Administered 2019-10-15: 2 g via INTRAVENOUS
  Filled 2019-10-15: qty 20

## 2019-10-15 MED ORDER — SODIUM CHLORIDE 0.9 % IV SOLN
500.0000 mg | INTRAVENOUS | Status: DC
  Administered 2019-10-15: 500 mg via INTRAVENOUS
  Filled 2019-10-15: qty 500

## 2019-10-15 NOTE — ED Notes (Signed)
Patient's brief changed and repositioned in bed. Patient requesting food and to talk to her son. Patient's son called by this RN and phone given to patient. Patient given drink and food tray. Sitting up in bed eating without difficulty.

## 2019-10-15 NOTE — ED Notes (Signed)
Pt lying in bed showing no signs of acute distress. Pt was changed into a new brief due to incontinence voiding and a new purwick was placed. Pt was given new warm blankets for comfort and at this time denies any needs from staff

## 2019-10-15 NOTE — ED Notes (Addendum)
Pt currently refusing IV medication administration. States that she wants to go home and she doesn't have an infection and that she is not sick. States "I dont understand why I have to take that medication, I'm not taking it". This nurse attempted to explain the reasoning for dexamethasone and antibiotics and pt still refusing. Pt also states that she doesn't think she need oxygen and that she feels like she can breathe fine. Pt SpO2 93-99% on 2L nasal cannula. MD made aware.

## 2019-10-15 NOTE — ED Provider Notes (Signed)
[facility] Emergency Department Provider Note  ____________________________________________  Time seen: Approximately 7:01 AM  I have reviewed the triage vital signs and the nursing notes.   HISTORY  Chief Complaint Shortness of Breath    Level 5 Caveat: Portions of the History and Physical including HPI and review of systems are unable to be completely obtained due to patient being a poor historian   HPI [name] is a 85 y.o. female with a history of COPD diabetes GERD vascular dementia who was sent to the ED from her assisted living facility due to shortness of breath today.  Reportedly the patient is Covid positive.  Normally she uses 2 L nasal cannula only as needed, and today her initial room air oxygen saturation was 86%.  With 2 L nasal cannula it increases to 95%.  Patient was given a nebulizer treatment by EMS and feels better.  She currently denies any complaints.      Past Medical History:  Diagnosis Date  . Anxiety   . COPD (chronic obstructive pulmonary disease) (HCC)   . Depression   . Diabetes mellitus without complication (HCC)   . GERD (gastroesophageal reflux disease)   . Hip fracture (HCC)   . Hyperlipemia   . Neuropathy   . Vertigo      Patient Active Problem List   Diagnosis Date Noted  . Hypoxia 05/23/2019  . Acute delirium 08/16/2018  . Dementia with behavioral disturbance (HCC) 08/16/2018  . Encephalopathy acute 08/11/2018  . Sepsis (HCC) 05/21/2017  . Aspiration pneumonia (HCC) 05/21/2017  . GERD (gastroesophageal reflux disease) 05/21/2017  . HLD (hyperlipidemia) 05/21/2017  . Depression 05/21/2017  . Anxiety 05/21/2017  . Elevated troponin 05/21/2017  . Diabetes (HCC) 05/21/2017  . Right humeral fracture 05/21/2017  . Nausea and vomiting 03/31/2015  . Microcytic anemia 03/31/2015     Past Surgical History:  Procedure Laterality Date  . ABDOMINAL HYSTERECTOMY    . APPENDECTOMY    . BACK SURGERY      tumor removal bengin  . CHOLECYSTECTOMY       Prior to Admission medications   Medication Sig Start Date End Date Taking? Authorizing Provider  acetaminophen (TYLENOL) 325 MG tablet Take 650 mg by mouth every 4 (four) hours as needed for mild pain or moderate pain.     [provider]  alum & mag hydroxide-simeth (MAALOX/MYLANTA) 200-200-20 MG/5ML suspension Take 30 mLs by mouth as needed for indigestion or heartburn.    [provider]  amLODipine (NORVASC) 5 MG tablet Take 5 mg by mouth daily.    [provider]  aspirin EC 81 MG tablet Take 81 mg by mouth daily.    [provider]  bismuth subsalicylate (PEPTO BISMOL) 262 MG chewable tablet Chew 262 mg by mouth as needed.    [provider]  calcium carbonate (TUMS - DOSED IN MG ELEMENTAL CALCIUM) 500 MG chewable tablet Chew 2 tablets (400 mg of elemental calcium total) by mouth 3 (three) times daily as needed for indigestion or heartburn. 08/23/18   Alford Highland, MD  cetirizine (ZYRTEC) 10 MG tablet Take 10 mg by mouth daily.    [provider]  dicyclomine (BENTYL) 20 MG tablet Take 20 mg by mouth 4 (four) times daily.    [provider]  divalproex (DEPAKOTE ER) 250 MG 24 hr tablet Take 250 mg by mouth at bedtime.    [provider]  esomeprazole (NEXIUM) 20 MG capsule Take 20 mg by mouth  daily.     [provider]  fluticasone (FLONASE) 50 MCG/ACT nasal spray Place 1 spray into both nostrils daily.     [provider]  furosemide (LASIX) 20 MG tablet Take 1 tablet (20 mg total) by mouth daily. 05/29/19   Altamese Dilling, MD  insulin aspart (NOVOLOG) 100 UNIT/ML injection 3 units with each meal if she eats Patient taking differently: Ss 201-250= 2u; 251-300= 4u; 301-350= 6u; 351-400= 10u; notify md if >70 or <400 08/23/18   Wieting, Richard, MD  ipratropium-albuterol (DUONEB) 0.5-2.5 (3) MG/3ML SOLN Take 3 mLs by nebulization 2 (two) times  daily. 08/23/18   Alford Highland, MD  ketotifen (ZADITOR) 0.025 % ophthalmic solution Place 1 drop into both eyes 2 (two) times daily.     [provider]  loperamide (IMODIUM) 2 MG capsule Take 2 mg by mouth daily as needed for diarrhea or loose stools.     [provider]  LORazepam (ATIVAN) 0.5 MG tablet Take 0.5 mg by mouth every 8 (eight) hours.    [provider]  magnesium hydroxide (MILK OF MAGNESIA) 400 MG/5ML suspension Take 30 mLs by mouth daily as needed for mild constipation.    [provider]  metFORMIN (GLUCOPHAGE-XR) 500 MG 24 hr tablet Take 500 mg by mouth 2 (two) times a day.    [provider]  metoprolol tartrate (LOPRESSOR) 25 MG tablet Take 1 tablet (25 mg total) by mouth 2 (two) times daily. 08/23/18   Alford Highland, MD  Multiple Vitamin (MULTIVITAMIN WITH MINERALS) TABS tablet Take 1 tablet by mouth daily. 08/23/18   Alford Highland, MD  olopatadine (PATANOL) 0.1 % ophthalmic solution Place 1 drop into both eyes daily.    [provider]  ondansetron (ZOFRAN) 4 MG tablet Take 4 mg by mouth every 8 (eight) hours as needed for nausea or vomiting.    [provider]  polyethylene glycol (MIRALAX / GLYCOLAX) 17 g packet Take 17 g by mouth daily as needed for mild constipation. 05/28/19   Altamese Dilling, MD  psyllium (METAMUCIL) 58.6 % powder Take 1 packet by mouth 2 (two) times a day.    [provider]  QUEtiapine (SEROQUEL) 25 MG tablet Take 1 tablet (25 mg total) by mouth at bedtime as needed (psychosis, insomnia). Patient taking differently: Take 12.5 mg by mouth daily.  08/23/18   Alford Highland, MD  sodium phosphate Pediatric (FLEET) 3.5-9.5 GM/59ML enema Place 1 enema rectally once as needed for severe constipation.    [provider]  telmisartan (MICARDIS) 40 MG tablet Take 40 mg by mouth daily.     [provider]     Allergies Ampicillin and Sulfa  antibiotics   Family History  Problem Relation Age of Onset  . Diabetes Mother   . Diabetes Sister        x2  . Breast cancer Sister   . Heart disease Father   . Heart disease Brother        x3  . Bladder Cancer Neg Hx   . Kidney cancer Neg Hx   . Prostate cancer Neg Hx     Social History Social History   Tobacco Use  . Smoking status: Never Smoker  . Smokeless tobacco: Never Used  Substance Use Topics  . Alcohol use: No    Alcohol/week: 0.0 standard drinks  . Drug use: No    Review of Systems Level 5 Caveat: Portions of the History and Physical including HPI and review of systems  are unable to be completely obtained due to patient being a poor historian   Constitutional:   No known fever.  ENT:   No rhinorrhea. Cardiovascular:   No chest pain or syncope. Respiratory: Positive shortness of breath and cough. Gastrointestinal:   Negative for abdominal pain, vomiting and diarrhea.  Musculoskeletal:   Negative for focal pain or swelling ____________________________________________   PHYSICAL EXAM:  VITAL SIGNS: ED Triage Vitals  Enc Vitals Group     BP 10/15/19 0640 (!) 128/53     Pulse Rate 10/15/19 0640 89     Resp 10/15/19 0640 18     Temp 10/15/19 0640 99.7 F (37.6 C)     Temp Source 10/15/19 0640 Oral     SpO2 10/15/19 0640 94 %     Weight 10/15/19 0641 168 lb 3.4 oz (76.3 kg)     Height 10/15/19 0641 5\' 5"  (1.651 m)     Head Circumference --      Peak Flow --      Pain Score 10/15/19 0640 0     Pain Loc --      Pain Edu? --      Excl. in GC? --     Vital signs reviewed, nursing assessments reviewed.   Constitutional:   Alert and oriented to person and place. Non-toxic appearance. Eyes:   Conjunctivae are normal. EOMI. PERRL. ENT      Head:   Normocephalic and atraumatic.      Nose:   No congestion/rhinnorhea.       Mouth/Throat:   MMM, no pharyngeal erythema. No peritonsillar mass.       Neck:   No meningismus. Full  ROM. Hematological/Lymphatic/Immunilogical:   No cervical lymphadenopathy. Cardiovascular:   RRR. Symmetric bilateral radial and DP pulses.  No murmurs. Cap refill less than 2 seconds. Respiratory:   Normal respiratory effort without tachypnea/retractions.  Bilateral basilar crackles, diminished breath sounds at the left base Gastrointestinal:   Soft and nontender. Non distended. There is no CVA tenderness.  No rebound, rigidity, or guarding.  Musculoskeletal:   Normal range of motion in all extremities. No joint effusions.  No lower extremity tenderness.  No edema. Neurologic:   Normal speech and language.  Motor grossly intact. No acute focal neurologic deficits are appreciated.  Skin:    Skin is warm, dry and intact. No rash noted.  No petechiae, purpura, or bullae.  ____________________________________________    LABS (pertinent positives/negatives) (all labs ordered are listed, but only abnormal results are displayed) Labs Reviewed  CBC - Abnormal; Notable for the following components:      Result Value   WBC 3.7 (*)    Hemoglobin 11.6 (*)    MCH 24.9 (*)    RDW 15.8 (*)    Platelets 111 (*)    All other components within normal limits  BASIC METABOLIC PANEL  DIFFERENTIAL  CBC  POC SARS CORONAVIRUS 2 AG -  ED  TROPONIN I (HIGH SENSITIVITY)   ____________________________________________   EKG  Interpreted by me Sinus rhythm rate of 88, left axis, right bundle branch block and left anterior fascicular block.  No acute ischemic changes.  Unchanged compared to prior EKG June 06, 2019.  ____________________________________________    RADIOLOGY  Dg Chest Portable 1 View  Result Date: 10/15/2019 CLINICAL DATA:  Shortness of breath.  COVID-19 positive EXAM: PORTABLE CHEST 1 VIEW COMPARISON:  06/06/2019 FINDINGS: Left perihilar airspace disease. Cardiomegaly accentuated by leftward rotation. Surgical clips over the right mediastinum which are  posterior mediastinal by  05/2019 CT. Hyperinflation. Remote right humeral neck fracture IMPRESSION: Left perihilar pneumonia. Electronically Signed   By: Monte Fantasia M.D.   On: 10/15/2019 07:08    ____________________________________________   PROCEDURES Procedures  ____________________________________________  DIFFERENTIAL DIAGNOSIS   Pneumonia, COVID-19 pneumonitis, pulmonary edema, pleural effusion, pneumothorax, COPD exacerbation  CLINICAL IMPRESSION / ASSESSMENT AND PLAN / ED COURSE  Medications ordered in the ED: Medications  cefTRIAXone (ROCEPHIN) 2 g in sodium chloride 0.9 % 100 mL IVPB (has no administration in time range)  azithromycin (ZITHROMAX) 500 mg in sodium chloride 0.9 % 250 mL IVPB (has no administration in time range)  dexamethasone (DECADRON) injection 6 mg (has no administration in time range)    Pertinent labs & imaging results that were available during my care of the patient were reviewed by me and considered in my medical decision making (see chart for details).   [name] was evaluated in Emergency Department on 10/15/2019 for the symptoms described in the history of present illness. She was evaluated in the context of the global COVID-19 pandemic, which necessitated consideration that the patient might be at risk for infection with the SARS-CoV-2 virus that causes COVID-19. Institutional protocols and algorithms that pertain to the evaluation of patients at risk for COVID-19 are in a state of rapid change based on information released by regulatory bodies including the CDC and federal and state organizations. These policies and algorithms were followed during the patient's care in the ED.   Patient presents with shortness of breath, acute on chronic respiratory failure with room air sat of 86%.  Stabilized on 2 L nasal cannula oxygen.  Reported Covid positive by [facility], but test result is not available in the EMR.  Exam concerning for left lower lobe  consolidation.  Chest x-ray consistent with a left lung pneumonia with relatively normal-appearing right lung.  We will retest for Covid, start ceftriaxone and azithromycin for bacterial pneumonia, plan to admit to hospital for further management.  IV Decadron ordered for COPD and Covid.      ____________________________________________   FINAL CLINICAL IMPRESSION(S) / ED DIAGNOSES    Final diagnoses:  Acute on chronic respiratory failure with hypoxia ([place])  Vascular dementia with behavior disturbance ([place])  Pneumonia due to COVID-19 virus     ED Discharge Orders    None      Portions of this note were generated with dragon dictation software. Dictation errors may occur despite best attempts at proofreading.   Carrie Mew, MD 10/15/19 [phone]

## 2019-10-15 NOTE — ED Provider Notes (Signed)
-----------------------------------------   9:01 AM on 10/15/2019 -----------------------------------------  I spoke to [facility] they were able to confirm the patient tested positive for Covid on 10/12/2019.  I spoke to the hospitalist regarding admission for the patient, they state that [facility] is excepting low priority patient today due to a reduction in census and they would prefer the patient be transferred to [facility].  I spoke to [facility] and they did confirm this and they will accept the patient to their service.  We are currently pending bed and transport availability for the transfer.   Harvest Dark, MD 10/15/19 [phone]

## 2019-10-15 NOTE — ED Notes (Signed)
Pt was wet with urine, pt gowns, linens, and undergarments changed. Purewick placed on pt and pt repositioned.

## 2019-10-15 NOTE — ED Notes (Signed)
This nurse inserted another IV in pt left AC, explained to pt the importance of keeping IV in place and not taking it out, pt states understanding.

## 2019-10-15 NOTE — ED Notes (Signed)
This nurse walked into pt room and found pt lying down. Pt took out both IV's and states "I thought I was suppose to take them out". This nurse informed the pt that she is not suppose to take her IV's out and the importance of her having her IV's, pt states understanding. Pt IV from Left Antecubital and left hand were both intact. Posey pad was placed under pt and will alarm if pt gets out of bed.

## 2019-10-15 NOTE — ED Notes (Signed)
One set of blood cultures sent for this pt by this nurse. Unable to obtain second set of blood cultures at this time.

## 2019-10-15 NOTE — ED Notes (Signed)
Patient sleeping with even, unlabored respirations. Oxygen sat dropping to 85% while sleeping. Patient continues to remove [place] while awake. Blow-by oxygen placed near patient. O2 improved to 95%.

## 2019-10-15 NOTE — ED Triage Notes (Signed)
Pt arrives to ED via [facility] from [facility] in [facility]) with c/o [facility]. EMS reports pt is COVID+ with a h/x of COPD. Pt uses 2L O2 PRN; EMS reports pt's initial room air sats were 90%-improved to 95% on 2L via [place]. Pt denies any c/o of pain and denies feeling SHOB on arrival following 1 breathing t/x that was administered by the facility prior to transport. Pt is A&O, in NAD; RR even, regular, and unlabored.

## 2019-10-16 ENCOUNTER — Other Ambulatory Visit: Payer: Self-pay

## 2019-10-16 ENCOUNTER — Encounter (HOSPITAL_COMMUNITY): Payer: Self-pay

## 2019-10-16 DIAGNOSIS — J1289 Other viral pneumonia: Secondary | ICD-10-CM

## 2019-10-16 DIAGNOSIS — R7989 Other specified abnormal findings of blood chemistry: Secondary | ICD-10-CM

## 2019-10-16 DIAGNOSIS — E872 Acidosis, unspecified: Secondary | ICD-10-CM

## 2019-10-16 DIAGNOSIS — U071 COVID-19: Principal | ICD-10-CM | POA: Diagnosis present

## 2019-10-16 DIAGNOSIS — J1282 Pneumonia due to coronavirus disease 2019: Secondary | ICD-10-CM | POA: Diagnosis present

## 2019-10-16 LAB — HEMOGLOBIN A1C
Hgb A1c MFr Bld: 7 % — ABNORMAL HIGH (ref 4.8–5.6)
Mean Plasma Glucose: 154.2 mg/dL

## 2019-10-16 LAB — PHOSPHORUS: Phosphorus: 2.6 mg/dL (ref 2.5–4.6)

## 2019-10-16 LAB — CBC WITH DIFFERENTIAL/PLATELET
Abs Immature Granulocytes: 0.01 10*3/uL (ref 0.00–0.07)
Basophils Absolute: 0 10*3/uL (ref 0.0–0.1)
Basophils Relative: 0 %
Eosinophils Absolute: 0 10*3/uL (ref 0.0–0.5)
Eosinophils Relative: 0 %
HCT: 34.2 % — ABNORMAL LOW (ref 36.0–46.0)
Hemoglobin: 10.1 g/dL — ABNORMAL LOW (ref 12.0–15.0)
Immature Granulocytes: 0 %
Lymphocytes Relative: 20 %
Lymphs Abs: 0.7 10*3/uL (ref 0.7–4.0)
MCH: 24.6 pg — ABNORMAL LOW (ref 26.0–34.0)
MCHC: 29.5 g/dL — ABNORMAL LOW (ref 30.0–36.0)
MCV: 83.4 fL (ref 80.0–100.0)
Monocytes Absolute: 0.3 10*3/uL (ref 0.1–1.0)
Monocytes Relative: 9 %
Neutro Abs: 2.4 10*3/uL (ref 1.7–7.7)
Neutrophils Relative %: 71 %
Platelets: 122 10*3/uL — ABNORMAL LOW (ref 150–400)
RBC: 4.1 MIL/uL (ref 3.87–5.11)
RDW: 15.4 % (ref 11.5–15.5)
WBC: 3.4 10*3/uL — ABNORMAL LOW (ref 4.0–10.5)
nRBC: 0 % (ref 0.0–0.2)

## 2019-10-16 LAB — COMPREHENSIVE METABOLIC PANEL
ALT: 13 U/L (ref 0–44)
AST: 15 U/L (ref 15–41)
Albumin: 2.8 g/dL — ABNORMAL LOW (ref 3.5–5.0)
Alkaline Phosphatase: 57 U/L (ref 38–126)
Anion gap: 10 (ref 5–15)
BUN: 26 mg/dL — ABNORMAL HIGH (ref 8–23)
CO2: 21 mmol/L — ABNORMAL LOW (ref 22–32)
Calcium: 8.7 mg/dL — ABNORMAL LOW (ref 8.9–10.3)
Chloride: 110 mmol/L (ref 98–111)
Creatinine, Ser: 0.78 mg/dL (ref 0.44–1.00)
GFR calc Af Amer: >60 mL/min (ref >60)
GFR calc non Af Amer: >60 mL/min (ref >60)
Glucose, Bld: 129 mg/dL — ABNORMAL HIGH (ref 70–99)
Potassium: 4.8 mmol/L (ref 3.5–5.1)
Sodium: 141 mmol/L (ref 135–145)
Total Bilirubin: 0.2 mg/dL — ABNORMAL LOW (ref 0.3–1.2)
Total Protein: 6.3 g/dL — ABNORMAL LOW (ref 6.5–8.1)

## 2019-10-16 LAB — C-REACTIVE PROTEIN: CRP: 2.4 mg/dL — ABNORMAL HIGH (ref <1.0)

## 2019-10-16 LAB — GLUCOSE, CAPILLARY
Glucose-Capillary: 105 mg/dL — ABNORMAL HIGH (ref 70–99)
Glucose-Capillary: 175 mg/dL — ABNORMAL HIGH (ref 70–99)
Glucose-Capillary: 209 mg/dL — ABNORMAL HIGH (ref 70–99)
Glucose-Capillary: 134 mg/dL — ABNORMAL HIGH (ref 70–99)

## 2019-10-16 LAB — ABO/RH: ABO/RH(D): A POS

## 2019-10-16 LAB — FERRITIN: Ferritin: 257 ng/mL (ref 11–307)

## 2019-10-16 LAB — MRSA PCR SCREENING: MRSA by PCR: NEGATIVE

## 2019-10-16 LAB — D-DIMER, QUANTITATIVE: D-Dimer, Quant: 2.3 ug/mL-FEU — ABNORMAL HIGH (ref 0.00–0.50)

## 2019-10-16 LAB — MAGNESIUM: Magnesium: 1.7 mg/dL (ref 1.7–2.4)

## 2019-10-16 MED ORDER — ENOXAPARIN SODIUM 40 MG/0.4ML ~~LOC~~ SOLN
40.0000 mg | SUBCUTANEOUS | Status: DC
  Administered 2019-10-16 – 2019-10-24 (×9): 40 mg via SUBCUTANEOUS
  Filled 2019-10-16 (×9): qty 0.4

## 2019-10-16 MED ORDER — DEXAMETHASONE SODIUM PHOSPHATE 10 MG/ML IJ SOLN
6.0000 mg | INTRAMUSCULAR | Status: DC
  Administered 2019-10-16: 6 mg via INTRAVENOUS
  Filled 2019-10-16: qty 1

## 2019-10-16 MED ORDER — ONDANSETRON HCL 4 MG/2ML IJ SOLN
4.0000 mg | Freq: Four times a day (QID) | INTRAMUSCULAR | Status: DC | PRN
  Administered 2019-10-17 – 2019-10-20 (×3): 4 mg via INTRAVENOUS
  Filled 2019-10-16 (×3): qty 2

## 2019-10-16 MED ORDER — FUROSEMIDE 20 MG PO TABS
20.0000 mg | ORAL_TABLET | Freq: Every day | ORAL | Status: DC
  Administered 2019-10-16 – 2019-10-19 (×4): 20 mg via ORAL
  Filled 2019-10-16 (×4): qty 1

## 2019-10-16 MED ORDER — DIVALPROEX SODIUM ER 250 MG PO TB24
250.0000 mg | ORAL_TABLET | Freq: Every day | ORAL | Status: DC
  Administered 2019-10-16 – 2019-10-23 (×8): 250 mg via ORAL
  Filled 2019-10-16 (×9): qty 1

## 2019-10-16 MED ORDER — SODIUM CHLORIDE 0.9 % IV SOLN
100.0000 mg | Freq: Every day | INTRAVENOUS | Status: AC
  Administered 2019-10-17 – 2019-10-20 (×4): 100 mg via INTRAVENOUS
  Filled 2019-10-16 (×5): qty 20

## 2019-10-16 MED ORDER — METOPROLOL TARTRATE 25 MG PO TABS
25.0000 mg | ORAL_TABLET | Freq: Two times a day (BID) | ORAL | Status: DC
  Administered 2019-10-16 – 2019-10-22 (×12): 25 mg via ORAL
  Filled 2019-10-16 (×13): qty 1

## 2019-10-16 MED ORDER — SODIUM CHLORIDE 0.9 % IV SOLN: 100.0000 mg | Freq: Every day | INTRAVENOUS | Status: DC

## 2019-10-16 MED ORDER — DM-GUAIFENESIN ER 30-600 MG PO TB12
1.0000 | ORAL_TABLET | Freq: Two times a day (BID) | ORAL | Status: DC
  Administered 2019-10-16 – 2019-10-24 (×17): 1 via ORAL
  Filled 2019-10-16 (×17): qty 1

## 2019-10-16 MED ORDER — DICYCLOMINE HCL 20 MG PO TABS
20.0000 mg | ORAL_TABLET | Freq: Four times a day (QID) | ORAL | Status: DC
  Administered 2019-10-16 – 2019-10-24 (×25): 20 mg via ORAL
  Filled 2019-10-16 (×35): qty 1

## 2019-10-16 MED ORDER — ONDANSETRON HCL 4 MG PO TABS
4.0000 mg | ORAL_TABLET | Freq: Four times a day (QID) | ORAL | Status: DC | PRN
  Administered 2019-10-22: 4 mg via ORAL
  Filled 2019-10-16: qty 1

## 2019-10-16 MED ORDER — INSULIN ASPART 100 UNIT/ML ~~LOC~~ SOLN
0.0000 [IU] | Freq: Every day | SUBCUTANEOUS | Status: DC
  Administered 2019-10-21 – 2019-10-22 (×2): 2 [IU] via SUBCUTANEOUS

## 2019-10-16 MED ORDER — PANTOPRAZOLE SODIUM 40 MG PO TBEC
40.0000 mg | DELAYED_RELEASE_TABLET | Freq: Every day | ORAL | Status: DC
  Administered 2019-10-16 – 2019-10-24 (×9): 40 mg via ORAL
  Filled 2019-10-16 (×9): qty 1

## 2019-10-16 MED ORDER — ALBUTEROL SULFATE HFA 108 (90 BASE) MCG/ACT IN AERS
2.0000 | INHALATION_SPRAY | Freq: Four times a day (QID) | RESPIRATORY_TRACT | Status: DC | PRN
  Administered 2019-10-20 – 2019-10-21 (×2): 2 via RESPIRATORY_TRACT
  Filled 2019-10-16: qty 6.7

## 2019-10-16 MED ORDER — INSULIN ASPART 100 UNIT/ML ~~LOC~~ SOLN
0.0000 [IU] | Freq: Three times a day (TID) | SUBCUTANEOUS | Status: DC
  Administered 2019-10-16: 5 [IU] via SUBCUTANEOUS
  Administered 2019-10-16: 3 [IU] via SUBCUTANEOUS
  Administered 2019-10-17 (×2): 2 [IU] via SUBCUTANEOUS
  Administered 2019-10-17 – 2019-10-18 (×2): 3 [IU] via SUBCUTANEOUS
  Administered 2019-10-18: 2 [IU] via SUBCUTANEOUS
  Administered 2019-10-19 (×2): 3 [IU] via SUBCUTANEOUS
  Administered 2019-10-20: 2 [IU] via SUBCUTANEOUS
  Administered 2019-10-20: 5 [IU] via SUBCUTANEOUS
  Administered 2019-10-20: 3 [IU] via SUBCUTANEOUS
  Administered 2019-10-21 (×2): 5 [IU] via SUBCUTANEOUS
  Administered 2019-10-22: 2 [IU] via SUBCUTANEOUS
  Administered 2019-10-22: 8 [IU] via SUBCUTANEOUS
  Administered 2019-10-22 – 2019-10-24 (×4): 3 [IU] via SUBCUTANEOUS
  Administered 2019-10-24: 2 [IU] via SUBCUTANEOUS

## 2019-10-16 MED ORDER — LORAZEPAM 0.5 MG PO TABS
0.2500 mg | ORAL_TABLET | Freq: Four times a day (QID) | ORAL | Status: DC | PRN
  Administered 2019-10-16 – 2019-10-24 (×15): 0.25 mg via ORAL
  Filled 2019-10-16 (×15): qty 1

## 2019-10-16 MED ORDER — VITAMIN D 25 MCG (1000 UNIT) PO TABS
2000.0000 [IU] | ORAL_TABLET | Freq: Every day | ORAL | Status: DC
  Administered 2019-10-16 – 2019-10-24 (×9): 2000 [IU] via ORAL
  Filled 2019-10-16 (×9): qty 2

## 2019-10-16 MED ORDER — ALUM & MAG HYDROXIDE-SIMETH 200-200-20 MG/5ML PO SUSP
30.0000 mL | ORAL | Status: DC | PRN
  Administered 2019-10-17: 30 mL via ORAL
  Filled 2019-10-16: qty 30

## 2019-10-16 MED ORDER — VITAMIN C 500 MG PO TABS
500.0000 mg | ORAL_TABLET | Freq: Every day | ORAL | Status: DC
  Administered 2019-10-16 – 2019-10-24 (×9): 500 mg via ORAL
  Filled 2019-10-16 (×8): qty 1

## 2019-10-16 MED ORDER — KETOTIFEN FUMARATE 0.025 % OP SOLN: 1.0000 [drp] | Freq: Two times a day (BID) | OPHTHALMIC | Status: DC

## 2019-10-16 MED ORDER — ACETAMINOPHEN 325 MG PO TABS
650.0000 mg | ORAL_TABLET | Freq: Four times a day (QID) | ORAL | Status: DC | PRN
  Administered 2019-10-16 (×2): 650 mg via ORAL
  Filled 2019-10-16 (×2): qty 2

## 2019-10-16 MED ORDER — ACETAMINOPHEN 325 MG PO TABS
650.0000 mg | ORAL_TABLET | ORAL | Status: DC | PRN
  Administered 2019-10-18 – 2019-10-24 (×5): 650 mg via ORAL
  Filled 2019-10-16 (×5): qty 2

## 2019-10-16 MED ORDER — OLOPATADINE HCL 0.1 % OP SOLN
1.0000 [drp] | Freq: Every day | OPHTHALMIC | Status: DC
  Administered 2019-10-16 – 2019-10-24 (×9): 1 [drp] via OPHTHALMIC
  Filled 2019-10-16: qty 5

## 2019-10-16 MED ORDER — HYDROCOD POLST-CPM POLST ER 10-8 MG/5ML PO SUER
5.0000 mL | Freq: Two times a day (BID) | ORAL | Status: DC | PRN
  Administered 2019-10-16 – 2019-10-24 (×3): 5 mL via ORAL
  Filled 2019-10-16 (×3): qty 5

## 2019-10-16 MED ORDER — BISMUTH SUBSALICYLATE 262 MG/15ML PO SUSP
15.0000 mL | ORAL | Status: DC | PRN
  Filled 2019-10-16: qty 118

## 2019-10-16 MED ORDER — ADULT MULTIVITAMIN W/MINERALS CH
1.0000 | ORAL_TABLET | Freq: Every day | ORAL | Status: DC
  Administered 2019-10-16 – 2019-10-24 (×9): 1 via ORAL
  Filled 2019-10-16 (×9): qty 1

## 2019-10-16 MED ORDER — MAGNESIUM SULFATE 2 GM/50ML IV SOLN
2.0000 g | Freq: Once | INTRAVENOUS | Status: AC
  Administered 2019-10-16: 2 g via INTRAVENOUS
  Filled 2019-10-16: qty 50

## 2019-10-16 MED ORDER — SODIUM CHLORIDE 0.9 % IV SOLN
200.0000 mg | Freq: Once | INTRAVENOUS | Status: AC
  Administered 2019-10-16: 200 mg via INTRAVENOUS
  Filled 2019-10-16: qty 40

## 2019-10-16 MED ORDER — ASPIRIN EC 81 MG PO TBEC
81.0000 mg | DELAYED_RELEASE_TABLET | Freq: Every day | ORAL | Status: DC
  Administered 2019-10-16 – 2019-10-24 (×9): 81 mg via ORAL
  Filled 2019-10-16 (×9): qty 1

## 2019-10-16 MED ORDER — ZINC SULFATE 220 (50 ZN) MG PO CAPS
220.0000 mg | ORAL_CAPSULE | Freq: Every day | ORAL | Status: DC
  Administered 2019-10-16 – 2019-10-24 (×9): 220 mg via ORAL
  Filled 2019-10-16 (×9): qty 1

## 2019-10-16 MED ORDER — AMLODIPINE BESYLATE 5 MG PO TABS
5.0000 mg | ORAL_TABLET | Freq: Every day | ORAL | Status: DC
  Administered 2019-10-16 – 2019-10-24 (×9): 5 mg via ORAL
  Filled 2019-10-16 (×9): qty 1

## 2019-10-16 MED ORDER — ENSURE ENLIVE PO LIQD
237.0000 mL | Freq: Two times a day (BID) | ORAL | Status: DC
  Administered 2019-10-16 – 2019-10-24 (×14): 237 mL via ORAL

## 2019-10-16 MED ORDER — ACETAMINOPHEN 325 MG PO TABS: 650.0000 mg | ORAL_TABLET | ORAL | Status: DC | PRN

## 2019-10-16 NOTE — Progress Notes (Signed)
Pharmacy Note - Remdesivir Dosing  O:  ALT:  CMP pending am CXR:  Left perihilar pneumonia  Requiring supplemental O2:  97% on 2L per [place]   A/P:  Patient meets criteria for remdesivir.  Begin remdesivir 200 mg IV x 1, followed by 100 mg IV daily x 4 days  Monitor ALT, clinical progress  Despina Pole, Pharm. D. Clinical Pharmacist 10/16/2019 3:11 AM

## 2019-10-16 NOTE — H&P (Signed)
History and Physical  [name] XLK:[id number] DOB: 23-Sep-1932 DOA: 10/15/2019  Referring physician: Carrie Mew MD PCP: Kirk Ruths, MD  Patient coming from: [facility]  Chief Complaint: Shortness of breath  HPI: [name] is an 85 y.o. female with medical history significant for COPD on supplemental oxygen at 2 LPM via [place], GERD, vascular dementia, hypertension, hyperlipidemia and type II DM who presents to [facility] ED from her assisted living facility ([facility]) due to shortness of  breath which started yesterday at the living facility prior to arrival the ED.  Patient was unable to provide history regarding why she came to the hospital and this is possibly due to underlying dementia, history was obtained from medical record.  Apparently, patient tested positive for Covid 19 virus on 10/12/2019 at the facility, O2 sat on room air was 86%, but this increased to 95% on her home supplemental oxygen (2 LPM via [place]).  EMS was called and patient was given a nebulizer treatment and was taken to ED for further evaluation and management.   ED Course: In the emergency department, temperature was 98.21F,RR 16/min, pulse 77bpm, BP around 10/47, O2 sats ranged from 93% - 100% on 2 LPM of oxygen.  Work-up in the ED showed pancytopenia, mild hyperkalemia, and hyperglycemia. SARS coronavirus 2 Ag test was positive.  Lactic acid was 2.1, proBNP 219, CRP 3.2, D-dimer was 2,701.25. Procalcitonin < 0.10.  Chest x-ray was interpreted as left perihilar pneumonia.  He was treated with IV ceftriaxone and azithromycin, IV Decadron 6 mg x 1 was given.  She was transferred to [facility] for further evaluation and management.  Review of Systems: Constitutional: Negative for chills and fever.  HENT: Negative for ear pain and sore throat.   Eyes: Negative for pain and visual disturbance.  Respiratory: Positive for shortness of breath and cough   Cardiovascular: Negative for chest pain and  palpitations.  Gastrointestinal: Negative for abdominal pain and vomiting.  Endocrine: Negative for polyphagia and polyuria.  Genitourinary: Negative for decreased urine volume, dysuria, enuresis Musculoskeletal: Negative for arthralgias and back pain.  Skin: Negative for color change and rash.  Allergic/Immunologic: Negative for immunocompromised state.  Neurological: Negative for tremors, syncope, light-headedness.  Hematological: Does not bruise/bleed easily.   Past Medical History:  Diagnosis Date  . Anxiety   . COPD (chronic obstructive pulmonary disease) ([place])   . Depression   . Diabetes mellitus without complication ([place])   . GERD (gastroesophageal reflux disease)   . Hip fracture ([place])   . Hyperlipemia   . Neuropathy   . Vertigo    Past Surgical History:  Procedure Laterality Date  . ABDOMINAL HYSTERECTOMY    . APPENDECTOMY    . BACK SURGERY     tumor removal bengin  . CHOLECYSTECTOMY      Social History:  reports that she has never smoked. She has never used smokeless tobacco. She reports that she does not drink alcohol or use drugs.   Allergies  Allergen Reactions  . Ampicillin     Zpac  . Sulfa Antibiotics     Family History  Problem Relation Age of Onset  . Diabetes Mother   . Diabetes Sister        x2  . Breast cancer Sister   . Heart disease Father   . Heart disease Brother        x3  . Bladder Cancer Neg Hx   . Kidney cancer Neg Hx   . Prostate cancer Neg  Hx       Prior to Admission medications   Medication Sig Start Date End Date Taking? Authorizing Provider  acetaminophen (TYLENOL) 500 MG tablet Take 1,000 mg by mouth 3 (three) times daily.     [provider]  alum & mag hydroxide-simeth (MAALOX/MYLANTA) 200-200-20 MG/5ML suspension Take 30 mLs by mouth as needed for indigestion or heartburn.    [provider]  amLODipine (NORVASC) 5 MG tablet Take 5 mg by mouth daily.    [provider]  aspirin EC 81 MG  tablet Take 81 mg by mouth daily.    [provider]  bismuth subsalicylate (PEPTO BISMOL) 262 MG/15ML suspension Take 262 mg by mouth as needed.     [provider]  calcium carbonate (TUMS - DOSED IN MG ELEMENTAL CALCIUM) 500 MG chewable tablet Chew 2 tablets (400 mg of elemental calcium total) by mouth 3 (three) times daily as needed for indigestion or heartburn. 08/23/18   Alford Highland, MD  cetirizine (ZYRTEC) 10 MG tablet Take 10 mg by mouth daily.    [provider]  Cholecalciferol (VITAMIN D) 50 MCG (2000 UT) tablet Take 2,000 Units by mouth daily.    [provider]  dicyclomine (BENTYL) 20 MG tablet Take 20 mg by mouth 4 (four) times daily.    [provider]  divalproex (DEPAKOTE ER) 250 MG 24 hr tablet Take 250 mg by mouth at bedtime.    [provider]  esomeprazole (NEXIUM) 20 MG capsule Take 20 mg by mouth daily.     [provider]  fluticasone (FLONASE) 50 MCG/ACT nasal spray Place 1 spray into both nostrils daily.     [provider]  furosemide (LASIX) 20 MG tablet Take 1 tablet (20 mg total) by mouth daily. 05/29/19   Altamese Dilling, MD  insulin aspart (NOVOLOG) 100 UNIT/ML injection 3 units with each meal if she eats Patient taking differently: Ss 201-250= 2u; 251-300= 4u; 301-350= 6u; 351-400= 10u; notify md if >70 or <400 08/23/18   Wieting, Richard, MD  ipratropium-albuterol (DUONEB) 0.5-2.5 (3) MG/3ML SOLN Take 3 mLs by nebulization 2 (two) times daily. 08/23/18   Alford Highland, MD  ketotifen (ZADITOR) 0.025 % ophthalmic solution Place 1 drop into both eyes 2 (two) times daily.     [provider]  loperamide (IMODIUM) 2 MG capsule Take 2 mg by mouth daily as needed for diarrhea or loose stools.     [provider]  LORazepam (ATIVAN) 0.5 MG tablet Take 0.25 mg by mouth 2 (two) times daily as needed.     [provider]  magnesium hydroxide (MILK OF MAGNESIA) 400  MG/5ML suspension Take 30 mLs by mouth daily as needed for mild constipation.    [provider]  metFORMIN (GLUCOPHAGE-XR) 500 MG 24 hr tablet Take 500 mg by mouth 2 (two) times a day.    [provider]  metoprolol tartrate (LOPRESSOR) 25 MG tablet Take 1 tablet (25 mg total) by mouth 2 (two) times daily. 08/23/18   Alford Highland, MD  Multiple Vitamin (MULTIVITAMIN WITH MINERALS) TABS tablet Take 1 tablet by mouth daily. 08/23/18   Alford Highland, MD  olopatadine (PATANOL) 0.1 % ophthalmic solution Place 1 drop into both eyes daily.    [provider]  ondansetron (ZOFRAN) 4 MG tablet Take 4 mg by mouth every 8 (eight) hours as needed for nausea or vomiting.    [provider]  polyethylene glycol (MIRALAX / GLYCOLAX) 17 g packet  Take 17 g by mouth daily as needed for mild constipation. 05/28/19   Altamese DillingVachhani, Vaibhavkumar, MD  psyllium (METAMUCIL) 58.6 % powder Take 1 packet by mouth 2 (two) times a day.    [provider]  QUEtiapine (SEROQUEL) 25 MG tablet Take 1 tablet (25 mg total) by mouth at bedtime as needed (psychosis, insomnia). Patient taking differently: Take 12.5 mg by mouth daily.  08/23/18   Alford HighlandWieting, Richard, MD  sodium phosphate Pediatric (FLEET) 3.5-9.5 GM/59ML enema Place 1 enema rectally once as needed for severe constipation.    [provider]  telmisartan (MICARDIS) 40 MG tablet Take 40 mg by mouth daily.     [provider]    Physical Exam: BP (!) 117/49   Pulse 66   Temp 98.6 F (37 C) (Oral)   Resp 18   Ht 5\' 5"  (1.651 m)   Wt 76.3 kg   SpO2 98%   BMI 27.99 kg/m   . General: 85 y.o. year-old female well developed well nourished in no acute distress.  Alert and oriented to person and place. Marland Kitchen. HEENT: Normocephalic, atraumatic, PERRLA . NECK: Supple, cranial medial, FROM . Cardiovascular: Regular rate and rhythm with no rubs or gallops.  No thyromegaly or JVD noted.  No lower extremity edema. 2/4  pulses in all 4 extremities. Marland Kitchen. Respiratory: Bilateral crackles lower lobes on auscultation.  Decreased breath sounds at the left base.  . Abdomen: Soft nontender nondistended with normal bowel sounds x4 quadrants. . Muskuloskeletal: No cyanosis, clubbing or edema noted bilaterally . Neuro: CN II-XII intact, strength, sensation, reflexes . Skin: No ulcerative lesions noted or rashes . Psychiatry: Judgement and insight appear normal. Mood is appropriate for condition and setting          Labs on Admission:  Basic Metabolic Panel: Recent Labs  Lab 10/15/19 0647  NA 140  K 5.2*  CL 112*  CO2 21*  GLUCOSE 144*  BUN 28*  CREATININE 1.01*  CALCIUM 8.5*   Liver Function Tests: No results for input(s): AST, ALT, ALKPHOS, BILITOT, PROT, ALBUMIN in the last 168 hours. No results for input(s): LIPASE, AMYLASE in the last 168 hours. No results for input(s): AMMONIA in the last 168 hours. CBC: Recent Labs  Lab 10/15/19 0647  WBC 3.8*  3.7*  NEUTROABS 2.6  HGB 11.7*  11.6*  HCT 38.5  38.6  MCV 82.3  83.0  PLT 111*  111*   Cardiac Enzymes: No results for input(s): CKTOTAL, CKMB, CKMBINDEX, TROPONINI in the last 168 hours.  BNP (last 3 results) Recent Labs    05/23/19 1036 10/15/19 0647  BNP 273.0* 219.0*    ProBNP (last 3 results) No results for input(s): PROBNP in the last 8760 hours.  CBG: No results for input(s): GLUCAP in the last 168 hours.  Radiological Exams on Admission: Dg Chest Portable 1 View  Result Date: 10/15/2019 CLINICAL DATA:  Shortness of breath.  COVID-19 positive EXAM: PORTABLE CHEST 1 VIEW COMPARISON:  06/06/2019 FINDINGS: Left perihilar airspace disease. Cardiomegaly accentuated by leftward rotation. Surgical clips over the right mediastinum which are posterior mediastinal by 05/2019 CT. Hyperinflation. Remote right humeral neck fracture IMPRESSION: Left perihilar pneumonia. Electronically Signed   By: Marnee SpringJonathon  Watts M.D.   On: 10/15/2019 07:08     EKG: I independently viewed the EKG done and my findings are as followed: EKG shows sinus rhythm at a rate of 88 bpm.  Assessment/Plan Present on Admission: . Pneumonia due to COVID-19 virus . GERD (gastroesophageal reflux  disease) . HLD (hyperlipidemia) . Depression . Anxiety . HTN (hypertension) . Hyperkalemia . Acute on chronic respiratory failure with hypoxia (HCC)  Principal Problem:   Pneumonia due to COVID-19 virus Active Problems:   GERD (gastroesophageal reflux disease)   HLD (hyperlipidemia)   Depression   Anxiety   Acute on chronic respiratory failure with hypoxia (HCC)   HTN (hypertension)   Hyperkalemia   Elevated brain natriuretic peptide (BNP) level   Lactic acidosis  Acute on chronic respiratory failure with hypoxia due to COVID-19 virus pneumonia Chest x-ray showed left perihilar pneumonia Patient was initially started with IV antibiotics with ceftriaxone and azithromycin due to presumed bacterial pneumonia, however procalcitonin was negative.  IV antibiotics will be held at this time, since this is possibly due to viral infection. Continue albuterol Q6H Continue IV Decadron 6 mg daily Remdesivir per pharmacy protocol Continue vit. C 500 mg and Zn 220 mg daily Continue mucolytics and antitussives Continue to obtain daily COVID inflammatory markers and adjust accordingly Continue supplemental oxygen via  to obtain SPO2 > 93%.  Lactic acidosis possible secondary to above Lactic acid 2.1; continue to monitor lactic acid  Hyperkalemia K+ 5.2; No EKG changes Continue to monitor K+ with morning labs  GERD Continue Protonix  Hypertension/Hyperlipidemia  Continue home meds when med rec is updated  Depression/anxiety Continue home meds when med rec is updated  Hyperglycemia secondary to type 2 diabetes mellitus Continue insulin sliding scale and hypoglycemia protocol  Normocytic anemia with pancytopenia Presents with neutropenia,  thrombocytopenia and low hemoglobin Currently stable with no sign of bleeding Continue to monitor CBC with morning labs  Elevated proBNP BNP elevated at 119 (this was transferred yesterday about 4 months ago) Patient does not appear fluid overloaded clinically.  Continue to monitor patient and treat accordingly.  DVT prophylaxis: Lovenox  Code Status: DNR  Family Communication: None at bedside  Disposition Plan: Discharge back to her living facility once clinically stable  Consults called: None  Admission status: Inpatient    Frankey Shown MD [facility]  If 7PM-7AM, please contact night-coverage [email or web address]  10/16/2019, 3:05 AM

## 2019-10-16 NOTE — Progress Notes (Signed)
Initial Nutrition Assessment  RD working remotely.  DOCUMENTATION CODES:   Not applicable  INTERVENTION:   - Liberalize diet to Regular, verbal with readback order placed per MD  - Ensure Enlive po BID, each supplement provides 350 kcal and 20 grams of protein  - Magic cup BID with lunch and dinner meals, each supplement provides 290 kcal and 9 grams of protein  NUTRITION DIAGNOSIS:   Increased nutrient needs related to acute illness (COVID-19 pneumonia) as evidenced by estimated needs.  GOAL:   Patient will meet greater than or equal to 90% of their needs  MONITOR:   PO intake, Supplement acceptance, Labs, Weight trends  REASON FOR ASSESSMENT:   Malnutrition Screening Tool    ASSESSMENT:   85 year old female who presented to the ED on 12/07 with SOB. PMH of COPD on supplemental oxygen, GERD, dementia, HTN, HLD, T2DM. Pt with positive COVID-19 test. Pt admitted with COVID-19 pneumonia.   Pt is on a Heart Healthy/Carb Modified diet. No meal completions recorded at this time.  Reviewed weight history in chart. Pt's weight has fluctuated between 76-80 kg over the last 5 months.  Discussed liberalizing with with MD who agreed. Will order oral nutrition supplements to aid pt in meeting kcal and protein needs.  Medications reviewed and include: decadron, SSI, protonix, vitamin C, zinc sulfate, remdesivir  Labs reviewed. CBG's: 105, 175   NUTRITION - FOCUSED PHYSICAL EXAM:  Unable to complete at this time. RD working remotely.  Diet Order:   Diet Order            Diet regular Room service appropriate? Yes; Fluid consistency: Thin  Diet effective now              EDUCATION NEEDS:   No education needs have been identified at this time  Skin:  Skin Assessment: Reviewed RN Assessment (MASD to buttocks)  Last BM:  no documented BM  Height:   Ht Readings from Last 1 Encounters:  10/16/19 5\' 5"  (1.651 m)    Weight:   Wt Readings from Last 1 Encounters:   10/16/19 76.3 kg    Ideal Body Weight:  56.8 kg  BMI:  Body mass index is 27.99 kg/m.  Estimated Nutritional Needs:   Kcal:  1600-1800  Protein:  80-95 grams  Fluid:  1.6-1.8 L    Gaynell Face, MS, RD, LDN Inpatient Clinical Dietitian Pager: [phone] Weekend/After Hours: [phone]

## 2019-10-16 NOTE — Progress Notes (Signed)
[name]  YKD:[id number] DOB: [birth year] DOA: 10/15/2019 PCP: Kirk Ruths, MD    Brief Narrative:  85 year old resident of an ALF with a history of COPD requiring 2 L/min supplemental oxygen at home, GERD, vascular dementia, HTN, HLD, and DM 2 who presented to the [facility] ED with shortness of breath over 24 hours duration.  She was known to have tested positive for Covid at her facility on 12/4.  In the ED her oxygen saturations were 93-100% on 2 L/min nasal cannula.  CXR noted a left perihilar infiltrate.  Significant Events: 12/4 Covid positive test at facility  COVID-19 specific Treatment: Remdesivir 12/7 > Decadron 12/7 >  Subjective: Oxygen saturations are presently stable on home 2 L nasal cannula. Pt reports she is feeling better overall. Denies cp, n/v, or abdom pain.   Assessment & Plan:  Covid pneumonia Clinically responding to present tx - monitor without actemra or convalescent plasma at this time due to clinical stability - stop decadron as pt is actually stable on her home O2 and therefore not actually acutely hypoxic - f/u CXR in AM   Recent Labs  Lab 10/15/19 0647 10/15/19 0855 10/16/19 0540  FERRITIN 289  --   --   CRP  --  3.2*  --   ALT  --   --  13  PROCALCITON <0.10  --   --     COPD with chronic hypoxic respiratory failure Currently stable on her home dose of 2 L/min nasal cannula  Mild lactic acidosis Resolved -likely due to transient respiratory distress versus dehydration  Hyperkalemia Corrected  Hypomagnesemia Likely due to poor intake -supplement and follow  GERD  HTN albuterol Well-controlled at this time  HLD Continue usual medical tx   Vascular dementia  DM2 uncontrolled with hyperglycemia CBGs improving - follow trend   DVT prophylaxis: Lovenox Code Status: NO CODE - DNR Family Communication:  Disposition Plan: med/surg   Consultants:  none  Antimicrobials:  Azithromycin 12/7 > Rocephin 12/7 >   Objective: Blood pressure (!) 132/51, pulse 79, temperature 98 F (36.7 C), temperature source Oral, resp. rate 20, height 5\' 5"  (1.651 m), weight 76.3 kg, SpO2 96 %.  Intake/Output Summary (Last 24 hours) at 10/16/2019 0821 Last data filed at 10/16/2019 0500 Gross per 24 hour  Intake 490 ml  Output -  Net 490 ml   Filed Weights   10/16/19 0017  Weight: 76.3 kg    Examination: General: No acute respiratory distress Lungs: fine scattered crackles - no wheezing  Cardiovascular: Regular rate and rhythm without murmur gallop or rub normal S1 and S2 Abdomen: Nontender, nondistended, soft, bowel sounds positive, no rebound, no ascites, no appreciable mass Extremities: No significant cyanosis, clubbing, or edema bilateral lower extremities  CBC: Recent Labs  Lab 10/15/19 0647 10/16/19 0540  WBC 3.8*  3.7* 3.4*  NEUTROABS 2.6 2.4  HGB 11.7*  11.6* 10.1*  HCT 38.5  38.6 34.2*  MCV 82.3  83.0 83.4  PLT 111*  111* 397*   Basic Metabolic Panel: Recent Labs  Lab 10/15/19 0647 10/16/19 0540  NA 140 141  K 5.2* 4.8  CL 112* 110  CO2 21* 21*  GLUCOSE 144* 129*  BUN 28* 26*  CREATININE 1.01* 0.78  CALCIUM 8.5* 8.7*  MG  --  1.7  PHOS  --  2.6   GFR: Estimated Creatinine Clearance: 52.5 mL/min (by C-G formula based on SCr of 0.78 mg/dL).  Liver Function Tests: Recent Labs  Lab 10/16/19  0540  AST 15  ALT 13  ALKPHOS 57  BILITOT 0.2*  PROT 6.3*  ALBUMIN 2.8*    HbA1C: Hgb A1c MFr Bld  Date/Time Value Ref Range Status  05/23/2019 05:16 PM 6.7 (H) 4.8 - 5.6 % Final    Comment:    (NOTE) Pre diabetes:          5.7%-6.4% Diabetes:              >6.4% Glycemic control for   <7.0% adults with diabetes      Recent Results (from the past 240 hour(s))  Blood Culture (routine x 2)     Status: None (Preliminary result)   Collection Time: 10/15/19  8:55 AM   Specimen: BLOOD  Result Value Ref Range Status   Specimen Description BLOOD LEFT AC  Final   Special  Requests   Final    BOTTLES DRAWN AEROBIC AND ANAEROBIC Blood Culture adequate volume   Culture   Final    NO GROWTH < 24 HOURS Performed at [facility], [address] [place], [place] [address]    Report Status PENDING  Incomplete  Blood Culture (routine x 2)     Status: None (Preliminary result)   Collection Time: 10/15/19  8:55 AM   Specimen: BLOOD  Result Value Ref Range Status   Specimen Description BLOOD LEFT HAND  Final   Special Requests   Final    BOTTLES DRAWN AEROBIC AND ANAEROBIC Blood Culture results may not be optimal due to an inadequate volume of blood received in culture bottles   Culture   Final    NO GROWTH < 24 HOURS Performed at [facility], [address] [place], [place] [address]    Report Status PENDING  Incomplete  MRSA PCR Screening     Status: None   Collection Time: 10/16/19  2:22 AM   Specimen: Nasal Mucosa; Nasopharyngeal  Result Value Ref Range Status   MRSA by PCR NEGATIVE NEGATIVE Final    Comment:        The GeneXpert MRSA Assay (FDA approved for NASAL specimens only), is one component of a comprehensive MRSA colonization surveillance program. It is not intended to diagnose MRSA infection nor to guide or monitor treatment for MRSA infections. Performed at [facility], [address] [place], [place] [address]      Scheduled Meds: . dexamethasone (DECADRON) injection  6 mg Intravenous Q24H  . dextromethorphan-guaiFENesin  1 tablet Oral BID  . enoxaparin (LOVENOX) injection  40 mg Subcutaneous Q24H  . insulin aspart  0-15 Units Subcutaneous TID WC  . insulin aspart  0-5 Units Subcutaneous QHS  . pantoprazole  40 mg Oral Daily  . vitamin C  500 mg Oral Daily  . zinc sulfate  220 mg Oral Daily   Continuous Infusions: . [START ON 10/17/2019] remdesivir 100 mg in NS 100 mL       LOS: 1 day   Lonia Blood, MD [facility] Office  [phone] Pager - Text Page per Amion  If  7PM-7AM, please contact night-coverage per Amion 10/16/2019, 8:21 AM

## 2019-10-17 ENCOUNTER — Inpatient Hospital Stay (HOSPITAL_COMMUNITY): Payer: Medicare Other

## 2019-10-17 DIAGNOSIS — I10 Essential (primary) hypertension: Secondary | ICD-10-CM

## 2019-10-17 DIAGNOSIS — K219 Gastro-esophageal reflux disease without esophagitis: Secondary | ICD-10-CM

## 2019-10-17 DIAGNOSIS — F419 Anxiety disorder, unspecified: Secondary | ICD-10-CM

## 2019-10-17 LAB — CBC WITH DIFFERENTIAL/PLATELET
Abs Immature Granulocytes: 0.03 K/uL (ref 0.00–0.07)
Basophils Absolute: 0 K/uL (ref 0.0–0.1)
Basophils Relative: 0 %
Eosinophils Absolute: 0 K/uL (ref 0.0–0.5)
Eosinophils Relative: 0 %
HCT: 39.9 % (ref 36.0–46.0)
Hemoglobin: 11.8 g/dL — ABNORMAL LOW (ref 12.0–15.0)
Immature Granulocytes: 1 %
Lymphocytes Relative: 16 %
Lymphs Abs: 0.8 K/uL (ref 0.7–4.0)
MCH: 25.1 pg — ABNORMAL LOW (ref 26.0–34.0)
MCHC: 29.6 g/dL — ABNORMAL LOW (ref 30.0–36.0)
MCV: 84.7 fL (ref 80.0–100.0)
Monocytes Absolute: 0.3 K/uL (ref 0.1–1.0)
Monocytes Relative: 7 %
Neutro Abs: 3.8 K/uL (ref 1.7–7.7)
Neutrophils Relative %: 76 %
Platelets: 185 K/uL (ref 150–400)
RBC: 4.71 MIL/uL (ref 3.87–5.11)
RDW: 15.6 % — ABNORMAL HIGH (ref 11.5–15.5)
WBC: 5 K/uL (ref 4.0–10.5)
nRBC: 0 % (ref 0.0–0.2)

## 2019-10-17 LAB — D-DIMER, QUANTITATIVE: D-Dimer, Quant: 2.5 ug/mL-FEU — ABNORMAL HIGH (ref 0.00–0.50)

## 2019-10-17 LAB — COMPREHENSIVE METABOLIC PANEL
ALT: 15 U/L (ref 0–44)
AST: 20 U/L (ref 15–41)
Albumin: 3.3 g/dL — ABNORMAL LOW (ref 3.5–5.0)
Alkaline Phosphatase: 64 U/L (ref 38–126)
Anion gap: 6 (ref 5–15)
BUN: 21 mg/dL (ref 8–23)
CO2: 28 mmol/L (ref 22–32)
Calcium: 9.2 mg/dL (ref 8.9–10.3)
Chloride: 106 mmol/L (ref 98–111)
Creatinine, Ser: 0.61 mg/dL (ref 0.44–1.00)
GFR calc Af Amer: >60 mL/min (ref >60)
GFR calc non Af Amer: >60 mL/min (ref >60)
Glucose, Bld: 124 mg/dL — ABNORMAL HIGH (ref 70–99)
Potassium: 4.5 mmol/L (ref 3.5–5.1)
Sodium: 140 mmol/L (ref 135–145)
Total Bilirubin: 0.5 mg/dL (ref 0.3–1.2)
Total Protein: 7.3 g/dL (ref 6.5–8.1)

## 2019-10-17 LAB — FERRITIN: Ferritin: 316 ng/mL — ABNORMAL HIGH (ref 11–307)

## 2019-10-17 LAB — C-REACTIVE PROTEIN: CRP: 1.7 mg/dL — ABNORMAL HIGH (ref <1.0)

## 2019-10-17 LAB — MAGNESIUM: Magnesium: 2.1 mg/dL (ref 1.7–2.4)

## 2019-10-17 LAB — GLUCOSE, CAPILLARY
Glucose-Capillary: 128 mg/dL — ABNORMAL HIGH (ref 70–99)
Glucose-Capillary: 124 mg/dL — ABNORMAL HIGH (ref 70–99)
Glucose-Capillary: 164 mg/dL — ABNORMAL HIGH (ref 70–99)
Glucose-Capillary: 131 mg/dL — ABNORMAL HIGH (ref 70–99)
Glucose-Capillary: 130 mg/dL — ABNORMAL HIGH (ref 70–99)

## 2019-10-17 MED ORDER — QUETIAPINE FUMARATE 25 MG PO TABS: 25.0000 mg | ORAL_TABLET | Freq: Every evening | ORAL | Status: DC | PRN

## 2019-10-17 NOTE — Progress Notes (Signed)
PROGRESS NOTE                                                                                                                                                                                                             Patient Demographics:    [name], is a 85 y.o. female, DOB - 1931/11/16, WUJ:[id number]  Outpatient Primary MD for the patient is Lauro Regulus, MD   Admit date - 10/15/2019   LOS - 2  No chief complaint on file.      Brief Narrative: Patient is a 85 y.o. female with PMHx of COPD on 2 L of oxygen at home, dementia, HTN, HLD, DM-2 who presented with shortness of breath-further evaluation revealed COVID-19 pneumonia.   Subjective:    [name] today    Assessment  & Plan :    Covid 19 Viral pneumonia: Stable on 2 L of oxygen (home regimen)-continue remdesivir and steroids.  Fever: afebrile  O2 requirements:  SpO2: 95 % O2 Flow Rate (L/min): 2 L/min   COVID-19 Labs: Recent Labs    10/15/19 0647 10/15/19 0855 10/16/19 0540 10/17/19 0435  DDIMER  --   --  2.30* 2.50*  FERRITIN 289  --  257 316*  LDH  --  176  --   --   CRP  --  3.2* 2.4* 1.7*       Component Value Date/Time   BNP 219.0 (H) 10/15/2019 0647    Recent Labs  Lab 10/15/19 0647  PROCALCITON <0.10    Lab Results  Component Value Date   SARSCOV2NAA NEGATIVE 05/23/2019     COVID-19 Medications: Remdesivir 12/7 > Decadron 12/7 >  Other medications: Diuretics:Euvolemic-on oral Lasix to maintain negative balance Antibiotics:Not needed as no evidence of bacterial infection   Prone/Incentive Spirometry: encouraged incentive spirometry use 3-4/hour. DVT Prophylaxis  :  Lovenox   HTN: Controlled-continue amlodipine, Lasix metoprolol  DM-2 (A1c 7.0): CBGs stable with SSI  CBG (last 3)  Recent Labs    10/16/19 2106 10/17/19 0731 10/17/19 1128  GLUCAP 134* 124* 164*   GERD: Continue PPI  COPD with  chronic hypoxic respiratory failure: Continue bronchodilators-stable on 2 L of oxygen.  Vascular dementia: Expect delirium-continue Depakote  Nutrition Problem: Nutrition Problem: Increased nutrient needs Etiology: acute illness(COVID-19 pneumonia) Signs/Symptoms: estimated needs Interventions: Ensure Enlive (each supplement provides 350kcal  and 20 grams of protein), Liberalize Diet, Magic cup  Consults  :  None  Procedures  :  None  ABG:    Component Value Date/Time   PHART 7.39 08/15/2018 1127   PCO2ART 48 08/15/2018 1127   PO2ART 75 (L) 08/15/2018 1127   HCO3 31.8 (H) 06/06/2019 1859   O2SAT 18.0 06/06/2019 1859    Vent Settings: N/A   Condition - Stable  Family Communication  :  Son updated over the phone  Code Status :  Full Code  Diet :  Diet Order            Diet regular Room service appropriate? Yes; Fluid consistency: Thin  Diet effective now               Disposition Plan  :  Remain hospitalized  Barriers to discharge: Hypoxia requiring O2 supplementation/complete 5 days of IV Remdesivir  Antimicorbials  :    Anti-infectives (From admission, onward)   Start     Dose/Rate Route Frequency Ordered Stop   10/17/19 1000  remdesivir 100 mg in sodium chloride 0.9 % 100 mL IVPB  Status:  Discontinued     100 mg 200 mL/hr over 30 Minutes Intravenous Daily 10/16/19 0311 10/16/19 1904   10/17/19 0400  remdesivir 100 mg in sodium chloride 0.9 % 100 mL IVPB     100 mg 200 mL/hr over 30 Minutes Intravenous Daily 10/16/19 1906 10/21/19 0959   10/16/19 2000  remdesivir 100 mg in sodium chloride 0.9 % 100 mL IVPB  Status:  Discontinued     100 mg 200 mL/hr over 30 Minutes Intravenous Daily 10/16/19 1904 10/16/19 1906   10/16/19 0400  remdesivir 200 mg in sodium chloride 0.9% 250 mL IVPB     200 mg 580 mL/hr over 30 Minutes Intravenous Once 10/16/19 0311 10/16/19 0442      Inpatient Medications  Scheduled Meds: . amLODipine  5 mg Oral Daily  . aspirin EC   81 mg Oral Daily  . cholecalciferol  2,000 Units Oral Daily  . dextromethorphan-guaiFENesin  1 tablet Oral BID  . dicyclomine  20 mg Oral QID  . divalproex  250 mg Oral QHS  . enoxaparin (LOVENOX) injection  40 mg Subcutaneous Q24H  . feeding supplement (ENSURE ENLIVE)  237 mL Oral BID BM  . furosemide  20 mg Oral Daily  . insulin aspart  0-15 Units Subcutaneous TID WC  . insulin aspart  0-5 Units Subcutaneous QHS  . metoprolol tartrate  25 mg Oral BID  . multivitamin with minerals  1 tablet Oral Daily  . olopatadine  1 drop Both Eyes Daily  . pantoprazole  40 mg Oral Daily  . vitamin C  500 mg Oral Daily  . zinc sulfate  220 mg Oral Daily   Continuous Infusions: . remdesivir 100 mg in NS 100 mL Stopped (10/17/19 0454)   PRN Meds:.acetaminophen, albuterol, alum & mag hydroxide-simeth, bismuth subsalicylate, chlorpheniramine-HYDROcodone, LORazepam, ondansetron **OR** ondansetron (ZOFRAN) IV   Time Spent in minutes  25  See all Orders from today for further details   Oren Binet M.D on 10/17/2019 at 1:13 PM  To page go to [email or web address] - use universal password  [facility] -  Office  [phone]    Objective:   Vitals:   10/16/19 1600 10/16/19 1939 10/17/19 0415 10/17/19 0733  BP: (!) 131/57 (!) 163/65 (!) 165/82 (!) 145/55  Pulse: 91 89 99   Resp: 20 17 18 18   Temp: 98.1 F (36.7  C) 98 F (36.7 C) 98.4 F (36.9 C) 100.2 F (37.9 C)  TempSrc: Oral Oral Axillary Oral  SpO2: 100% 99% 95%   Weight:      Height:        Wt Readings from Last 3 Encounters:  10/16/19 76.3 kg  10/15/19 76.3 kg  06/06/19 80 kg     Intake/Output Summary (Last 24 hours) at 10/17/2019 1313 Last data filed at 10/17/2019 0454 Gross per 24 hour  Intake 400 ml  Output 300 ml  Net 100 ml     Physical Exam Gen Exam:Alert-not in any distress HEENT:atraumatic, normocephalic Chest: B/L clear to auscultation anteriorly CVS:S1S2 regular Abdomen:soft non tender, non distended  Extremities:no edema Neurology: Non focal Skin: no rash   Data Review:    CBC Recent Labs  Lab 10/15/19 0647 10/16/19 0540 10/17/19 0435  WBC 3.8*  3.7* 3.4* 5.0  HGB 11.7*  11.6* 10.1* 11.8*  HCT 38.5  38.6 34.2* 39.9  PLT 111*  111* 122* 185  MCV 82.3  83.0 83.4 84.7  MCH 25.0*  24.9* 24.6* 25.1*  MCHC 30.4  30.1 29.5* 29.6*  RDW 15.7*  15.8* 15.4 15.6*  LYMPHSABS 0.8 0.7 0.8  MONOABS 0.3 0.3 0.3  EOSABS 0.0 0.0 0.0  BASOSABS 0.0 0.0 0.0    Chemistries  Recent Labs  Lab 10/15/19 0647 10/16/19 0540 10/17/19 0435  NA 140 141 140  K 5.2* 4.8 4.5  CL 112* 110 106  CO2 21* 21* 28  GLUCOSE 144* 129* 124*  BUN 28* 26* 21  CREATININE 1.01* 0.78 0.61  CALCIUM 8.5* 8.7* 9.2  MG  --  1.7 2.1  AST  --  15 20  ALT  --  13 15  ALKPHOS  --  57 64  BILITOT  --  0.2* 0.5   ------------------------------------------------------------------------------------------------------------------ Recent Labs    10/15/19 0647  TRIG 97    Lab Results  Component Value Date   HGBA1C 7.0 (H) 10/16/2019   ------------------------------------------------------------------------------------------------------------------ No results for input(s): TSH, T4TOTAL, T3FREE, THYROIDAB in the last 72 hours.  Invalid input(s): FREET3 ------------------------------------------------------------------------------------------------------------------ Recent Labs    10/16/19 0540 10/17/19 0435  FERRITIN 257 316*    Coagulation profile No results for input(s): INR, PROTIME in the last 168 hours.  Recent Labs    10/16/19 0540 10/17/19 0435  DDIMER 2.30* 2.50*    Cardiac Enzymes No results for input(s): CKMB, TROPONINI, MYOGLOBIN in the last 168 hours.  Invalid input(s): CK ------------------------------------------------------------------------------------------------------------------    Component Value Date/Time   BNP 219.0 (H) 10/15/2019 6045    Micro Results Recent  Results (from the past 240 hour(s))  Blood Culture (routine x 2)     Status: None (Preliminary result)   Collection Time: 10/15/19  8:55 AM   Specimen: BLOOD  Result Value Ref Range Status   Specimen Description BLOOD LEFT [facility]  Final   Special Requests   Final    BOTTLES DRAWN AEROBIC AND ANAEROBIC Blood Culture adequate volume   Culture   Final    NO GROWTH 2 DAYS Performed at [facility], [address] [place], [place] [address]    Report Status PENDING  Incomplete  Blood Culture (routine x 2)     Status: None (Preliminary result)   Collection Time: 10/15/19  8:55 AM   Specimen: BLOOD  Result Value Ref Range Status   Specimen Description BLOOD LEFT HAND  Final   Special Requests   Final    BOTTLES DRAWN AEROBIC AND ANAEROBIC  Blood Culture results may not be optimal due to an inadequate volume of blood received in culture bottles   Culture   Final    NO GROWTH 2 DAYS Performed at [facility], [address] [place], [place] [address]    Report Status PENDING  Incomplete  [facility]     Status: None   Collection Time: 10/16/19  2:22 AM   Specimen: Nasal Mucosa; Nasopharyngeal  Result Value Ref Range Status   MRSA by PCR NEGATIVE NEGATIVE Final    Comment:        The GeneXpert MRSA Assay (FDA approved for NASAL specimens only), is one component of a comprehensive MRSA colonization surveillance program. It is not intended to diagnose MRSA infection nor to guide or monitor treatment for MRSA infections. Performed at [facility], [address] [place], [place] [address]     Radiology Reports Dg Chest Port 1 View  Result Date: 10/17/2019 CLINICAL DATA:  COVID-19 pneumonia EXAM: PORTABLE CHEST 1 VIEW COMPARISON:  10/15/2019 FINDINGS: Midline trachea. Mild cardiomegaly. Atherosclerosis in the transverse aorta. No pleural effusion or pneumothorax. Patchy left greater than right airspace disease is not significantly changed.  Left perihilar and infrahilar. Medial right upper lobe and lateral right lung base. IMPRESSION: No significant change in left greater than right multifocal pneumonia. Aortic Atherosclerosis (ICD10-I70.0). Electronically Signed   By: Jeronimo GreavesKyle  Talbot M.D.   On: 10/17/2019 08:28   Dg Chest Portable 1 View  Result Date: 10/15/2019 CLINICAL DATA:  Shortness of breath.  COVID-19 positive EXAM: PORTABLE CHEST 1 VIEW COMPARISON:  06/06/2019 FINDINGS: Left perihilar airspace disease. Cardiomegaly accentuated by leftward rotation. Surgical clips over the right mediastinum which are posterior mediastinal by 05/2019 CT. Hyperinflation. Remote right humeral neck fracture IMPRESSION: Left perihilar pneumonia. Electronically Signed   By: Marnee SpringJonathon  Watts M.D.   On: 10/15/2019 07:08

## 2019-10-18 LAB — CBC WITH DIFFERENTIAL/PLATELET
Abs Immature Granulocytes: 0.46 10*3/uL — ABNORMAL HIGH (ref 0.00–0.07)
Basophils Absolute: 0.1 10*3/uL (ref 0.0–0.1)
Basophils Relative: 1 %
Eosinophils Absolute: 0 10*3/uL (ref 0.0–0.5)
Eosinophils Relative: 0 %
HCT: 42.6 % (ref 36.0–46.0)
Hemoglobin: 13.8 g/dL (ref 12.0–15.0)
Immature Granulocytes: 4 %
Lymphocytes Relative: 7 %
Lymphs Abs: 0.8 10*3/uL (ref 0.7–4.0)
MCH: 30.1 pg (ref 26.0–34.0)
MCHC: 32.4 g/dL (ref 30.0–36.0)
MCV: 93 fL (ref 80.0–100.0)
Monocytes Absolute: 0.8 10*3/uL (ref 0.1–1.0)
Monocytes Relative: 7 %
Neutro Abs: 9.2 10*3/uL — ABNORMAL HIGH (ref 1.7–7.7)
Neutrophils Relative %: 81 %
Platelets: 297 10*3/uL (ref 150–400)
RBC: 4.58 MIL/uL (ref 3.87–5.11)
RDW: 13.8 % (ref 11.5–15.5)
WBC: 11.3 10*3/uL — ABNORMAL HIGH (ref 4.0–10.5)
nRBC: 0.5 % — ABNORMAL HIGH (ref 0.0–0.2)

## 2019-10-18 LAB — COMPREHENSIVE METABOLIC PANEL
ALT: 15 U/L (ref 0–44)
AST: 22 U/L (ref 15–41)
Albumin: 4.2 g/dL (ref 3.5–5.0)
Alkaline Phosphatase: 83 U/L (ref 38–126)
Anion gap: 15 (ref 5–15)
BUN: 59 mg/dL — ABNORMAL HIGH (ref 8–23)
CO2: 31 mmol/L (ref 22–32)
Calcium: 9.6 mg/dL (ref 8.9–10.3)
Chloride: 91 mmol/L — ABNORMAL LOW (ref 98–111)
Creatinine, Ser: 1.11 mg/dL — ABNORMAL HIGH (ref 0.44–1.00)
GFR calc Af Amer: 52 mL/min — ABNORMAL LOW (ref >60)
GFR calc non Af Amer: 45 mL/min — ABNORMAL LOW (ref >60)
Glucose, Bld: 180 mg/dL — ABNORMAL HIGH (ref 70–99)
Potassium: 4.7 mmol/L (ref 3.5–5.1)
Sodium: 137 mmol/L (ref 135–145)
Total Bilirubin: 1.3 mg/dL — ABNORMAL HIGH (ref 0.3–1.2)
Total Protein: 7.6 g/dL (ref 6.5–8.1)

## 2019-10-18 LAB — FERRITIN: Ferritin: 196 ng/mL (ref 11–307)

## 2019-10-18 LAB — C-REACTIVE PROTEIN: CRP: 0.8 mg/dL (ref <1.0)

## 2019-10-18 LAB — D-DIMER, QUANTITATIVE: D-Dimer, Quant: 0.49 ug/mL-FEU (ref 0.00–0.50)

## 2019-10-18 LAB — GLUCOSE, CAPILLARY
Glucose-Capillary: 128 mg/dL — ABNORMAL HIGH (ref 70–99)
Glucose-Capillary: 192 mg/dL — ABNORMAL HIGH (ref 70–99)
Glucose-Capillary: 178 mg/dL — ABNORMAL HIGH (ref 70–99)
Glucose-Capillary: 107 mg/dL — ABNORMAL HIGH (ref 70–99)

## 2019-10-18 MED ORDER — QUETIAPINE FUMARATE 25 MG PO TABS
25.0000 mg | ORAL_TABLET | Freq: Two times a day (BID) | ORAL | Status: DC | PRN
  Administered 2019-10-18 – 2019-10-21 (×4): 25 mg via ORAL
  Filled 2019-10-18 (×4): qty 1

## 2019-10-18 MED ORDER — PROMETHAZINE HCL 25 MG/ML IJ SOLN
12.5000 mg | Freq: Four times a day (QID) | INTRAMUSCULAR | Status: DC | PRN
  Administered 2019-10-20: 12.5 mg via INTRAVENOUS
  Filled 2019-10-18: qty 1

## 2019-10-18 NOTE — Progress Notes (Signed)
PROGRESS NOTE                                                                                                                                                                                                             Patient Demographics:    [name], is a 85 y.o. female, DOB - 1932/08/17, [id number]  Outpatient Primary MD for the patient is Kirk Ruths, MD   Admit date - 10/15/2019   LOS - 3  No chief complaint on file.      Brief Narrative: Patient is a 85 y.o. female with PMHx of COPD on 2 L of oxygen at home, dementia, HTN, HLD, DM-2 who presented with shortness of breath-further evaluation revealed COVID-19 pneumonia.   Subjective:    [name] today    Assessment  & Plan :    Covid 19 Viral pneumonia: Remains stable-on 1 L of oxygen this morning-at home she is on 2 L at baseline.  Continue steroids and remdesivir.  Fever: afebrile  O2 requirements:  SpO2: 92 % O2 Flow Rate (L/min): 1 L/min   COVID-19 Labs: Recent Labs    10/16/19 0540 10/17/19 0435 10/18/19 0330  DDIMER 2.30* 2.50* 0.49  FERRITIN 257 316* 196  CRP 2.4* 1.7* 0.8       Component Value Date/Time   BNP 219.0 (H) 10/15/2019 0647    Recent Labs  Lab 10/15/19 0647  PROCALCITON <0.10    Lab Results  Component Value Date   SARSCOV2NAA NEGATIVE 05/23/2019     COVID-19 Medications: Remdesivir 12/7 > Decadron 12/7 >  Other medications: Diuretics:Euvolemic-on oral Lasix to maintain negative balance Antibiotics:Not needed as no evidence of bacterial infection   Prone/Incentive Spirometry: encouraged incentive spirometry use 3-4/hour. DVT Prophylaxis  :  Lovenox   HTN: Relatively well-controlled-continue amlodipine, Lasix and metoprolol.   DM-2 (A1c 7.0): CBGs stable with SSI  CBG (last 3)  Recent Labs    10/17/19 1942 10/18/19 0900 10/18/19 1109  GLUCAP 130* 128* 192*   GERD: Continue PPI  COPD  with chronic hypoxic respiratory failure: Continue bronchodilators-stable on 2 L of oxygen.  Vascular dementia: Expect delirium-continue Depakote  Nutrition Problem: Nutrition Problem: Increased nutrient needs Etiology: acute illness(COVID-19 pneumonia) Signs/Symptoms: estimated needs Interventions: Ensure Enlive (each supplement provides 350kcal and 20 grams of protein), Liberalize Diet, Magic cup  Consults  :  None  Procedures  :  None  ABG:    Component Value Date/Time   PHART 7.39 08/15/2018 1127   PCO2ART 48 08/15/2018 1127   PO2ART 75 (L) 08/15/2018 1127   HCO3 31.8 (H) 06/06/2019 1859   O2SAT 18.0 06/06/2019 1859    Vent Settings: N/A   Condition - Stable  Family Communication  :  Son updated over the phone on 12/10  Code Status :  Full Code  Diet :  Diet Order            Diet regular Room service appropriate? Yes; Fluid consistency: Thin  Diet effective now               Disposition Plan  :  Remain hospitalized  Barriers to discharge: Hypoxia requiring O2 supplementation/complete 5 days of IV Remdesivir  Antimicorbials  :    Anti-infectives (From admission, onward)   Start     Dose/Rate Route Frequency Ordered Stop   10/17/19 1000  remdesivir 100 mg in sodium chloride 0.9 % 100 mL IVPB  Status:  Discontinued     100 mg 200 mL/hr over 30 Minutes Intravenous Daily 10/16/19 0311 10/16/19 1904   10/17/19 0400  remdesivir 100 mg in sodium chloride 0.9 % 100 mL IVPB     100 mg 200 mL/hr over 30 Minutes Intravenous Daily 10/16/19 1906 10/21/19 0959   10/16/19 2000  remdesivir 100 mg in sodium chloride 0.9 % 100 mL IVPB  Status:  Discontinued     100 mg 200 mL/hr over 30 Minutes Intravenous Daily 10/16/19 1904 10/16/19 1906   10/16/19 0400  remdesivir 200 mg in sodium chloride 0.9% 250 mL IVPB     200 mg 580 mL/hr over 30 Minutes Intravenous Once 10/16/19 0311 10/16/19 0442      Inpatient Medications  Scheduled Meds: . amLODipine  5 mg Oral Daily    . aspirin EC  81 mg Oral Daily  . cholecalciferol  2,000 Units Oral Daily  . dextromethorphan-guaiFENesin  1 tablet Oral BID  . dicyclomine  20 mg Oral QID  . divalproex  250 mg Oral QHS  . enoxaparin (LOVENOX) injection  40 mg Subcutaneous Q24H  . feeding supplement (ENSURE ENLIVE)  237 mL Oral BID BM  . furosemide  20 mg Oral Daily  . insulin aspart  0-15 Units Subcutaneous TID WC  . insulin aspart  0-5 Units Subcutaneous QHS  . metoprolol tartrate  25 mg Oral BID  . multivitamin with minerals  1 tablet Oral Daily  . olopatadine  1 drop Both Eyes Daily  . pantoprazole  40 mg Oral Daily  . vitamin C  500 mg Oral Daily  . zinc sulfate  220 mg Oral Daily   Continuous Infusions: . remdesivir 100 mg in NS 100 mL 100 mg (10/18/19 0949)   PRN Meds:.acetaminophen, albuterol, alum & mag hydroxide-simeth, bismuth subsalicylate, chlorpheniramine-HYDROcodone, LORazepam, ondansetron **OR** ondansetron (ZOFRAN) IV, promethazine, QUEtiapine   Time Spent in minutes  25  See all Orders from today for further details   Jeoffrey Massed M.D on 10/18/2019 at 2:05 PM  To page go to [email or web address] - use universal password  [facility] -  Office  [phone]    Objective:   Vitals:   10/18/19 0000 10/18/19 0447 10/18/19 0448 10/18/19 0910  BP:  (!) 133/47  (!) 153/47  Pulse:  81 82 87  Resp:  19  18  Temp:  99 F (37.2 C)  99.1 F (37.3 C)  TempSrc:  Oral  Oral  SpO2: 95% 90% 90% 92%  Weight:      Height:        Wt Readings from Last 3 Encounters:  10/16/19 76.3 kg  10/15/19 76.3 kg  06/06/19 80 kg     Intake/Output Summary (Last 24 hours) at 10/18/2019 1405 Last data filed at 10/18/2019 0500 Gross per 24 hour  Intake 0 ml  Output 300 ml  Net -300 ml     Physical Exam Gen Exam:Alert awake-not in any distress HEENT:atraumatic, normocephalic Chest: B/L clear to auscultation anteriorly CVS:S1S2 regular Abdomen:soft non tender, non distended Extremities:no  edema Neurology: Non focal Skin: no rash   Data Review:    CBC Recent Labs  Lab 10/15/19 0647 10/16/19 0540 10/17/19 0435 10/18/19 0330  WBC 3.8*  3.7* 3.4* 5.0 11.3*  HGB 11.7*  11.6* 10.1* 11.8* 13.8  HCT 38.5  38.6 34.2* 39.9 42.6  PLT 111*  111* 122* 185 297  MCV 82.3  83.0 83.4 84.7 93.0  MCH 25.0*  24.9* 24.6* 25.1* 30.1  MCHC 30.4  30.1 29.5* 29.6* 32.4  RDW 15.7*  15.8* 15.4 15.6* 13.8  LYMPHSABS 0.8 0.7 0.8 0.8  MONOABS 0.3 0.3 0.3 0.8  EOSABS 0.0 0.0 0.0 0.0  BASOSABS 0.0 0.0 0.0 0.1    Chemistries  Recent Labs  Lab 10/15/19 0647 10/16/19 0540 10/17/19 0435 10/18/19 0330  NA 140 141 140 137  K 5.2* 4.8 4.5 4.7  CL 112* 110 106 91*  CO2 21* 21* 28 31  GLUCOSE 144* 129* 124* 180*  BUN 28* 26* 21 59*  CREATININE 1.01* 0.78 0.61 1.11*  CALCIUM 8.5* 8.7* 9.2 9.6  MG  --  1.7 2.1  --   AST  --  ALT  --  ALKPHOS  --  57 64 83  BILITOT  --  0.2* 0.5 1.3*   ------------------------------------------------------------------------------------------------------------------ No results for input(s): CHOL, HDL, LDLCALC, TRIG, CHOLHDL, LDLDIRECT in the last 72 hours.  Lab Results  Component Value Date   HGBA1C 7.0 (H) 10/16/2019   ------------------------------------------------------------------------------------------------------------------ No results for input(s): TSH, T4TOTAL, T3FREE, THYROIDAB in the last 72 hours.  Invalid input(s): FREET3 ------------------------------------------------------------------------------------------------------------------ Recent Labs    10/17/19 0435 10/18/19 0330  FERRITIN 316* 196    Coagulation profile No results for input(s): INR, PROTIME in the last 168 hours.  Recent Labs    10/17/19 0435 10/18/19 0330  DDIMER 2.50* 0.49    Cardiac Enzymes No results for input(s): CKMB, TROPONINI, MYOGLOBIN in the last 168 hours.  Invalid input(s):  CK ------------------------------------------------------------------------------------------------------------------    Component Value Date/Time   BNP 219.0 (H) 10/15/2019 7829    Micro Results Recent Results (from the past 240 hour(s))  Blood Culture (routine x 2)     Status: None (Preliminary result)   Collection Time: 10/15/19  8:55 AM   Specimen: BLOOD  Result Value Ref Range Status   Specimen Description BLOOD LEFT [facility]  Final   Special Requests   Final    BOTTLES DRAWN AEROBIC AND ANAEROBIC Blood Culture adequate volume   Culture   Final    NO GROWTH 3 DAYS Performed at [facility], [address] [place], [place] [address]    Report Status PENDING  Incomplete  Blood Culture (routine x 2)     Status: None (Preliminary result)   Collection Time: 10/15/19  8:55 AM   Specimen: BLOOD  Result Value Ref Range Status   Specimen Description BLOOD  LEFT HAND  Final   Special Requests   Final    BOTTLES DRAWN AEROBIC AND ANAEROBIC Blood Culture results may not be optimal due to an inadequate volume of blood received in culture bottles   Culture   Final    NO GROWTH 3 DAYS Performed at [facility], [address] [place], [place] [address]    Report Status PENDING  Incomplete  MRSA PCR Screening     Status: None   Collection Time: 10/16/19  2:22 AM   Specimen: Nasal Mucosa; Nasopharyngeal  Result Value Ref Range Status   MRSA by PCR NEGATIVE NEGATIVE Final    Comment:        The GeneXpert MRSA Assay (FDA approved for NASAL specimens only), is one component of a comprehensive MRSA colonization surveillance program. It is not intended to diagnose MRSA infection nor to guide or monitor treatment for MRSA infections. Performed at [facility], [address] [place], [place] [address]     Radiology Reports DG Chest Port 1 View  Result Date: 10/17/2019 CLINICAL DATA:  COVID-19 pneumonia EXAM: PORTABLE CHEST 1 VIEW COMPARISON:   10/15/2019 FINDINGS: Midline trachea. Mild cardiomegaly. Atherosclerosis in the transverse aorta. No pleural effusion or pneumothorax. Patchy left greater than right airspace disease is not significantly changed. Left perihilar and infrahilar. Medial right upper lobe and lateral right lung base. IMPRESSION: No significant change in left greater than right multifocal pneumonia. Aortic Atherosclerosis (ICD10-I70.0). Electronically Signed   By: Jeronimo GreavesKyle  Talbot M.D.   On: 10/17/2019 08:28   DG Chest Portable 1 View  Result Date: 10/15/2019 CLINICAL DATA:  Shortness of breath.  COVID-19 positive EXAM: PORTABLE CHEST 1 VIEW COMPARISON:  06/06/2019 FINDINGS: Left perihilar airspace disease. Cardiomegaly accentuated by leftward rotation. Surgical clips over the right mediastinum which are posterior mediastinal by 05/2019 CT. Hyperinflation. Remote right humeral neck fracture IMPRESSION: Left perihilar pneumonia. Electronically Signed   By: Marnee SpringJonathon  Watts M.D.   On: 10/15/2019 07:08

## 2019-10-18 NOTE — Evaluation (Signed)
Physical Therapy Evaluation Patient Details Name: [name] MRN: [id number] DOB: 06/21/1932 Today's Date: 10/18/2019   History of Present Illness  85 year old resident of an ALF with a history of COPD requiring 2 L/min supplemental oxygen at home, GERD, vascular dementia, HTN, HLD, and DM 2 who presented to the [facility] ED with shortness of breath over 24 hours duration.  She was known to have tested positive for Covid at her facility on 12/4.  In the ED her oxygen saturations were 93-100% on 2 L/min nasal cannula.  CXR noted a left perihilar infiltrate.  Clinical Impression  The patient requires mod assistance for bed mobility and max to transfer to [facility]. Unsure of Prior level of function but appears that she was not very ambulatory. Patient comes from ALF. Patient on 2 L [place] with SPO2 >925. [name] admitted with above diagnosis.   [name] currently with functional limitations due to the deficits listed below (see [name] Problem List). [name] will benefit from skilled [name] to increase their independence and safety with mobility to allow discharge to the venue listed below.       Follow Up Recommendations Home health [name](return to ALF)    Equipment Recommendations  None recommended by [name]    Recommendations for Other Services       Precautions / Restrictions Precautions Precautions: Fall Precaution Comments: incontinence      Mobility  Bed Mobility Overal bed mobility: Needs Assistance Bed Mobility: Rolling;Sidelying to Sit Rolling: Mod assist Sidelying to sit: Mod assist       General bed mobility comments: assist with legs and trunk, used bed pad to scoot to bed edge, patient did rock a little to scoot  Transfers Overall transfer level: Needs assistance   Transfers: Sit to/from [employer] Transfers Sit to Stand: Max assist   Squat pivot transfers: Max assist     General transfer comment: bear hug with belt to squat pivot to BSC then to bed. barely clears  buttiocks  Ambulation/Gait                Stairs            Wheelchair Mobility    Modified Rankin (Stroke Patients Only)       Balance Overall balance assessment: Needs assistance Sitting-balance support: Feet supported;Bilateral upper extremity supported Sitting balance-Leahy Scale: Fair     Standing balance support: During functional activity;Bilateral upper extremity supported Standing balance-Leahy Scale: Poor                               Pertinent Vitals/Pain Pain Assessment: Faces Faces Pain Scale: Hurts little more Pain Location: when assisting to transfer Pain Descriptors / Indicators: Discomfort;Moaning Pain Intervention(s): Monitored during session    Home Living Family/patient expects to be discharged to:: Assisted living                 Additional Comments: she ambulated  4 months ago, does not look to being able now    Prior Function Level of Independence: Needs assistance   Gait / Transfers Assistance Needed: [name]. unable to provide information           Hand Dominance        Extremity/Trunk Assessment   Upper Extremity Assessment Upper Extremity Assessment: Defer to OT evaluation    Lower Extremity Assessment Lower Extremity Assessment: Generalized weakness    Cervical / Trunk Assessment Cervical / Trunk Assessment: Kyphotic  Communication  Communication: No difficulties  Cognition Arousal/Alertness: Awake/alert Behavior During Therapy: WFL for tasks assessed/performed Overall Cognitive Status: Impaired/Different from baseline Area of Impairment: Orientation;Memory                 Orientation Level: Place;Time;Situation             General Comments: able to follow simple commands, talkative      General Comments      Exercises     Assessment/Plan    [name] Assessment Patient needs continued [name] services  [name] Problem List Decreased strength;Decreased mobility;Decreased safety  awareness;Decreased knowledge of precautions;Decreased activity tolerance;Decreased cognition       [name] Treatment Interventions DME instruction;Therapeutic activities;Therapeutic exercise;Patient/family education;Functional mobility training    [name] Goals (Current goals can be found in the Care Plan section)  Acute Rehab [name] Goals Patient Stated Goal: needs to potty [name] Goal Formulation: Patient unable to participate in goal setting Time For Goal Achievement: 11/01/19 Potential to Achieve Goals: Fair    Frequency Min 2X/week   Barriers to discharge        Co-evaluation               AM-PAC [name] "6 Clicks" Mobility  Outcome Measure Help needed turning from your back to your side while in a flat bed without using bedrails?: A Lot Help needed moving from lying on your back to sitting on the side of a flat bed without using bedrails?: A Lot Help needed moving to and from a bed to a chair (including a wheelchair)?: Total Help needed standing up from a chair using your arms (e.g., wheelchair or bedside chair)?: Total Help needed to walk in hospital room?: Total Help needed climbing 3-5 steps with a railing? : Total 6 Click Score: 8    End of Session Equipment Utilized During Treatment: Gait belt;Oxygen Activity Tolerance: Patient limited by fatigue Patient left: in bed;with call bell/phone within reach;with bed alarm set Nurse Communication: Mobility status [name] Visit Diagnosis: Unsteadiness on feet (R26.81)    Time: 1009-1100 [name] Time Calculation (min) (ACUTE ONLY): 51 min   Charges:   [name] Evaluation $[name] Eval Moderate Complexity: 1 Mod [name] Treatments $Therapeutic Activity: 23-37 mins        Tresa Endo [name] Acute Rehabilitation Services Pager [phone] Office [phone]   Claretha Cooper 10/18/2019, 1:27 PM

## 2019-10-19 LAB — CBC WITH DIFFERENTIAL/PLATELET
Abs Immature Granulocytes: 0.02 10*3/uL (ref 0.00–0.07)
Basophils Absolute: 0 10*3/uL (ref 0.0–0.1)
Basophils Relative: 1 %
Eosinophils Absolute: 0 10*3/uL (ref 0.0–0.5)
Eosinophils Relative: 1 %
HCT: 37.8 % (ref 36.0–46.0)
Hemoglobin: 10.8 g/dL — ABNORMAL LOW (ref 12.0–15.0)
Immature Granulocytes: 1 %
Lymphocytes Relative: 24 %
Lymphs Abs: 0.9 10*3/uL (ref 0.7–4.0)
MCH: 25.1 pg — ABNORMAL LOW (ref 26.0–34.0)
MCHC: 28.6 g/dL — ABNORMAL LOW (ref 30.0–36.0)
MCV: 87.7 fL (ref 80.0–100.0)
Monocytes Absolute: 0.3 10*3/uL (ref 0.1–1.0)
Monocytes Relative: 7 %
Neutro Abs: 2.5 10*3/uL (ref 1.7–7.7)
Neutrophils Relative %: 66 %
Platelets: 149 10*3/uL — ABNORMAL LOW (ref 150–400)
RBC: 4.31 MIL/uL (ref 3.87–5.11)
RDW: 15.4 % (ref 11.5–15.5)
WBC: 3.7 10*3/uL — ABNORMAL LOW (ref 4.0–10.5)
nRBC: 0 % (ref 0.0–0.2)

## 2019-10-19 LAB — COMPREHENSIVE METABOLIC PANEL
ALT: 13 U/L (ref 0–44)
AST: 16 U/L (ref 15–41)
Albumin: 2.8 g/dL — ABNORMAL LOW (ref 3.5–5.0)
Alkaline Phosphatase: 54 U/L (ref 38–126)
Anion gap: 9 (ref 5–15)
BUN: 36 mg/dL — ABNORMAL HIGH (ref 8–23)
CO2: 28 mmol/L (ref 22–32)
Calcium: 8.7 mg/dL — ABNORMAL LOW (ref 8.9–10.3)
Chloride: 108 mmol/L (ref 98–111)
Creatinine, Ser: 0.86 mg/dL (ref 0.44–1.00)
GFR calc Af Amer: >60 mL/min (ref >60)
GFR calc non Af Amer: >60 mL/min (ref >60)
Glucose, Bld: 116 mg/dL — ABNORMAL HIGH (ref 70–99)
Potassium: 5 mmol/L (ref 3.5–5.1)
Sodium: 145 mmol/L (ref 135–145)
Total Bilirubin: 0.8 mg/dL (ref 0.3–1.2)
Total Protein: 6.3 g/dL — ABNORMAL LOW (ref 6.5–8.1)

## 2019-10-19 LAB — FERRITIN: Ferritin: 301 ng/mL (ref 11–307)

## 2019-10-19 LAB — C-REACTIVE PROTEIN: CRP: 5.8 mg/dL — ABNORMAL HIGH (ref <1.0)

## 2019-10-19 LAB — GLUCOSE, CAPILLARY
Glucose-Capillary: 116 mg/dL — ABNORMAL HIGH (ref 70–99)
Glucose-Capillary: 164 mg/dL — ABNORMAL HIGH (ref 70–99)
Glucose-Capillary: 183 mg/dL — ABNORMAL HIGH (ref 70–99)
Glucose-Capillary: 165 mg/dL — ABNORMAL HIGH (ref 70–99)

## 2019-10-19 LAB — D-DIMER, QUANTITATIVE: D-Dimer, Quant: 2.19 ug/mL-FEU — ABNORMAL HIGH (ref 0.00–0.50)

## 2019-10-19 MED ORDER — DEXAMETHASONE SODIUM PHOSPHATE 10 MG/ML IJ SOLN
6.0000 mg | INTRAMUSCULAR | Status: DC
  Administered 2019-10-19 – 2019-10-22 (×4): 6 mg via INTRAVENOUS
  Filled 2019-10-19 (×5): qty 1

## 2019-10-19 NOTE — Progress Notes (Signed)
Occupational Therapy Evaluation Patient Details Name: [name] MRN: [id number] DOB: Sep 02, 1932 Today's Date: 10/19/2019    History of Present Illness 85 year old resident of an ALF with a history of COPD requiring 2 L/min supplemental oxygen at home, GERD, vascular dementia, HTN, HLD, and DM 2 who presented to the [facility] ED with shortness of breath over 24 hours duration.  She was known to have tested positive for Covid at her facility on 12/4.  In the ED her oxygen saturations were 93-100% on 2 L/min nasal cannula.  CXR noted a left perihilar infiltrate.   Clinical Impression   Pt apparently from ALF. Did not get answer from facility therefore unsure of pt's functional level. Pt currently max A with mobility and Max A with ADL. If facility able to help  At this level, pt able to DC back, if not, pt will need rehab at [facility]. Will follow acutely.     Follow Up Recommendations  SNF;Supervision/Assistance - 24 hour    Equipment Recommendations  None recommended by OT    Recommendations for Other Services       Precautions / Restrictions Precautions Precautions: Fall      Mobility Bed Mobility Overal bed mobility: Needs Assistance Bed Mobility: Rolling;Sidelying to Sit;Sit to Supine Rolling: Mod assist     Sit to supine: Mod assist      Transfers                 General transfer comment: not attempted     Balance                                           ADL either performed or assessed with clinical judgement   ADL Overall ADL's : Needs assistance/impaired Eating/Feeding: Minimal assistance   Grooming: Minimal assistance;Sitting   Upper Body Bathing: Moderate assistance;Sitting   Lower Body Bathing: Maximal assistance;Bed level   Upper Body Dressing : Moderate assistance;Sitting   Lower Body Dressing: Maximal assistance;Bed level               Functional mobility during ADLs: Maximal assistance       Vision          Perception     Praxis      Pertinent Vitals/Pain Pain Assessment: No/denies pain     Hand Dominance     Extremity/Trunk Assessment Upper Extremity Assessment Upper Extremity Assessment: Generalized weakness;RUE deficits/detail RUE Deficits / Details: r shoulder dysfunction at baseline   Lower Extremity Assessment Lower Extremity Assessment: Defer to PT evaluation   Cervical / Trunk Assessment Cervical / Trunk Assessment: Kyphotic   Communication     Cognition Arousal/Alertness: Awake/alert Behavior During Therapy: Flat affect Overall Cognitive Status: No family/caregiver present to determine baseline cognitive functioning Area of Impairment: Orientation;Attention;Safety/judgement;Memory;Awareness                 Orientation Level: Disoriented to;Time;Situation;Place Current Attention Level: Sustained Memory: Decreased short-term memory   Safety/Judgement: Decreased awareness of safety;Decreased awareness of deficits Awareness: Emergent       General Comments       Exercises     Shoulder Instructions      Home Living Family/patient expects to be discharged to:: Skilled nursing facility  Prior Functioning/Environment Level of Independence: Needs assistance  Gait / Transfers Assistance Needed: pt. unable to provide information     Comments: Pt states she is able to feed herself adn assist with her bath.         OT Problem List: Decreased strength;Decreased range of motion;Decreased activity tolerance;Impaired balance (sitting and/or standing);Decreased cognition;Decreased knowledge of use of DME or AE;Decreased safety awareness;Cardiopulmonary status limiting activity;Obesity      OT Treatment/Interventions: Self-care/ADL training;Therapeutic exercise;Neuromuscular education;Energy conservation;DME and/or AE instruction;Therapeutic activities;Cognitive remediation/compensation;Patient/family  education;Balance training    OT Goals(Current goals can be found in the care plan section) Acute Rehab OT Goals Patient Stated Goal: to rest OT Goal Formulation: Patient unable to participate in goal setting Time For Goal Achievement: 11/02/19 Potential to Achieve Goals: Good  OT Frequency: Min 2X/week   Barriers to D/C:            Co-evaluation              AM-PAC OT "6 Clicks" Daily Activity     Outcome Measure Help from another person eating meals?: A Little Help from another person taking care of personal grooming?: A Little Help from another person toileting, which includes using toliet, bedpan, or urinal?: A Lot Help from another person bathing (including washing, rinsing, drying)?: A Lot Help from another person to put on and taking off regular upper body clothing?: A Lot Help from another person to put on and taking off regular lower body clothing?: A Lot 6 Click Score: 14   End of Session Nurse Communication: Mobility status  Activity Tolerance: Patient limited by fatigue Patient left: in bed;with call bell/phone within reach;with bed alarm set  OT Visit Diagnosis: Unsteadiness on feet (R26.81);Other abnormalities of gait and mobility (R26.89);Muscle weakness (generalized) (M62.81);Other symptoms and signs involving cognitive function                Time: 1700-1720 OT Time Calculation (min): 20 min Charges:  OT General Charges $OT Visit: 1 Visit OT Evaluation $OT Eval Moderate Complexity: 1 Mod  Cortasia Screws, OT/L   Acute OT Clinical Specialist Acute Rehabilitation Services Pager [phone] Office [phone]   [facility] 10/19/2019, 7:50 PM

## 2019-10-19 NOTE — Progress Notes (Signed)
PROGRESS NOTE                                                                                                                                                                                                             Patient Demographics:    [name], is a 85 y.o. female, DOB - November 19, 1931, POE:[id number]  Outpatient Primary MD for the patient is Kirk Ruths, MD   Admit date - 10/15/2019   LOS - 4  No chief complaint on file.      Brief Narrative: Patient is a 85 y.o. female with PMHx of COPD on 2 L of oxygen at home, dementia, HTN, HLD, DM-2 who presented with shortness of breath-further evaluation revealed COVID-19 pneumonia.   Subjective:    [name] today was lying comfortably in bed.  Denies any nausea, vomiting.   Assessment  & Plan :    Covid 19 Viral pneumonia: Remains stable on 2-3 L of oxygen.  Very comfortable-continue steroids and remdesivir.    Fever: afebrile  O2 requirements:  SpO2: 97 % O2 Flow Rate (L/min): 3 L/min   COVID-19 Labs: Recent Labs    10/17/19 0435 10/18/19 0330 10/19/19 0134  DDIMER 2.50* 0.49 2.19*  FERRITIN 316* 196 301  CRP 1.7* 0.8 5.8*       Component Value Date/Time   BNP 219.0 (H) 10/15/2019 0647    Recent Labs  Lab 10/15/19 0647  PROCALCITON <0.10    Lab Results  Component Value Date   SARSCOV2NAA NEGATIVE 05/23/2019     COVID-19 Medications: Remdesivir 12/7 > Decadron 12/7 >  Other medications: Diuretics:Euvolemic-on oral Lasix to maintain negative balance Antibiotics:Not needed as no evidence of bacterial infection  Prone/Incentive Spirometry: encouraged incentive spirometry use 3-4/hour.  DVT Prophylaxis  :  Lovenox   HTN: Relatively well-controlled-continue amlodipine, Lasix and metoprolol.   DM-2 (A1c 7.0): CBGs stable with SSI  CBG (last 3)  Recent Labs    10/18/19 1953 10/19/19 0728 10/19/19 1122  GLUCAP 107* 116* 164*    GERD: Continue PPI  COPD with chronic hypoxic respiratory failure: Continue bronchodilators-stable on 2 L of oxygen.  Vascular dementia: Expect delirium-continue Depakote  Nutrition Problem: Nutrition Problem: Increased nutrient needs Etiology: acute illness(COVID-19 pneumonia) Signs/Symptoms: estimated needs Interventions: Ensure Enlive (each supplement provides 350kcal and 20 grams of protein), Liberalize Diet, Magic cup  Consults  :  None  Procedures  :  None  ABG:    Component Value Date/Time   PHART 7.39 08/15/2018 1127   PCO2ART 48 08/15/2018 1127   PO2ART 75 (L) 08/15/2018 1127   HCO3 31.8 (H) 06/06/2019 1859   O2SAT 18.0 06/06/2019 1859    Vent Settings: N/A   Condition - Stable  Family Communication  :  Son updated over the phone on 12/11  Code Status :  Full Code  Diet :  Diet Order            Diet regular Room service appropriate? Yes; Fluid consistency: Thin  Diet effective now               Disposition Plan  :  Remain hospitalized  Barriers to discharge: Hypoxia requiring O2 supplementation/complete 5 days of IV Remdesivir  Antimicorbials  :    Anti-infectives (From admission, onward)   Start     Dose/Rate Route Frequency Ordered Stop   10/17/19 1000  remdesivir 100 mg in sodium chloride 0.9 % 100 mL IVPB  Status:  Discontinued     100 mg 200 mL/hr over 30 Minutes Intravenous Daily 10/16/19 0311 10/16/19 1904   10/17/19 0400  remdesivir 100 mg in sodium chloride 0.9 % 100 mL IVPB     100 mg 200 mL/hr over 30 Minutes Intravenous Daily 10/16/19 1906 10/21/19 0959   10/16/19 2000  remdesivir 100 mg in sodium chloride 0.9 % 100 mL IVPB  Status:  Discontinued     100 mg 200 mL/hr over 30 Minutes Intravenous Daily 10/16/19 1904 10/16/19 1906   10/16/19 0400  remdesivir 200 mg in sodium chloride 0.9% 250 mL IVPB     200 mg 580 mL/hr over 30 Minutes Intravenous Once 10/16/19 0311 10/16/19 0442      Inpatient Medications  Scheduled Meds: .  amLODipine  5 mg Oral Daily  . aspirin EC  81 mg Oral Daily  . cholecalciferol  2,000 Units Oral Daily  . dexamethasone (DECADRON) injection  6 mg Intravenous Q24H  . dextromethorphan-guaiFENesin  1 tablet Oral BID  . dicyclomine  20 mg Oral QID  . divalproex  250 mg Oral QHS  . enoxaparin (LOVENOX) injection  40 mg Subcutaneous Q24H  . feeding supplement (ENSURE ENLIVE)  237 mL Oral BID BM  . furosemide  20 mg Oral Daily  . insulin aspart  0-15 Units Subcutaneous TID WC  . insulin aspart  0-5 Units Subcutaneous QHS  . metoprolol tartrate  25 mg Oral BID  . multivitamin with minerals  1 tablet Oral Daily  . olopatadine  1 drop Both Eyes Daily  . pantoprazole  40 mg Oral Daily  . vitamin C  500 mg Oral Daily  . zinc sulfate  220 mg Oral Daily   Continuous Infusions: . remdesivir 100 mg in NS 100 mL 100 mg (10/19/19 0930)   PRN Meds:.acetaminophen, albuterol, alum & mag hydroxide-simeth, bismuth subsalicylate, chlorpheniramine-HYDROcodone, LORazepam, ondansetron **OR** ondansetron (ZOFRAN) IV, promethazine, QUEtiapine   Time Spent in minutes  25  See all Orders from today for further details   Jeoffrey Massed M.D on 10/19/2019 at 4:09 PM  To page go to [email or web address] - use universal password  [facility] -  Office  [phone]    Objective:   Vitals:   10/19/19 0500 10/19/19 0541 10/19/19 0807 10/19/19 1538  BP:   (!) 120/42 (!) 135/49  Pulse:    (!) 59  Resp: 20   18  Temp: 97.9 F (36.6 C)  98.6 F (37 C) 98.1 F (36.7 C)  TempSrc: Oral  Oral Oral  SpO2:  97%  97%  Weight:      Height:        Wt Readings from Last 3 Encounters:  10/16/19 76.3 kg  10/15/19 76.3 kg  06/06/19 80 kg     Intake/Output Summary (Last 24 hours) at 10/19/2019 1609 Last data filed at 10/19/2019 1300 Gross per 24 hour  Intake 150 ml  Output 200 ml  Net -50 ml     Physical Exam Gen Exam:Alert awake-not in any distress HEENT:atraumatic, normocephalic Chest: B/L clear  to auscultation anteriorly CVS:S1S2 regular Abdomen:soft non tender, non distended Extremities:no edema Neurology: Non focal Skin: no rash   Data Review:    CBC Recent Labs  Lab 10/15/19 0647 10/16/19 0540 10/17/19 0435 10/18/19 0330 10/19/19 0134  WBC 3.8*  3.7* 3.4* 5.0 11.3* 3.7*  HGB 11.7*  11.6* 10.1* 11.8* 13.8 10.8*  HCT 38.5  38.6 34.2* 39.9 42.6 37.8  PLT 111*  111* 122* 185 297 149*  MCV 82.3  83.0 83.4 84.7 93.0 87.7  MCH 25.0*  24.9* 24.6* 25.1* 30.1 25.1*  MCHC 30.4  30.1 29.5* 29.6* 32.4 28.6*  RDW 15.7*  15.8* 15.4 15.6* 13.8 15.4  LYMPHSABS 0.8 0.7 0.8 0.8 0.9  MONOABS 0.3 0.3 0.3 0.8 0.3  EOSABS 0.0 0.0 0.0 0.0 0.0  BASOSABS 0.0 0.0 0.0 0.1 0.0    Chemistries  Recent Labs  Lab 10/15/19 0647 10/16/19 0540 10/17/19 0435 10/18/19 0330 10/19/19 0134  NA 140 141 140 137 145  K 5.2* 4.8 4.5 4.7 5.0  CL 112* 110 106 91* 108  CO2 21* 21* 28 31 28   GLUCOSE 144* 129* 124* 180* 116*  BUN 28* 26* 21 59* 36*  CREATININE 1.01* 0.78 0.61 1.11* 0.86  CALCIUM 8.5* 8.7* 9.2 9.6 8.7*  MG  --  1.7 2.1  --   --   AST  --  15 20 22 16   ALT  --  13 15 15 13   ALKPHOS  --  57 64 83 54  BILITOT  --  0.2* 0.5 1.3* 0.8   ------------------------------------------------------------------------------------------------------------------ No results for input(s): CHOL, HDL, LDLCALC, TRIG, CHOLHDL, LDLDIRECT in the last 72 hours.  Lab Results  Component Value Date   HGBA1C 7.0 (H) 10/16/2019   ------------------------------------------------------------------------------------------------------------------ No results for input(s): TSH, T4TOTAL, T3FREE, THYROIDAB in the last 72 hours.  Invalid input(s): FREET3 ------------------------------------------------------------------------------------------------------------------ Recent Labs    10/18/19 0330 10/19/19 0134  FERRITIN 196 301    Coagulation profile No results for input(s): INR, PROTIME in the  last 168 hours.  Recent Labs    10/18/19 0330 10/19/19 0134  DDIMER 0.49 2.19*    Cardiac Enzymes No results for input(s): CKMB, TROPONINI, MYOGLOBIN in the last 168 hours.  Invalid input(s): CK ------------------------------------------------------------------------------------------------------------------    Component Value Date/Time   BNP 219.0 (H) 10/15/2019 16100647    Micro Results Recent Results (from the past 240 hour(s))  Blood Culture (routine x 2)     Status: None (Preliminary result)   Collection Time: 10/15/19  8:55 AM   Specimen: BLOOD  Result Value Ref Range Status   Spec[facility]  Final   Special Requests   Final    BOTTLES DRAWN AEROBIC AND ANAEROBIC Blood Culture adequate volume   Culture   Final    NO GROWTH 4 DAYS Performed at [facility], [address]  [place], [place] [address]    Report Status PENDING  Incomplete  Blood Culture (routine x 2)     Status: None (Preliminary result)   Collection Time: 10/15/19  8:55 AM   Specimen: BLOOD  Result Value Ref Range Status   Specimen Description BLOOD LEFT HAND  Final   Special Requests   Final    BOTTLES DRAWN AEROBIC AND ANAEROBIC Blood Culture results may not be optimal due to an inadequate volume of blood received in culture bottles   Culture   Final    NO GROWTH 4 DAYS Performed at [facility], [address] [place], [place] [address]    Report Status PENDING  Incomplete  MRSA PCR Screening     Status: None   Collection Time: 10/16/19  2:22 AM   Specimen: Nasal Mucosa; Nasopharyngeal  Result Value Ref Range Status   MRSA by PCR NEGATIVE NEGATIVE Final    Comment:        The GeneXpert MRSA Assay (FDA approved for NASAL specimens only), is one component of a comprehensive MRSA colonization surveillance program. It is not intended to diagnose MRSA infection nor to guide or monitor treatment for MRSA infections. Performed at [facility], [address], [place], [place] [address]     Radiology Reports DG Chest Port 1 View  Result Date: 10/17/2019 CLINICAL DATA:  COVID-19 pneumonia EXAM: PORTABLE CHEST 1 VIEW COMPARISON:  10/15/2019 FINDINGS: Midline trachea. Mild cardiomegaly. Atherosclerosis in the transverse aorta. No pleural effusion or pneumothorax. Patchy left greater than right airspace disease is not significantly changed. Left perihilar and infrahilar. Medial right upper lobe and lateral right lung base. IMPRESSION: No significant change in left greater than right multifocal pneumonia. Aortic Atherosclerosis (ICD10-I70.0). Electronically Signed   By: Jeronimo Greaves M.D.   On: 10/17/2019 08:28   DG Chest Portable 1 View  Result Date: 10/15/2019 CLINICAL DATA:  Shortness of breath.  COVID-19 positive EXAM: PORTABLE CHEST 1 VIEW COMPARISON:  06/06/2019 FINDINGS: Left perihilar airspace disease. Cardiomegaly accentuated by leftward rotation. Surgical clips over the right mediastinum which are posterior mediastinal by 05/2019 CT. Hyperinflation. Remote right humeral neck fracture IMPRESSION: Left perihilar pneumonia. Electronically Signed   By: Marnee Spring M.D.   On: 10/15/2019 07:08

## 2019-10-20 LAB — CBC WITH DIFFERENTIAL/PLATELET
Abs Immature Granulocytes: 0.02 10*3/uL (ref 0.00–0.07)
Basophils Absolute: 0 10*3/uL (ref 0.0–0.1)
Basophils Relative: 1 %
Eosinophils Absolute: 0 10*3/uL (ref 0.0–0.5)
Eosinophils Relative: 0 %
HCT: 39.1 % (ref 36.0–46.0)
Hemoglobin: 11.4 g/dL — ABNORMAL LOW (ref 12.0–15.0)
Immature Granulocytes: 1 %
Lymphocytes Relative: 22 %
Lymphs Abs: 0.6 10*3/uL — ABNORMAL LOW (ref 0.7–4.0)
MCH: 24.9 pg — ABNORMAL LOW (ref 26.0–34.0)
MCHC: 29.2 g/dL — ABNORMAL LOW (ref 30.0–36.0)
MCV: 85.6 fL (ref 80.0–100.0)
Monocytes Absolute: 0.2 10*3/uL (ref 0.1–1.0)
Monocytes Relative: 6 %
Neutro Abs: 2 10*3/uL (ref 1.7–7.7)
Neutrophils Relative %: 70 %
Platelets: 183 10*3/uL (ref 150–400)
RBC: 4.57 MIL/uL (ref 3.87–5.11)
RDW: 15 % (ref 11.5–15.5)
WBC: 2.9 10*3/uL — ABNORMAL LOW (ref 4.0–10.5)
nRBC: 0 % (ref 0.0–0.2)

## 2019-10-20 LAB — COMPREHENSIVE METABOLIC PANEL
ALT: 15 U/L (ref 0–44)
AST: 21 U/L (ref 15–41)
Albumin: 2.9 g/dL — ABNORMAL LOW (ref 3.5–5.0)
Alkaline Phosphatase: 57 U/L (ref 38–126)
Anion gap: 10 (ref 5–15)
BUN: 44 mg/dL — ABNORMAL HIGH (ref 8–23)
CO2: 29 mmol/L (ref 22–32)
Calcium: 9.3 mg/dL (ref 8.9–10.3)
Chloride: 108 mmol/L (ref 98–111)
Creatinine, Ser: 0.95 mg/dL (ref 0.44–1.00)
GFR calc Af Amer: >60 mL/min (ref >60)
GFR calc non Af Amer: 55 mL/min — ABNORMAL LOW (ref >60)
Glucose, Bld: 144 mg/dL — ABNORMAL HIGH (ref 70–99)
Potassium: 5.1 mmol/L (ref 3.5–5.1)
Sodium: 147 mmol/L — ABNORMAL HIGH (ref 135–145)
Total Bilirubin: 0.6 mg/dL (ref 0.3–1.2)
Total Protein: 7 g/dL (ref 6.5–8.1)

## 2019-10-20 LAB — CULTURE, BLOOD (ROUTINE X 2)
Culture: NO GROWTH
Culture: NO GROWTH
Special Requests: ADEQUATE

## 2019-10-20 LAB — GLUCOSE, CAPILLARY
Glucose-Capillary: 136 mg/dL — ABNORMAL HIGH (ref 70–99)
Glucose-Capillary: 180 mg/dL — ABNORMAL HIGH (ref 70–99)
Glucose-Capillary: 205 mg/dL — ABNORMAL HIGH (ref 70–99)
Glucose-Capillary: 156 mg/dL — ABNORMAL HIGH (ref 70–99)

## 2019-10-20 LAB — FERRITIN: Ferritin: 293 ng/mL (ref 11–307)

## 2019-10-20 LAB — C-REACTIVE PROTEIN: CRP: 3.6 mg/dL — ABNORMAL HIGH (ref <1.0)

## 2019-10-20 LAB — D-DIMER, QUANTITATIVE: D-Dimer, Quant: 2.01 ug/mL-FEU — ABNORMAL HIGH (ref 0.00–0.50)

## 2019-10-20 NOTE — [place] FL2 (Signed)
[place] MEDICAID FL2 LEVEL OF CARE SCREENING TOOL     IDENTIFICATION  Patient Name: [name] Birthdate: 1932-01-14 Sex: female Admission Date (Current Location): 10/15/2019  [facility] and [place] Number:  Producer, television/film/video and Address:  [facility], [address], [place], [place] [address]([place]      Provider Number: [id number]  Attending Physician Name and Address:  Maretta Bees, MD  Relative Name and Phone Number:  Son: Mora Pedraza  [phone]    Current Level of Care: Hospital Recommended Level of Care: Skilled Nursing Facility Prior Approval Number:    Date Approved/Denied:   PASRR Number: [id number]  Discharge Plan: SNF    Current Diagnoses: Patient Active Problem List   Diagnosis Date Noted  . Pneumonia due to COVID-19 virus 10/16/2019  . Elevated brain natriuretic peptide (BNP) level 10/16/2019  . Lactic acidosis 10/16/2019  . Acute on chronic respiratory failure with hypoxia (HCC) 10/15/2019  . Suspected COVID-19 virus infection 10/15/2019  . HTN (hypertension) 10/15/2019  . Chronic diastolic (congestive) heart failure (HCC) 10/15/2019  . Hyperkalemia 10/15/2019  . CKD (chronic kidney disease), stage IIIa 10/15/2019  . Hypoxia 05/23/2019  . Acute delirium 08/16/2018  . Dementia with behavioral disturbance (HCC) 08/16/2018  . Encephalopathy acute 08/11/2018  . Sepsis (HCC) 05/21/2017  . Aspiration pneumonia (HCC) 05/21/2017  . GERD (gastroesophageal reflux disease) 05/21/2017  . HLD (hyperlipidemia) 05/21/2017  . Depression 05/21/2017  . Anxiety 05/21/2017  . Elevated troponin 05/21/2017  . Diabetes (HCC) 05/21/2017  . Right humeral fracture 05/21/2017  . Nausea and vomiting 03/31/2015  . Microcytic anemia 03/31/2015    Orientation RESPIRATION BLADDER Height & Weight     Self  Normal Incontinent Weight: 76.3 kg Height:  5\' 5"  (165.1 cm)  BEHAVIORAL SYMPTOMS/MOOD NEUROLOGICAL BOWEL NUTRITION  STATUS      Incontinent Diet  AMBULATORY STATUS COMMUNICATION OF NEEDS Skin   Extensive Assist Verbally Normal                       Personal Care Assistance Level of Assistance  Bathing, Dressing, Total care, Feeding Bathing Assistance: Maximum assistance Feeding assistance: Maximum assistance Dressing Assistance: Maximum assistance Total Care Assistance: Maximum assistance   Functional Limitations Info             SPECIAL CARE FACTORS FREQUENCY  PT (By licensed PT), OT (By licensed OT)     PT Frequency: 5x/week OT Frequency: min3x/wee            Contractures Contractures Info: Not present    Additional Factors Info  Code Status, Allergies Code Status Info: DNR Allergies Info: Ampicillin,sulfa antibiotics           Current Medications (10/20/2019):  This is the current hospital active medication list Current Facility-Administered Medications  Medication Dose Route Frequency Provider Last Rate Last Admin  . acetaminophen (TYLENOL) tablet 650 mg  650 mg Oral Q4H PRN 14/10/2019, MD   650 mg at 10/18/19 0930  . albuterol (VENTOLIN HFA) 108 (90 Base) MCG/ACT inhaler 2 puff  2 puff Inhalation Q6H PRN Adefeso, Oladapo, DO   2 puff at 10/20/19 0807  . alum & mag hydroxide-simeth (MAALOX/MYLANTA) 200-200-20 MG/5ML suspension 30 mL  30 mL Oral PRN 05-17-2001, MD   30 mL at 10/17/19 0137  . amLODipine (NORVASC) tablet 5 mg  5 mg Oral Daily 14/09/20, MD   5 mg at 10/20/19 0802  .  aspirin EC tablet 81 mg  81 mg Oral Daily Cherene Altes, MD   81 mg at 10/20/19 6389  . bismuth subsalicylate (PEPTO BISMOL) 262 MG/15ML suspension 15 mL  15 mL Oral Q2H PRN Cherene Altes, MD      . chlorpheniramine-HYDROcodone (TUSSIONEX) 10-8 MG/5ML suspension 5 mL  5 mL Oral Q12H PRN Adefeso, Oladapo, DO   5 mL at 10/18/19 1253  . cholecalciferol (VITAMIN D3) tablet 2,000 Units  2,000 Units Oral Daily Cherene Altes, MD   2,000 Units at 10/20/19 [phone]  .  dexamethasone (DECADRON) injection 6 mg  6 mg Intravenous Q24H Jonetta Osgood, MD   6 mg at 10/20/19 0650  . dextromethorphan-guaiFENesin (MUCINEX DM) 30-600 MG per 12 hr tablet 1 tablet  1 tablet Oral BID Adefeso, Oladapo, DO   1 tablet at 10/20/19 0802  . dicyclomine (BENTYL) tablet 20 mg  20 mg Oral QID Cherene Altes, MD   20 mg at 10/20/19 2876  . divalproex (DEPAKOTE ER) 24 hr tablet 250 mg  250 mg Oral QHS Cherene Altes, MD   250 mg at 10/19/19 2037  . enoxaparin (LOVENOX) injection 40 mg  40 mg Subcutaneous Q24H Adefeso, Oladapo, DO   40 mg at 10/20/19 0502  . feeding supplement (ENSURE ENLIVE) (ENSURE ENLIVE) liquid 237 mL  237 mL Oral BID BM Cherene Altes, MD   237 mL at 10/20/19 0807  . insulin aspart (novoLOG) injection 0-15 Units  0-15 Units Subcutaneous TID WC Adefeso, Oladapo, DO   3 Units at 10/20/19 1232  . insulin aspart (novoLOG) injection 0-5 Units  0-5 Units Subcutaneous QHS Adefeso, Oladapo, DO      . LORazepam (ATIVAN) tablet 0.25 mg  0.25 mg Oral Q6H PRN Cherene Altes, MD   0.25 mg at 10/20/19 0053  . metoprolol tartrate (LOPRESSOR) tablet 25 mg  25 mg Oral BID Cherene Altes, MD   25 mg at 10/20/19 8115  . multivitamin with minerals tablet 1 tablet  1 tablet Oral Daily Cherene Altes, MD   1 tablet at 10/20/19 [phone]  . olopatadine (PATANOL) 0.1 % ophthalmic solution 1 drop  1 drop Both Eyes Daily Cherene Altes, MD   1 drop at 10/20/19 0804  . ondansetron (ZOFRAN) tablet 4 mg  4 mg Oral Q6H PRN Adefeso, Oladapo, DO       Or  . ondansetron (ZOFRAN) injection 4 mg  4 mg Intravenous Q6H PRN Adefeso, Oladapo, DO   4 mg at 10/18/19 1253  . pantoprazole (PROTONIX) EC tablet 40 mg  40 mg Oral Daily Adefeso, Oladapo, DO   40 mg at 10/20/19 0804  . promethazine (PHENERGAN) injection 12.5 mg  12.5 mg Intravenous Q6H PRN Jonetta Osgood, MD      . QUEtiapine (SEROQUEL) tablet 25 mg  25 mg Oral BID PRN Jonetta Osgood, MD   25 mg at 10/19/19 2037   . vitamin C (ASCORBIC ACID) tablet 500 mg  500 mg Oral Daily Adefeso, Oladapo, DO   500 mg at 10/20/19 0809  . zinc sulfate capsule 220 mg  220 mg Oral Daily Adefeso, Oladapo, DO   220 mg at 10/20/19 0803     Discharge Medications: Please see discharge summary for a list of discharge medications.  Relevant Imaging Results:  Relevant Lab Results:   Additional Information SSN: [id number]  Ninfa Meeker, RN

## 2019-10-20 NOTE — TOC Progression Note (Addendum)
Transition of Care [facility]) - Progression Note    Patient Details  Name: [name] MRN: [id number] Date of Birth: 04-18-1932  [facility]) CM/SW Contact  Jessica Grayer [name], RN Phone Number: 10/20/2019, 1:16 PM  Clinical Narrative:   Patient is from [facility] [phone], Case manager has left several messages today with no response. Waiting to see if patient is able to return to their facility or if she will need SNF. FL2 completed. Will need to speak with someone at facility. 1500: CM discussed patient with bedside RN, she is total care and will need SNF prior to returning to [place]. Case manager will contact patient's son-[name] and will fax her out.            Expected Discharge Plan and Services                                                 Social Determinants of Health (SDOH) Interventions    Readmission Risk Interventions Readmission Risk Prevention Plan 05/28/2019 05/27/2019 05/24/2019  Transportation Screening Complete Complete Complete  Social Work Consult for [place] Planning/Counseling - Complete -  Medication Review Press photographer) Complete Complete Complete  PCP or Specialist appointment within 3-5 days of discharge (No Data) - -  [place] or Home Care Consult Complete - -  Palliative Care Screening Not Applicable - -  [place] Not Applicable - -  Some recent data might be hidden

## 2019-10-20 NOTE — TOC Progression Note (Signed)
Transition of Care [facility]) - Progression Note    Patient Details  Name: [name] MRN: [id number] Date of Birth: 16-Jun-1932  [facility]) CM/SW Contact  Loletha Grayer [name], RN Phone Number: [phone] (working remotely) 10/20/2019, 3:15 PM  Clinical Narrative:   Case manager contacted patient's [name] to discuss his mom's need for shortterm SNF. Choices were discussed via telephone. [name] agreed for patient to be faxed to [facility] and [facility]. Case Manager let him know we will contact him with bed offers when received,          Expected Discharge Plan and Services                                                 Social Determinants of Health (SDOH) Interventions    Readmission Risk Interventions Readmission Risk Prevention Plan 05/28/2019 05/27/2019 05/24/2019  Transportation Screening Complete Complete Complete  Social Work Consult for [place] Planning/Counseling - Complete -  Medication Review Press photographer) Complete Complete Complete  PCP or Specialist appointment within 3-5 days of discharge (No Data) - -  [place] or Home Care Consult Complete - -  Palliative Care Screening Not Applicable - -  [place] Not Applicable - -  Some recent data might be hidden

## 2019-10-20 NOTE — Progress Notes (Signed)
PROGRESS NOTE                                                                                                                                                                                                             Patient Demographics:    [name], is a 85 y.o. female, DOB - 1932-10-23, LGX:[id number]  Outpatient Primary MD for the patient is Lauro Regulus, MD   Admit date - 10/15/2019   LOS - 5  No chief complaint on file.      Brief Narrative: Patient is a 85 y.o. female with PMHx of COPD on 2 L of oxygen at home, dementia, HTN, HLD, DM-2 who presented with shortness of breath-further evaluation revealed COVID-19 pneumonia.   Subjective:   Lying comfortably in bed-no major events overnight.   Assessment  & Plan :    Covid 19 Viral pneumonia: Remains stable-on either room air or on 2 L of oxygen.  Will complete remdesivir on 12/12-remains on steroids.  Follow inflammatory markers..    Fever: afebrile  O2 requirements:  SpO2: 92 % O2 Flow Rate (L/min): 2 L/min   COVID-19 Labs: Recent Labs    10/18/19 0330 10/19/19 0134 10/20/19 0116  DDIMER 0.49 2.19* 2.01*  FERRITIN 196 301 293  CRP 0.8 5.8* 3.6*       Component Value Date/Time   BNP 219.0 (H) 10/15/2019 0647    Recent Labs  Lab 10/15/19 0647  PROCALCITON <0.10    Lab Results  Component Value Date   SARSCOV2NAA NEGATIVE 05/23/2019     COVID-19 Medications: Remdesivir 12/7 > Decadron 12/7 >  Other medications: Diuretics:Euvolemic-hold Lasix today-has mild hypernatremia Antibiotics:Not needed as no evidence of bacterial infection  Prone/Incentive Spirometry: encouraged incentive spirometry use 3-4/hour.  DVT Prophylaxis  :  Lovenox   HTN: Relatively well-controlled-continue amlodipine, Lasix and metoprolol.   DM-2 (A1c 7.0): CBGs stable with SSI  CBG (last 3)  Recent Labs    10/19/19 1942 10/20/19 0716  10/20/19 1138  GLUCAP 165* 136* 180*   GERD: Continue PPI  COPD with chronic hypoxic respiratory failure: Continue bronchodilators-stable on 2 L of oxygen.  Vascular dementia: Expect delirium-continue Depakote  Nutrition Problem: Nutrition Problem: Increased nutrient needs Etiology: acute illness(COVID-19 pneumonia) Signs/Symptoms: estimated needs Interventions: Ensure Enlive (each supplement provides 350kcal and 20 grams of protein), Liberalize Diet, Magic  cup  Consults  :  None  Procedures  :  None  ABG:    Component Value Date/Time   PHART 7.39 08/15/2018 1127   PCO2ART 48 08/15/2018 1127   PO2ART 75 (L) 08/15/2018 1127   HCO3 31.8 (H) 06/06/2019 1859   O2SAT 18.0 06/06/2019 1859    Vent Settings: N/A   Condition - Stable  Family Communication  :  Son updated over the phone on 12/11-we will reach out to him tomorrow.  Code Status :  Full Code  Diet :  Diet Order            Diet regular Room service appropriate? Yes; Fluid consistency: Thin  Diet effective now               Disposition Plan  :  Remain hospitalized-SNF on discharge-social work following.  Barriers to discharge: Hypoxia requiring O2 supplementation/complete 5 days of IV Remdesivir  Antimicorbials  :    Anti-infectives (From admission, onward)   Start     Dose/Rate Route Frequency Ordered Stop   10/17/19 1000  remdesivir 100 mg in sodium chloride 0.9 % 100 mL IVPB  Status:  Discontinued     100 mg 200 mL/hr over 30 Minutes Intravenous Daily 10/16/19 0311 10/16/19 1904   10/17/19 0400  remdesivir 100 mg in sodium chloride 0.9 % 100 mL IVPB     100 mg 200 mL/hr over 30 Minutes Intravenous Daily 10/16/19 1906 10/20/19 0838   10/16/19 2000  remdesivir 100 mg in sodium chloride 0.9 % 100 mL IVPB  Status:  Discontinued     100 mg 200 mL/hr over 30 Minutes Intravenous Daily 10/16/19 1904 10/16/19 1906   10/16/19 0400  remdesivir 200 mg in sodium chloride 0.9% 250 mL IVPB     200 mg 580 mL/hr  over 30 Minutes Intravenous Once 10/16/19 0311 10/16/19 0442      Inpatient Medications  Scheduled Meds: . amLODipine  5 mg Oral Daily  . aspirin EC  81 mg Oral Daily  . cholecalciferol  2,000 Units Oral Daily  . dexamethasone (DECADRON) injection  6 mg Intravenous Q24H  . dextromethorphan-guaiFENesin  1 tablet Oral BID  . dicyclomine  20 mg Oral QID  . divalproex  250 mg Oral QHS  . enoxaparin (LOVENOX) injection  40 mg Subcutaneous Q24H  . feeding supplement (ENSURE ENLIVE)  237 mL Oral BID BM  . insulin aspart  0-15 Units Subcutaneous TID WC  . insulin aspart  0-5 Units Subcutaneous QHS  . metoprolol tartrate  25 mg Oral BID  . multivitamin with minerals  1 tablet Oral Daily  . olopatadine  1 drop Both Eyes Daily  . pantoprazole  40 mg Oral Daily  . vitamin C  500 mg Oral Daily  . zinc sulfate  220 mg Oral Daily   Continuous Infusions:  PRN Meds:.acetaminophen, albuterol, alum & mag hydroxide-simeth, bismuth subsalicylate, chlorpheniramine-HYDROcodone, LORazepam, ondansetron **OR** ondansetron (ZOFRAN) IV, promethazine, QUEtiapine   Time Spent in minutes  25  See all Orders from today for further details   Oren Binet M.D on 10/20/2019 at 3:55 PM  To page go to [email or web address] - use universal password  [facility] -  Office  [phone]    Objective:   Vitals:   10/19/19 1939 10/20/19 0500 10/20/19 0714 10/20/19 1500  BP: (!) 143/58 (!) 135/55 (!) 128/47 (!) 135/59  Pulse: 71 78 65 77  Resp: 16 19 18 20   Temp: 98.5 F (36.9 C) 98.2 F (36.8  C) 98.2 F (36.8 C) 98 F (36.7 C)  TempSrc: Oral Oral Axillary Oral  SpO2: 90% 98% 98% 92%  Weight:      Height:        Wt Readings from Last 3 Encounters:  10/16/19 76.3 kg  10/15/19 76.3 kg  06/06/19 80 kg     Intake/Output Summary (Last 24 hours) at 10/20/2019 1555 Last data filed at 10/20/2019 1044 Gross per 24 hour  Intake 200 ml  Output 900 ml  Net -700 ml     Physical Exam Gen Exam:  Pleasantly confused-not in any distress HEENT:atraumatic, normocephalic Chest: B/L clear to auscultation anteriorly CVS:S1S2 regular Abdomen:soft non tender, non distended Extremities:no edema Neurology: Non focal-but with significant generalized weakness. Skin: no rash   Data Review:    CBC Recent Labs  Lab 10/16/19 0540 10/17/19 0435 10/18/19 0330 10/19/19 0134 10/20/19 0116  WBC 3.4* 5.0 11.3* 3.7* 2.9*  HGB 10.1* 11.8* 13.8 10.8* 11.4*  HCT 34.2* 39.9 42.6 37.8 39.1  PLT 122* 185 297 149* 183  MCV 83.4 84.7 93.0 87.7 85.6  MCH 24.6* 25.1* 30.1 25.1* 24.9*  MCHC 29.5* 29.6* 32.4 28.6* 29.2*  RDW 15.4 15.6* 13.8 15.4 15.0  LYMPHSABS 0.7 0.8 0.8 0.9 0.6*  MONOABS 0.3 0.3 0.8 0.3 0.2  EOSABS 0.0 0.0 0.0 0.0 0.0  BASOSABS 0.0 0.0 0.1 0.0 0.0    Chemistries  Recent Labs  Lab 10/16/19 0540 10/17/19 0435 10/18/19 0330 10/19/19 0134 10/20/19 0116  NA 141 140 137 145 147*  K 4.8 4.5 4.7 5.0 5.1  CL 110 106 91* 108 108  CO2 21* 28 31 28 29   GLUCOSE 129* 124* 180* 116* 144*  BUN 26* 21 59* 36* 44*  CREATININE 0.78 0.61 1.11* 0.86 0.95  CALCIUM 8.7* 9.2 9.6 8.7* 9.3  MG 1.7 2.1  --   --   --   AST 15 20 22 16 21   ALT 13 15 15 13 15   ALKPHOS 57 64 83 54 57  BILITOT 0.2* 0.5 1.3* 0.8 0.6   ------------------------------------------------------------------------------------------------------------------ No results for input(s): CHOL, HDL, LDLCALC, TRIG, CHOLHDL, LDLDIRECT in the last 72 hours.  Lab Results  Component Value Date   HGBA1C 7.0 (H) 10/16/2019   ------------------------------------------------------------------------------------------------------------------ No results for input(s): TSH, T4TOTAL, T3FREE, THYROIDAB in the last 72 hours.  Invalid input(s): FREET3 ------------------------------------------------------------------------------------------------------------------ Recent Labs    10/19/19 0134 10/20/19 0116  FERRITIN 301 293     Coagulation profile No results for input(s): INR, PROTIME in the last 168 hours.  Recent Labs    10/19/19 0134 10/20/19 0116  DDIMER 2.19* 2.01*    Cardiac Enzymes No results for input(s): CKMB, TROPONINI, MYOGLOBIN in the last 168 hours.  Invalid input(s): CK ------------------------------------------------------------------------------------------------------------------    Component Value Date/Time   BNP 219.0 (H) 10/15/2019 16100647    Micro Results Recent Results (from the past 240 hour(s))  Blood Culture (routine x 2)     Status: None   Collection Time: 10/15/19  8:55 AM   Specimen: BLOOD  Result Value Ref Range Status   Specimen Description BLOOD LEFT [facility]  Final   Special Requests   Final    BOTTLES DRAWN AEROBIC AND ANAEROBIC Blood Culture adequate volume   Culture   Final    NO GROWTH 5 DAYS Performed at [facility], [address] [place], [place] [address]    Report Status 10/20/2019 FINAL  Final  Blood Culture (routine x 2)     Status: None  Collection Time: 10/15/19  8:55 AM   Specimen: BLOOD  Result Value Ref Range Status   Specimen Description BLOOD LEFT HAND  Final   Special Requests   Final    BOTTLES DRAWN AEROBIC AND ANAEROBIC Blood Culture results may not be optimal due to an inadequate volume of blood received in culture bottles   Culture   Final    NO GROWTH 5 DAYS Performed at [facility], [address] [place], [place] [address]    Report Status 10/20/2019 FINAL  Final  MRSA PCR Screening     Status: None   Collection Time: 10/16/19  2:22 AM   Specimen: Nasal Mucosa; Nasopharyngeal  Result Value Ref Range Status   MRSA by PCR NEGATIVE NEGATIVE Final    Comment:        The GeneXpert MRSA Assay (FDA approved for NASAL specimens only), is one component of a comprehensive MRSA colonization surveillance program. It is not intended to diagnose MRSA infection nor to guide or monitor treatment for MRSA  infections. Performed at [facility], [address] [place], [place] [address]     Radiology Reports DG Chest Port 1 View  Result Date: 10/17/2019 CLINICAL DATA:  COVID-19 pneumonia EXAM: PORTABLE CHEST 1 VIEW COMPARISON:  10/15/2019 FINDINGS: Midline trachea. Mild cardiomegaly. Atherosclerosis in the transverse aorta. No pleural effusion or pneumothorax. Patchy left greater than right airspace disease is not significantly changed. Left perihilar and infrahilar. Medial right upper lobe and lateral right lung base. IMPRESSION: No significant change in left greater than right multifocal pneumonia. Aortic Atherosclerosis (ICD10-I70.0). Electronically Signed   By: Jeronimo Greaves M.D.   On: 10/17/2019 08:28   DG Chest Portable 1 View  Result Date: 10/15/2019 CLINICAL DATA:  Shortness of breath.  COVID-19 positive EXAM: PORTABLE CHEST 1 VIEW COMPARISON:  06/06/2019 FINDINGS: Left perihilar airspace disease. Cardiomegaly accentuated by leftward rotation. Surgical clips over the right mediastinum which are posterior mediastinal by 05/2019 CT. Hyperinflation. Remote right humeral neck fracture IMPRESSION: Left perihilar pneumonia. Electronically Signed   By: Marnee Spring M.D.   On: 10/15/2019 07:08

## 2019-10-21 LAB — COMPREHENSIVE METABOLIC PANEL
ALT: 18 U/L (ref 0–44)
AST: 21 U/L (ref 15–41)
Albumin: 3 g/dL — ABNORMAL LOW (ref 3.5–5.0)
Alkaline Phosphatase: 56 U/L (ref 38–126)
Anion gap: 9 (ref 5–15)
BUN: 39 mg/dL — ABNORMAL HIGH (ref 8–23)
CO2: 29 mmol/L (ref 22–32)
Calcium: 9 mg/dL (ref 8.9–10.3)
Chloride: 103 mmol/L (ref 98–111)
Creatinine, Ser: 0.73 mg/dL (ref 0.44–1.00)
GFR calc Af Amer: >60 mL/min (ref >60)
GFR calc non Af Amer: >60 mL/min (ref >60)
Glucose, Bld: 118 mg/dL — ABNORMAL HIGH (ref 70–99)
Potassium: 4 mmol/L (ref 3.5–5.1)
Sodium: 141 mmol/L (ref 135–145)
Total Bilirubin: 0.9 mg/dL (ref 0.3–1.2)
Total Protein: 6.8 g/dL (ref 6.5–8.1)

## 2019-10-21 LAB — CBC
HCT: 37.7 % (ref 36.0–46.0)
Hemoglobin: 11.4 g/dL — ABNORMAL LOW (ref 12.0–15.0)
MCH: 25.1 pg — ABNORMAL LOW (ref 26.0–34.0)
MCHC: 30.2 g/dL (ref 30.0–36.0)
MCV: 83 fL (ref 80.0–100.0)
Platelets: 225 10*3/uL (ref 150–400)
RBC: 4.54 MIL/uL (ref 3.87–5.11)
RDW: 14.8 % (ref 11.5–15.5)
WBC: 7.5 10*3/uL (ref 4.0–10.5)
nRBC: 0 % (ref 0.0–0.2)

## 2019-10-21 LAB — C-REACTIVE PROTEIN: CRP: 1.3 mg/dL — ABNORMAL HIGH (ref <1.0)

## 2019-10-21 LAB — GLUCOSE, CAPILLARY
Glucose-Capillary: 91 mg/dL (ref 70–99)
Glucose-Capillary: 203 mg/dL — ABNORMAL HIGH (ref 70–99)
Glucose-Capillary: 241 mg/dL — ABNORMAL HIGH (ref 70–99)
Glucose-Capillary: 231 mg/dL — ABNORMAL HIGH (ref 70–99)

## 2019-10-21 LAB — D-DIMER, QUANTITATIVE: D-Dimer, Quant: 1.66 ug/mL-FEU — ABNORMAL HIGH (ref 0.00–0.50)

## 2019-10-21 LAB — FERRITIN: Ferritin: 299 ng/mL (ref 11–307)

## 2019-10-21 NOTE — Progress Notes (Signed)
PROGRESS NOTE                                                                                                                                                                                                             Patient Demographics:    [name], is a 85 y.o. female, DOB - January 21, 1932, WUJ:[id number]  Outpatient Primary MD for the patient is [name], Marshall W, MD   Admit date - 10/15/2019   LOS - 6  No chief complaint on file.      Brief Narrative: Patient is a 85 y.o. female with PMHx of COPD on 2 L of oxygen at home, dementia, HTN, HLD, DM-2 who presented with shortness of breath-further evaluation revealed COVID-19 pneumonia.   Subjective:   Lying comfortably in bed-no major events overnight.   Assessment  & Plan :    Covid 19 Viral pneumonia: Remains stable-on either room air or on 2 L of oxygen.  Completed remdesivir on 12/12, remains on tapering steroids.  Inflammatory markers are down trending.    Fever: afebrile  O2 requirements:  SpO2: 98 % O2 Flow Rate (L/min): on ROOM air  COVID-19 Labs: Recent Labs    10/19/19 0134 10/20/19 0116 10/21/19 0845  DDIMER 2.19* 2.01* 1.66*  FERRITIN 301 293 299  CRP 5.8* 3.6* 1.3*       Component Value Date/Time   BNP 219.0 (H) 10/15/2019 0647    Recent Labs  Lab 10/15/19 0647  PROCALCITON <0.10    Lab Results  Component Value Date   SARSCOV2NAA NEGATIVE 05/23/2019     COVID-19 Medications: Remdesivir 12/7 > Decadron 12/7 >  Other medications: Diuretics:Euvolemic-hold Lasix today-has mild hypernatremia Antibiotics:Not needed as no evidence of bacterial infection  Prone/Incentive Spirometry: encouraged incentive spirometry use 3-4/hour.  DVT Prophylaxis  :  Lovenox   HTN: Relatively well-controlled-continue amlodipine, Lasix and metoprolol.   DM-2 (A1c 7.0): CBGs stable with SSI  CBG (last 3)  Recent Labs    10/20/19 2158  10/21/19 0741 10/21/19 1143  GLUCAP 156* 91 203*   GERD: Continue PPI  COPD with chronic hypoxic respiratory failure: Continue bronchodilators-stable on 2 L of oxygen.  Vascular dementia: Expect delirium-continue Depakote  Nutrition Problem: Nutrition Problem: Increased nutrient needs Etiology: acute illness(COVID-19 pneumonia) Signs/Symptoms: estimated needs Interventions: Ensure Enlive (each supplement provides 350kcal and 20 grams of protein),  Liberalize Diet, Magic cup  Consults  :  None  Procedures  :  None  ABG:    Component Value Date/Time   PHART 7.39 08/15/2018 1127   PCO2ART 48 08/15/2018 1127   PO2ART 75 (L) 08/15/2018 1127   HCO3 31.8 (H) 06/06/2019 1859   O2SAT 18.0 06/06/2019 1859    Vent Settings: N/A   Condition - Stable  Family Communication  :  Son updated over the phone on 12/13 Code Status :  Full Code  Diet :  Diet Order            Diet regular Room service appropriate? Yes; Fluid consistency: Thin  Diet effective now               Disposition Plan  :  Remain hospitalized-SNF on discharge-social work following.  Barriers to discharge: Hypoxia requiring O2 supplementation/complete 5 days of IV Remdesivir  Antimicorbials  :    Anti-infectives (From admission, onward)   Start     Dose/Rate Route Frequency Ordered Stop   10/17/19 1000  remdesivir 100 mg in sodium chloride 0.9 % 100 mL IVPB  Status:  Discontinued     100 mg 200 mL/hr over 30 Minutes Intravenous Daily 10/16/19 0311 10/16/19 1904   10/17/19 0400  remdesivir 100 mg in sodium chloride 0.9 % 100 mL IVPB     100 mg 200 mL/hr over 30 Minutes Intravenous Daily 10/16/19 1906 10/20/19 0940   10/16/19 2000  remdesivir 100 mg in sodium chloride 0.9 % 100 mL IVPB  Status:  Discontinued     100 mg 200 mL/hr over 30 Minutes Intravenous Daily 10/16/19 1904 10/16/19 1906   10/16/19 0400  remdesivir 200 mg in sodium chloride 0.9% 250 mL IVPB     200 mg 580 mL/hr over 30 Minutes  Intravenous Once 10/16/19 0311 10/16/19 0442      Inpatient Medications  Scheduled Meds: . amLODipine  5 mg Oral Daily  . aspirin EC  81 mg Oral Daily  . cholecalciferol  2,000 Units Oral Daily  . dexamethasone (DECADRON) injection  6 mg Intravenous Q24H  . dextromethorphan-guaiFENesin  1 tablet Oral BID  . dicyclomine  20 mg Oral QID  . divalproex  250 mg Oral QHS  . enoxaparin (LOVENOX) injection  40 mg Subcutaneous Q24H  . feeding supplement (ENSURE ENLIVE)  237 mL Oral BID BM  . insulin aspart  0-15 Units Subcutaneous TID WC  . insulin aspart  0-5 Units Subcutaneous QHS  . metoprolol tartrate  25 mg Oral BID  . multivitamin with minerals  1 tablet Oral Daily  . olopatadine  1 drop Both Eyes Daily  . pantoprazole  40 mg Oral Daily  . vitamin C  500 mg Oral Daily  . zinc sulfate  220 mg Oral Daily   Continuous Infusions:  PRN Meds:.acetaminophen, albuterol, alum & mag hydroxide-simeth, bismuth subsalicylate, chlorpheniramine-HYDROcodone, LORazepam, ondansetron **OR** ondansetron (ZOFRAN) IV, promethazine, QUEtiapine   Time Spent in minutes  25  See all Orders from today for further details   Oren Binet M.D on 10/21/2019 at 3:20 PM  To page go to [email or web address] - use universal password  [facility] -  Office  [phone]    Objective:   Vitals:   10/20/19 1500 10/20/19 1920 10/21/19 0524 10/21/19 0906  BP: (!) 135/59 (!) 180/71 140/68 128/62  Pulse: 77 73 83 75  Resp: 20 20 19 16   Temp: 98 F (36.7 C) 98 F (36.7 C) 97.9 F (36.6 C) 97.8  F (36.6 C)  TempSrc: Oral Oral Oral Oral  SpO2: 92% 95% 94% 98%  Weight:      Height:        Wt Readings from Last 3 Encounters:  10/16/19 76.3 kg  10/15/19 76.3 kg  06/06/19 80 kg     Intake/Output Summary (Last 24 hours) at 10/21/2019 1520 Last data filed at 10/21/2019 0600 Gross per 24 hour  Intake 360 ml  Output --  Net 360 ml     Physical Exam Gen Exam:Alert awake-not in any  distress HEENT:atraumatic, normocephalic Chest: B/L clear to auscultation anteriorly CVS:S1S2 regular Abdomen:soft non tender, non distended Extremities:no edema Neurology: Non focal Skin: no rash   Data Review:    CBC Recent Labs  Lab 10/16/19 0540 10/17/19 0435 10/18/19 0330 10/19/19 0134 10/20/19 0116 10/21/19 0845  WBC 3.4* 5.0 11.3* 3.7* 2.9* 7.5  HGB 10.1* 11.8* 13.8 10.8* 11.4* 11.4*  HCT 34.2* 39.9 42.6 37.8 39.1 37.7  PLT 122* 185 297 149* 183 225  MCV 83.4 84.7 93.0 87.7 85.6 83.0  MCH 24.6* 25.1* 30.1 25.1* 24.9* 25.1*  MCHC 29.5* 29.6* 32.4 28.6* 29.2* 30.2  RDW 15.4 15.6* 13.8 15.4 15.0 14.8  LYMPHSABS 0.7 0.8 0.8 0.9 0.6*  --   MONOABS 0.3 0.3 0.8 0.3 0.2  --   EOSABS 0.0 0.0 0.0 0.0 0.0  --   BASOSABS 0.0 0.0 0.1 0.0 0.0  --     Chemistries  Recent Labs  Lab 10/16/19 0540 10/17/19 0435 10/18/19 0330 10/19/19 0134 10/20/19 0116 10/21/19 0845  NA 141 140 137 145 147* 141  K 4.8 4.5 4.7 5.0 5.1 4.0  CL 110 106 91* 108 108 103  CO2 21* 28 31 28 29 29   GLUCOSE 129* 124* 180* 116* 144* 118*  BUN 26* 21 59* 36* 44* 39*  CREATININE 0.78 0.61 1.11* 0.86 0.95 0.73  CALCIUM 8.7* 9.2 9.6 8.7* 9.3 9.0  MG 1.7 2.1  --   --   --   --   AST 15 20 22 16 21 21   ALT 13 15 15 13 15 18   ALKPHOS 57 64 83 54 57 56  BILITOT 0.2* 0.5 1.3* 0.8 0.6 0.9   ------------------------------------------------------------------------------------------------------------------ No results for input(s): CHOL, HDL, LDLCALC, TRIG, CHOLHDL, LDLDIRECT in the last 72 hours.  Lab Results  Component Value Date   HGBA1C 7.0 (H) 10/16/2019   ------------------------------------------------------------------------------------------------------------------ No results for input(s): TSH, T4TOTAL, T3FREE, THYROIDAB in the last 72 hours.  Invalid input(s): FREET3 ------------------------------------------------------------------------------------------------------------------ Recent  Labs    10/20/19 0116 10/21/19 0845  FERRITIN 293 299    Coagulation profile No results for input(s): INR, PROTIME in the last 168 hours.  Recent Labs    10/20/19 0116 10/21/19 0845  DDIMER 2.01* 1.66*    Cardiac Enzymes No results for input(s): CKMB, TROPONINI, MYOGLOBIN in the last 168 hours.  Invalid input(s): CK ------------------------------------------------------------------------------------------------------------------    Component Value Date/Time   BNP 219.0 (H) 10/15/2019 14/12/20    Micro Results Recent Results (from the past 240 hour(s))  Blood Culture (routine x 2)     Status: None   Collection Time: 10/15/19  8:55 AM   Specimen: BLOOD  Result Value Ref Range Status   Specimen Description BLOOD LEFT [facility]  Final   Special Requests   Final    BOTTLES DRAWN AEROBIC AND ANAEROBIC Blood Culture adequate volume   Culture   Final    NO GROWTH 5 DAYS Performed at [facility], [address] [place], [place] [address]    Report Status 10/20/2019 FINAL  Final  Blood Culture (routine x 2)     Status: None   Collection Time: 10/15/19  8:55 AM   Specimen: BLOOD  Result Value Ref Range Status   Specimen Description BLOOD LEFT HAND  Final   Special Requests   Final    BOTTLES DRAWN AEROBIC AND ANAEROBIC Blood Culture results may not be optimal due to an inadequate volume of blood received in culture bottles   Culture   Final    NO GROWTH 5 DAYS Performed at [facility], [address] [place], [place] [address]    Report Status 10/20/2019 FINAL  Final  MRSA PCR Screening     Status: None   Collection Time: 10/16/19  2:22 AM   Specimen: Nasal Mucosa; Nasopharyngeal  Result Value Ref Range Status   MRSA by PCR NEGATIVE NEGATIVE Final    Comment:        The GeneXpert MRSA Assay (FDA approved for NASAL specimens only), is one component of a comprehensive MRSA colonization surveillance program. It is not intended to diagnose MRSA infection  nor to guide or monitor treatment for MRSA infections. Performed at [facility], [address], [place], [place] [address]     Radiology Reports DG Chest Port 1 View  Result Date: 10/17/2019 CLINICAL DATA:  COVID-19 pneumonia EXAM: PORTABLE CHEST 1 VIEW COMPARISON:  10/15/2019 FINDINGS: Midline trachea. Mild cardiomegaly. Atherosclerosis in the transverse aorta. No pleural effusion or pneumothorax. Patchy left greater than right airspace disease is not significantly changed. Left perihilar and infrahilar. Medial right upper lobe and lateral right lung base. IMPRESSION: No significant change in left greater than right multifocal pneumonia. Aortic Atherosclerosis (ICD10-I70.0). Electronically Signed   By: Jeronimo Greaves M.D.   On: 10/17/2019 08:28   DG Chest Portable 1 View  Result Date: 10/15/2019 CLINICAL DATA:  Shortness of breath.  COVID-19 positive EXAM: PORTABLE CHEST 1 VIEW COMPARISON:  06/06/2019 FINDINGS: Left perihilar airspace disease. Cardiomegaly accentuated by leftward rotation. Surgical clips over the right mediastinum which are posterior mediastinal by 05/2019 CT. Hyperinflation. Remote right humeral neck fracture IMPRESSION: Left perihilar pneumonia. Electronically Signed   By: Marnee Spring M.D.   On: 10/15/2019 07:08

## 2019-10-22 LAB — COMPREHENSIVE METABOLIC PANEL
ALT: 17 U/L (ref 0–44)
AST: 16 U/L (ref 15–41)
Albumin: 2.8 g/dL — ABNORMAL LOW (ref 3.5–5.0)
Alkaline Phosphatase: 49 U/L (ref 38–126)
Anion gap: 7 (ref 5–15)
BUN: 38 mg/dL — ABNORMAL HIGH (ref 8–23)
CO2: 31 mmol/L (ref 22–32)
Calcium: 8.9 mg/dL (ref 8.9–10.3)
Chloride: 102 mmol/L (ref 98–111)
Creatinine, Ser: 0.89 mg/dL (ref 0.44–1.00)
GFR calc Af Amer: >60 mL/min (ref >60)
GFR calc non Af Amer: 59 mL/min — ABNORMAL LOW (ref >60)
Glucose, Bld: 155 mg/dL — ABNORMAL HIGH (ref 70–99)
Potassium: 5.1 mmol/L (ref 3.5–5.1)
Sodium: 140 mmol/L (ref 135–145)
Total Bilirubin: 0.8 mg/dL (ref 0.3–1.2)
Total Protein: 6.3 g/dL — ABNORMAL LOW (ref 6.5–8.1)

## 2019-10-22 LAB — CBC
HCT: 35 % — ABNORMAL LOW (ref 36.0–46.0)
Hemoglobin: 10.5 g/dL — ABNORMAL LOW (ref 12.0–15.0)
MCH: 25 pg — ABNORMAL LOW (ref 26.0–34.0)
MCHC: 30 g/dL (ref 30.0–36.0)
MCV: 83.3 fL (ref 80.0–100.0)
Platelets: 194 10*3/uL (ref 150–400)
RBC: 4.2 MIL/uL (ref 3.87–5.11)
RDW: 14.6 % (ref 11.5–15.5)
WBC: 5.9 10*3/uL (ref 4.0–10.5)
nRBC: 0 % (ref 0.0–0.2)

## 2019-10-22 LAB — GLUCOSE, CAPILLARY
Glucose-Capillary: 197 mg/dL — ABNORMAL HIGH (ref 70–99)
Glucose-Capillary: 121 mg/dL — ABNORMAL HIGH (ref 70–99)
Glucose-Capillary: 195 mg/dL — ABNORMAL HIGH (ref 70–99)
Glucose-Capillary: 273 mg/dL — ABNORMAL HIGH (ref 70–99)
Glucose-Capillary: 238 mg/dL — ABNORMAL HIGH (ref 70–99)

## 2019-10-22 LAB — D-DIMER, QUANTITATIVE: D-Dimer, Quant: 0.98 ug/mL-FEU — ABNORMAL HIGH (ref 0.00–0.50)

## 2019-10-22 LAB — C-REACTIVE PROTEIN: CRP: 0.8 mg/dL (ref <1.0)

## 2019-10-22 LAB — FERRITIN: Ferritin: 334 ng/mL — ABNORMAL HIGH (ref 11–307)

## 2019-10-22 MED ORDER — DEXAMETHASONE SODIUM PHOSPHATE 4 MG/ML IJ SOLN
4.0000 mg | INTRAMUSCULAR | Status: DC
  Administered 2019-10-23 – 2019-10-24 (×2): 4 mg via INTRAVENOUS
  Filled 2019-10-22 (×2): qty 1

## 2019-10-22 MED ORDER — LOPERAMIDE HCL 2 MG PO CAPS
2.0000 mg | ORAL_CAPSULE | ORAL | Status: DC | PRN
  Administered 2019-10-22 – 2019-10-24 (×4): 2 mg via ORAL
  Filled 2019-10-22 (×3): qty 1

## 2019-10-22 NOTE — Progress Notes (Signed)
PROGRESS NOTE                                                                                                                                                                                                             Patient Demographics:    [name], is a 85 y.o. female, DOB - 11-07-1932, [id number]  Outpatient Primary MD for the patient is Kirk Ruths, MD   Admit date - 10/15/2019   LOS - 7  No chief complaint on file.      Brief Narrative: Patient is a 85 y.o. female with PMHx of COPD on 2 L of oxygen at home, dementia, HTN, HLD, DM-2 who presented with shortness of breath-further evaluation revealed COVID-19 pneumonia.   Subjective:  Awake-claims she had a few bowel movements overnight but RN denies any diarrhea.  She appears to be mildly-but very pleasantly confused.   Assessment  & Plan :    Covid 19 Viral pneumonia: Remains stable-on either room air or on 2 L of oxygen.  Completed remdesivir on 12/12, remains on tapering steroids.  Inflammatory markers are down trending.    Fever: afebrile  O2 requirements:  SpO2: 98 % O2 Flow Rate (L/min): on ROOM air  COVID-19 Labs: Recent Labs    10/20/19 0116 10/21/19 0845 10/22/19 0245  DDIMER 2.01* 1.66* 0.98*  FERRITIN 293 299 334*  CRP 3.6* 1.3* 0.8       Component Value Date/Time   BNP 219.0 (H) 10/15/2019 0647    No results for input(s): PROCALCITON in the last 168 hours.  Lab Results  Component Value Date   SARSCOV2NAA NEGATIVE 05/23/2019     COVID-19 Medications: Remdesivir 12/7 > Decadron 12/7 >  Other medications: Diuretics:Euvolemic-hold Lasix today-has mild hypernatremia Antibiotics:Not needed as no evidence of bacterial infection  Prone/Incentive Spirometry: encouraged incentive spirometry use 3-4/hour.  DVT Prophylaxis  :  Lovenox   HTN: Relatively well-controlled-continue amlodipine, Lasix and metoprolol.   DM-2  (A1c 7.0): CBGs stable with SSI  CBG (last 3)  Recent Labs    10/21/19 2146 10/21/19 2342 10/22/19 0722  GLUCAP 231* 197* 121*   GERD: Continue PPI  COPD with chronic hypoxic respiratory failure: Continue bronchodilators-stable on 2 L of oxygen.  Vascular dementia: Expect delirium-continue Depakote  Nutrition Problem: Nutrition Problem: Increased nutrient needs Etiology: acute illness(COVID-19 pneumonia) Signs/Symptoms: estimated  needs Interventions: Ensure Enlive (each supplement provides 350kcal and 20 grams of protein), Liberalize Diet, Magic cup  Consults  :  None  Procedures  :  None  ABG:    Component Value Date/Time   PHART 7.39 08/15/2018 1127   PCO2ART 48 08/15/2018 1127   PO2ART 75 (L) 08/15/2018 1127   HCO3 31.8 (H) 06/06/2019 1859   O2SAT 18.0 06/06/2019 1859    Vent Settings: N/A   Condition - Stable  Family Communication  :  Son updated over the phone on 12/13 Code Status :  Full Code  Diet :  Diet Order            Diet regular Room service appropriate? Yes; Fluid consistency: Thin  Diet effective now               Disposition Plan  :  Remain hospitalized-SNF on discharge-social work following.  Barriers to discharge: Hypoxia requiring O2 supplementation/complete 5 days of IV Remdesivir  Antimicorbials  :    Anti-infectives (From admission, onward)   Start     Dose/Rate Route Frequency Ordered Stop   10/17/19 1000  remdesivir 100 mg in sodium chloride 0.9 % 100 mL IVPB  Status:  Discontinued     100 mg 200 mL/hr over 30 Minutes Intravenous Daily 10/16/19 0311 10/16/19 1904   10/17/19 0400  remdesivir 100 mg in sodium chloride 0.9 % 100 mL IVPB     100 mg 200 mL/hr over 30 Minutes Intravenous Daily 10/16/19 1906 10/20/19 0940   10/16/19 2000  remdesivir 100 mg in sodium chloride 0.9 % 100 mL IVPB  Status:  Discontinued     100 mg 200 mL/hr over 30 Minutes Intravenous Daily 10/16/19 1904 10/16/19 1906   10/16/19 0400  remdesivir 200 mg  in sodium chloride 0.9% 250 mL IVPB     200 mg 580 mL/hr over 30 Minutes Intravenous Once 10/16/19 0311 10/16/19 0442      Inpatient Medications  Scheduled Meds: . amLODipine  5 mg Oral Daily  . aspirin EC  81 mg Oral Daily  . cholecalciferol  2,000 Units Oral Daily  . dexamethasone (DECADRON) injection  6 mg Intravenous Q24H  . dextromethorphan-guaiFENesin  1 tablet Oral BID  . dicyclomine  20 mg Oral QID  . divalproex  250 mg Oral QHS  . enoxaparin (LOVENOX) injection  40 mg Subcutaneous Q24H  . feeding supplement (ENSURE ENLIVE)  237 mL Oral BID BM  . insulin aspart  0-15 Units Subcutaneous TID WC  . insulin aspart  0-5 Units Subcutaneous QHS  . metoprolol tartrate  25 mg Oral BID  . multivitamin with minerals  1 tablet Oral Daily  . olopatadine  1 drop Both Eyes Daily  . pantoprazole  40 mg Oral Daily  . vitamin C  500 mg Oral Daily  . zinc sulfate  220 mg Oral Daily   Continuous Infusions:  PRN Meds:.acetaminophen, albuterol, alum & mag hydroxide-simeth, bismuth subsalicylate, chlorpheniramine-HYDROcodone, loperamide, LORazepam, ondansetron **OR** ondansetron (ZOFRAN) IV, promethazine, QUEtiapine   Time Spent in minutes  25  See all Orders from today for further details   Jeoffrey Massed M.D on 10/22/2019 at 2:54 PM  To page go to [email or web address] - use universal password  [facility] -  Office  [phone]    Objective:   Vitals:   10/21/19 0906 10/21/19 1945 10/22/19 0500 10/22/19 0723  BP: 128/62 135/89 (!) 166/71 (!) 158/40  Pulse: 75 (!) 55 77 (!) 54  Resp: 16 20 (!)  22 20  Temp: 97.8 F (36.6 C) 97.6 F (36.4 C) 97.6 F (36.4 C) 97.8 F (36.6 C)  TempSrc: Oral Oral Axillary Oral  SpO2: 98% 96% 94% 90%  Weight:      Height:        Wt Readings from Last 3 Encounters:  10/16/19 76.3 kg  10/15/19 76.3 kg  06/06/19 80 kg     Intake/Output Summary (Last 24 hours) at 10/22/2019 1454 Last data filed at 10/22/2019 1247 Gross per 24 hour    Intake 1220 ml  Output 0 ml  Net 1220 ml     Physical Exam Gen Exam:Alert awake-not in any distress HEENT:atraumatic, normocephalic Chest: B/L clear to auscultation anteriorly CVS:S1S2 regular Abdomen:soft non tender, non distended Extremities:no edema Neurology: Non focal Skin: no rash   Data Review:    CBC Recent Labs  Lab 10/16/19 0540 10/17/19 0435 10/18/19 0330 10/19/19 0134 10/20/19 0116 10/21/19 0845 10/22/19 0245  WBC 3.4* 5.0 11.3* 3.7* 2.9* 7.5 5.9  HGB 10.1* 11.8* 13.8 10.8* 11.4* 11.4* 10.5*  HCT 34.2* 39.9 42.6 37.8 39.1 37.7 35.0*  PLT 122* 185 297 149* 183 225 194  MCV 83.4 84.7 93.0 87.7 85.6 83.0 83.3  MCH 24.6* 25.1* 30.1 25.1* 24.9* 25.1* 25.0*  MCHC 29.5* 29.6* 32.4 28.6* 29.2* 30.2 30.0  RDW 15.4 15.6* 13.8 15.4 15.0 14.8 14.6  LYMPHSABS 0.7 0.8 0.8 0.9 0.6*  --   --   MONOABS 0.3 0.3 0.8 0.3 0.2  --   --   EOSABS 0.0 0.0 0.0 0.0 0.0  --   --   BASOSABS 0.0 0.0 0.1 0.0 0.0  --   --     Chemistries  Recent Labs  Lab 10/16/19 0540 10/17/19 0435 10/18/19 0330 10/19/19 0134 10/20/19 0116 10/21/19 0845 10/22/19 0245  NA 141 140 137 145 147* 141 140  K 4.8 4.5 4.7 5.0 5.1 4.0 5.1  CL 110 106 91* 108 108 103 102  CO2 21* 28 31 28 29 29 31   GLUCOSE 129* 124* 180* 116* 144* 118* 155*  BUN 26* 21 59* 36* 44* 39* 38*  CREATININE 0.78 0.61 1.11* 0.86 0.95 0.73 0.89  CALCIUM 8.7* 9.2 9.6 8.7* 9.3 9.0 8.9  MG 1.7 2.1  --   --   --   --   --   AST 15 20 22 16 21 21 16   ALT 13 15 15 13 15 18 17   ALKPHOS 57 64 83 54 57 56 49  BILITOT 0.2* 0.5 1.3* 0.8 0.6 0.9 0.8   ------------------------------------------------------------------------------------------------------------------ No results for input(s): CHOL, HDL, LDLCALC, TRIG, CHOLHDL, LDLDIRECT in the last 72 hours.  Lab Results  Component Value Date   HGBA1C 7.0 (H) 10/16/2019    ------------------------------------------------------------------------------------------------------------------ No results for input(s): TSH, T4TOTAL, T3FREE, THYROIDAB in the last 72 hours.  Invalid input(s): FREET3 ------------------------------------------------------------------------------------------------------------------ Recent Labs    10/21/19 0845 10/22/19 0245  FERRITIN 299 334*    Coagulation profile No results for input(s): INR, PROTIME in the last 168 hours.  Recent Labs    10/21/19 0845 10/22/19 0245  DDIMER 1.66* 0.98*    Cardiac Enzymes No results for input(s): CKMB, TROPONINI, MYOGLOBIN in the last 168 hours.  Invalid input(s): CK ------------------------------------------------------------------------------------------------------------------    Component Value Date/Time   BNP 219.0 (H) 10/15/2019 [id number]    Micro Results Recent Results (from the past 240 hour(s))  Blood Culture (routine x 2)     Status: None   Collection Time: 10/15/19  8:55 AM  Specimen: BLOOD  Result Value Ref Range Status   Specimen Description BLOOD LEFT AC  Final   Special Requests   Final    BOTTLES DRAWN AEROBIC AND ANAEROBIC Blood Culture adequate volume   Culture   Final    NO GROWTH 5 DAYS Performed at [facility], [address] [place], [place] [address]    Report Status 10/20/2019 FINAL  Final  Blood Culture (routine x 2)     Status: None   Collection Time: 10/15/19  8:55 AM   Specimen: BLOOD  Result Value Ref Range Status   Specimen Description BLOOD LEFT HAND  Final   Special Requests   Final    BOTTLES DRAWN AEROBIC AND ANAEROBIC Blood Culture results may not be optimal due to an inadequate volume of blood received in culture bottles   Culture   Final    NO GROWTH 5 DAYS Performed at [facility], [address] [place], [place] [address]    Report Status 10/20/2019 FINAL  Final  MRSA PCR Screening     Status: None   Collection  Time: 10/16/19  2:22 AM   Specimen: Nasal Mucosa; Nasopharyngeal  Result Value Ref Range Status   MRSA by PCR NEGATIVE NEGATIVE Final    Comment:        The GeneXpert MRSA Assay (FDA approved for NASAL specimens only), is one component of a comprehensive MRSA colonization surveillance program. It is not intended to diagnose MRSA infection nor to guide or monitor treatment for MRSA infections. Performed at [facility], [address] [place], [place] [address]     Radiology Reports DG Chest Port 1 View  Result Date: 10/17/2019 CLINICAL DATA:  COVID-19 pneumonia EXAM: PORTABLE CHEST 1 VIEW COMPARISON:  10/15/2019 FINDINGS: Midline trachea. Mild cardiomegaly. Atherosclerosis in the transverse aorta. No pleural effusion or pneumothorax. Patchy left greater than right airspace disease is not significantly changed. Left perihilar and infrahilar. Medial right upper lobe and lateral right lung base. IMPRESSION: No significant change in left greater than right multifocal pneumonia. Aortic Atherosclerosis (ICD10-I70.0). Electronically Signed   By: Jeronimo Greaves M.D.   On: 10/17/2019 08:28   DG Chest Portable 1 View  Result Date: 10/15/2019 CLINICAL DATA:  Shortness of breath.  COVID-19 positive EXAM: PORTABLE CHEST 1 VIEW COMPARISON:  06/06/2019 FINDINGS: Left perihilar airspace disease. Cardiomegaly accentuated by leftward rotation. Surgical clips over the right mediastinum which are posterior mediastinal by 05/2019 CT. Hyperinflation. Remote right humeral neck fracture IMPRESSION: Left perihilar pneumonia. Electronically Signed   By: Marnee Spring M.D.   On: 10/15/2019 07:08

## 2019-10-23 DIAGNOSIS — J9621 Acute and chronic respiratory failure with hypoxia: Secondary | ICD-10-CM

## 2019-10-23 LAB — GLUCOSE, CAPILLARY
Glucose-Capillary: 96 mg/dL (ref 70–99)
Glucose-Capillary: 152 mg/dL — ABNORMAL HIGH (ref 70–99)
Glucose-Capillary: 162 mg/dL — ABNORMAL HIGH (ref 70–99)
Glucose-Capillary: 157 mg/dL — ABNORMAL HIGH (ref 70–99)

## 2019-10-23 MED ORDER — INSULIN ASPART 100 UNIT/ML ~~LOC~~ SOLN: SUBCUTANEOUS | 11 refills | Status: DC

## 2019-10-23 MED ORDER — ENSURE ENLIVE PO LIQD: 237.0000 mL | Freq: Two times a day (BID) | ORAL | 12 refills | Status: DC

## 2019-10-23 MED ORDER — LORAZEPAM 0.5 MG PO TABS: 0.2500 mg | ORAL_TABLET | Freq: Two times a day (BID) | ORAL | 0 refills | Status: DC | PRN

## 2019-10-23 NOTE — NC FL2 (Signed)
[place] LEVEL OF CARE SCREENING TOOL     IDENTIFICATION  Patient Name: [name] Birthdate: 1932/05/14 Sex: female Admission Date (Current Location): 10/15/2019  [facility] and [place] Number:  Herbalist and Address:  [facility], [place] [address], [place], [place] [address]([address]      Provider Number: [id number]  Attending Physician Name and Address:  Jonetta Osgood, MD  Relative Name and Phone Number:  Pandora Leiter; Lorann Tani  [phone]    Current Level of Care: Hospital Recommended Level of Care: Memory Care Prior Approval Number:    Date Approved/Denied:   PASRR Number: [id number]  Discharge Plan: Other (Comment)([facility])    Current Diagnoses: Patient Active Problem List   Diagnosis Date Noted  . Pneumonia due to COVID-19 virus 10/16/2019  . Elevated brain natriuretic peptide (BNP) level 10/16/2019  . Lactic acidosis 10/16/2019  . Acute on chronic respiratory failure with hypoxia ([place]) 10/15/2019  . Suspected COVID-19 virus infection 10/15/2019  . HTN (hypertension) 10/15/2019  . Chronic diastolic (congestive) heart failure ([place]) 10/15/2019  . Hyperkalemia 10/15/2019  . CKD (chronic kidney disease), stage IIIa 10/15/2019  . Hypoxia 05/23/2019  . Acute delirium 08/16/2018  . Dementia with behavioral disturbance ([place]) 08/16/2018  . Encephalopathy acute 08/11/2018  . Sepsis ([place]) 05/21/2017  . Aspiration pneumonia ([place]) 05/21/2017  . GERD (gastroesophageal reflux disease) 05/21/2017  . HLD (hyperlipidemia) 05/21/2017  . Depression 05/21/2017  . Anxiety 05/21/2017  . Elevated troponin 05/21/2017  . Diabetes ([place]) 05/21/2017  . Right humeral fracture 05/21/2017  . Nausea and vomiting 03/31/2015  . Microcytic anemia 03/31/2015    Orientation RESPIRATION BLADDER Height & Weight     Self  Normal Incontinent Weight: 76.3 kg Height:  5\' 5"  (165.1 cm)  BEHAVIORAL SYMPTOMS/MOOD  NEUROLOGICAL BOWEL NUTRITION STATUS      Incontinent Diet  AMBULATORY STATUS COMMUNICATION OF NEEDS Skin   Extensive Assist Verbally Normal                       Personal Care Assistance Level of Assistance  Total care, Dressing, Bathing, Feeding Bathing Assistance: Maximum assistance Feeding assistance: Maximum assistance Dressing Assistance: Maximum assistance Total Care Assistance: Maximum assistance   Functional Limitations Info             SPECIAL CARE FACTORS FREQUENCY  PT (By licensed PT), OT (By licensed OT)     PT Frequency: 5x/week OT Frequency: min 3x/week            Contractures Contractures Info: Not present    Additional Factors Info  Code Status Code Status Info: DNR Allergies Info: Ampicillin, sulfa antibiotics           Current Medications (10/23/2019):  This is the current hospital active medication list Current Facility-Administered Medications  Medication Dose Route Frequency Provider Last Rate Last Admin  . acetaminophen (TYLENOL) tablet 650 mg  650 mg Oral Q4H PRN Cherene Altes, MD   650 mg at 10/22/19 2211  . albuterol (VENTOLIN HFA) 108 (90 Base) MCG/ACT inhaler 2 puff  2 puff Inhalation Q6H PRN Adefeso, Oladapo, DO   2 puff at 10/21/19 0918  . alum & mag hydroxide-simeth (MAALOX/MYLANTA) 200-200-20 MG/5ML suspension 30 mL  30 mL Oral PRN Cherene Altes, MD   30 mL at 10/17/19 0137  . amLODipine (NORVASC) tablet 5 mg  5 mg Oral Daily Cherene Altes, MD   5 mg at  10/23/19 0845  . aspirin EC tablet 81 mg  81 mg Oral Daily Lonia Blood, MD   81 mg at 10/23/19 7124  . bismuth subsalicylate (PEPTO BISMOL) 262 MG/15ML suspension 15 mL  15 mL Oral Q2H PRN Lonia Blood, MD      . chlorpheniramine-HYDROcodone (TUSSIONEX) 10-8 MG/5ML suspension 5 mL  5 mL Oral Q12H PRN Adefeso, Oladapo, DO   5 mL at 10/18/19 1253  . cholecalciferol (VITAMIN D3) tablet 2,000 Units  2,000 Units Oral Daily Lonia Blood, MD   2,000 Units  at 10/23/19 [phone]  . dexamethasone (DECADRON) injection 4 mg  4 mg Intravenous Q24H Maretta Bees, MD   4 mg at 10/23/19 0837  . dextromethorphan-guaiFENesin (MUCINEX DM) 30-600 MG per 12 hr tablet 1 tablet  1 tablet Oral BID Adefeso, Oladapo, DO   1 tablet at 10/23/19 0839  . dicyclomine (BENTYL) tablet 20 mg  20 mg Oral QID Lonia Blood, MD   20 mg at 10/23/19 0839  . divalproex (DEPAKOTE ER) 24 hr tablet 250 mg  250 mg Oral QHS Lonia Blood, MD   250 mg at 10/22/19 2143  . enoxaparin (LOVENOX) injection 40 mg  40 mg Subcutaneous Q24H Adefeso, Oladapo, DO   40 mg at 10/23/19 0550  . feeding supplement (ENSURE ENLIVE) (ENSURE ENLIVE) liquid 237 mL  237 mL Oral BID BM Lonia Blood, MD   237 mL at 10/23/19 0839  . insulin aspart (novoLOG) injection 0-15 Units  0-15 Units Subcutaneous TID WC Adefeso, Oladapo, DO   8 Units at 10/22/19 1736  . insulin aspart (novoLOG) injection 0-5 Units  0-5 Units Subcutaneous QHS Adefeso, Oladapo, DO   2 Units at 10/22/19 2237  . loperamide (IMODIUM) capsule 2 mg  2 mg Oral PRN Maretta Bees, MD   2 mg at 10/22/19 1013  . LORazepam (ATIVAN) tablet 0.25 mg  0.25 mg Oral Q6H PRN Lonia Blood, MD   0.25 mg at 10/23/19 0835  . multivitamin with minerals tablet 1 tablet  1 tablet Oral Daily Lonia Blood, MD   1 tablet at 10/23/19 [phone]  . olopatadine (PATANOL) 0.1 % ophthalmic solution 1 drop  1 drop Both Eyes Daily Lonia Blood, MD   1 drop at 10/23/19 [phone]  . ondansetron (ZOFRAN) tablet 4 mg  4 mg Oral Q6H PRN Adefeso, Oladapo, DO   4 mg at 10/22/19 1736   Or  . ondansetron (ZOFRAN) injection 4 mg  4 mg Intravenous Q6H PRN Adefeso, Oladapo, DO   4 mg at 10/20/19 2018  . pantoprazole (PROTONIX) EC tablet 40 mg  40 mg Oral Daily Adefeso, Oladapo, DO   40 mg at 10/23/19 0842  . promethazine (PHENERGAN) injection 12.5 mg  12.5 mg Intravenous Q6H PRN Maretta Bees, MD   12.5 mg at 10/20/19 2057  . QUEtiapine (SEROQUEL) tablet  25 mg  25 mg Oral BID PRN Maretta Bees, MD   25 mg at 10/21/19 2134  . vitamin C (ASCORBIC ACID) tablet 500 mg  500 mg Oral Daily Adefeso, Oladapo, DO   500 mg at 10/23/19 0841  . zinc sulfate capsule 220 mg  220 mg Oral Daily Adefeso, Oladapo, DO   220 mg at 10/23/19 0840     Discharge Medications: Please see discharge summary for a list of discharge medications.  Relevant Imaging Results:  Relevant Lab Results:   Additional Information SSN: [id number]  Durenda Guthrie, RN

## 2019-10-23 NOTE — Progress Notes (Signed)
Both myself and PT updated son on status and D/c to possible rehab

## 2019-10-23 NOTE — Care Management Important Message (Signed)
Important Message  Patient Details  Name: [name] MRN: [id number] Date of Birth: 07-07-1932   Medicare Important Message Given:  Yes - Important Message mailed due to current National Emergency  Verbal consent obtained due to current National Emergency  Relationship to patient: Child Contact Name: [name] Call Date: 10/23/19  Time: 1117 Phone: [phone] Outcome: No Answer/Busy Important Message mailed to: Patient address on file    Delorse Lek 10/23/2019, 11:18 AM

## 2019-10-23 NOTE — Discharge Summary (Signed)
PATIENT DETAILS Name: [name] Age: 85 y.o. Sex: female Date of Birth: 1932-07-05 MRN: [id number]. Admitting Physician: Cherene Altes, MD [id number], Ocie Cornfield, MD  Admit Date: 10/15/2019 Discharge date: 10/23/2019  Recommendations for Outpatient Follow-up:  1. Follow up with PCP in 1-2 weeks 2. Please obtain CMP/CBC in one week 3. Repeat Chest Xray in 4-6 week  Admitted From:  ALF   Disposition: SNF   Home Health: No  Equipment/Devices: None  Discharge Condition: Stable  CODE STATUS: DNR  Diet recommendation:  Diet Order            Diet - low sodium heart healthy        Diet Carb Modified        Diet regular Room service appropriate? Yes; Fluid consistency: Thin  Diet effective now               Brief Summary: See H&P, Labs, Consult and Test reports for all details in brief,Patient is a 85 y.o. female with PMHx of COPD on 2 L of oxygen at home, dementia, HTN, HLD, DM-2 who presented with shortness of breath-further evaluation revealed COVID-19 pneumonia.  Brief Hospital Course:  Covid 19 Viral pneumonia: Remains stable-on either room air or on 2 L of oxygen.  Completed remdesivir on 12/12, remains on tapering steroids.  Inflammatory markers are down trending.    COVID-19 Labs:  Recent Labs    10/21/19 0845 10/22/19 0245  DDIMER 1.66* 0.98*  FERRITIN 299 334*  CRP 1.3* 0.8    Lab Results  Component Value Date   SARSCOV2NAA NEGATIVE 05/23/2019    Per H&P:tested positive for Covid 19 virus on 10/12/2019 for assisted living facility   COVID-19 Medications: Remdesivir 12/7 >12/12 Decadron 12/7 >12/15  Other medications: Diuretics:Euvolemic-hold Lasix today-has mild hypernatremia Antibiotics:Not needed as no evidence of bacterial infection  HTN: Relatively well-controlled-continue amlodipine, Lasix and metoprolol.   DM-2 (A1c 7.0): CBGs stable with SSI  GERD: Continue PPI  COPD with chronic hypoxic respiratory  failure: Continue bronchodilators-stable on 2 L of oxygen.  Vascular dementia: Expect delirium-continue Depakote  Nutrition Problem: Nutrition Problem: Increased nutrient needs Etiology: acute illness(COVID-19 pneumonia) Signs/Symptoms: estimated needs Interventions: Ensure Enlive (each supplement provides 350kcal and 20 grams of protein), Liberalize Diet, Magic cup  Procedures/Studies: None  Discharge Diagnoses:  Principal Problem:   Pneumonia due to COVID-19 virus Active Problems:   GERD (gastroesophageal reflux disease)   HLD (hyperlipidemia)   Depression   Anxiety   Acute on chronic respiratory failure with hypoxia (HCC)   HTN (hypertension)   Hyperkalemia   Elevated brain natriuretic peptide (BNP) level   Lactic acidosis   Discharge Instructions:    Person Under Monitoring Name: Jesenia Spera  Location: [facility] [address] [place] [address]   Infection Prevention Recommendations for Individuals Confirmed to have, or Being Evaluated for, 2019 Novel Coronavirus (COVID-19) Infection Who Receive Care at Home  Individuals who are confirmed to have, or are being evaluated for, COVID-19 should follow the prevention steps below until a healthcare provider or local or state health department says they can return to normal activities.  Stay home except to get medical care You should restrict activities outside your home, except for getting medical care. Do not go to work, school, or public areas, and do not use public transportation or taxis.  Call ahead before visiting your doctor Before your medical appointment, call the healthcare provider and tell them that you have, or are being evaluated  for, COVID-19 infection. This will help the healthcare provider's office take steps to keep other people from getting infected. Ask your healthcare provider to call the local or state health department.  Monitor your symptoms Seek prompt medical attention if  your illness is worsening (e.g., difficulty breathing). Before going to your medical appointment, call the healthcare provider and tell them that you have, or are being evaluated for, COVID-19 infection. Ask your healthcare provider to call the local or state health department.  Wear a facemask You should wear a facemask that covers your nose and mouth when you are in the same room with other people and when you visit a healthcare provider. People who live with or visit you should also wear a facemask while they are in the same room with you.  Separate yourself from other people in your home As much as possible, you should stay in a different room from other people in your home. Also, you should use a separate bathroom, if available.  Avoid sharing household items You should not share dishes, drinking glasses, cups, eating utensils, towels, bedding, or other items with other people in your home. After using these items, you should wash them thoroughly with soap and water.  Cover your coughs and sneezes Cover your mouth and nose with a tissue when you cough or sneeze, or you can cough or sneeze into your sleeve. Throw used tissues in a lined trash can, and immediately wash your hands with soap and water for at least 20 seconds or use an alcohol-based hand rub.  Wash your [employer] your hands often and thoroughly with soap and water for at least 20 seconds. You can use an alcohol-based hand sanitizer if soap and water are not available and if your hands are not visibly dirty. Avoid touching your eyes, nose, and mouth with unwashed hands.   Prevention Steps for Caregivers and Household Members of Individuals Confirmed to have, or Being Evaluated for, COVID-19 Infection Being Cared for in the Home  If you live with, or provide care at home for, a person confirmed to have, or being evaluated for, COVID-19 infection please follow these guidelines to prevent infection:  Follow healthcare  provider's instructions Make sure that you understand and can help the patient follow any healthcare provider instructions for all care.  Provide for the patient's basic needs You should help the patient with basic needs in the home and provide support for getting groceries, prescriptions, and other personal needs.  Monitor the patient's symptoms If they are getting sicker, call his or her medical provider and tell them that the patient has, or is being evaluated for, COVID-19 infection. This will help the healthcare provider's office take steps to keep other people from getting infected. Ask the healthcare provider to call the local or state health department.  Limit the number of people who have contact with the patient  If possible, have only one caregiver for the patient.  Other household members should stay in another home or place of residence. If this is not possible, they should stay  in another room, or be separated from the patient as much as possible. Use a separate bathroom, if available.  Restrict visitors who do not have an essential need to be in the home.  Keep older adults, very young children, and other sick people away from the patient Keep older adults, very young children, and those who have compromised immune systems or chronic health conditions away from the patient. This includes people  with chronic heart, lung, or kidney conditions, diabetes, and cancer.  Ensure good ventilation Make sure that shared spaces in the home have good air flow, such as from an air conditioner or an opened window, weather permitting.  Wash your hands often  Wash your hands often and thoroughly with soap and water for at least 20 seconds. You can use an alcohol based hand sanitizer if soap and water are not available and if your hands are not visibly dirty.  Avoid touching your eyes, nose, and mouth with unwashed hands.  Use disposable paper towels to dry your hands. If not  available, use dedicated cloth towels and replace them when they become wet.  Wear a facemask and gloves  Wear a disposable facemask at all times in the room and gloves when you touch or have contact with the patient's blood, body fluids, and/or secretions or excretions, such as sweat, saliva, sputum, nasal mucus, vomit, urine, or feces.  Ensure the mask fits over your nose and mouth tightly, and do not touch it during use.  Throw out disposable facemasks and gloves after using them. Do not reuse.  Wash your hands immediately after removing your facemask and gloves.  If your personal clothing becomes contaminated, carefully remove clothing and launder. Wash your hands after handling contaminated clothing.  Place all used disposable facemasks, gloves, and other waste in a lined container before disposing them with other household waste.  Remove gloves and wash your hands immediately after handling these items.  Do not share dishes, glasses, or other household items with the patient  Avoid sharing household items. You should not share dishes, drinking glasses, cups, eating utensils, towels, bedding, or other items with a patient who is confirmed to have, or being evaluated for, COVID-19 infection.  After the person uses these items, you should wash them thoroughly with soap and water.  Wash laundry thoroughly  Immediately remove and wash clothes or bedding that have blood, body fluids, and/or secretions or excretions, such as sweat, saliva, sputum, nasal mucus, vomit, urine, or feces, on them.  Wear gloves when handling laundry from the patient.  Read and follow directions on labels of laundry or clothing items and detergent. In general, wash and dry with the warmest temperatures recommended on the label.  Clean all areas the individual has used often  Clean all touchable surfaces, such as counters, tabletops, doorknobs, bathroom fixtures, toilets, phones, keyboards, tablets, and  bedside tables, every day. Also, clean any surfaces that may have blood, body fluids, and/or secretions or excretions on them.  Wear gloves when cleaning surfaces the patient has come in contact with.  Use a diluted bleach solution (e.g., dilute bleach with 1 part bleach and 10 parts water) or a household disinfectant with a label that says EPA-registered for coronaviruses. To make a bleach solution at home, add 1 tablespoon of bleach to 1 quart (4 cups) of water. For a larger supply, add  cup of bleach to 1 gallon (16 cups) of water.  Read labels of cleaning products and follow recommendations provided on product labels. Labels contain instructions for safe and effective use of the cleaning product including precautions you should take when applying the product, such as wearing gloves or eye protection and making sure you have good ventilation during use of the product.  Remove gloves and wash hands immediately after cleaning.  Monitor yourself for signs and symptoms of illness Caregivers and household members are considered close contacts, should monitor their health, and will be  asked to limit movement outside of the home to the extent possible. Follow the monitoring steps for close contacts listed on the symptom monitoring form.   ? If you have additional questions, contact your local health department or call the epidemiologist on call at [phone] (available 24/7). ? This guidance is subject to change. For the most up-to-date guidance from CDC, please refer to their website: [employer]Met[employer].hu    Activity:  As tolerated with Full fall precautions use walker/cane & assistance as needed  Discharge Instructions    Call MD for:  difficulty breathing, headache or visual disturbances   Complete by: As directed    Call MD for:  persistant dizziness or light-headedness   Complete by: As directed    Call MD for:  persistant nausea  and vomiting   Complete by: As directed    Diet - low sodium heart healthy   Complete by: As directed    Diet Carb Modified   Complete by: As directed    Discharge instructions   Complete by: As directed    Follow with Primary MD  Lauro Regulus, MD in 1-2 weeks  Please get a complete blood count and chemistry panel checked by your Primary MD at your next visit, and again as instructed by your Primary MD.  Get Medicines reviewed and adjusted: Please take all your medications with you for your next visit with your Primary MD  Laboratory/radiological data: Please request your Primary MD to go over all hospital tests and procedure/radiological results at the follow up, please ask your Primary MD to get all Hospital records sent to his/her office.  In some cases, they will be blood work, cultures and biopsy results pending at the time of your discharge. Please request that your primary care M.D. follows up on these results.  Also Note the following: If you experience worsening of your admission symptoms, develop shortness of breath, life threatening emergency, suicidal or homicidal thoughts you must seek medical attention immediately by calling 911 or calling your MD immediately  if symptoms less severe.  You must read complete instructions/literature along with all the possible adverse reactions/side effects for all the Medicines you take and that have been prescribed to you. Take any new Medicines after you have completely understood and accpet all the possible adverse reactions/side effects.   Do not drive when taking Pain medications or sleeping medications (Benzodaizepines)  Do not take more than prescribed Pain, Sleep and Anxiety Medications. It is not advisable to combine anxiety,sleep and pain medications without talking with your primary care practitioner  Special Instructions: If you have smoked or chewed Tobacco  in the last 2 yrs please stop smoking, stop any regular  Alcohol  and or any Recreational drug use.  Wear Seat belts while driving.  Please note: You were cared for by a hospitalist during your hospital stay. Once you are discharged, your primary care physician will handle any further medical issues. Please note that NO REFILLS for any discharge medications will be authorized once you are discharged, as it is imperative that you return to your primary care physician (or establish a relationship with a primary care physician if you do not have one) for your post hospital discharge needs so that they can reassess your need for medications and monitor your lab values.   Check CBGs before meals and at bedtime   Increase activity slowly   Complete by: As directed      Allergies as of 10/23/2019  Reactions   Ampicillin    Zpac   Sulfa Antibiotics       Medication List    STOP taking these medications   diphenoxylate-atropine 2.5-0.025 MG tablet Commonly known as: LOMOTIL   telmisartan 40 MG tablet Commonly known as: MICARDIS     TAKE these medications   acetaminophen 500 MG tablet Commonly known as: TYLENOL Take 1,000 mg by mouth 3 (three) times daily.   acetaminophen 325 MG tablet Commonly known as: TYLENOL Take 650 mg by mouth every 4 (four) hours as needed for fever.   alum & mag hydroxide-simeth 200-200-20 MG/5ML suspension Commonly known as: MAALOX/MYLANTA Take 30 mLs by mouth as needed for indigestion or heartburn.   amLODipine 5 MG tablet Commonly known as: NORVASC Take 5 mg by mouth daily.   aspirin EC 81 MG tablet Take 81 mg by mouth daily.   bismuth subsalicylate 262 MG/15ML suspension Commonly known as: PEPTO BISMOL Take 262 mg by mouth every 2 (two) hours as needed for indigestion (up to 6 doses in 24 hr).   calcium carbonate 500 MG chewable tablet Commonly known as: TUMS - dosed in mg elemental calcium Chew 2 tablets (400 mg of elemental calcium total) by mouth 3 (three) times daily as needed for indigestion  or heartburn.   cetirizine 10 MG tablet Commonly known as: ZYRTEC Take 10 mg by mouth daily.   dicyclomine 20 MG tablet Commonly known as: BENTYL Take 20 mg by mouth 4 (four) times daily.   divalproex 250 MG 24 hr tablet Commonly known as: DEPAKOTE ER Take 250 mg by mouth at bedtime.   esomeprazole 20 MG capsule Commonly known as: NEXIUM Take 20 mg by mouth daily.   feeding supplement (ENSURE ENLIVE) Liqd Take 237 mLs by mouth 2 (two) times daily between meals.   furosemide 20 MG tablet Commonly known as: LASIX Take 1 tablet (20 mg total) by mouth daily.   hydrocortisone cream 1 % Apply 1 application topically every 6 (six) hours as needed for itching.   insulin aspart 100 UNIT/ML injection Commonly known as: novoLOG 0-15 Units, Subcutaneous, 3 times daily with meals, CBG < 70: Implement Hypoglycemia measures CBG 70 - 120: 0 units CBG 121 - 150: 2 units CBG 151 - 200: 3 units CBG 201 - 250: 5 units CBG 251 - 300: 8 units CBG 301 - 350: 11 units CBG 351 - 400: 15 units CBG > 400: call MD What changed: additional instructions   ipratropium-albuterol 0.5-2.5 (3) MG/3ML Soln Commonly known as: DUONEB Take 3 mLs by nebulization 2 (two) times daily.   ketotifen 0.025 % ophthalmic solution Commonly known as: ZADITOR Place 1 drop into both eyes 2 (two) times daily.   loperamide 2 MG tablet Commonly known as: IMODIUM A-D Take 2 mg by mouth as needed for diarrhea or loose stools (up to 4 doses in 12 hrs.). What changed: Another medication with the same name was removed. Continue taking this medication, and follow the directions you see here.   LORazepam 0.5 MG tablet Commonly known as: ATIVAN Take 0.5 tablets (0.25 mg total) by mouth 2 (two) times daily as needed for anxiety. What changed: reasons to take this   magnesium hydroxide 400 MG/5ML suspension Commonly known as: MILK OF MAGNESIA Take 30 mLs by mouth daily as needed for mild constipation.   metFORMIN 500  MG 24 hr tablet Commonly known as: GLUCOPHAGE-XR Take 500 mg by mouth 2 (two) times a day.   metoprolol tartrate 25 MG  tablet Commonly known as: LOPRESSOR Take 1 tablet (25 mg total) by mouth 2 (two) times daily.   multivitamin with minerals Tabs tablet Take 1 tablet by mouth daily.   nystatin powder Generic drug: nystatin Apply 1 g topically 2 (two) times daily as needed (to affected area(s)).   olopatadine 0.1 % ophthalmic solution Commonly known as: PATANOL Place 1 drop into both eyes daily.   ondansetron 4 MG tablet Commonly known as: ZOFRAN Take 4 mg by mouth every 8 (eight) hours as needed for nausea or vomiting.   psyllium 58.6 % powder Commonly known as: METAMUCIL Take 1 packet by mouth 2 (two) times a day.   QUEtiapine 25 MG tablet Commonly known as: SEROQUEL Take 1 tablet (25 mg total) by mouth at bedtime as needed (psychosis, insomnia). What changed:   how much to take  when to take this   ROBITUSSIN CF PO Take 10 mLs by mouth every 6 (six) hours as needed (cough).   sodium phosphate Pediatric 3.5-9.5 GM/59ML enema Place 1 enema rectally once as needed for severe constipation.   Vitamin D 50 MCG (2000 UT) tablet Take 2,000 Units by mouth daily.      Follow-up Information    Lauro Regulus, MD. Schedule an appointment as soon as possible for a visit in 1 week(s).   Specialty: Internal Medicine Contact information: [address] [facility] [place] [address] [phone]          Allergies  Allergen Reactions  . Ampicillin     Zpac  . Sulfa Antibiotics      Consultations:   None   Other Procedures/Studies: DG Chest Port 1 View  Result Date: 10/17/2019 CLINICAL DATA:  COVID-19 pneumonia EXAM: PORTABLE CHEST 1 VIEW COMPARISON:  10/15/2019 FINDINGS: Midline trachea. Mild cardiomegaly. Atherosclerosis in the transverse aorta. No pleural effusion or pneumothorax. Patchy left greater than right airspace  disease is not significantly changed. Left perihilar and infrahilar. Medial right upper lobe and lateral right lung base. IMPRESSION: No significant change in left greater than right multifocal pneumonia. Aortic Atherosclerosis (ICD10-I70.0). Electronically Signed   By: Jeronimo Greaves M.D.   On: 10/17/2019 08:28   DG Chest Portable 1 View  Result Date: 10/15/2019 CLINICAL DATA:  Shortness of breath.  COVID-19 positive EXAM: PORTABLE CHEST 1 VIEW COMPARISON:  06/06/2019 FINDINGS: Left perihilar airspace disease. Cardiomegaly accentuated by leftward rotation. Surgical clips over the right mediastinum which are posterior mediastinal by 05/2019 CT. Hyperinflation. Remote right humeral neck fracture IMPRESSION: Left perihilar pneumonia. Electronically Signed   By: Marnee Spring M.D.   On: 10/15/2019 07:08     TODAY-DAY OF DISCHARGE:  Subjective:   Aesha Agrawal today has no headache,no chest abdominal pain,no new weakness tingling or numbness, feels much better wants to go home today.   Objective:   Blood pressure (!) 150/51, pulse 60, temperature 98 F (36.7 C), temperature source Oral, resp. rate 18, height  (1.651 m), weight 76.3 kg, SpO2 94 %.  Intake/Output Summary (Last 24 hours) at 10/23/2019 1054 Last data filed at 10/23/2019 0631 Gross per 24 hour  Intake 810 ml  Output 500 ml  Net 310 ml   Filed Weights   10/16/19 0017  Weight: 76.3 kg    Exam: Awake  No new F.N deficits, Normal affect [place].AT,PERRAL Supple Neck,No JVD, No cervical lymphadenopathy appriciated.  Symmetrical Chest wall movement, Good air movement bilaterally, CTAB RRR,No Gallops,Rubs or new Murmurs, No Parasternal Heave +ve B.Sounds, Abd  Soft, Non tender, No organomegaly appriciated, No rebound -guarding or rigidity. No Cyanosis, Clubbing or edema, No new Rash or bruise   PERTINENT RADIOLOGIC STUDIES: DG Chest Port 1 View  Result Date: 10/17/2019 CLINICAL DATA:  COVID-19 pneumonia EXAM: PORTABLE CHEST 1  VIEW COMPARISON:  10/15/2019 FINDINGS: Midline trachea. Mild cardiomegaly. Atherosclerosis in the transverse aorta. No pleural effusion or pneumothorax. Patchy left greater than right airspace disease is not significantly changed. Left perihilar and infrahilar. Medial right upper lobe and lateral right lung base. IMPRESSION: No significant change in left greater than right multifocal pneumonia. Aortic Atherosclerosis (ICD10-I70.0). Electronically Signed   By: Jeronimo Greaves M.D.   On: 10/17/2019 08:28   DG Chest Portable 1 View  Result Date: 10/15/2019 CLINICAL DATA:  Shortness of breath.  COVID-19 positive EXAM: PORTABLE CHEST 1 VIEW COMPARISON:  06/06/2019 FINDINGS: Left perihilar airspace disease. Cardiomegaly accentuated by leftward rotation. Surgical clips over the right mediastinum which are posterior mediastinal by 05/2019 CT. Hyperinflation. Remote right humeral neck fracture IMPRESSION: Left perihilar pneumonia. Electronically Signed   By: Marnee Spring M.D.   On: 10/15/2019 07:08     PERTINENT LAB RESULTS: CBC: Recent Labs    10/21/19 0845 10/22/19 0245  WBC 7.5 5.9  HGB 11.4* 10.5*  HCT 37.7 35.0*  PLT 225 194   CMET CMP     Component Value Date/Time   NA 140 10/22/2019 0245   NA 143 06/18/2014 0448   K 5.1 10/22/2019 0245   K 3.2 (L) 06/18/2014 0448   CL 102 10/22/2019 0245   CL 110 (H) 06/18/2014 0448   CO2 31 10/22/2019 0245   CO2 24 06/18/2014 0448   GLUCOSE 155 (H) 10/22/2019 0245   GLUCOSE 157 (H) 06/18/2014 0448   BUN 38 (H) 10/22/2019 0245   BUN 11 06/18/2014 0448   CREATININE 0.89 10/22/2019 0245   CREATININE 0.74 06/18/2014 0448   CALCIUM 8.9 10/22/2019 0245   CALCIUM 8.3 (L) 06/18/2014 0448   PROT 6.3 (L) 10/22/2019 0245   PROT 7.3 06/17/2014 1259   ALBUMIN 2.8 (L) 10/22/2019 0245   ALBUMIN 3.3 (L) 06/17/2014 1259   AST 16 10/22/2019 0245   AST 14 (L) 06/17/2014 1259   ALT 17 10/22/2019 0245   ALT 18 06/17/2014 1259   ALKPHOS 49 10/22/2019 0245    ALKPHOS 109 06/17/2014 1259   BILITOT 0.8 10/22/2019 0245   BILITOT 0.4 06/17/2014 1259   GFRNONAA 59 (L) 10/22/2019 0245   GFRNONAA >60 06/18/2014 0448   GFRAA >60 10/22/2019 0245   GFRAA >60 06/18/2014 0448    GFR Estimated Creatinine Clearance: 47.2 mL/min (by C-G formula based on SCr of 0.89 mg/dL). No results for input(s): LIPASE, AMYLASE in the last 72 hours. No results for input(s): CKTOTAL, CKMB, CKMBINDEX, TROPONINI in the last 72 hours. Invalid input(s): POCBNP Recent Labs    10/21/19 0845 10/22/19 0245  DDIMER 1.66* 0.98*   No results for input(s): HGBA1C in the last 72 hours. No results for input(s): CHOL, HDL, LDLCALC, TRIG, CHOLHDL, LDLDIRECT in the last 72 hours. No results for input(s): TSH, T4TOTAL, T3FREE, THYROIDAB in the last 72 hours.  Invalid input(s): FREET3 Recent Labs    10/21/19 0845 10/22/19 0245  FERRITIN 299 334*   Coags: No results for input(s): INR in the last 72 hours.  Invalid input(s): PT Microbiology: Recent Results (from the past 240 hour(s))  Blood Culture (routine x 2)     Status: None   Collection Time: 10/15/19  8:55 AM  Specimen: BLOOD  Result Value Ref Range Status   Specimen Description BLOOD LEFT AC  Final   Special Requests   Final    BOTTLES DRAWN AEROBIC AND ANAEROBIC Blood Culture adequate volume   Culture   Final    NO GROWTH 5 DAYS Performed at [facility], [address] [place], [place] [address]    Report Status 10/20/2019 FINAL  Final  Blood Culture (routine x 2)     Status: None   Collection Time: 10/15/19  8:55 AM   Specimen: BLOOD  Result Value Ref Range Status   Specimen Description BLOOD LEFT HAND  Final   Special Requests   Final    BOTTLES DRAWN AEROBIC AND ANAEROBIC Blood Culture results may not be optimal due to an inadequate volume of blood received in culture bottles   Culture   Final    NO GROWTH 5 DAYS Performed at [facility], [address] [place], [place]  [address]    Report Status 10/20/2019 FINAL  Final  MRSA PCR Screening     Status: None   Collection Time: 10/16/19  2:22 AM   Specimen: Nasal Mucosa; Nasopharyngeal  Result Value Ref Range Status   MRSA by PCR NEGATIVE NEGATIVE Final    Comment:        The GeneXpert MRSA Assay (FDA approved for NASAL specimens only), is one component of a comprehensive MRSA colonization surveillance program. It is not intended to diagnose MRSA infection nor to guide or monitor treatment for MRSA infections. Performed at [facility], [address] [place], [place] [address]     FURTHER DISCHARGE INSTRUCTIONS:  Get Medicines reviewed and adjusted: Please take all your medications with you for your next visit with your Primary MD  Laboratory/radiological data: Please request your Primary MD to go over all hospital tests and procedure/radiological results at the follow up, please ask your Primary MD to get all Hospital records sent to his/her office.  In some cases, they will be blood work, cultures and biopsy results pending at the time of your discharge. Please request that your primary care M.D. goes through all the records of your hospital data and follows up on these results.  Also Note the following: If you experience worsening of your admission symptoms, develop shortness of breath, life threatening emergency, suicidal or homicidal thoughts you must seek medical attention immediately by calling 911 or calling your MD immediately  if symptoms less severe.  You must read complete instructions/literature along with all the possible adverse reactions/side effects for all the Medicines you take and that have been prescribed to you. Take any new Medicines after you have completely understood and accpet all the possible adverse reactions/side effects.   Do not drive when taking Pain medications or sleeping medications (Benzodaizepines)  Do not take more than prescribed Pain,  Sleep and Anxiety Medications. It is not advisable to combine anxiety,sleep and pain medications without talking with your primary care practitioner  Special Instructions: If you have smoked or chewed Tobacco  in the last 2 yrs please stop smoking, stop any regular Alcohol  and or any Recreational drug use.  Wear Seat belts while driving.  Please note: You were cared for by a hospitalist during your hospital stay. Once you are discharged, your primary care physician will handle any further medical issues. Please note that NO REFILLS for any discharge medications will be authorized once you are discharged, as it is imperative that you return to your  primary care physician (or establish a relationship with a primary care physician if you do not have one) for your post hospital discharge needs so that they can reassess your need for medications and monitor your lab values.  Total Time spent coordinating discharge including counseling, education and face to face time equals 35 minutes.  SignedJeoffrey Massed 10/23/2019 10:54 AM

## 2019-10-24 LAB — GLUCOSE, CAPILLARY
Glucose-Capillary: 121 mg/dL — ABNORMAL HIGH (ref 70–99)
Glucose-Capillary: 193 mg/dL — ABNORMAL HIGH (ref 70–99)
Glucose-Capillary: 304 mg/dL — ABNORMAL HIGH (ref 70–99)

## 2019-10-24 NOTE — TOC Transition Note (Signed)
Transition of Care [facility]) - CM/SW Discharge Note   Patient Details  Name: [name] MRN: [id number] Date of Birth: Oct 23, 1932  [facility] CM/SW Contact:  Ninfa Meeker, RN Phone Number:  [phone] (working remotely) 10/24/2019, 1:18 PM   Clinical Narrative:    Patient will DC to : [facility] date: 10/24/19  Family Notified: Son: [name]  Transport by: Corey Harold: 3pm   Patient is medically ready for discharge to SNF per MD. Case manager has confirmed with Gerald Stabs at [facility] that bed is available. Bedside RN, Charge nurse and facility are aware of discharge. Discharge Summary, sent to the facility via the hub.Bedside RN to call report to [phone], patient going to [address]. Medical necessity and facesheet printed to nursing unit. Ambulance transport has been requested.   Final next level of care: [place] Barriers to Discharge: No Barriers Identified   Patient Goals and CMS Choice     Choice offered to / list presented to : Adult Children  Discharge Placement                       Discharge Plan and Services   Discharge Planning Services: CM Consult                      HH Arranged: NA HH Agency: NA        Social Determinants of Health (SDOH) Interventions     Readmission Risk Interventions Readmission Risk Prevention Plan 05/28/2019 05/27/2019 05/24/2019  Transportation Screening Complete Complete Complete  Social Work Consult for [place] Planning/Counseling - Complete -  Medication Review Press photographer) Complete Complete Complete  PCP or Specialist appointment within 3-5 days of discharge (No Data) - -  [place] or Home Care Consult Complete - -  Palliative Care Screening Not Applicable - -  [place] Not Applicable - -  Some recent data might be hidden

## 2019-10-24 NOTE — Progress Notes (Signed)
Manuela Schwartz said to release PT without report given because I have called 5 times and there is no answer. She will have someone call me later for reprt. However, release PT for transport.according to Ricki Miller

## 2019-10-24 NOTE — Progress Notes (Signed)
PROGRESS NOTE                                                                                                                                                                                                             Patient Demographics:    [name], is a 85 y.o. female, DOB - 10/08/1932, ZOX:[id number]  Outpatient Primary MD for the patient is Lauro Regulus, MD   Admit date - 10/15/2019   LOS - 9  No chief complaint on file.      Brief Narrative: Patient is a 85 y.o. female with PMHx of COPD on 2 L of oxygen at home, dementia, HTN, HLD, DM-2 who presented with shortness of breath-further evaluation revealed COVID-19 pneumonia.   Subjective:   Pleasantly confused-lying comfortably in bed.  Not in any distress.  Awaiting SNF bed.    Assessment  & Plan :    Covid 19 Viral pneumonia: Remains stable-on either room air or on 2 L of oxygen.  Completed remdesivir on 12/12, remains on tapering steroids.  Inflammatory markers are down trending.    Fever: afebrile  O2 requirements:  SpO2: 98 % O2 Flow Rate (L/min): on ROOM air  COVID-19 Labs: Recent Labs    10/22/19 0245  DDIMER 0.98*  FERRITIN 334*  CRP 0.8       Component Value Date/Time   BNP 219.0 (H) 10/15/2019 0647    No results for input(s): PROCALCITON in the last 168 hours.  Lab Results  Component Value Date   SARSCOV2NAA NEGATIVE 05/23/2019     COVID-19 Medications: Remdesivir 12/7 > Decadron 12/7 >  Other medications: Diuretics:Euvolemic-hold Lasix today-has mild hypernatremia Antibiotics:Not needed as no evidence of bacterial infection  Prone/Incentive Spirometry: encouraged incentive spirometry use 3-4/hour.  DVT Prophylaxis  :  Lovenox   HTN: Relatively well-controlled-continue amlodipine, Lasix and metoprolol.   DM-2 (A1c 7.0): CBGs stable with SSI  CBG (last 3)  Recent Labs    10/23/19 1924 10/24/19 0809  10/24/19 1111  GLUCAP 157* 121* 193*   GERD: Continue PPI  COPD with chronic hypoxic respiratory failure: Continue bronchodilators-stable on 2 L of oxygen.  Vascular dementia: Expect delirium-continue Depakote  Nutrition Problem: Nutrition Problem: Increased nutrient needs Etiology: acute illness(COVID-19 pneumonia) Signs/Symptoms: estimated needs Interventions: Ensure Enlive (each supplement provides 350kcal and 20 grams of protein), Liberalize Diet, Magic  cup  Consults  :  None  Procedures  :  None  ABG:    Component Value Date/Time   PHART 7.39 08/15/2018 1127   PCO2ART 48 08/15/2018 1127   PO2ART 75 (L) 08/15/2018 1127   HCO3 31.8 (H) 06/06/2019 1859   O2SAT 18.0 06/06/2019 1859    Vent Settings: N/A   Condition - Stable  Family Communication  :  Son updated over the phone on 12/13 Code Status :  Full Code  Diet :  Diet Order            Diet - low sodium heart healthy        Diet Carb Modified        Diet regular Room service appropriate? Yes; Fluid consistency: Thin  Diet effective now               Disposition Plan  :  Remain hospitalized-awaiting SNF bed.  Barriers to discharge: Awaiting SNF bed.  Antimicorbials  :    Anti-infectives (From admission, onward)   Start     Dose/Rate Route Frequency Ordered Stop   10/17/19 1000  remdesivir 100 mg in sodium chloride 0.9 % 100 mL IVPB  Status:  Discontinued     100 mg 200 mL/hr over 30 Minutes Intravenous Daily 10/16/19 0311 10/16/19 1904   10/17/19 0400  remdesivir 100 mg in sodium chloride 0.9 % 100 mL IVPB     100 mg 200 mL/hr over 30 Minutes Intravenous Daily 10/16/19 1906 10/20/19 0940   10/16/19 2000  remdesivir 100 mg in sodium chloride 0.9 % 100 mL IVPB  Status:  Discontinued     100 mg 200 mL/hr over 30 Minutes Intravenous Daily 10/16/19 1904 10/16/19 1906   10/16/19 0400  remdesivir 200 mg in sodium chloride 0.9% 250 mL IVPB     200 mg 580 mL/hr over 30 Minutes Intravenous Once 10/16/19  0311 10/16/19 0442      Inpatient Medications  Scheduled Meds: . amLODipine  5 mg Oral Daily  . aspirin EC  81 mg Oral Daily  . cholecalciferol  2,000 Units Oral Daily  . dexamethasone (DECADRON) injection  4 mg Intravenous Q24H  . dextromethorphan-guaiFENesin  1 tablet Oral BID  . dicyclomine  20 mg Oral QID  . divalproex  250 mg Oral QHS  . enoxaparin (LOVENOX) injection  40 mg Subcutaneous Q24H  . feeding supplement (ENSURE ENLIVE)  237 mL Oral BID BM  . insulin aspart  0-15 Units Subcutaneous TID WC  . insulin aspart  0-5 Units Subcutaneous QHS  . multivitamin with minerals  1 tablet Oral Daily  . olopatadine  1 drop Both Eyes Daily  . pantoprazole  40 mg Oral Daily  . vitamin C  500 mg Oral Daily  . zinc sulfate  220 mg Oral Daily   Continuous Infusions:  PRN Meds:.acetaminophen, albuterol, alum & mag hydroxide-simeth, bismuth subsalicylate, chlorpheniramine-HYDROcodone, loperamide, LORazepam, ondansetron **OR** ondansetron (ZOFRAN) IV, promethazine, QUEtiapine   Time Spent in minutes  15  See all Orders from today for further details   Jeoffrey MassedShanker Blanka Rockholt M.D on 10/24/2019 at 12:55 PM  To page go to [email or web address] - use universal password  [facility] -  Office  [phone]    Objective:   Vitals:   10/23/19 1923 10/23/19 1947 10/24/19 0451 10/24/19 0800  BP:  (!) 123/54 (!) 140/47 (!) 129/48  Pulse:  (!) 59 71 (!) 47  Resp:   19 16  Temp: 98.7 F (37.1 C) 98.7 F (  37.1 C) 99.2 F (37.3 C) 99.1 F (37.3 C)  TempSrc: Oral Oral Oral Axillary  SpO2:  97% 99% 96%  Weight:      Height:        Wt Readings from Last 3 Encounters:  10/16/19 76.3 kg  10/15/19 76.3 kg  06/06/19 80 kg     Intake/Output Summary (Last 24 hours) at 10/24/2019 1255 Last data filed at 10/23/2019 2316 Gross per 24 hour  Intake 240 ml  Output --  Net 240 ml     Physical Exam Gen Exam:Alert awake-not in any distress HEENT:atraumatic, normocephalic Chest: B/L clear to  auscultation anteriorly CVS:S1S2 regular Abdomen:soft non tender, non distended Extremities:no edema Neurology: Non focal Skin: no rash   Data Review:    CBC Recent Labs  Lab 10/18/19 0330 10/19/19 0134 10/20/19 0116 10/21/19 0845 10/22/19 0245  WBC 11.3* 3.7* 2.9* 7.5 5.9  HGB 13.8 10.8* 11.4* 11.4* 10.5*  HCT 42.6 37.8 39.1 37.7 35.0*  PLT 297 149* 183 225 194  MCV 93.0 87.7 85.6 83.0 83.3  MCH 30.1 25.1* 24.9* 25.1* 25.0*  MCHC 32.4 28.6* 29.2* 30.2 30.0  RDW 13.8 15.4 15.0 14.8 14.6  LYMPHSABS 0.8 0.9 0.6*  --   --   MONOABS 0.8 0.3 0.2  --   --   EOSABS 0.0 0.0 0.0  --   --   BASOSABS 0.1 0.0 0.0  --   --     Chemistries  Recent Labs  Lab 10/18/19 0330 10/19/19 0134 10/20/19 0116 10/21/19 0845 10/22/19 0245  NA 137 145 147* 141 140  K 4.7 5.0 5.1 4.0 5.1  CL 91* 108 108 103 102  CO2 31 28 29 29 31   GLUCOSE 180* 116* 144* 118* 155*  BUN 59* 36* 44* 39* 38*  CREATININE 1.11* 0.86 0.95 0.73 0.89  CALCIUM 9.6 8.7* 9.3 9.0 8.9  AST 22 16 21 21 16   ALT 15 13 15 18 17   ALKPHOS 83 54 57 56 49  BILITOT 1.3* 0.8 0.6 0.9 0.8   ------------------------------------------------------------------------------------------------------------------ No results for input(s): CHOL, HDL, LDLCALC, TRIG, CHOLHDL, LDLDIRECT in the last 72 hours.  Lab Results  Component Value Date   HGBA1C 7.0 (H) 10/16/2019   ------------------------------------------------------------------------------------------------------------------ No results for input(s): TSH, T4TOTAL, T3FREE, THYROIDAB in the last 72 hours.  Invalid input(s): FREET3 ------------------------------------------------------------------------------------------------------------------ Recent Labs    10/22/19 0245  FERRITIN 334*    Coagulation profile No results for input(s): INR, PROTIME in the last 168 hours.  Recent Labs    10/22/19 0245  DDIMER 0.98*    Cardiac Enzymes No results for input(s): CKMB,  TROPONINI, MYOGLOBIN in the last 168 hours.  Invalid input(s): CK ------------------------------------------------------------------------------------------------------------------    Component Value Date/Time   BNP 219.0 (H) 10/15/2019 10/24/19    Micro Results Recent Results (from the past 240 hour(s))  Blood Culture (routine x 2)     Status: None   Collection Time: 10/15/19  8:55 AM   Specimen: BLOOD  Result Value Ref Range Status   Specimen Description BLOOD LEFT [facility]  Final   Special Requests   Final    BOTTLES DRAWN AEROBIC AND ANAEROBIC Blood Culture adequate volume   Culture   Final    NO GROWTH 5 DAYS Performed at [facility], [address], [place], [facility] [address]    Report Status 10/20/2019 FINAL  Final  Blood Culture (routine x 2)     Status: None   Collection Time: 10/15/19  8:55 AM  Specimen: BLOOD  Result Value Ref Range Status   Specimen Description BLOOD LEFT HAND  Final   Special Requests   Final    BOTTLES DRAWN AEROBIC AND ANAEROBIC Blood Culture results may not be optimal due to an inadequate volume of blood received in culture bottles   Culture   Final    NO GROWTH 5 DAYS Performed at [facility], [address] [place], [place] [address]    Report Status 10/20/2019 FINAL  Final  MRSA PCR Screening     Status: None   Collection Time: 10/16/19  2:22 AM   Specimen: Nasal Mucosa; Nasopharyngeal  Result Value Ref Range Status   MRSA by PCR NEGATIVE NEGATIVE Final    Comment:        The GeneXpert MRSA Assay (FDA approved for NASAL specimens only), is one component of a comprehensive MRSA colonization surveillance program. It is not intended to diagnose MRSA infection nor to guide or monitor treatment for MRSA infections. Performed at [facility], [place] [address] [place], [place] [address]     Radiology Reports DG Chest Port 1 View  Result Date: 10/17/2019 CLINICAL DATA:  COVID-19 pneumonia EXAM:  PORTABLE CHEST 1 VIEW COMPARISON:  10/15/2019 FINDINGS: Midline trachea. Mild cardiomegaly. Atherosclerosis in the transverse aorta. No pleural effusion or pneumothorax. Patchy left greater than right airspace disease is not significantly changed. Left perihilar and infrahilar. Medial right upper lobe and lateral right lung base. IMPRESSION: No significant change in left greater than right multifocal pneumonia. Aortic Atherosclerosis (ICD10-I70.0). Electronically Signed   By: Abigail Miyamoto M.D.   On: 10/17/2019 08:28   DG Chest Portable 1 View  Result Date: 10/15/2019 CLINICAL DATA:  Shortness of breath.  COVID-19 positive EXAM: PORTABLE CHEST 1 VIEW COMPARISON:  06/06/2019 FINDINGS: Left perihilar airspace disease. Cardiomegaly accentuated by leftward rotation. Surgical clips over the right mediastinum which are posterior mediastinal by 05/2019 CT. Hyperinflation. Remote right humeral neck fracture IMPRESSION: Left perihilar pneumonia. Electronically Signed   By: Monte Fantasia M.D.   On: 10/15/2019 07:08

## 2020-04-14 ENCOUNTER — Emergency Department
Admission: EM | Admit: 2020-04-14 | Discharge: 2020-04-14 | Disposition: A | Discharge status: Skilled Nursing Facility | Payer: Medicare Other | Attending: Emergency Medicine | Admitting: Emergency Medicine

## 2020-04-14 ENCOUNTER — Other Ambulatory Visit: Payer: Self-pay

## 2020-04-14 DIAGNOSIS — E1122 Type 2 diabetes mellitus with diabetic chronic kidney disease: Secondary | ICD-10-CM | POA: Diagnosis not present

## 2020-04-14 DIAGNOSIS — I129 Hypertensive chronic kidney disease with stage 1 through stage 4 chronic kidney disease, or unspecified chronic kidney disease: Secondary | ICD-10-CM | POA: Insufficient documentation

## 2020-04-14 DIAGNOSIS — N1831 Chronic kidney disease, stage 3a: Secondary | ICD-10-CM | POA: Diagnosis not present

## 2020-04-14 DIAGNOSIS — N179 Acute kidney failure, unspecified: Secondary | ICD-10-CM | POA: Diagnosis not present

## 2020-04-14 DIAGNOSIS — R350 Frequency of micturition: Secondary | ICD-10-CM | POA: Diagnosis present

## 2020-04-14 DIAGNOSIS — R1110 Vomiting, unspecified: Secondary | ICD-10-CM | POA: Diagnosis not present

## 2020-04-14 DIAGNOSIS — Z794 Long term (current) use of insulin: Secondary | ICD-10-CM | POA: Diagnosis not present

## 2020-04-14 DIAGNOSIS — R1084 Generalized abdominal pain: Secondary | ICD-10-CM | POA: Insufficient documentation

## 2020-04-14 DIAGNOSIS — F0390 Unspecified dementia without behavioral disturbance: Secondary | ICD-10-CM | POA: Diagnosis not present

## 2020-04-14 DIAGNOSIS — N390 Urinary tract infection, site not specified: Secondary | ICD-10-CM | POA: Insufficient documentation

## 2020-04-14 DIAGNOSIS — Z79899 Other long term (current) drug therapy: Secondary | ICD-10-CM | POA: Diagnosis not present

## 2020-04-14 LAB — CBC
HCT: 36.3 % (ref 36.0–46.0)
Hemoglobin: 11 g/dL — ABNORMAL LOW (ref 12.0–15.0)
MCH: 26 pg (ref 26.0–34.0)
MCHC: 30.3 g/dL (ref 30.0–36.0)
MCV: 85.8 fL (ref 80.0–100.0)
Platelets: 175 10*3/uL (ref 150–400)
RBC: 4.23 MIL/uL (ref 3.87–5.11)
RDW: 14.6 % (ref 11.5–15.5)
WBC: 6.8 10*3/uL (ref 4.0–10.5)
nRBC: 0 % (ref 0.0–0.2)

## 2020-04-14 LAB — COMPREHENSIVE METABOLIC PANEL
ALT: 14 U/L (ref 0–44)
AST: 15 U/L (ref 15–41)
Albumin: 3.6 g/dL (ref 3.5–5.0)
Alkaline Phosphatase: 70 U/L (ref 38–126)
Anion gap: 7 (ref 5–15)
BUN: 35 mg/dL — ABNORMAL HIGH (ref 8–23)
CO2: 27 mmol/L (ref 22–32)
Calcium: 9.1 mg/dL (ref 8.9–10.3)
Chloride: 106 mmol/L (ref 98–111)
Creatinine, Ser: 1.03 mg/dL — ABNORMAL HIGH (ref 0.44–1.00)
GFR calc Af Amer: 57 mL/min — ABNORMAL LOW (ref >60)
GFR calc non Af Amer: 50 mL/min — ABNORMAL LOW (ref >60)
Glucose, Bld: 117 mg/dL — ABNORMAL HIGH (ref 70–99)
Potassium: 5.3 mmol/L — ABNORMAL HIGH (ref 3.5–5.1)
Sodium: 140 mmol/L (ref 135–145)
Total Bilirubin: 0.6 mg/dL (ref 0.3–1.2)
Total Protein: 7.3 g/dL (ref 6.5–8.1)

## 2020-04-14 LAB — URINALYSIS, COMPLETE (UACMP) WITH MICROSCOPIC
Bilirubin Urine: NEGATIVE
Glucose, UA: NEGATIVE mg/dL
Hgb urine dipstick: NEGATIVE
Ketones, ur: NEGATIVE mg/dL
Nitrite: NEGATIVE
Protein, ur: 100 mg/dL — AB
Specific Gravity, Urine: 1.014 (ref 1.005–1.030)
pH: 5 (ref 5.0–8.0)

## 2020-04-14 LAB — LIPASE, BLOOD: Lipase: 20 U/L (ref 11–51)

## 2020-04-14 LAB — BASIC METABOLIC PANEL
Anion gap: 8 (ref 5–15)
BUN: 33 mg/dL — ABNORMAL HIGH (ref 8–23)
CO2: 26 mmol/L (ref 22–32)
Calcium: 8.5 mg/dL — ABNORMAL LOW (ref 8.9–10.3)
Chloride: 106 mmol/L (ref 98–111)
Creatinine, Ser: 0.97 mg/dL (ref 0.44–1.00)
GFR calc Af Amer: >60 mL/min (ref >60)
GFR calc non Af Amer: 53 mL/min — ABNORMAL LOW (ref >60)
Glucose, Bld: 131 mg/dL — ABNORMAL HIGH (ref 70–99)
Potassium: 4.6 mmol/L (ref 3.5–5.1)
Sodium: 140 mmol/L (ref 135–145)

## 2020-04-14 MED ORDER — SODIUM CHLORIDE 0.9 % IV BOLUS
1000.0000 mL | Freq: Once | INTRAVENOUS | Status: AC
  Administered 2020-04-14: 1000 mL via INTRAVENOUS

## 2020-04-14 MED ORDER — SODIUM CHLORIDE 0.9 % IV SOLN
1.0000 g | Freq: Once | INTRAVENOUS | Status: AC
  Administered 2020-04-14: 1 g via INTRAVENOUS
  Filled 2020-04-14: qty 10

## 2020-04-14 MED ORDER — CEPHALEXIN 500 MG PO CAPS: 500.0000 mg | ORAL_CAPSULE | Freq: Two times a day (BID) | ORAL | 0 refills | Status: AC

## 2020-04-14 MED ORDER — SODIUM CHLORIDE 0.9% FLUSH: 3.0000 mL | Freq: Once | INTRAVENOUS | Status: DC

## 2020-04-14 NOTE — ED Notes (Signed)
Delay with follow up BMP due to IV not having infused due to positional IV.  EDP aware

## 2020-04-14 NOTE — ED Triage Notes (Signed)
Pt presents to ED via [facility] with c/o urinary frequency, pt states "I just go and go and go and I just can't stop it". Per first RN note pt with abdominal pain and vomiting, when asked, pt initially denies, then states "well maybe I was but I don't remember", pt with hx of dementia. Pt calm and cooperative, pleasant in triage.   Pt noted to be 91% on RA in triage, hx of COPD.

## 2020-04-14 NOTE — ED Notes (Signed)
Verbal consent for transport back to facility given by patient's son

## 2020-04-14 NOTE — ED Triage Notes (Signed)
First Nurse Note:  Arrives from [facility] -- per report Staff report patient has abdominal pain and vomiting today. Patient denies complaint, but has history of dementia.  VS wnl.

## 2020-04-14 NOTE — ED Notes (Signed)
Patient given dinner tray. Repositioned in bed and sitting up eating at this time. Patient refusing to keep oxygen in nose. Son called and on phone speaking with patient at this time.

## 2020-04-14 NOTE — ED Notes (Signed)
In and out cath for sl cloudy urine,

## 2020-04-14 NOTE — ED Provider Notes (Signed)
[facility] Emergency Department Provider Note   ____________________________________________   First MD Initiated Contact with Patient 04/14/20 1350     (approximate)  I have reviewed the triage vital signs and the nursing notes.   HISTORY  Chief Complaint Urinary Freuqency, Abdominal Pain, and Emesis    HPI [name] is a 86 y.o. female with past medical history of hypertension, hyperlipidemia, diabetes, GERD, and COPD on 2 L nasal cannula who presents to the ED for abdominal pain.  History is limited due to patient's baseline dementia, she arrives from [facility] due to episode of abdominal pain and vomiting.  Staff from assisted living reported that patient complained of some abdominal pain and had an episode of vomiting just prior to arrival.  Patient currently states that she had some pain in her suprapubic area but this is now resolved.  She denies any nausea or vomiting, is uncooperative with remainder of interview stating "I want to go home".  She is requesting her son to be at the bedside, however he currently resides in [place].  She refuses to be moved from wheelchair to stretcher.        Past Medical History:  Diagnosis Date  . Anxiety   . COPD (chronic obstructive pulmonary disease) (HCC)   . Depression   . Diabetes mellitus without complication (HCC)   . GERD (gastroesophageal reflux disease)   . Hip fracture (HCC)   . Hyperlipemia   . Neuropathy   . Vertigo     Patient Active Problem List   Diagnosis Date Noted  . Pneumonia due to COVID-19 virus 10/16/2019  . Elevated brain natriuretic peptide (BNP) level 10/16/2019  . Lactic acidosis 10/16/2019  . Acute on chronic respiratory failure with hypoxia (HCC) 10/15/2019  . Suspected COVID-19 virus infection 10/15/2019  . HTN (hypertension) 10/15/2019  . Chronic diastolic (congestive) heart failure (HCC) 10/15/2019  . Hyperkalemia 10/15/2019  . CKD (chronic kidney  disease), stage IIIa 10/15/2019  . Hypoxia 05/23/2019  . Acute delirium 08/16/2018  . Dementia with behavioral disturbance (HCC) 08/16/2018  . Encephalopathy acute 08/11/2018  . Sepsis (HCC) 05/21/2017  . Aspiration pneumonia (HCC) 05/21/2017  . GERD (gastroesophageal reflux disease) 05/21/2017  . HLD (hyperlipidemia) 05/21/2017  . Depression 05/21/2017  . Anxiety 05/21/2017  . Elevated troponin 05/21/2017  . Diabetes (HCC) 05/21/2017  . Right humeral fracture 05/21/2017  . Nausea and vomiting 03/31/2015  . Microcytic anemia 03/31/2015    Past Surgical History:  Procedure Laterality Date  . ABDOMINAL HYSTERECTOMY    . APPENDECTOMY    . BACK SURGERY     tumor removal bengin  . CHOLECYSTECTOMY      Prior to Admission medications   Medication Sig Start Date End Date Taking? Authorizing Provider  acetaminophen (TYLENOL) 325 MG tablet Take 650 mg by mouth every 4 (four) hours as needed for fever.    [provider]  acetaminophen (TYLENOL) 500 MG tablet Take 1,000 mg by mouth 3 (three) times daily.     [provider]  alum & mag hydroxide-simeth (MAALOX/MYLANTA) 200-200-20 MG/5ML suspension Take 30 mLs by mouth as needed for indigestion or heartburn.    [provider]  amLODipine (NORVASC) 5 MG tablet Take 5 mg by mouth daily.    [provider]  aspirin EC 81 MG tablet Take 81 mg by mouth daily.    [provider]  bismuth subsalicylate (PEPTO BISMOL) 262 MG/15ML suspension Take 262 mg by mouth every 2 (two)  hours as needed for indigestion (up to 6 doses in 24 hr).     [provider]  calcium carbonate (TUMS - DOSED IN MG ELEMENTAL CALCIUM) 500 MG chewable tablet Chew 2 tablets (400 mg of elemental calcium total) by mouth 3 (three) times daily as needed for indigestion or heartburn. 08/23/18   Alford Highland, MD  cephALEXin (KEFLEX) 500 MG capsule Take 1 capsule (500 mg total) by mouth 2 (two) times daily for 7 days. 04/14/20  04/21/20  Chesley Noon, MD  cetirizine (ZYRTEC) 10 MG tablet Take 10 mg by mouth daily.    [provider]  Cholecalciferol (VITAMIN D) 50 MCG (2000 UT) tablet Take 2,000 Units by mouth daily.    [provider]  dicyclomine (BENTYL) 20 MG tablet Take 20 mg by mouth 4 (four) times daily.    [provider]  divalproex (DEPAKOTE ER) 250 MG 24 hr tablet Take 250 mg by mouth at bedtime.    [provider]  esomeprazole (NEXIUM) 20 MG capsule Take 20 mg by mouth daily.     [provider]  feeding supplement, ENSURE ENLIVE, (ENSURE ENLIVE) LIQD Take 237 mLs by mouth 2 (two) times daily between meals. 10/23/19   Ghimire, Werner Lean, MD  furosemide (LASIX) 20 MG tablet Take 1 tablet (20 mg total) by mouth daily. 05/29/19   Altamese Dilling, MD  hydrocortisone cream 1 % Apply 1 application topically every 6 (six) hours as needed for itching.    [provider]  insulin aspart (NOVOLOG) 100 UNIT/ML injection 0-15 Units, Subcutaneous, 3 times daily with meals, CBG < 70: Implement Hypoglycemia measures CBG 70 - 120: 0 units CBG 121 - 150: 2 units CBG 151 - 200: 3 units CBG 201 - 250: 5 units CBG 251 - 300: 8 units CBG 301 - 350: 11 units CBG 351 - 400: 15 units CBG > 400: call MD 10/23/19   Maretta Bees, MD  ipratropium-albuterol (DUONEB) 0.5-2.5 (3) MG/3ML SOLN Take 3 mLs by nebulization 2 (two) times daily. 08/23/18   Alford Highland, MD  ketotifen (ZADITOR) 0.025 % ophthalmic solution Place 1 drop into both eyes 2 (two) times daily.     [provider]  loperamide (IMODIUM A-D) 2 MG tablet Take 2 mg by mouth as needed for diarrhea or loose stools (up to 4 doses in 12 hrs.).    [provider]  LORazepam (ATIVAN) 0.5 MG tablet Take 0.5 tablets (0.25 mg total) by mouth 2 (two) times daily as needed for anxiety. 10/23/19   Ghimire, Werner Lean, MD  magnesium hydroxide (MILK OF MAGNESIA) 400 MG/5ML suspension Take 30 mLs  by mouth daily as needed for mild constipation.    [provider]  metFORMIN (GLUCOPHAGE-XR) 500 MG 24 hr tablet Take 500 mg by mouth 2 (two) times a day.    [provider]  metoprolol tartrate (LOPRESSOR) 25 MG tablet Take 1 tablet (25 mg total) by mouth 2 (two) times daily. 08/23/18   Alford Highland, MD  Multiple Vitamin (MULTIVITAMIN WITH MINERALS) TABS tablet Take 1 tablet by mouth daily. 08/23/18   Alford Highland, MD  nystatin (NYSTATIN) powder Apply 1 g topically 2 (two) times daily as needed (to affected area(s)).    [provider]  olopatadine (PATANOL) 0.1 % ophthalmic solution Place 1 drop into both eyes daily.    [provider]  ondansetron (ZOFRAN) 4 MG tablet Take 4 mg by mouth every 8 (eight) hours as needed for nausea or  vomiting.    [provider]  Pseudoephedrine-DM-GG (ROBITUSSIN CF PO) Take 10 mLs by mouth every 6 (six) hours as needed (cough).    [provider]  psyllium (METAMUCIL) 58.6 % powder Take 1 packet by mouth 2 (two) times a day.    [provider]  QUEtiapine (SEROQUEL) 25 MG tablet Take 1 tablet (25 mg total) by mouth at bedtime as needed (psychosis, insomnia). Patient taking differently: Take 12.5 mg by mouth daily.  08/23/18   Loletha Grayer, MD  sodium phosphate Pediatric (FLEET) 3.5-9.5 GM/59ML enema Place 1 enema rectally once as needed for severe constipation.    [provider]    Allergies Ampicillin and Sulfa antibiotics  Family History  Problem Relation Age of Onset  . Diabetes Mother   . Diabetes Sister        x2  . Breast cancer Sister   . Heart disease Father   . Heart disease Brother        x3  . Bladder Cancer Neg Hx   . Kidney cancer Neg Hx   . Prostate cancer Neg Hx     Social History Social History   Tobacco Use  . Smoking status: Never Smoker  . Smokeless tobacco: Never Used  Substance Use Topics  . Alcohol use: No    Alcohol/week: 0.0 standard  drinks  . Drug use: No    Review of Systems  Constitutional: No fever/chills Eyes: No visual changes. ENT: No sore throat. Cardiovascular: Denies chest pain. Respiratory: Denies shortness of breath. Gastrointestinal: Positive for abdominal pain.  No nausea, no vomiting.  No diarrhea.  No constipation. Genitourinary: Negative for dysuria. Musculoskeletal: Negative for back pain. Skin: Negative for rash. Neurological: Negative for headaches, focal weakness or numbness.  ____________________________________________   PHYSICAL EXAM:  VITAL SIGNS: ED Triage Vitals  Enc Vitals Group     BP 04/14/20 1220 (!) 143/40     Pulse Rate 04/14/20 1220 (!) 54     Resp 04/14/20 1220 20     Temp 04/14/20 1220 98.4 F (36.9 C)     Temp Source 04/14/20 1220 Oral     SpO2 04/14/20 1220 91 %     Weight 04/14/20 1221 140 lb (63.5 kg)     Height 04/14/20 1221 5' (1.524 m)     Head Circumference --      Peak Flow --      Pain Score 04/14/20 1221 0     Pain Loc --      Pain Edu? --      Excl. in [place]? --     Constitutional: Awake and alert. Eyes: Conjunctivae are normal. Head: Atraumatic. Nose: No congestion/rhinnorhea. Mouth/Throat: Mucous membranes are moist. Neck: Normal ROM Cardiovascular: Normal rate, regular rhythm. Grossly normal heart sounds. Respiratory: Normal respiratory effort.  No retractions. Lungs CTAB. Gastrointestinal: Soft and nontender. No distention. Genitourinary: deferred Musculoskeletal: No lower extremity tenderness nor edema. Neurologic:  Normal speech and language. No gross focal neurologic deficits are appreciated. Skin:  Skin is warm, dry and intact. No rash noted. Psychiatric: Mood and affect are normal. Speech and behavior are normal.  ____________________________________________   LABS (all labs ordered are listed, but only abnormal results are displayed)  Labs Reviewed  COMPREHENSIVE METABOLIC PANEL - Abnormal; Notable for the following components:        Result Value   Potassium 5.3 (*)    Glucose, Bld 117 (*)    BUN 35 (*)    Creatinine, Ser 1.03 (*)  GFR calc non Af Amer 50 (*)    GFR calc Af Amer 57 (*)    All other components within normal limits  CBC - Abnormal; Notable for the following components:   Hemoglobin 11.0 (*)    All other components within normal limits  URINALYSIS, COMPLETE (UACMP) WITH MICROSCOPIC - Abnormal; Notable for the following components:   Color, Urine YELLOW (*)    APPearance HAZY (*)    Protein, ur 100 (*)    Leukocytes,Ua SMALL (*)    Bacteria, UA FEW (*)    All other components within normal limits  BASIC METABOLIC PANEL - Abnormal; Notable for the following components:   Glucose, Bld 131 (*)    BUN 33 (*)    Calcium 8.5 (*)    GFR calc non Af Amer 53 (*)    All other components within normal limits  URINE CULTURE  LIPASE, BLOOD    PROCEDURES  Procedure(s) performed (including Critical Care):  Procedures  ED ECG REPORT I, Chesley Noon, the attending physician, personally viewed and interpreted this ECG.   Date: 04/14/2020  EKG Time: 12:28  Rate: 57  Rhythm: sinus bradycardia, sinus arrhythmia  Axis: Normal  Intervals:right bundle branch block  ST&T Change: None  ____________________________________________   INITIAL IMPRESSION / ASSESSMENT AND PLAN / ED COURSE       86 year old female with past medical history of dementia, hypertension, hyperlipidemia, diabetes, COPD on 2 L nasal cannula, and GERD who presents to the ED after staff at her assisted living facility reported she complained of abdominal pain with an episode of vomiting.  Patient now states that abdominal pain has resolved, points to her suprapubic area when asked where she was hurting previously.  She denies any further nausea and has not vomited here in the ED, no abdominal tenderness noted on exam.  She is clearly confused and uncooperative with evaluation, however appears to be at her reported baseline  mental status.  Lab work thus far is remarkable for mild AKI and mild hyperkalemia with no associated EKG changes.  LFTs and lipase within normal limits, no leukocytosis noted.  We will check UA and hydrate with IV fluids, recheck BMP for improvement in hyperkalemia.  UA concerning for infection, especially given patient's urinary frequency and suprapubic pain.  She was given initial dose of Rocephin and hydrated with IV fluids, repeat BMP shows normalization of potassium as well as improvement in her renal function.  Patient is now pleasant and cooperative, we will prescribe Keflex and she is appropriate for discharge back to nursing facility.      ____________________________________________   FINAL CLINICAL IMPRESSION(S) / ED DIAGNOSES  Final diagnoses:  Lower urinary tract infectious disease  Dementia without behavioral disturbance, unspecified dementia type (HCC)  AKI (acute kidney injury) [facility])     ED Discharge Orders         Ordered    cephALEXin (KEFLEX) 500 MG capsule  2 times daily     04/14/20 1638           Note:  This document was prepared using Dragon voice recognition software and may include unintentional dictation errors.   Chesley Noon, MD 04/14/20 [phone]

## 2020-04-16 LAB — URINE CULTURE: Culture: >=100000 — AB

## 2020-09-04 ENCOUNTER — Emergency Department
Admission: EM | Admit: 2020-09-04 | Discharge: 2020-09-04 | Disposition: A | Discharge status: Skilled Nursing Facility | Payer: Medicare Other | Attending: Emergency Medicine | Admitting: Emergency Medicine

## 2020-09-04 ENCOUNTER — Encounter: Payer: Self-pay | Admitting: Emergency Medicine

## 2020-09-04 DIAGNOSIS — W19XXXA Unspecified fall, initial encounter: Secondary | ICD-10-CM | POA: Diagnosis not present

## 2020-09-04 DIAGNOSIS — Z7982 Long term (current) use of aspirin: Secondary | ICD-10-CM | POA: Insufficient documentation

## 2020-09-04 DIAGNOSIS — I130 Hypertensive heart and chronic kidney disease with heart failure and stage 1 through stage 4 chronic kidney disease, or unspecified chronic kidney disease: Secondary | ICD-10-CM | POA: Diagnosis not present

## 2020-09-04 DIAGNOSIS — T887XXA Unspecified adverse effect of drug or medicament, initial encounter: Secondary | ICD-10-CM | POA: Insufficient documentation

## 2020-09-04 DIAGNOSIS — J449 Chronic obstructive pulmonary disease, unspecified: Secondary | ICD-10-CM | POA: Diagnosis not present

## 2020-09-04 DIAGNOSIS — Z794 Long term (current) use of insulin: Secondary | ICD-10-CM | POA: Insufficient documentation

## 2020-09-04 DIAGNOSIS — T424X5A Adverse effect of benzodiazepines, initial encounter: Secondary | ICD-10-CM | POA: Diagnosis not present

## 2020-09-04 DIAGNOSIS — Z7984 Long term (current) use of oral hypoglycemic drugs: Secondary | ICD-10-CM | POA: Diagnosis not present

## 2020-09-04 DIAGNOSIS — E1122 Type 2 diabetes mellitus with diabetic chronic kidney disease: Secondary | ICD-10-CM | POA: Diagnosis not present

## 2020-09-04 DIAGNOSIS — N1831 Chronic kidney disease, stage 3a: Secondary | ICD-10-CM | POA: Diagnosis not present

## 2020-09-04 DIAGNOSIS — Z79899 Other long term (current) drug therapy: Secondary | ICD-10-CM | POA: Insufficient documentation

## 2020-09-04 DIAGNOSIS — Z8616 Personal history of COVID-19: Secondary | ICD-10-CM | POA: Insufficient documentation

## 2020-09-04 DIAGNOSIS — N3000 Acute cystitis without hematuria: Secondary | ICD-10-CM | POA: Diagnosis not present

## 2020-09-04 DIAGNOSIS — I5032 Chronic diastolic (congestive) heart failure: Secondary | ICD-10-CM | POA: Insufficient documentation

## 2020-09-04 DIAGNOSIS — F0390 Unspecified dementia without behavioral disturbance: Secondary | ICD-10-CM | POA: Insufficient documentation

## 2020-09-04 DIAGNOSIS — T50905A Adverse effect of unspecified drugs, medicaments and biological substances, initial encounter: Secondary | ICD-10-CM

## 2020-09-04 LAB — CBC WITH DIFFERENTIAL/PLATELET
Abs Immature Granulocytes: 0.01 10*3/uL (ref 0.00–0.07)
Basophils Absolute: 0.1 10*3/uL (ref 0.0–0.1)
Basophils Relative: 1 %
Eosinophils Absolute: 0.1 10*3/uL (ref 0.0–0.5)
Eosinophils Relative: 1 %
HCT: 35 % — ABNORMAL LOW (ref 36.0–46.0)
Hemoglobin: 10.7 g/dL — ABNORMAL LOW (ref 12.0–15.0)
Immature Granulocytes: 0 %
Lymphocytes Relative: 17 %
Lymphs Abs: 1.3 10*3/uL (ref 0.7–4.0)
MCH: 25.7 pg — ABNORMAL LOW (ref 26.0–34.0)
MCHC: 30.6 g/dL (ref 30.0–36.0)
MCV: 84.1 fL (ref 80.0–100.0)
Monocytes Absolute: 0.6 10*3/uL (ref 0.1–1.0)
Monocytes Relative: 8 %
Neutro Abs: 5.5 10*3/uL (ref 1.7–7.7)
Neutrophils Relative %: 73 %
Platelets: 202 10*3/uL (ref 150–400)
RBC: 4.16 MIL/uL (ref 3.87–5.11)
RDW: 15.4 % (ref 11.5–15.5)
WBC: 7.5 10*3/uL (ref 4.0–10.5)
nRBC: 0 % (ref 0.0–0.2)

## 2020-09-04 LAB — URINALYSIS, COMPLETE (UACMP) WITH MICROSCOPIC
Bilirubin Urine: NEGATIVE
Glucose, UA: NEGATIVE mg/dL
Hgb urine dipstick: NEGATIVE
Ketones, ur: NEGATIVE mg/dL
Nitrite: NEGATIVE
Protein, ur: 100 mg/dL — AB
Specific Gravity, Urine: 1.013 (ref 1.005–1.030)
WBC, UA: >50 WBC/hpf — ABNORMAL HIGH (ref 0–5)
pH: 5 (ref 5.0–8.0)

## 2020-09-04 LAB — BASIC METABOLIC PANEL
Anion gap: 7 (ref 5–15)
BUN: 23 mg/dL (ref 8–23)
CO2: 28 mmol/L (ref 22–32)
Calcium: 9.2 mg/dL (ref 8.9–10.3)
Chloride: 106 mmol/L (ref 98–111)
Creatinine, Ser: 0.99 mg/dL (ref 0.44–1.00)
GFR, Estimated: 56 mL/min — ABNORMAL LOW (ref >60)
Glucose, Bld: 129 mg/dL — ABNORMAL HIGH (ref 70–99)
Potassium: 4.9 mmol/L (ref 3.5–5.1)
Sodium: 141 mmol/L (ref 135–145)

## 2020-09-04 MED ORDER — FOSFOMYCIN TROMETHAMINE 3 G PO PACK
3.0000 g | PACK | Freq: Once | ORAL | Status: AC
  Administered 2020-09-04: 3 g via ORAL
  Filled 2020-09-04: qty 3

## 2020-09-04 NOTE — ED Triage Notes (Signed)
Pt comes into the ED via [facility] from [facility] where she had a witnessed fall.  Pt recently started back on her lorazepam and after taking it this morning she started feeling more tired and her head "heavy".  Pt was assisted to the ground by staff.  Denies any LOC, hitting her head, or having any injury to any extremities.  Pt is alert and oriented x4 and in NAD.

## 2020-09-04 NOTE — Discharge Instructions (Addendum)
STOP the PRN Lorazepam. This medication has caused you to be too drowsy.   You are being treated for a UTI. Take the antibiotic as directed. Follow-up with your primary provider or return if needed.

## 2020-09-04 NOTE — ED Provider Notes (Addendum)
[facility] Emergency Department Provider Note ____________________________________________  Time seen: 1121  I have reviewed the triage vital signs and the nursing notes.  HISTORY  Chief Complaint  Fall  History per patient.   HPI [name] is a 86 y.o. female presents to the ED, via EMS from [facility], where she had a near-fall. She reportedly was recently started on lorazepam, and had a dose this morning. She endorsed feeling "heavy" in her head, and tired. Facility staff assisted the patient to the ground, where she sat, until her symptoms improved. There was no report of head injury , LOC, or other injury. Patient is A&O x 4.  Past Medical History:  Diagnosis Date  . Anxiety   . COPD (chronic obstructive pulmonary disease) (HCC)   . Depression   . Diabetes mellitus without complication (HCC)   . GERD (gastroesophageal reflux disease)   . Hip fracture (HCC)   . Hyperlipemia   . Neuropathy   . Vertigo     Patient Active Problem List   Diagnosis Date Noted  . Pneumonia due to COVID-19 virus 10/16/2019  . Elevated brain natriuretic peptide (BNP) level 10/16/2019  . Lactic acidosis 10/16/2019  . Acute on chronic respiratory failure with hypoxia (HCC) 10/15/2019  . Suspected COVID-19 virus infection 10/15/2019  . HTN (hypertension) 10/15/2019  . Chronic diastolic (congestive) heart failure (HCC) 10/15/2019  . Hyperkalemia 10/15/2019  . CKD (chronic kidney disease), stage IIIa 10/15/2019  . Hypoxia 05/23/2019  . Acute delirium 08/16/2018  . Dementia with behavioral disturbance (HCC) 08/16/2018  . Encephalopathy acute 08/11/2018  . Sepsis (HCC) 05/21/2017  . Aspiration pneumonia (HCC) 05/21/2017  . GERD (gastroesophageal reflux disease) 05/21/2017  . HLD (hyperlipidemia) 05/21/2017  . Depression 05/21/2017  . Anxiety 05/21/2017  . Elevated troponin 05/21/2017  . Diabetes (HCC) 05/21/2017  . Right humeral fracture 05/21/2017   . Nausea and vomiting 03/31/2015  . Microcytic anemia 03/31/2015    Past Surgical History:  Procedure Laterality Date  . ABDOMINAL HYSTERECTOMY    . APPENDECTOMY    . BACK SURGERY     tumor removal bengin  . CHOLECYSTECTOMY      Prior to Admission medications   Medication Sig Start Date End Date Taking? Authorizing Provider  acetaminophen (TYLENOL) 325 MG tablet Take 650 mg by mouth every 4 (four) hours as needed for fever.    [provider]  acetaminophen (TYLENOL) 500 MG tablet Take 1,000 mg by mouth 3 (three) times daily.     [provider]  alum & mag hydroxide-simeth (MAALOX/MYLANTA) 200-200-20 MG/5ML suspension Take 30 mLs by mouth as needed for indigestion or heartburn.    [provider]  amLODipine (NORVASC) 5 MG tablet Take 5 mg by mouth daily.    [provider]  aspirin EC 81 MG tablet Take 81 mg by mouth daily.    [provider]  bismuth subsalicylate (PEPTO BISMOL) 262 MG/15ML suspension Take 262 mg by mouth every 2 (two) hours as needed for indigestion (up to 6 doses in 24 hr).     [provider]  calcium carbonate (TUMS - DOSED IN MG ELEMENTAL CALCIUM) 500 MG chewable tablet Chew 2 tablets (400 mg of elemental calcium total) by mouth 3 (three) times daily as needed for indigestion or heartburn. 08/23/18   Alford Highland, MD  cetirizine (ZYRTEC) 10 MG tablet Take 10 mg by mouth daily.    [provider]  Cholecalciferol (VITAMIN D) 50 MCG (2000 UT) tablet  Take 2,000 Units by mouth daily.    [provider]  dicyclomine (BENTYL) 20 MG tablet Take 20 mg by mouth 4 (four) times daily.    [provider]  divalproex (DEPAKOTE ER) 250 MG 24 hr tablet Take 250 mg by mouth at bedtime.    [provider]  esomeprazole (NEXIUM) 20 MG capsule Take 20 mg by mouth daily.     [provider]  feeding supplement, ENSURE ENLIVE, (ENSURE ENLIVE) LIQD Take 237 mLs by mouth 2 (two) times  daily between meals. 10/23/19   Ghimire, Werner Lean, MD  furosemide (LASIX) 20 MG tablet Take 1 tablet (20 mg total) by mouth daily. 05/29/19   Altamese Dilling, MD  hydrocortisone cream 1 % Apply 1 application topically every 6 (six) hours as needed for itching.    [provider]  insulin aspart (NOVOLOG) 100 UNIT/ML injection 0-15 Units, Subcutaneous, 3 times daily with meals, CBG < 70: Implement Hypoglycemia measures CBG 70 - 120: 0 units CBG 121 - 150: 2 units CBG 151 - 200: 3 units CBG 201 - 250: 5 units CBG 251 - 300: 8 units CBG 301 - 350: 11 units CBG 351 - 400: 15 units CBG > 400: call MD 10/23/19   Maretta Bees, MD  ipratropium-albuterol (DUONEB) 0.5-2.5 (3) MG/3ML SOLN Take 3 mLs by nebulization 2 (two) times daily. 08/23/18   Alford Highland, MD  ketotifen (ZADITOR) 0.025 % ophthalmic solution Place 1 drop into both eyes 2 (two) times daily.     [provider]  loperamide (IMODIUM A-D) 2 MG tablet Take 2 mg by mouth as needed for diarrhea or loose stools (up to 4 doses in 12 hrs.).    [provider]  LORazepam (ATIVAN) 0.5 MG tablet Take 0.5 tablets (0.25 mg total) by mouth 2 (two) times daily as needed for anxiety. 10/23/19   Ghimire, Werner Lean, MD  magnesium hydroxide (MILK OF MAGNESIA) 400 MG/5ML suspension Take 30 mLs by mouth daily as needed for mild constipation.    [provider]  metFORMIN (GLUCOPHAGE-XR) 500 MG 24 hr tablet Take 500 mg by mouth 2 (two) times a day.    [provider]  metoprolol tartrate (LOPRESSOR) 25 MG tablet Take 1 tablet (25 mg total) by mouth 2 (two) times daily. 08/23/18   Alford Highland, MD  Multiple Vitamin (MULTIVITAMIN WITH MINERALS) TABS tablet Take 1 tablet by mouth daily. 08/23/18   Alford Highland, MD  nystatin (NYSTATIN) powder Apply 1 g topically 2 (two) times daily as needed (to affected area(s)).    [provider]  olopatadine (PATANOL) 0.1 % ophthalmic solution  Place 1 drop into both eyes daily.    [provider]  ondansetron (ZOFRAN) 4 MG tablet Take 4 mg by mouth every 8 (eight) hours as needed for nausea or vomiting.    [provider]  Pseudoephedrine-DM-GG (ROBITUSSIN CF PO) Take 10 mLs by mouth every 6 (six) hours as needed (cough).    [provider]  psyllium (METAMUCIL) 58.6 % powder Take 1 packet by mouth 2 (two) times a day.    [provider]  QUEtiapine (SEROQUEL) 25 MG tablet Take 1 tablet (25 mg total) by mouth at bedtime as needed (psychosis, insomnia). Patient taking differently: Take 12.5 mg by mouth daily.  08/23/18   Alford Highland, MD  sodium phosphate Pediatric (FLEET) 3.5-9.5 GM/59ML enema Place 1 enema rectally once as needed for severe constipation.    [provider]  Allergies Ampicillin and Sulfa antibiotics  Family History  Problem Relation Age of Onset  . Diabetes Mother   . Diabetes Sister        x2  . Breast cancer Sister   . Heart disease Father   . Heart disease Brother        x3  . Bladder Cancer Neg Hx   . Kidney cancer Neg Hx   . Prostate cancer Neg Hx     Social History Social History   Tobacco Use  . Smoking status: Never Smoker  . Smokeless tobacco: Never Used  Substance Use Topics  . Alcohol use: No    Alcohol/week: 0.0 standard drinks  . Drug use: No    Review of Systems  Constitutional: Negative for fever. Eyes: Negative for visual changes. ENT: Negative for sore throat. Cardiovascular: Negative for chest pain. Respiratory: Negative for shortness of breath. Gastrointestinal: Negative for abdominal pain, vomiting and constipation. Reports chronic diarrhea.  Genitourinary: Negative for dysuria. Musculoskeletal: Negative for back pain. Skin: Negative for rash. Neurological: Negative for headaches, focal weakness or numbness. ____________________________________________  PHYSICAL EXAM:  VITAL SIGNS: ED Triage Vitals [09/04/20 1035]   Enc Vitals Group     BP 133/66     Pulse Rate (!) 58     Resp 17     Temp 97.7 F (36.5 C)     Temp Source Oral     SpO2 93 %     Weight 160 lb (72.6 kg)     Height 5\' 4"  (1.626 m)     Head Circumference      Peak Flow      Pain Score 0     Pain Loc      Pain Edu?      Excl. in GC?     Constitutional: Alert and oriented. Well appearing and in no distress. GCS = 15 Head: Normocephalic and atraumatic. Eyes: Conjunctivae are normal. PERRL. Normal extraocular movements Cardiovascular: Normal rate, regular rhythm. Normal distal pulses. Respiratory: Normal respiratory effort. No wheezes/rales/rhonchi. Gastrointestinal: Soft and nontender. No distention. Musculoskeletal: Nontender with normal range of motion in all extremities.  Neurologic: Normal speech and language. No gross focal neurologic deficits are appreciated. Skin:  Skin is warm, dry and intact. No rash noted. Psychiatric: Mood and affect are normal. Patient exhibits appropriate insight and judgment. ____________________________________________   LABS (pertinent positives/negatives) Labs Reviewed  URINALYSIS, COMPLETE (UACMP) WITH MICROSCOPIC - Abnormal; Notable for the following components:      Result Value   Color, Urine YELLOW (*)    APPearance CLOUDY (*)    Protein, ur 100 (*)    Leukocytes,Ua MODERATE (*)    WBC, UA >50 (*)    Bacteria, UA MANY (*)    All other components within normal limits  BASIC METABOLIC PANEL - Abnormal; Notable for the following components:   Glucose, Bld 129 (*)    GFR, Estimated 56 (*)    All other components within normal limits  CBC WITH DIFFERENTIAL/PLATELET - Abnormal; Notable for the following components:   Hemoglobin 10.7 (*)    HCT 35.0 (*)    MCH 25.7 (*)    All other components within normal limits  URINE CULTURE  ____________________________________________  EKG  NSR 74 bpm PR Interval 200 ms QRS duration 144 ms  Left Axis  deviation RBBB ____________________________________________  PROCEDURES  Fosfomycin 3 g PO  Procedures ____________________________________________  INITIAL IMPRESSION / ASSESSMENT AND PLAN / ED COURSE  DDX: drug effect, sepsis, urosepsis, UTI,  brady-arrythmyia   Geriatric patient with ED evaluation of a near-fall after being dosed her PRN lorazepam. She did not have report of LOC or head injury. She was evaluated with labs and EKG. Her exam, labs and EKG were all reassuring with the exception of some leukocyturia and bacteruria, along with reported urinary urgency. She will be treated with a single dose of fosfomycin with a pending urine culture. She is reporting improvement of her symptoms and has had a stable ED course. She is advised to discontinue her BZD at this time. She will follow-up with her PCP as needed.   ----------------------------------------- 7:16 PM on 09/04/2020 ----------------------------------------- S/w Leda Roys, RN from [facility] for Mrs. Manson Passey. She was calling to verify if patient needed additional RX for UTI. I advised that patient was treated with a single dose in the ED. She gives reports that patient appears alert, active, and back to baseline. She has never had an adverse reaction to her prn lorazepam. Her weakness my have been primarily attributable to her acute cystitis.    Joylene Wescott was evaluated in Emergency Department on 09/04/2020 for the symptoms described in the history of present illness. She was evaluated in the context of the global COVID-19 pandemic, which necessitated consideration that the patient might be at risk for infection with the SARS-CoV-2 virus that causes COVID-19. Institutional protocols and algorithms that pertain to the evaluation of patients at risk for COVID-19 are in a state of rapid change based on information released by regulatory bodies including the CDC and federal and state organizations. These policies and algorithms  were followed during the patient's care in the ED. ____________________________________________  FINAL CLINICAL IMPRESSION(S) / ED DIAGNOSES  Final diagnoses:  Drug side effects, initial encounter  Acute cystitis without hematuria      Boni Maclellan, Charlesetta Ivory, PA-C 09/04/20 [address], Charlesetta Ivory, PA-C 09/04/20 1918    Jene Every, MD 09/05/20 [id number]

## 2020-09-04 NOTE — ED Notes (Signed)
Patients son called, unavailable to pick up patient. [facility] called, unavailable to pick up patients. Ambulance transport requested.

## 2020-09-06 LAB — URINE CULTURE: Special Requests: NORMAL

## 2020-10-08 ENCOUNTER — Emergency Department: Payer: Medicare Other

## 2020-10-08 DIAGNOSIS — Z794 Long term (current) use of insulin: Secondary | ICD-10-CM | POA: Diagnosis not present

## 2020-10-08 DIAGNOSIS — S0003XA Contusion of scalp, initial encounter: Secondary | ICD-10-CM | POA: Diagnosis not present

## 2020-10-08 DIAGNOSIS — Z8616 Personal history of COVID-19: Secondary | ICD-10-CM | POA: Diagnosis not present

## 2020-10-08 DIAGNOSIS — Z7982 Long term (current) use of aspirin: Secondary | ICD-10-CM | POA: Insufficient documentation

## 2020-10-08 DIAGNOSIS — Z7984 Long term (current) use of oral hypoglycemic drugs: Secondary | ICD-10-CM | POA: Diagnosis not present

## 2020-10-08 DIAGNOSIS — J449 Chronic obstructive pulmonary disease, unspecified: Secondary | ICD-10-CM | POA: Insufficient documentation

## 2020-10-08 DIAGNOSIS — W010XXA Fall on same level from slipping, tripping and stumbling without subsequent striking against object, initial encounter: Secondary | ICD-10-CM | POA: Insufficient documentation

## 2020-10-08 DIAGNOSIS — S0990XA Unspecified injury of head, initial encounter: Secondary | ICD-10-CM | POA: Diagnosis present

## 2020-10-08 DIAGNOSIS — E1140 Type 2 diabetes mellitus with diabetic neuropathy, unspecified: Secondary | ICD-10-CM | POA: Insufficient documentation

## 2020-10-08 DIAGNOSIS — F0391 Unspecified dementia with behavioral disturbance: Secondary | ICD-10-CM | POA: Insufficient documentation

## 2020-10-08 DIAGNOSIS — E1122 Type 2 diabetes mellitus with diabetic chronic kidney disease: Secondary | ICD-10-CM | POA: Insufficient documentation

## 2020-10-08 DIAGNOSIS — N1831 Chronic kidney disease, stage 3a: Secondary | ICD-10-CM | POA: Insufficient documentation

## 2020-10-08 DIAGNOSIS — I5032 Chronic diastolic (congestive) heart failure: Secondary | ICD-10-CM | POA: Diagnosis not present

## 2020-10-08 DIAGNOSIS — Y92090 Kitchen in other non-institutional residence as the place of occurrence of the external cause: Secondary | ICD-10-CM | POA: Diagnosis not present

## 2020-10-08 DIAGNOSIS — I130 Hypertensive heart and chronic kidney disease with heart failure and stage 1 through stage 4 chronic kidney disease, or unspecified chronic kidney disease: Secondary | ICD-10-CM | POA: Insufficient documentation

## 2020-10-08 DIAGNOSIS — Z79899 Other long term (current) drug therapy: Secondary | ICD-10-CM | POA: Diagnosis not present

## 2020-10-08 NOTE — ED Triage Notes (Signed)
Pt to ED after falling and hitting head. Pt reports she was walking to the bathroom and she was having trouble getting around objects in the room and she tripped. No LOC and pt reports she is feeling "Okay" at this time.

## 2020-10-09 ENCOUNTER — Emergency Department
Admission: EM | Admit: 2020-10-09 | Discharge: 2020-10-09 | Disposition: A | Discharge status: Home / Self Care | Payer: Medicare Other | Attending: Emergency Medicine | Admitting: Emergency Medicine

## 2020-10-09 DIAGNOSIS — W19XXXA Unspecified fall, initial encounter: Secondary | ICD-10-CM

## 2020-10-09 DIAGNOSIS — S0003XA Contusion of scalp, initial encounter: Secondary | ICD-10-CM

## 2020-10-09 NOTE — ED Provider Notes (Signed)
[facility] Emergency Department Provider Note  ____________________________________________   First MD Initiated Contact with Patient 10/09/20 0530     (approximate)  I have reviewed the triage vital signs and the nursing notes.   HISTORY  Chief Complaint Fall    HPI [name] is a 86 y.o. female with medical history as listed below who presents for evaluation after a fall.  She states that she went into the kitchen and got tripped up and had a mechanical fall and landed on the back of her head.  She did not lose consciousness.  She denies any headache or neck pain.  She also denies recent fever/chills, sore throat, chest pain, shortness of breath, nausea, vomiting, and abdominal pain.  She laughed and told me that she caught herself with her head so she does not have any injuries to her arms or her legs.  She is able to bear weight.  She has had no visual changes or altered mental status.  She said that she has not been having any difficulty with urination and she has been to the bathroom multiple times since coming to the emergency department and has not had any burning or discomfort.         Past Medical History:  Diagnosis Date  . Anxiety   . COPD (chronic obstructive pulmonary disease) (HCC)   . Depression   . Diabetes mellitus without complication (HCC)   . GERD (gastroesophageal reflux disease)   . Hip fracture (HCC)   . Hyperlipemia   . Neuropathy   . Vertigo     Patient Active Problem List   Diagnosis Date Noted  . Pneumonia due to COVID-19 virus 10/16/2019  . Elevated brain natriuretic peptide (BNP) level 10/16/2019  . Lactic acidosis 10/16/2019  . Acute on chronic respiratory failure with hypoxia (HCC) 10/15/2019  . Suspected COVID-19 virus infection 10/15/2019  . HTN (hypertension) 10/15/2019  . Chronic diastolic (congestive) heart failure (HCC) 10/15/2019  . Hyperkalemia 10/15/2019  . CKD (chronic kidney disease), stage IIIa  10/15/2019  . Hypoxia 05/23/2019  . Acute delirium 08/16/2018  . Dementia with behavioral disturbance (HCC) 08/16/2018  . Encephalopathy acute 08/11/2018  . Sepsis (HCC) 05/21/2017  . Aspiration pneumonia (HCC) 05/21/2017  . GERD (gastroesophageal reflux disease) 05/21/2017  . HLD (hyperlipidemia) 05/21/2017  . Depression 05/21/2017  . Anxiety 05/21/2017  . Elevated troponin 05/21/2017  . Diabetes (HCC) 05/21/2017  . Right humeral fracture 05/21/2017  . Nausea and vomiting 03/31/2015  . Microcytic anemia 03/31/2015    Past Surgical History:  Procedure Laterality Date  . ABDOMINAL HYSTERECTOMY    . APPENDECTOMY    . BACK SURGERY     tumor removal bengin  . CHOLECYSTECTOMY      Prior to Admission medications   Medication Sig Start Date End Date Taking? Authorizing Provider  acetaminophen (TYLENOL) 325 MG tablet Take 650 mg by mouth every 4 (four) hours as needed for fever.    [provider]  acetaminophen (TYLENOL) 500 MG tablet Take 1,000 mg by mouth 3 (three) times daily.     [provider]  alum & mag hydroxide-simeth (MAALOX/MYLANTA) 200-200-20 MG/5ML suspension Take 30 mLs by mouth as needed for indigestion or heartburn.    [provider]  amLODipine (NORVASC) 5 MG tablet Take 5 mg by mouth daily.    [provider]  aspirin EC 81 MG tablet Take 81 mg by mouth daily.    [provider]  bismuth subsalicylate (PEPTO  BISMOL) 262 MG/15ML suspension Take 262 mg by mouth every 2 (two) hours as needed for indigestion (up to 6 doses in 24 hr).     [provider]  calcium carbonate (TUMS - DOSED IN MG ELEMENTAL CALCIUM) 500 MG chewable tablet Chew 2 tablets (400 mg of elemental calcium total) by mouth 3 (three) times daily as needed for indigestion or heartburn. 08/23/18   Alford Highland, MD  cetirizine (ZYRTEC) 10 MG tablet Take 10 mg by mouth daily.    [provider]  Cholecalciferol (VITAMIN D) 50 MCG (2000 UT)  tablet Take 2,000 Units by mouth daily.    [provider]  dicyclomine (BENTYL) 20 MG tablet Take 20 mg by mouth 4 (four) times daily.    [provider]  divalproex (DEPAKOTE ER) 250 MG 24 hr tablet Take 250 mg by mouth at bedtime.    [provider]  esomeprazole (NEXIUM) 20 MG capsule Take 20 mg by mouth daily.     [provider]  feeding supplement, ENSURE ENLIVE, (ENSURE ENLIVE) LIQD Take 237 mLs by mouth 2 (two) times daily between meals. 10/23/19   Ghimire, Werner Lean, MD  furosemide (LASIX) 20 MG tablet Take 1 tablet (20 mg total) by mouth daily. 05/29/19   Altamese Dilling, MD  hydrocortisone cream 1 % Apply 1 application topically every 6 (six) hours as needed for itching.    [provider]  insulin aspart (NOVOLOG) 100 UNIT/ML injection 0-15 Units, Subcutaneous, 3 times daily with meals, CBG < 70: Implement Hypoglycemia measures CBG 70 - 120: 0 units CBG 121 - 150: 2 units CBG 151 - 200: 3 units CBG 201 - 250: 5 units CBG 251 - 300: 8 units CBG 301 - 350: 11 units CBG 351 - 400: 15 units CBG > 400: call MD 10/23/19   Maretta Bees, MD  ipratropium-albuterol (DUONEB) 0.5-2.5 (3) MG/3ML SOLN Take 3 mLs by nebulization 2 (two) times daily. 08/23/18   Alford Highland, MD  ketotifen (ZADITOR) 0.025 % ophthalmic solution Place 1 drop into both eyes 2 (two) times daily.     [provider]  loperamide (IMODIUM A-D) 2 MG tablet Take 2 mg by mouth as needed for diarrhea or loose stools (up to 4 doses in 12 hrs.).    [provider]  LORazepam (ATIVAN) 0.5 MG tablet Take 0.5 tablets (0.25 mg total) by mouth 2 (two) times daily as needed for anxiety. 10/23/19   Ghimire, Werner Lean, MD  magnesium hydroxide (MILK OF MAGNESIA) 400 MG/5ML suspension Take 30 mLs by mouth daily as needed for mild constipation.    [provider]  metFORMIN (GLUCOPHAGE-XR) 500 MG 24 hr tablet Take 500 mg by mouth 2 (two) times a day.     [provider]  metoprolol tartrate (LOPRESSOR) 25 MG tablet Take 1 tablet (25 mg total) by mouth 2 (two) times daily. 08/23/18   Alford Highland, MD  Multiple Vitamin (MULTIVITAMIN WITH MINERALS) TABS tablet Take 1 tablet by mouth daily. 08/23/18   Alford Highland, MD  nystatin (NYSTATIN) powder Apply 1 g topically 2 (two) times daily as needed (to affected area(s)).    [provider]  olopatadine (PATANOL) 0.1 % ophthalmic solution Place 1 drop into both eyes daily.    [provider]  ondansetron (ZOFRAN) 4 MG tablet Take 4 mg by mouth every 8 (eight) hours as needed for nausea or vomiting.    [provider]  Pseudoephedrine-DM-GG (ROBITUSSIN CF PO) Take 10 mLs  by mouth every 6 (six) hours as needed (cough).    [provider]  psyllium (METAMUCIL) 58.6 % powder Take 1 packet by mouth 2 (two) times a day.    [provider]  QUEtiapine (SEROQUEL) 25 MG tablet Take 1 tablet (25 mg total) by mouth at bedtime as needed (psychosis, insomnia). Patient taking differently: Take 12.5 mg by mouth daily.  08/23/18   Alford HighlandWieting, Richard, MD  sodium phosphate Pediatric (FLEET) 3.5-9.5 GM/59ML enema Place 1 enema rectally once as needed for severe constipation.    [provider]    Allergies Ampicillin and Sulfa antibiotics  Family History  Problem Relation Age of Onset  . Diabetes Mother   . Diabetes Sister        x2  . Breast cancer Sister   . Heart disease Father   . Heart disease Brother        x3  . Bladder Cancer Neg Hx   . Kidney cancer Neg Hx   . Prostate cancer Neg Hx     Social History Social History   Tobacco Use  . Smoking status: Never Smoker  . Smokeless tobacco: Never Used  Substance Use Topics  . Alcohol use: No    Alcohol/week: 0.0 standard drinks  . Drug use: No    Review of Systems Constitutional: No fever/chills Eyes: No visual changes. ENT: No sore throat. Cardiovascular: Denies chest  pain. Respiratory: Denies shortness of breath. Gastrointestinal: No abdominal pain.  No nausea, no vomiting.  No diarrhea.  No constipation. Genitourinary: Negative for dysuria. Musculoskeletal: Negative for neck pain.  Negative for back pain. Integumentary: Negative for rash. Neurological: Negative for headaches, focal weakness or numbness.   ____________________________________________   PHYSICAL EXAM:  ED Triage Vitals  Enc Vitals Group     BP 10/09/20 0607 (!) 180/65     Pulse Rate 10/08/20 2301 70     Resp 10/08/20 2301 16     Temp 10/08/20 2301 99.3 F (37.4 C)     Temp Source 10/08/20 2301 Oral     SpO2 10/08/20 2301 93 %     Weight --      Height --      Head Circumference --      Peak Flow --      Pain Score 10/08/20 2301 3     Pain Loc --      Pain Edu? --      Excl. in GC? --      Constitutional: Alert and oriented.  Eyes: Conjunctivae are normal.  Head: Patient has some swelling on the back of her head (hematoma) that is mildly tender to palpation but without laceration. Nose: No congestion/rhinnorhea. Mouth/Throat: Patient is wearing a mask. Neck: No stridor.  No meningeal signs.  No tenderness to palpation of the neck/cervical spine.  She has no pain or tenderness with flexion, extension, nor rotation side to side. Cardiovascular: Normal rate, regular rhythm. Good peripheral circulation. Grossly normal heart sounds. Respiratory: Normal respiratory effort.  No retractions. Gastrointestinal: Soft and nontender. No distention.  Musculoskeletal: No lower extremity tenderness nor edema. No gross deformities of extremities.  She has normal active and passive range of motion of the major joints of her upper and lower extremities. Neurologic:  Normal speech and language. No gross focal neurologic deficits are appreciated.  Skin:  Skin is warm, dry and intact. Psychiatric: Mood and affect are normal. Speech and behavior are  normal.  ____________________________________________   LABS (all labs ordered are listed,  but only abnormal results are displayed)  Labs Reviewed - No data to display ____________________________________________  EKG  No indication for emergent EKG. ____________________________________________  RADIOLOGY Marylou Mccoy, personally viewed and evaluated these images (plain radiographs) as part of my medical decision making, as well as reviewing the written report by the radiologist.  ED MD interpretation: No acute abnormalities identified on CT head nor CT cervical spine. Patient has multiple chronic infarctions but without evidence of an acute abnormality of the brain.  C6 has a chronic compression fracture but without evidence of acute injury.  Official radiology report(s): CT Head Wo Contrast  Result Date: 10/09/2020 CLINICAL DATA:  Fall, head injury EXAM: CT HEAD WITHOUT CONTRAST CT CERVICAL SPINE WITHOUT CONTRAST TECHNIQUE: Multidetector CT imaging of the head and cervical spine was performed following the standard protocol without intravenous contrast. Multiplanar CT image reconstructions of the cervical spine were also generated. COMPARISON:  CT head 06/06/2019 FINDINGS: CT HEAD FINDINGS Brain: Normal anatomic configuration. Parenchymal volume loss is commensurate with the patient's age. Moderate periventricular white matter changes are present likely reflecting the sequela of small vessel ischemia. Remote lacunar infarcts are noted within the a left corona radiata, right putamen, and medial right cerebellar hemisphere. Right putaminal infarct is new since prior examination. No abnormal intra or extra-axial mass lesion or fluid collection. No abnormal mass effect or midline shift. No evidence of acute intracranial hemorrhage or infarct. Ventricular size is normal. Cerebellum unremarkable. Vascular: No asymmetric hyperdense vasculature at the skull base. Skull: Intact Sinuses/Orbits:  Paranasal sinuses are clear. Orbits are unremarkable. Other: There is fluid opacification of a few inferior left mastoid air cells, nonspecific. Mastoid air cells and middle ear cavities are otherwise clear. Small occipital scalp hematoma noted. CT CERVICAL SPINE FINDINGS Assessment of the cervical spine is moderately limited by motion artifact Alignment: Normal cervical lordosis.  No listhesis. Skull base and vertebrae: The craniocervical junction is unremarkable. The atlantodental interval is normal. No acute fracture of the cervical spine. Soft tissues and spinal canal: No prevertebral fluid or swelling. No visible canal hematoma. Disc levels: Review of the sagittal reformats demonstrates preservation of cervical lordosis. A remote compression fracture of C6 is identified with approximately 50% loss of height. Remaining vertebral body height has been preserved. There is intervertebral disc space narrowing and endplate remodeling at C6-7 with associated vacuum disc phenomena in keeping with changes of severe degenerative disc disease. Milder degenerative changes are noted at C5-6. Review of the a axial images demonstrates multilevel moderate to severe uncovertebral and facet arthrosis resulting in multilevel moderate to severe neural foraminal narrowing throughout the cervical spine and mild central canal stenosis at C3-4 and C5-6. Upper chest: Unremarkable Other: There is asymmetric enlargement and heterogeneous attenuation of the left thyroid lobe IMPRESSION: No acute intracranial injury. No calvarial fracture. Small occipital scalp hematoma. Multiple remote infarcts with interval development of a right putaminal infarct since prior examination of 06/06/2019. No acute fracture or listhesis of the cervical spine, allowing for moderate limitation by motion artifact. Multilevel severe uncovertebral and facet arthrosis resulting in multilevel moderate to severe neural foraminal narrowing. Remote C6 compression  fracture with approximately 50% loss of height. Asymmetric enlargement of the left thyroid lobe. This could be better assessed with dedicated thyroid sonography if indicated. Electronically Signed   By: Helyn Numbers MD   On: 10/09/2020 00:25   CT Cervical Spine Wo Contrast  Result Date: 10/09/2020 CLINICAL DATA:  Fall, head injury EXAM: CT HEAD WITHOUT CONTRAST CT CERVICAL SPINE  WITHOUT CONTRAST TECHNIQUE: Multidetector CT imaging of the head and cervical spine was performed following the standard protocol without intravenous contrast. Multiplanar CT image reconstructions of the cervical spine were also generated. COMPARISON:  CT head 06/06/2019 FINDINGS: CT HEAD FINDINGS Brain: Normal anatomic configuration. Parenchymal volume loss is commensurate with the patient's age. Moderate periventricular white matter changes are present likely reflecting the sequela of small vessel ischemia. Remote lacunar infarcts are noted within the a left corona radiata, right putamen, and medial right cerebellar hemisphere. Right putaminal infarct is new since prior examination. No abnormal intra or extra-axial mass lesion or fluid collection. No abnormal mass effect or midline shift. No evidence of acute intracranial hemorrhage or infarct. Ventricular size is normal. Cerebellum unremarkable. Vascular: No asymmetric hyperdense vasculature at the skull base. Skull: Intact Sinuses/Orbits: Paranasal sinuses are clear. Orbits are unremarkable. Other: There is fluid opacification of a few inferior left mastoid air cells, nonspecific. Mastoid air cells and middle ear cavities are otherwise clear. Small occipital scalp hematoma noted. CT CERVICAL SPINE FINDINGS Assessment of the cervical spine is moderately limited by motion artifact Alignment: Normal cervical lordosis.  No listhesis. Skull base and vertebrae: The craniocervical junction is unremarkable. The atlantodental interval is normal. No acute fracture of the cervical spine. Soft  tissues and spinal canal: No prevertebral fluid or swelling. No visible canal hematoma. Disc levels: Review of the sagittal reformats demonstrates preservation of cervical lordosis. A remote compression fracture of C6 is identified with approximately 50% loss of height. Remaining vertebral body height has been preserved. There is intervertebral disc space narrowing and endplate remodeling at C6-7 with associated vacuum disc phenomena in keeping with changes of severe degenerative disc disease. Milder degenerative changes are noted at C5-6. Review of the a axial images demonstrates multilevel moderate to severe uncovertebral and facet arthrosis resulting in multilevel moderate to severe neural foraminal narrowing throughout the cervical spine and mild central canal stenosis at C3-4 and C5-6. Upper chest: Unremarkable Other: There is asymmetric enlargement and heterogeneous attenuation of the left thyroid lobe IMPRESSION: No acute intracranial injury. No calvarial fracture. Small occipital scalp hematoma. Multiple remote infarcts with interval development of a right putaminal infarct since prior examination of 06/06/2019. No acute fracture or listhesis of the cervical spine, allowing for moderate limitation by motion artifact. Multilevel severe uncovertebral and facet arthrosis resulting in multilevel moderate to severe neural foraminal narrowing. Remote C6 compression fracture with approximately 50% loss of height. Asymmetric enlargement of the left thyroid lobe. This could be better assessed with dedicated thyroid sonography if indicated. Electronically Signed   By: Helyn Numbers MD   On: 10/09/2020 00:25    ____________________________________________   PROCEDURES   Procedure(s) performed (including Critical Care):  Procedures   ____________________________________________   INITIAL IMPRESSION / MDM / ASSESSMENT AND PLAN / ED COURSE  As part of my medical decision making, I reviewed the  following data within the electronic MEDICAL RECORD NUMBER Nursing notes reviewed and incorporated, Old chart reviewed, Discussed with radiologist and Notes from prior ED visits   Differential diagnosis includes, but is not limited to, mechanical fall, musculoskeletal strain, hematoma, concussion, intracranial bleeding, cervical spine fracture, acute infection.  The patient has been stable in the emergency department waiting room for nearly 8 hours due to overwhelming ED and hospital patient volumes.  The patient says she feels fine and has no complaints and wants to go home.  Her vital signs have been stable and reassuring.  She has no signs of any injury.  She commented to me that her urine looked yellow but she is having no pain when she urinates.  I offered that we could check a urinalysis but she does not want to do so wants to go home.  Given her otherwise reassuring exam and vital signs and lack of symptoms, I think that is reasonable given the strong probability that a woman of her age will have asymptomatic pyuria or bacteriuria and then we will be in a dilemma of whether or not to treat empirically and potentially cause medication side effects.  Of note, her CT scan of the cervical spine shows a remote compression fracture of C6, but she has no tenderness to palpation of the cervical spine and no focal neurological deficits.  Similarly, she has no acute neurological deficits and spite of multiple chronic infarctions including 1 that is new since her last imaging that was about a year and a half ago.  However given no recent changes, reassuring work-up tonight, and no other symptoms, I do not feel that she would benefit from additional work-up at this time.  I called and spoke with the radiologist (Dr. Ramiro Harvest) and he agreed that these abnormalities are not acute and rather the results of chronic changes.  The patient strongly prefers to go home now and I encouraged close outpatient follow-up.  She  mentioned that she may not have a primary care doctor right at the moment even though Dr. Einar Crow is listed as her PCP.  I recommended she call that office but I also provided the name and number for the patient navigator in case she needs to establish care with a new doctor.  I also gave strict return precautions should she need to come back to the emergency department and she understands and agrees.   ____________________________________________  FINAL CLINICAL IMPRESSION(S) / ED DIAGNOSES  Final diagnoses:  Fall, initial encounter  Hematoma of scalp, initial encounter     MEDICATIONS GIVEN DURING THIS VISIT:  Medications - No data to display   ED Discharge Orders    None      *Please note:  Keelin Romell Cavanah was evaluated in Emergency Department on 10/09/2020 for the symptoms described in the history of present illness. She was evaluated in the context of the global COVID-19 pandemic, which necessitated consideration that the patient might be at risk for infection with the SARS-CoV-2 virus that causes COVID-19. Institutional protocols and algorithms that pertain to the evaluation of patients at risk for COVID-19 are in a state of rapid change based on information released by regulatory bodies including the CDC and federal and state organizations. These policies and algorithms were followed during the patient's care in the ED.  Some ED evaluations and interventions may be delayed as a result of limited staffing during and after the pandemic.*  Note:  This document was prepared using Dragon voice recognition software and may include unintentional dictation errors.   Loleta Rose, MD 10/09/20 [phone]

## 2020-10-09 NOTE — Discharge Instructions (Signed)

## 2020-10-09 NOTE — ED Notes (Signed)
Called son who stated he is in [place] and unable to transport. I called [facility] who stated they wanted her sent back by EMS.

## 2020-10-28 ENCOUNTER — Emergency Department: Payer: Medicare Other

## 2020-10-28 ENCOUNTER — Other Ambulatory Visit: Payer: Self-pay

## 2020-10-28 ENCOUNTER — Inpatient Hospital Stay
Admission: EM | Admit: 2020-10-28 | Discharge: 2020-11-03 | DRG: 291 | Disposition: A | Discharge status: Home Health | Payer: Medicare Other | Source: Skilled Nursing Facility | Attending: Internal Medicine | Admitting: Internal Medicine

## 2020-10-28 DIAGNOSIS — Z803 Family history of malignant neoplasm of breast: Secondary | ICD-10-CM

## 2020-10-28 DIAGNOSIS — D631 Anemia in chronic kidney disease: Secondary | ICD-10-CM | POA: Diagnosis present

## 2020-10-28 DIAGNOSIS — I130 Hypertensive heart and chronic kidney disease with heart failure and stage 1 through stage 4 chronic kidney disease, or unspecified chronic kidney disease: Principal | ICD-10-CM | POA: Diagnosis present

## 2020-10-28 DIAGNOSIS — E1140 Type 2 diabetes mellitus with diabetic neuropathy, unspecified: Secondary | ICD-10-CM | POA: Diagnosis present

## 2020-10-28 DIAGNOSIS — I5043 Acute on chronic combined systolic (congestive) and diastolic (congestive) heart failure: Secondary | ICD-10-CM | POA: Diagnosis present

## 2020-10-28 DIAGNOSIS — F0390 Unspecified dementia without behavioral disturbance: Secondary | ICD-10-CM | POA: Diagnosis present

## 2020-10-28 DIAGNOSIS — Z7982 Long term (current) use of aspirin: Secondary | ICD-10-CM

## 2020-10-28 DIAGNOSIS — Z20822 Contact with and (suspected) exposure to covid-19: Secondary | ICD-10-CM | POA: Diagnosis present

## 2020-10-28 DIAGNOSIS — Z880 Allergy status to penicillin: Secondary | ICD-10-CM

## 2020-10-28 DIAGNOSIS — E669 Obesity, unspecified: Secondary | ICD-10-CM | POA: Diagnosis present

## 2020-10-28 DIAGNOSIS — I509 Heart failure, unspecified: Secondary | ICD-10-CM

## 2020-10-28 DIAGNOSIS — I452 Bifascicular block: Secondary | ICD-10-CM | POA: Diagnosis present

## 2020-10-28 DIAGNOSIS — F419 Anxiety disorder, unspecified: Secondary | ICD-10-CM | POA: Diagnosis not present

## 2020-10-28 DIAGNOSIS — I5031 Acute diastolic (congestive) heart failure: Secondary | ICD-10-CM | POA: Diagnosis not present

## 2020-10-28 DIAGNOSIS — I5040 Unspecified combined systolic (congestive) and diastolic (congestive) heart failure: Secondary | ICD-10-CM

## 2020-10-28 DIAGNOSIS — E119 Type 2 diabetes mellitus without complications: Secondary | ICD-10-CM | POA: Diagnosis present

## 2020-10-28 DIAGNOSIS — Z794 Long term (current) use of insulin: Secondary | ICD-10-CM

## 2020-10-28 DIAGNOSIS — Z8249 Family history of ischemic heart disease and other diseases of the circulatory system: Secondary | ICD-10-CM | POA: Diagnosis not present

## 2020-10-28 DIAGNOSIS — E1122 Type 2 diabetes mellitus with diabetic chronic kidney disease: Secondary | ICD-10-CM | POA: Diagnosis present

## 2020-10-28 DIAGNOSIS — Z8616 Personal history of COVID-19: Secondary | ICD-10-CM

## 2020-10-28 DIAGNOSIS — I081 Rheumatic disorders of both mitral and tricuspid valves: Secondary | ICD-10-CM | POA: Diagnosis present

## 2020-10-28 DIAGNOSIS — J9601 Acute respiratory failure with hypoxia: Secondary | ICD-10-CM | POA: Diagnosis present

## 2020-10-28 DIAGNOSIS — Z833 Family history of diabetes mellitus: Secondary | ICD-10-CM

## 2020-10-28 DIAGNOSIS — N1831 Chronic kidney disease, stage 3a: Secondary | ICD-10-CM | POA: Diagnosis present

## 2020-10-28 DIAGNOSIS — E785 Hyperlipidemia, unspecified: Secondary | ICD-10-CM | POA: Diagnosis present

## 2020-10-28 DIAGNOSIS — Z9049 Acquired absence of other specified parts of digestive tract: Secondary | ICD-10-CM

## 2020-10-28 DIAGNOSIS — Z66 Do not resuscitate: Secondary | ICD-10-CM | POA: Diagnosis present

## 2020-10-28 DIAGNOSIS — Z90710 Acquired absence of both cervix and uterus: Secondary | ICD-10-CM

## 2020-10-28 DIAGNOSIS — F32A Depression, unspecified: Secondary | ICD-10-CM | POA: Diagnosis present

## 2020-10-28 DIAGNOSIS — I5033 Acute on chronic diastolic (congestive) heart failure: Secondary | ICD-10-CM | POA: Diagnosis not present

## 2020-10-28 DIAGNOSIS — R42 Dizziness and giddiness: Secondary | ICD-10-CM | POA: Diagnosis present

## 2020-10-28 DIAGNOSIS — Z6832 Body mass index (BMI) 32.0-32.9, adult: Secondary | ICD-10-CM

## 2020-10-28 DIAGNOSIS — K219 Gastro-esophageal reflux disease without esophagitis: Secondary | ICD-10-CM | POA: Diagnosis present

## 2020-10-28 DIAGNOSIS — J449 Chronic obstructive pulmonary disease, unspecified: Secondary | ICD-10-CM | POA: Diagnosis present

## 2020-10-28 DIAGNOSIS — Z79899 Other long term (current) drug therapy: Secondary | ICD-10-CM

## 2020-10-28 DIAGNOSIS — R0902 Hypoxemia: Secondary | ICD-10-CM

## 2020-10-28 DIAGNOSIS — I10 Essential (primary) hypertension: Secondary | ICD-10-CM | POA: Diagnosis not present

## 2020-10-28 DIAGNOSIS — Z882 Allergy status to sulfonamides status: Secondary | ICD-10-CM

## 2020-10-28 LAB — CBC WITH DIFFERENTIAL/PLATELET
Abs Immature Granulocytes: 0.01 10*3/uL (ref 0.00–0.07)
Basophils Absolute: 0 10*3/uL (ref 0.0–0.1)
Basophils Relative: 1 %
Eosinophils Absolute: 0.1 10*3/uL (ref 0.0–0.5)
Eosinophils Relative: 2 %
HCT: 36.4 % (ref 36.0–46.0)
Hemoglobin: 10.6 g/dL — ABNORMAL LOW (ref 12.0–15.0)
Immature Granulocytes: 0 %
Lymphocytes Relative: 32 %
Lymphs Abs: 1.6 10*3/uL (ref 0.7–4.0)
MCH: 24.9 pg — ABNORMAL LOW (ref 26.0–34.0)
MCHC: 29.1 g/dL — ABNORMAL LOW (ref 30.0–36.0)
MCV: 85.6 fL (ref 80.0–100.0)
Monocytes Absolute: 0.4 10*3/uL (ref 0.1–1.0)
Monocytes Relative: 9 %
Neutro Abs: 2.8 10*3/uL (ref 1.7–7.7)
Neutrophils Relative %: 56 %
Platelets: 220 10*3/uL (ref 150–400)
RBC: 4.25 MIL/uL (ref 3.87–5.11)
RDW: 15.4 % (ref 11.5–15.5)
WBC: 5 10*3/uL (ref 4.0–10.5)
nRBC: 0 % (ref 0.0–0.2)

## 2020-10-28 LAB — COMPREHENSIVE METABOLIC PANEL
ALT: 172 U/L — ABNORMAL HIGH (ref 0–44)
AST: 188 U/L — ABNORMAL HIGH (ref 15–41)
Albumin: 3.2 g/dL — ABNORMAL LOW (ref 3.5–5.0)
Alkaline Phosphatase: 138 U/L — ABNORMAL HIGH (ref 38–126)
Anion gap: 6 (ref 5–15)
BUN: 29 mg/dL — ABNORMAL HIGH (ref 8–23)
CO2: 26 mmol/L (ref 22–32)
Calcium: 8.8 mg/dL — ABNORMAL LOW (ref 8.9–10.3)
Chloride: 108 mmol/L (ref 98–111)
Creatinine, Ser: 0.88 mg/dL (ref 0.44–1.00)
GFR, Estimated: >60 mL/min (ref >60)
Glucose, Bld: 141 mg/dL — ABNORMAL HIGH (ref 70–99)
Potassium: 4.6 mmol/L (ref 3.5–5.1)
Sodium: 140 mmol/L (ref 135–145)
Total Bilirubin: 0.5 mg/dL (ref 0.3–1.2)
Total Protein: 7 g/dL (ref 6.5–8.1)

## 2020-10-28 LAB — URINALYSIS, COMPLETE (UACMP) WITH MICROSCOPIC
Bacteria, UA: NONE SEEN
Bilirubin Urine: NEGATIVE
Glucose, UA: NEGATIVE mg/dL
Hgb urine dipstick: NEGATIVE
Ketones, ur: NEGATIVE mg/dL
Nitrite: NEGATIVE
Protein, ur: 100 mg/dL — AB
Specific Gravity, Urine: 1.015 (ref 1.005–1.030)
Squamous Epithelial / HPF: NONE SEEN (ref 0–5)
WBC, UA: NONE SEEN WBC/hpf (ref 0–5)
pH: 5 (ref 5.0–8.0)

## 2020-10-28 LAB — LACTIC ACID, PLASMA
Lactic Acid, Venous: 1.2 mmol/L (ref 0.5–1.9)
Lactic Acid, Venous: 0.9 mmol/L (ref 0.5–1.9)

## 2020-10-28 LAB — RESP PANEL BY RT-PCR (FLU A&B, COVID) ARPGX2
Influenza A by PCR: NEGATIVE
Influenza B by PCR: NEGATIVE
SARS Coronavirus 2 by RT PCR: NEGATIVE

## 2020-10-28 LAB — VALPROIC ACID LEVEL: Valproic Acid Lvl: 10 ug/mL — ABNORMAL LOW (ref 50.0–100.0)

## 2020-10-28 LAB — TROPONIN I (HIGH SENSITIVITY)
Troponin I (High Sensitivity): 12 ng/L (ref <18)
Troponin I (High Sensitivity): 10 ng/L (ref <18)

## 2020-10-28 LAB — BRAIN NATRIURETIC PEPTIDE: B Natriuretic Peptide: 224.1 pg/mL — ABNORMAL HIGH (ref 0.0–100.0)

## 2020-10-28 MED ORDER — DIVALPROEX SODIUM ER 250 MG PO TB24
250.0000 mg | ORAL_TABLET | Freq: Every day | ORAL | Status: DC
  Administered 2020-10-28 – 2020-11-01 (×4): 250 mg via ORAL
  Filled 2020-10-28 (×7): qty 1

## 2020-10-28 MED ORDER — MAGNESIUM HYDROXIDE 400 MG/5ML PO SUSP: 30.0000 mL | Freq: Every day | ORAL | Status: DC | PRN

## 2020-10-28 MED ORDER — FUROSEMIDE 10 MG/ML IJ SOLN
20.0000 mg | Freq: Once | INTRAMUSCULAR | Status: AC
  Administered 2020-10-28: 19:00:00 20 mg via INTRAVENOUS
  Filled 2020-10-28: qty 4

## 2020-10-28 MED ORDER — ALUM & MAG HYDROXIDE-SIMETH 200-200-20 MG/5ML PO SUSP: 30.0000 mL | ORAL | Status: DC | PRN

## 2020-10-28 MED ORDER — ACETAMINOPHEN 650 MG RE SUPP: 650.0000 mg | Freq: Four times a day (QID) | RECTAL | Status: DC | PRN

## 2020-10-28 MED ORDER — ENSURE ENLIVE PO LIQD
237.0000 mL | Freq: Two times a day (BID) | ORAL | Status: DC
  Administered 2020-10-30 – 2020-11-03 (×7): 237 mL via ORAL

## 2020-10-28 MED ORDER — TRAZODONE HCL 50 MG PO TABS
25.0000 mg | ORAL_TABLET | Freq: Every evening | ORAL | Status: DC | PRN
  Administered 2020-11-01: 22:00:00 25 mg via ORAL
  Filled 2020-10-28 (×2): qty 1

## 2020-10-28 MED ORDER — FLEET PEDIATRIC 3.5-9.5 GM/59ML RE ENEM
1.0000 | ENEMA | Freq: Once | RECTAL | Status: DC | PRN
  Filled 2020-10-28: qty 1

## 2020-10-28 MED ORDER — ENOXAPARIN SODIUM 40 MG/0.4ML ~~LOC~~ SOLN
40.0000 mg | SUBCUTANEOUS | Status: DC
  Administered 2020-10-28 – 2020-11-01 (×4): 40 mg via SUBCUTANEOUS
  Filled 2020-10-28 (×6): qty 0.4

## 2020-10-28 MED ORDER — ASPIRIN EC 81 MG PO TBEC: 81.0000 mg | DELAYED_RELEASE_TABLET | Freq: Every day | ORAL | Status: DC

## 2020-10-28 MED ORDER — ASPIRIN EC 81 MG PO TBEC
81.0000 mg | DELAYED_RELEASE_TABLET | Freq: Every day | ORAL | Status: DC
  Administered 2020-10-29 – 2020-11-03 (×6): 81 mg via ORAL
  Filled 2020-10-28 (×6): qty 1

## 2020-10-28 MED ORDER — FUROSEMIDE 10 MG/ML IJ SOLN
40.0000 mg | Freq: Two times a day (BID) | INTRAMUSCULAR | Status: DC
  Administered 2020-10-28: 20:00:00 20 mg via INTRAVENOUS
  Administered 2020-10-29: 08:00:00 40 mg via INTRAVENOUS
  Filled 2020-10-28 (×2): qty 4

## 2020-10-28 MED ORDER — OLOPATADINE HCL 0.1 % OP SOLN
1.0000 [drp] | Freq: Every day | OPHTHALMIC | Status: DC
  Administered 2020-10-29 – 2020-11-03 (×6): 1 [drp] via OPHTHALMIC
  Filled 2020-10-28 (×2): qty 5

## 2020-10-28 MED ORDER — METOPROLOL TARTRATE 25 MG PO TABS
25.0000 mg | ORAL_TABLET | Freq: Two times a day (BID) | ORAL | Status: DC
  Administered 2020-10-29 – 2020-11-03 (×7): 25 mg via ORAL
  Filled 2020-10-28 (×11): qty 1

## 2020-10-28 MED ORDER — DICYCLOMINE HCL 20 MG PO TABS
20.0000 mg | ORAL_TABLET | Freq: Four times a day (QID) | ORAL | Status: DC
  Administered 2020-10-28 – 2020-11-03 (×19): 20 mg via ORAL
  Filled 2020-10-28 (×25): qty 1

## 2020-10-28 MED ORDER — QUETIAPINE FUMARATE 25 MG PO TABS
12.5000 mg | ORAL_TABLET | Freq: Every day | ORAL | Status: DC
  Administered 2020-10-29 – 2020-11-01 (×4): 12.5 mg via ORAL
  Filled 2020-10-28 (×6): qty 1

## 2020-10-28 MED ORDER — LORATADINE 10 MG PO TABS
10.0000 mg | ORAL_TABLET | Freq: Every day | ORAL | Status: DC
  Administered 2020-10-29 – 2020-11-03 (×6): 10 mg via ORAL
  Filled 2020-10-28 (×6): qty 1

## 2020-10-28 MED ORDER — AMLODIPINE BESYLATE 5 MG PO TABS
5.0000 mg | ORAL_TABLET | Freq: Every day | ORAL | Status: DC
  Administered 2020-10-29 – 2020-11-03 (×5): 5 mg via ORAL
  Filled 2020-10-28 (×5): qty 1

## 2020-10-28 MED ORDER — ONDANSETRON HCL 4 MG/2ML IJ SOLN: 4.0000 mg | Freq: Four times a day (QID) | INTRAMUSCULAR | Status: DC | PRN

## 2020-10-28 MED ORDER — KETOTIFEN FUMARATE 0.025 % OP SOLN
1.0000 [drp] | Freq: Two times a day (BID) | OPHTHALMIC | Status: DC | PRN
  Filled 2020-10-28: qty 5

## 2020-10-28 MED ORDER — CALCIUM CARBONATE ANTACID 500 MG PO CHEW: 400.0000 mg | CHEWABLE_TABLET | Freq: Three times a day (TID) | ORAL | Status: DC | PRN

## 2020-10-28 MED ORDER — ADULT MULTIVITAMIN W/MINERALS CH
1.0000 | ORAL_TABLET | Freq: Every day | ORAL | Status: DC
  Administered 2020-10-29 – 2020-11-03 (×6): 1 via ORAL
  Filled 2020-10-28 (×6): qty 1

## 2020-10-28 MED ORDER — ACETAMINOPHEN 325 MG PO TABS
650.0000 mg | ORAL_TABLET | Freq: Four times a day (QID) | ORAL | Status: DC | PRN
  Administered 2020-10-30 – 2020-11-03 (×10): 650 mg via ORAL
  Filled 2020-10-28 (×11): qty 2

## 2020-10-28 MED ORDER — BISMUTH SUBSALICYLATE 262 MG/15ML PO SUSP
15.0000 mL | ORAL | Status: DC | PRN
  Filled 2020-10-28: qty 118

## 2020-10-28 MED ORDER — ONDANSETRON HCL 4 MG PO TABS: 4.0000 mg | ORAL_TABLET | Freq: Four times a day (QID) | ORAL | Status: DC | PRN

## 2020-10-28 MED ORDER — ASPIRIN EC 81 MG PO TBEC
81.0000 mg | DELAYED_RELEASE_TABLET | Freq: Once | ORAL | Status: AC
  Administered 2020-10-28: 17:00:00 81 mg via ORAL
  Filled 2020-10-28: qty 1

## 2020-10-28 MED ORDER — SODIUM CHLORIDE 0.9 % IV BOLUS
250.0000 mL | Freq: Once | INTRAVENOUS | Status: AC
  Administered 2020-10-28: 17:00:00 250 mL via INTRAVENOUS

## 2020-10-28 MED ORDER — VITAMIN D 25 MCG (1000 UNIT) PO TABS
2000.0000 [IU] | ORAL_TABLET | Freq: Every day | ORAL | Status: DC
  Administered 2020-10-29 – 2020-11-03 (×6): 2000 [IU] via ORAL
  Filled 2020-10-28 (×6): qty 2

## 2020-10-28 MED ORDER — ONDANSETRON HCL 4 MG PO TABS: 4.0000 mg | ORAL_TABLET | Freq: Three times a day (TID) | ORAL | Status: DC | PRN

## 2020-10-28 MED ORDER — PANTOPRAZOLE SODIUM 40 MG PO TBEC
40.0000 mg | DELAYED_RELEASE_TABLET | Freq: Every day | ORAL | Status: DC
  Administered 2020-10-29 – 2020-11-03 (×6): 40 mg via ORAL
  Filled 2020-10-28 (×6): qty 1

## 2020-10-28 MED ORDER — LORAZEPAM 0.5 MG PO TABS
0.2500 mg | ORAL_TABLET | Freq: Two times a day (BID) | ORAL | Status: DC | PRN
  Administered 2020-10-31 – 2020-11-02 (×3): 0.25 mg via ORAL
  Filled 2020-10-28 (×3): qty 1

## 2020-10-28 MED ORDER — IPRATROPIUM-ALBUTEROL 0.5-2.5 (3) MG/3ML IN SOLN
3.0000 mL | Freq: Two times a day (BID) | RESPIRATORY_TRACT | Status: DC
  Administered 2020-10-28 – 2020-10-30 (×4): 3 mL via RESPIRATORY_TRACT
  Filled 2020-10-28 (×5): qty 3

## 2020-10-28 NOTE — ED Notes (Signed)
Ambulated pt in room, O2 sats dropped to 87-88% while moving.  Upon settling back in bed, pt sats dropped to 83% RA.  Placed back on 2L [place].  sats now up 98%.  Provider notified.

## 2020-10-28 NOTE — ED Notes (Signed)
Previous RN placed pt on purewick for urine measurement post lasix.

## 2020-10-28 NOTE — ED Provider Notes (Signed)
[facility] Emergency Department Provider Note   ____________________________________________   Event Date/Time   First MD Initiated Contact with Patient 10/28/20 1521     (approximate)  I have reviewed the triage vital signs and the nursing notes.   HISTORY  Chief Complaint Dizziness    HPI [name] is a 86 y.o. female who complains of lightheadedness when she stands, it started today.  She is not running a fever or feeling short of breath or having any other complaints.  She reports she had some blood in her stool several months ago but that stopped several months ago and she has not had any trouble since then.  She has no chest pain or tightness or belly pain or nausea or other complaints.  She is only lightheaded when she stands up.         Past Medical History:  Diagnosis Date  . Anxiety   . COPD (chronic obstructive pulmonary disease) (HCC)   . Depression   . Diabetes mellitus without complication (HCC)   . GERD (gastroesophageal reflux disease)   . Hip fracture (HCC)   . Hyperlipemia   . Neuropathy   . Vertigo     Patient Active Problem List   Diagnosis Date Noted  . Pneumonia due to COVID-19 virus 10/16/2019  . Elevated brain natriuretic peptide (BNP) level 10/16/2019  . Lactic acidosis 10/16/2019  . Acute on chronic respiratory failure with hypoxia (HCC) 10/15/2019  . Suspected COVID-19 virus infection 10/15/2019  . HTN (hypertension) 10/15/2019  . Chronic diastolic (congestive) heart failure (HCC) 10/15/2019  . Hyperkalemia 10/15/2019  . CKD (chronic kidney disease), stage IIIa 10/15/2019  . Hypoxia 05/23/2019  . Acute delirium 08/16/2018  . Dementia with behavioral disturbance (HCC) 08/16/2018  . Encephalopathy acute 08/11/2018  . Sepsis (HCC) 05/21/2017  . Aspiration pneumonia (HCC) 05/21/2017  . GERD (gastroesophageal reflux disease) 05/21/2017  . HLD (hyperlipidemia) 05/21/2017  . Depression 05/21/2017  . Anxiety  05/21/2017  . Elevated troponin 05/21/2017  . Diabetes (HCC) 05/21/2017  . Right humeral fracture 05/21/2017  . Nausea and vomiting 03/31/2015  . Microcytic anemia 03/31/2015    Past Surgical History:  Procedure Laterality Date  . ABDOMINAL HYSTERECTOMY    . APPENDECTOMY    . BACK SURGERY     tumor removal bengin  . CHOLECYSTECTOMY      Prior to Admission medications   Medication Sig Start Date End Date Taking? Authorizing Provider  acetaminophen (TYLENOL) 325 MG tablet Take 650 mg by mouth every 4 (four) hours as needed for fever.    [provider]  acetaminophen (TYLENOL) 500 MG tablet Take 1,000 mg by mouth 3 (three) times daily.     [provider]  alum & mag hydroxide-simeth (MAALOX/MYLANTA) 200-200-20 MG/5ML suspension Take 30 mLs by mouth as needed for indigestion or heartburn.    [provider]  amLODipine (NORVASC) 5 MG tablet Take 5 mg by mouth daily.    [provider]  aspirin EC 81 MG tablet Take 81 mg by mouth daily.    [provider]  bismuth subsalicylate (PEPTO BISMOL) 262 MG/15ML suspension Take 262 mg by mouth every 2 (two) hours as needed for indigestion (up to 6 doses in 24 hr).     [provider]  calcium carbonate (TUMS - DOSED IN MG ELEMENTAL CALCIUM) 500 MG chewable tablet Chew 2 tablets (400 mg of elemental calcium total) by mouth 3 (three) times daily as needed for indigestion or heartburn.  08/23/18   Alford Highland, MD  cetirizine (ZYRTEC) 10 MG tablet Take 10 mg by mouth daily.    [provider]  Cholecalciferol (VITAMIN D) 50 MCG (2000 UT) tablet Take 2,000 Units by mouth daily.    [provider]  dicyclomine (BENTYL) 20 MG tablet Take 20 mg by mouth 4 (four) times daily.    [provider]  divalproex (DEPAKOTE ER) 250 MG 24 hr tablet Take 250 mg by mouth at bedtime.    [provider]  esomeprazole (NEXIUM) 20 MG capsule Take 20 mg by mouth daily.      [provider]  feeding supplement, ENSURE ENLIVE, (ENSURE ENLIVE) LIQD Take 237 mLs by mouth 2 (two) times daily between meals. 10/23/19   Ghimire, Werner Lean, MD  furosemide (LASIX) 20 MG tablet Take 1 tablet (20 mg total) by mouth daily. 05/29/19   Altamese Dilling, MD  hydrocortisone cream 1 % Apply 1 application topically every 6 (six) hours as needed for itching.    [provider]  insulin aspart (NOVOLOG) 100 UNIT/ML injection 0-15 Units, Subcutaneous, 3 times daily with meals, CBG < 70: Implement Hypoglycemia measures CBG 70 - 120: 0 units CBG 121 - 150: 2 units CBG 151 - 200: 3 units CBG 201 - 250: 5 units CBG 251 - 300: 8 units CBG 301 - 350: 11 units CBG 351 - 400: 15 units CBG > 400: call MD 10/23/19   Maretta Bees, MD  ipratropium-albuterol (DUONEB) 0.5-2.5 (3) MG/3ML SOLN Take 3 mLs by nebulization 2 (two) times daily. 08/23/18   Alford Highland, MD  ketotifen (ZADITOR) 0.025 % ophthalmic solution Place 1 drop into both eyes 2 (two) times daily.     [provider]  loperamide (IMODIUM A-D) 2 MG tablet Take 2 mg by mouth as needed for diarrhea or loose stools (up to 4 doses in 12 hrs.).    [provider]  LORazepam (ATIVAN) 0.5 MG tablet Take 0.5 tablets (0.25 mg total) by mouth 2 (two) times daily as needed for anxiety. 10/23/19   Ghimire, Werner Lean, MD  magnesium hydroxide (MILK OF MAGNESIA) 400 MG/5ML suspension Take 30 mLs by mouth daily as needed for mild constipation.    [provider]  metFORMIN (GLUCOPHAGE-XR) 500 MG 24 hr tablet Take 500 mg by mouth 2 (two) times a day.    [provider]  metoprolol tartrate (LOPRESSOR) 25 MG tablet Take 1 tablet (25 mg total) by mouth 2 (two) times daily. 08/23/18   Alford Highland, MD  Multiple Vitamin (MULTIVITAMIN WITH MINERALS) TABS tablet Take 1 tablet by mouth daily. 08/23/18   Alford Highland, MD  nystatin (NYSTATIN) powder Apply 1 g topically 2 (two) times  daily as needed (to affected area(s)).    [provider]  olopatadine (PATANOL) 0.1 % ophthalmic solution Place 1 drop into both eyes daily.    [provider]  ondansetron (ZOFRAN) 4 MG tablet Take 4 mg by mouth every 8 (eight) hours as needed for nausea or vomiting.    [provider]  Pseudoephedrine-DM-GG (ROBITUSSIN CF PO) Take 10 mLs by mouth every 6 (six) hours as needed (cough).    [provider]  psyllium (METAMUCIL) 58.6 % powder Take 1 packet by mouth 2 (two) times a day.    [provider]  QUEtiapine (SEROQUEL) 25 MG tablet Take 1 tablet (25 mg total) by mouth at bedtime as needed (psychosis, insomnia). Patient taking differently: Take 12.5 mg by mouth daily.  08/23/18   Alford Highland, MD  sodium phosphate Pediatric (FLEET) 3.5-9.5 GM/59ML enema Place 1 enema rectally once as needed for severe constipation.    [provider]    Allergies Ampicillin and Sulfa antibiotics  Family History  Problem Relation Age of Onset  . Diabetes Mother   . Diabetes Sister        x2  . Breast cancer Sister   . Heart disease Father   . Heart disease Brother        x3  . Bladder Cancer Neg Hx   . Kidney cancer Neg Hx   . Prostate cancer Neg Hx     Social History Social History   Tobacco Use  . Smoking status: Never Smoker  . Smokeless tobacco: Never Used  Substance Use Topics  . Alcohol use: No    Alcohol/week: 0.0 standard drinks  . Drug use: No    Review of Systems  Constitutional: No fever/chills Eyes: No visual changes. ENT: No sore throat. Cardiovascular: Denies chest pain. Respiratory: Denies shortness of breath. Gastrointestinal: No abdominal pain.  No nausea, no vomiting.  No diarrhea.  No constipation. Genitourinary: Negative for dysuria. Musculoskeletal: Negative for back pain. Skin: Negative for rash. Neurological: Negative for headaches, focal weakness    ____________________________________________   PHYSICAL EXAM:  VITAL SIGNS: ED Triage Vitals  Enc Vitals Group     BP 10/28/20 1451 (!) 145/57     Pulse Rate 10/28/20 1451 87     Resp 10/28/20 1451 (!) 24     Temp 10/28/20 1451 98.2 F (36.8 C)     Temp Source 10/28/20 1451 Oral     SpO2 10/28/20 1451 91 %     Weight 10/28/20 1454 168 lb 8 oz (76.4 kg)     Height 10/28/20 1454 5' (1.524 m)     Head Circumference --      Peak Flow --      Pain Score --      Pain Loc --      Pain Edu? --      Excl. in GC? --     Constitutional: Alert and oriented. Well appearing and in no acute distress. Eyes: Conjunctivae are normal.  Head: Atraumatic. Nose: No congestion/rhinnorhea. Mouth/Throat: Mucous membranes are moist.  Oropharynx non-erythematous. Neck: No stridor. Cardiovascular: Normal rate, regular rhythm. Grossly normal heart sounds.  Good peripheral circulation. Respiratory: Normal respiratory effort.  No retractions. Lungs CTAB. Gastrointestinal: Soft and nontender. No distention. No abdominal bruits. No CVA tenderness. Musculoskeletal: No lower extremity tenderness nor edema. Neurologic:  Normal speech and language. No gross focal neurologic deficits are appreciated.  Skin:  Skin is warm, dry and intact. No rash noted.   ____________________________________________   LABS (all labs ordered are listed, but only abnormal results are displayed)  Labs Reviewed  COMPREHENSIVE METABOLIC PANEL - Abnormal; Notable for the following components:      Result Value   Glucose, Bld 141 (*)    BUN 29 (*)    Calcium 8.8 (*)    Albumin 3.2 (*)    AST 188 (*)    ALT 172 (*)    Alkaline Phosphatase 138 (*)    All other components within normal limits  CBC WITH DIFFERENTIAL/PLATELET - Abnormal; Notable for the following components:   Hemoglobin 10.6 (*)    MCH 24.9 (*)    MCHC 29.1 (*)    All other components within normal limits  URINALYSIS, COMPLETE (UACMP) WITH MICROSCOPIC  - Abnormal; Notable for  the following components:   Color, Urine YELLOW (*)    APPearance HAZY (*)    Protein, ur 100 (*)    Leukocytes,Ua SMALL (*)    All other components within normal limits  BLOOD GAS, VENOUS - Abnormal; Notable for the following components:   pH, Ven 7.23 (*)    Acid-base deficit 2.9 (*)    All other components within normal limits  BRAIN NATRIURETIC PEPTIDE - Abnormal; Notable for the following components:   B Natriuretic Peptide 224.1 (*)    All other components within normal limits  RESP PANEL BY RT-PCR (FLU A&B, COVID) [id number]  LACTIC ACID, PLASMA  LACTIC ACID, PLASMA  TROPONIN I (HIGH SENSITIVITY)  TROPONIN I (HIGH SENSITIVITY)   ____________________________________________  EKG  EKG read interpreted by me shows normal sinus rhythm rate of 79 right bundle branch and a computer is also reading left anterior hemiblock.  Computer is reading inferior infarct I do not see that.  There may be a small amount of ST elevation in V6 and V5 but the baseline is irregular enough that is difficult to tell.  I will repeat the EKG. __Repeat EKG read interpreted by me shows normal sinus rhythm rate of 79 left axis left bundle and again computer is reading left anterior hemiblock but computer is not reading acute MI any longer in the EKG looks essentially the same as the first 1 with no significant ST elevation.  __________________________________________  RADIOLOGY Jill Poling, personally viewed and evaluated these images (plain radiographs) as part of my medical decision making, as well as reviewing the written report by the radiologist.  ED MD interpretation: Patient's chest x-ray read by radiology reviewed by me shows CHF  Official radiology report(s): DG Chest Portable 1 View  Result Date: 10/28/2020 CLINICAL DATA:  Dizziness and weakness, initial encounter EXAM: PORTABLE CHEST 1 VIEW COMPARISON:  10/17/2019 FINDINGS: Cardiac shadow is enlarged. Aortic  calcifications are seen. Mild vascular congestion is noted with mild interstitial edema. No focal confluent infiltrate is seen. No bony abnormality is noted. IMPRESSION: CHF. Electronically Signed   By: Alcide Clever M.D.   On: 10/28/2020 16:38    ____________________________________________   PROCEDURES  Procedure(s) performed (including Critical Care):  Procedures   ____________________________________________   INITIAL IMPRESSION / ASSESSMENT AND PLAN / ED COURSE  Patient with elevated BNP who gets lightheaded when she walks and then gets hypoxic down to 87.  Chest x-ray looks like congestive heart failure which is consistent with her lab work.  Her troponin is not elevated.  Her electrolytes are not normal either.  We will get her in the hospital for further diuresis.              ____________________________________________   FINAL CLINICAL IMPRESSION(S) / ED DIAGNOSES  Final diagnoses:  Dizziness  Hypoxia  Combined systolic and diastolic congestive heart failure, unspecified HF chronicity [facility])     ED Discharge Orders    None      *Please note:  [name] was evaluated in Emergency Department on 10/28/2020 for the symptoms described in the history of present illness. She was evaluated in the context of the global COVID-19 pandemic, which necessitated consideration that the patient might be at risk for infection with the SARS-CoV-2 virus that causes COVID-19. Institutional protocols and algorithms that pertain to the evaluation of patients at risk for COVID-19 are in a state of rapid change based on information released by regulatory bodies including the CDC and federal  and state organizations. These policies and algorithms were followed during the patient's care in the ED.  Some ED evaluations and interventions may be delayed as a result of limited staffing during and the pandemic.*   Note:  This document was prepared using Dragon voice recognition software  and may include unintentional dictation errors.    Arnaldo Natal, MD 10/28/20 [phone]

## 2020-10-28 NOTE — ED Triage Notes (Signed)
Pt transported via EMS from [facility]  For dizziness.  Pt states it just started this morning

## 2020-10-28 NOTE — H&P (Signed)
[place]   PATIENT NAME: [name]    MR#:  [id number]  DATE OF BIRTH:  Mar 12, 1932  DATE OF ADMISSION:  10/28/2020  PRIMARY CARE PHYSICIAN: Lauro Regulus, MD   REQUESTING/REFERRING PHYSICIAN: Dorothea Glassman, MD CHIEF COMPLAINT:   Chief Complaint  Patient presents with  . Dizziness    HISTORY OF PRESENT ILLNESS:  [name]  is a 86 y.o. Caucasian female with a known history of COPD, anxiety, depression, type 2 diabetes mellitus, GERD and dyslipidemia who presented to the emergency room with acute onset of dizziness with generalized weakness that started today.  She admits to dyspnea on exertion as well as orthopnea and paroxysmal nocturnal dyspnea.  No chest pain or palpitations.  No current nausea or vomiting or abdominal pain. No dysuria, oliguria or hematuria or flank pain.  Upon presentation to the emergency room, blood pressure was 145/57 with respiratory to 24 approximately 91% on 3 L of O2 by nasal cannula and later 97% on 2 L O2.  Labs reviewed.  Labs revealed low albumin of 3.2.  AST was 188 and ALT 172.  BNP was 224.1 and high-sensitivity troponin I was 12.  Lactic acid was 1.2 and later 0.9.  CBC showed mild anemia.  Valproic acid level was 10.  Influenza antigens and Covid PCR came back negative.  UA showed 100 protein.  Chest x-ray was consistent with acute CHF with mild interstitial pulmonary edema  The patient was given baby aspirin and 20 mg of IV Lasix and 250 mL IV normal saline bolus.  She will be admitted to a progressive unit bed for further evaluation and management. PAST MEDICAL HISTORY:   Past Medical History:  Diagnosis Date  . Anxiety   . COPD (chronic obstructive pulmonary disease) (HCC)   . Depression   . Diabetes mellitus without complication (HCC)   . GERD (gastroesophageal reflux disease)   . Hip fracture (HCC)   . Hyperlipemia   . Neuropathy   . Vertigo     PAST SURGICAL HISTORY:   Past Surgical History:  Procedure  Laterality Date  . ABDOMINAL HYSTERECTOMY    . APPENDECTOMY    . BACK SURGERY     tumor removal bengin  . CHOLECYSTECTOMY      SOCIAL HISTORY:   Social History   Tobacco Use  . Smoking status: Never Smoker  . Smokeless tobacco: Never Used  Substance Use Topics  . Alcohol use: No    Alcohol/week: 0.0 standard drinks    FAMILY HISTORY:   Family History  Problem Relation Age of Onset  . Diabetes Mother   . Diabetes Sister        x2  . Breast cancer Sister   . Heart disease Father   . Heart disease Brother        x3  . Bladder Cancer Neg Hx   . Kidney cancer Neg Hx   . Prostate cancer Neg Hx     DRUG ALLERGIES:   Allergies  Allergen Reactions  . Ampicillin     Zpac  . Sulfa Antibiotics     REVIEW OF SYSTEMS:   ROS As per history of present illness. All pertinent systems were reviewed above. Constitutional, HEENT, cardiovascular, respiratory, GI, GU, musculoskeletal, neuro, psychiatric, endocrine, integumentary and hematologic systems were reviewed and are otherwise negative/unremarkable except for positive findings mentioned above in the HPI.   MEDICATIONS AT HOME:   Prior to Admission medications   Medication Sig Start Date End  Date Taking? Authorizing Provider  acetaminophen (TYLENOL) 325 MG tablet Take 650 mg by mouth every 4 (four) hours as needed for fever.    [provider]  acetaminophen (TYLENOL) 500 MG tablet Take 1,000 mg by mouth 3 (three) times daily.     [provider]  alum & mag hydroxide-simeth (MAALOX/MYLANTA) 200-200-20 MG/5ML suspension Take 30 mLs by mouth as needed for indigestion or heartburn.    [provider]  amLODipine (NORVASC) 5 MG tablet Take 5 mg by mouth daily.    [provider]  aspirin EC 81 MG tablet Take 81 mg by mouth daily.    [provider]  bismuth subsalicylate (PEPTO BISMOL) 262 MG/15ML suspension Take 262 mg by mouth every 2 (two) hours as needed for indigestion (up to 6  doses in 24 hr).     [provider]  calcium carbonate (TUMS - DOSED IN MG ELEMENTAL CALCIUM) 500 MG chewable tablet Chew 2 tablets (400 mg of elemental calcium total) by mouth 3 (three) times daily as needed for indigestion or heartburn. 08/23/18   Alford Highland, MD  cetirizine (ZYRTEC) 10 MG tablet Take 10 mg by mouth daily.    [provider]  Cholecalciferol (VITAMIN D) 50 MCG (2000 UT) tablet Take 2,000 Units by mouth daily.    [provider]  dicyclomine (BENTYL) 20 MG tablet Take 20 mg by mouth 4 (four) times daily.    [provider]  divalproex (DEPAKOTE ER) 250 MG 24 hr tablet Take 250 mg by mouth at bedtime.    [provider]  esomeprazole (NEXIUM) 20 MG capsule Take 20 mg by mouth daily.     [provider]  feeding supplement, ENSURE ENLIVE, (ENSURE ENLIVE) LIQD Take 237 mLs by mouth 2 (two) times daily between meals. 10/23/19   Ghimire, Werner Lean, MD  furosemide (LASIX) 20 MG tablet Take 1 tablet (20 mg total) by mouth daily. 05/29/19   Altamese Dilling, MD  hydrocortisone cream 1 % Apply 1 application topically every 6 (six) hours as needed for itching.    [provider]  insulin aspart (NOVOLOG) 100 UNIT/ML injection 0-15 Units, Subcutaneous, 3 times daily with meals, CBG < 70: Implement Hypoglycemia measures CBG 70 - 120: 0 units CBG 121 - 150: 2 units CBG 151 - 200: 3 units CBG 201 - 250: 5 units CBG 251 - 300: 8 units CBG 301 - 350: 11 units CBG 351 - 400: 15 units CBG > 400: call MD 10/23/19   Maretta Bees, MD  ipratropium-albuterol (DUONEB) 0.5-2.5 (3) MG/3ML SOLN Take 3 mLs by nebulization 2 (two) times daily. 08/23/18   Alford Highland, MD  ketotifen (ZADITOR) 0.025 % ophthalmic solution Place 1 drop into both eyes 2 (two) times daily.     [provider]  loperamide (IMODIUM A-D) 2 MG tablet Take 2 mg by mouth as needed for diarrhea or loose stools (up to 4 doses in 12 hrs.).     [provider]  LORazepam (ATIVAN) 0.5 MG tablet Take 0.5 tablets (0.25 mg total) by mouth 2 (two) times daily as needed for anxiety. 10/23/19   Ghimire, Werner Lean, MD  magnesium hydroxide (MILK OF MAGNESIA) 400 MG/5ML suspension Take 30 mLs by mouth daily as needed for mild constipation.    [provider]  metFORMIN (GLUCOPHAGE-XR) 500 MG 24 hr tablet Take 500 mg by mouth 2 (two) times a day.    [provider]  metoprolol tartrate (LOPRESSOR) 25 MG  tablet Take 1 tablet (25 mg total) by mouth 2 (two) times daily. 08/23/18   Alford HighlandWieting, Richard, MD  Multiple Vitamin (MULTIVITAMIN WITH MINERALS) TABS tablet Take 1 tablet by mouth daily. 08/23/18   Alford HighlandWieting, Richard, MD  nystatin (NYSTATIN) powder Apply 1 g topically 2 (two) times daily as needed (to affected area(s)).    [provider]  olopatadine (PATANOL) 0.1 % ophthalmic solution Place 1 drop into both eyes daily.    [provider]  ondansetron (ZOFRAN) 4 MG tablet Take 4 mg by mouth every 8 (eight) hours as needed for nausea or vomiting.    [provider]  Pseudoephedrine-DM-GG (ROBITUSSIN CF PO) Take 10 mLs by mouth every 6 (six) hours as needed (cough).    [provider]  psyllium (METAMUCIL) 58.6 % powder Take 1 packet by mouth 2 (two) times a day.    [provider]  QUEtiapine (SEROQUEL) 25 MG tablet Take 1 tablet (25 mg total) by mouth at bedtime as needed (psychosis, insomnia). Patient taking differently: Take 12.5 mg by mouth daily.  08/23/18   Alford HighlandWieting, Richard, MD  sodium phosphate Pediatric (FLEET) 3.5-9.5 GM/59ML enema Place 1 enema rectally once as needed for severe constipation.    [provider]      VITAL SIGNS:  Blood pressure (!) 136/45, pulse 78, temperature 98.2 F (36.8 C), temperature source Oral, resp. rate (!) 21, height 5' (1.524 m), weight 76.4 kg, SpO2 98 %.  PHYSICAL EXAMINATION:  Physical Exam  GENERAL:  86 y.o.-year-old  Caucasian female patient lying in the bed with mild respiratory distress with conversational dyspnea.   EYES: Pupils equal, round, reactive to light and accommodation. No scleral icterus. Extraocular muscles intact.  HEENT: Head atraumatic, normocephalic. Oropharynx and nasopharynx clear.  NECK:  Supple, no jugular venous distention. No thyroid enlargement, no tenderness.  LUNGS: Diminished bibasal breath sounds with bibasal rales. CARDIOVASCULAR: Regular rate and rhythm, S1, S2 normal. No murmurs, rubs, or gallops.  ABDOMEN: Soft, nondistended, nontender. Bowel sounds present. No organomegaly or mass.  EXTREMITIES: 1+ bilateral lower extremity pitting edema with no cyanosis, or clubbing.  NEUROLOGIC: Cranial nerves II through XII are intact. Muscle strength 5/5 in all extremities. Sensation intact. Gait not checked.  PSYCHIATRIC: The patient is alert and oriented x 3.  Normal affect and good eye contact. SKIN: No obvious rash, lesion, or ulcer.   LABORATORY PANEL:   CBC Recent Labs  Lab 10/28/20 1512  WBC 5.0  HGB 10.6*  HCT 36.4  PLT 220   ------------------------------------------------------------------------------------------------------------------  Chemistries  Recent Labs  Lab 10/28/20 1512  NA 140  K 4.6  CL 108  CO2 26  GLUCOSE 141*  BUN 29*  CREATININE 0.88  CALCIUM 8.8*  AST 188*  ALT 172*  ALKPHOS 138*  BILITOT 0.5   ------------------------------------------------------------------------------------------------------------------  Cardiac Enzymes No results for input(s): TROPONINI in the last 168 hours. ------------------------------------------------------------------------------------------------------------------  RADIOLOGY:  DG Chest Portable 1 View  Result Date: 10/28/2020 CLINICAL DATA:  Dizziness and weakness, initial encounter EXAM: PORTABLE CHEST 1 VIEW COMPARISON:  10/17/2019 FINDINGS: Cardiac shadow is enlarged. Aortic calcifications are  seen. Mild vascular congestion is noted with mild interstitial edema. No focal confluent infiltrate is seen. No bony abnormality is noted. IMPRESSION: CHF. Electronically Signed   By: Alcide CleverMark  Lukens M.D.   On: 10/28/2020 16:38      IMPRESSION AND PLAN:   1.  Acute on chronic diastolic CHF. -The patient will be admitted to a progressive unit bed. -He will be  diuresed with IV Lasix. -We will obtain a 2D echo and cardiology consult. -I notified Dr. Darrold Junker about the patient.  2.  Essential hypertension. -We will continue his antihypertensives including Norvasc and Lopressor..  3.  Type 2 diabetes mellitus. -We will place the patient on supplemental coverage with NovoLog and hold metformin.  4.  Anxiety and depression. -We will continue Zoloft, Depakote ER and Seroquel.  5.  DVT prophylaxis. -Subcutaneous Lovenox   All the records are reviewed and case discussed with ED provider. The plan of care was discussed in details with the patient (and family). I answered all questions. The patient agreed to proceed with the above mentioned plan. Further management will depend upon hospital course.   CODE STATUS: The patient is DNR/DNI.  Status is: Inpatient  Remains inpatient appropriate because:Ongoing diagnostic testing needed not appropriate for outpatient work up, Unsafe d/c plan, IV treatments appropriate due to intensity of illness or inability to take PO and Inpatient level of care appropriate due to severity of illness   Dispo: The patient is from: Home              Anticipated d/c is to: Home              Anticipated d/c date is: 2 days              Patient currently is not medically stable to d/c.  TOTAL TIME TAKING CARE OF THIS PATIENT: 55 minutes.    Hannah Beat M.D on 10/28/2020 at 7:29 PM  [facility]   From 7 PM-7 AM, contact night-coverage [email or web address]  CC: Primary care physician; Lauro Regulus, MD

## 2020-10-29 ENCOUNTER — Inpatient Hospital Stay
Admit: 2020-10-29 | Discharge: 2020-10-29 | Disposition: A | Discharge status: Home / Self Care | Payer: Medicare Other | Attending: Family Medicine | Admitting: Family Medicine

## 2020-10-29 DIAGNOSIS — I509 Heart failure, unspecified: Secondary | ICD-10-CM

## 2020-10-29 DIAGNOSIS — I5031 Acute diastolic (congestive) heart failure: Secondary | ICD-10-CM

## 2020-10-29 LAB — CBC
HCT: 37.3 % (ref 36.0–46.0)
Hemoglobin: 10.7 g/dL — ABNORMAL LOW (ref 12.0–15.0)
MCH: 25.1 pg — ABNORMAL LOW (ref 26.0–34.0)
MCHC: 28.7 g/dL — ABNORMAL LOW (ref 30.0–36.0)
MCV: 87.6 fL (ref 80.0–100.0)
Platelets: 180 10*3/uL (ref 150–400)
RBC: 4.26 MIL/uL (ref 3.87–5.11)
RDW: 15.5 % (ref 11.5–15.5)
WBC: 4.9 10*3/uL (ref 4.0–10.5)
nRBC: 0 % (ref 0.0–0.2)

## 2020-10-29 LAB — ECHOCARDIOGRAM COMPLETE
AR max vel: 1.91 cm2
AV Area VTI: 1.8 cm2
AV Area mean vel: 1.81 cm2
AV Mean grad: 4 mmHg
AV Peak grad: 8 mmHg
Ao pk vel: 1.41 m/s
Area-P 1/2: 2.12 cm2
Height: 60 in
S' Lateral: 2.2 cm
Weight: 2696 oz

## 2020-10-29 LAB — BASIC METABOLIC PANEL
Anion gap: 8 (ref 5–15)
BUN: 27 mg/dL — ABNORMAL HIGH (ref 8–23)
CO2: 26 mmol/L (ref 22–32)
Calcium: 8.6 mg/dL — ABNORMAL LOW (ref 8.9–10.3)
Chloride: 110 mmol/L (ref 98–111)
Creatinine, Ser: 0.82 mg/dL (ref 0.44–1.00)
GFR, Estimated: >60 mL/min (ref >60)
Glucose, Bld: 120 mg/dL — ABNORMAL HIGH (ref 70–99)
Potassium: 4.6 mmol/L (ref 3.5–5.1)
Sodium: 144 mmol/L (ref 135–145)

## 2020-10-29 LAB — CBG MONITORING, ED
Glucose-Capillary: 132 mg/dL — ABNORMAL HIGH (ref 70–99)
Glucose-Capillary: 118 mg/dL — ABNORMAL HIGH (ref 70–99)

## 2020-10-29 LAB — HEMOGLOBIN A1C
Hgb A1c MFr Bld: 6.4 % — ABNORMAL HIGH (ref 4.8–5.6)
Mean Plasma Glucose: 136.98 mg/dL

## 2020-10-29 MED ORDER — FUROSEMIDE 40 MG PO TABS
40.0000 mg | ORAL_TABLET | Freq: Once | ORAL | Status: AC
  Administered 2020-10-29: 23:00:00 40 mg via ORAL

## 2020-10-29 MED ORDER — PERFLUTREN LIPID MICROSPHERE
1.0000 mL | INTRAVENOUS | Status: AC | PRN
  Administered 2020-10-29: 12:00:00 2 mL via INTRAVENOUS
  Filled 2020-10-29: qty 10

## 2020-10-29 MED ORDER — INSULIN ASPART 100 UNIT/ML ~~LOC~~ SOLN: 0.0000 [IU] | Freq: Every day | SUBCUTANEOUS | Status: DC

## 2020-10-29 MED ORDER — INSULIN ASPART 100 UNIT/ML ~~LOC~~ SOLN
0.0000 [IU] | Freq: Three times a day (TID) | SUBCUTANEOUS | Status: DC
  Administered 2020-10-29: 17:00:00 2 [IU] via SUBCUTANEOUS
  Administered 2020-10-30: 12:00:00 3 [IU] via SUBCUTANEOUS
  Administered 2020-10-30 – 2020-10-31 (×4): 2 [IU] via SUBCUTANEOUS
  Administered 2020-10-31 – 2020-11-01 (×2): 3 [IU] via SUBCUTANEOUS
  Administered 2020-11-01 – 2020-11-02 (×2): 2 [IU] via SUBCUTANEOUS
  Administered 2020-11-02 – 2020-11-03 (×2): 3 [IU] via SUBCUTANEOUS
  Filled 2020-10-29 (×12): qty 1

## 2020-10-29 NOTE — ED Notes (Signed)
Transport called to move pt to room upstairs.

## 2020-10-29 NOTE — ED Notes (Signed)
Pt calling unknown people on phone.  Pt pulled out her iv and is hitting at this nurse.  Pt refusing fingerstick blood sugar.

## 2020-10-29 NOTE — ED Notes (Signed)
Pt given meal tray.

## 2020-10-29 NOTE — ED Notes (Signed)
Pt ate 30 percent of meal. Pt given water and cola at this time.

## 2020-10-29 NOTE — ED Notes (Signed)
[facility] NP notified of loss of IV access on pt. Lasix changed from IV to PO.

## 2020-10-29 NOTE — ED Notes (Signed)
This RN explained to pt need for IV access. Pt refusing IV access at this time.

## 2020-10-29 NOTE — ED Notes (Signed)
Fingerstick obtained by nurse tech.  Pt calm now.  Resting quietly.

## 2020-10-29 NOTE — Evaluation (Signed)
Occupational Therapy Evaluation Patient Details Name: [name] MRN: [id number] DOB: Jun 16, 1932 Today's Date: 10/29/2020    History of Present Illness 86 y.o. Caucasian female with a known history of COPD, anxiety, depression, type 2 diabetes mellitus, GERD and dyslipidemia who presented to the emergency room with acute onset of dizziness with generalized weakness that started today. Pt started on Lasix and adm for acute decompensated heart failure on CHF.   Clinical Impression   Pt seen for OT Evaluation this date in setting of acute hospitalization with decompensated HF. Pt reports needing "just a little" assist to walk with walker in ALF setting and reports she was working with PT At one point. Pt states she needed some assist for bathing from an aide as well, but was otherwise able to perform self care including toileting. OT facilitates ed re: role of OT. Pt with moderate carryover. OT engages pt in UB bathing/dressing/grooming with SETUP and MOD A for sonning non-skid socks in sitting. Pt requires MIN A to CTS With RW from stretcher and MIN A for fxl mobility to commode as well as MIN/MOD A for SPS to/from commode in room in ED. Pt tolerates well and has BM during session. RN notified. Requires MOD A for standing posterior peri care. Returned to stretcher with rails up and call light in reach. Anticipate pt will require SNF upon f/u as she's needing more assist to safely mobilize and perform basic self care such as toileting. However, if sufficient assist is available in ALF setting, then she can defer to follow up OT services in that setting.     Follow Up Recommendations  SNF (potential for f/u with rehab team in ALF if available and able to resume support with bathing/dressing from an aide.)    Equipment Recommendations  None recommended by OT (pt reports having necessary equipment)    Recommendations for Other Services       Precautions / Restrictions Precautions Precautions:  Fall Restrictions Weight Bearing Restrictions: No      Mobility Bed Mobility Overal bed mobility: Modified Independent (Simultaneous filing. User may not have seen previous data.) Bed Mobility: Supine to Sit;Sit to Supine     Supine to sit: Min assist Sit to supine: Min assist   General bed mobility comments: increased time, close supervision due to being on high stretcher in ED    Transfers Overall transfer level: Needs assistance (Simultaneous filing. User may not have seen previous data.) Equipment used: Rolling walker (2 wheeled) Transfers: Sit to/from Stand Sit to Stand: Min assist Stand pivot transfers: Min assist;Mod assist       General transfer comment: Patient requires min guard/assist to get back up onto stretcher and assist to block feet from sliding.    Balance Overall balance assessment: Needs assistance Sitting-balance support: Feet unsupported Sitting balance-Leahy Scale: Good Sitting balance - Comments: patient unable to reach floor on high stretcher in sitting     Standing balance-Leahy Scale: Poor Standing balance comment: requires B UE support and MIN A                           ADL either performed or assessed with clinical judgement   ADL Overall ADL's : Needs assistance/impaired                                       General ADL Comments: SETUP  for seated (dressing, bathing, and applying deodorant) UB ADLs, MOD A for seated (donning socks) and standing LB ADLs (peri care).     Vision Baseline Vision/History: Wears glasses Patient Visual Report: No change from baseline       Perception     Praxis      Pertinent Vitals/Pain Pain Assessment: Faces Faces Pain Scale: Hurts a little bit Pain Location: back Pain Descriptors / Indicators: Aching;Discomfort Pain Intervention(s): Monitored during session;Repositioned     Hand Dominance     Extremity/Trunk Assessment Upper Extremity Assessment Upper Extremity  Assessment: Defer to OT evaluation   Lower Extremity Assessment Lower Extremity Assessment: Generalized weakness   Cervical / Trunk Assessment Cervical / Trunk Assessment: Normal   Communication Communication Communication: No difficulties   Cognition Arousal/Alertness: Awake/alert Behavior During Therapy: WFL for tasks assessed/performed Overall Cognitive Status: Within Functional Limits for tasks assessed                                 General Comments: unsure of patient baseline, she is oriented to place, self and some aspects of situation, but not time. Pt PLOF that she reports seems to check out with chart review.   General Comments       Exercises Other Exercises Other Exercises: OT facilitates ed re: role of OT. Pt with moderate carryover. OT engages pt in UB bathing/dressing/grooming with SETUP and MOD A for sonning non-skid socks in sitting. Pt requires MIN A to CTS With RW from stretcher and MIN A for fxl mobility to commode as well as MIN/MOD A for SPS to/from commode in room in ED. Pt tolerates well and has BM during session. RN notified. Requires MOD A for standing posterior peri care. Returned to stretcher with rails up and call light in reach.   Shoulder Instructions      Home Living Family/patient expects to be discharged to:: Assisted living                             Home Equipment: Dan Humphreys - 2 wheels;Shower seat - built in          Prior Functioning/Environment Level of Independence: Needs assistance  Gait / Transfers Assistance Needed: Pt reports she is able to walk with a walker and assistance and has seen PT in the ALF setting ADL's / Homemaking Assistance Needed: Pt reports that facility staff performs all IADLs, but that she is able to do basic self care. States she does have an aide help her with bathing and LB dressing            OT Problem List: Decreased strength;Decreased activity tolerance;Decreased safety  awareness;Cardiopulmonary status limiting activity      OT Treatment/Interventions: Self-care/ADL training;DME and/or AE instruction;Therapeutic activities;Balance training;Therapeutic exercise;Patient/family education    OT Goals(Current goals can be found in the care plan section) Acute Rehab OT Goals Patient Stated Goal: to return home OT Goal Formulation: With patient Time For Goal Achievement: 11/12/20 Potential to Achieve Goals: Good ADL Goals Pt Will Perform Lower Body Dressing: with min assist;sit to/from stand (with LRAD to stand and AE PRN) Pt Will Transfer to Toilet: with supervision;ambulating;bedside commode (with LRAD to [facility] ~10' away) Pt/caregiver will Perform Home Exercise Program: Increased strength;Both right and left upper extremity;With Supervision  OT Frequency: Min 1X/week   Barriers to D/C:  Co-evaluation              AM-PAC OT "6 Clicks" Daily Activity     Outcome Measure Help from another person eating meals?: None Help from another person taking care of personal grooming?: A Little Help from another person toileting, which includes using toliet, bedpan, or urinal?: A Lot Help from another person bathing (including washing, rinsing, drying)?: A Lot Help from another person to put on and taking off regular upper body clothing?: A Little Help from another person to put on and taking off regular lower body clothing?: A Lot 6 Click Score: 16   End of Session Equipment Utilized During Treatment: Gait belt;Rolling walker Nurse Communication: Mobility status  Activity Tolerance: Patient tolerated treatment well Patient left: in bed;with call bell/phone within reach  OT Visit Diagnosis: Unsteadiness on feet (R26.81);Muscle weakness (generalized) (M62.81)                Time: 6546-5035 OT Time Calculation (min): 44 min Charges:  OT General Charges $OT Visit: 1 Visit OT Evaluation $OT Eval Moderate Complexity: 1 Mod OT Treatments $Self  Care/Home Management : 23-37 mins $Therapeutic Activity: 8-22 mins  Rejeana Brock, MS, OTR/L ascom [phone] 10/29/20, 1:24 PM

## 2020-10-29 NOTE — Progress Notes (Signed)
*  PRELIMINARY RESULTS* Echocardiogram 2D Echocardiogram has been performed.  [name] 10/29/2020, 12:22 PM

## 2020-10-29 NOTE — Consult Note (Signed)
CARDIOLOGY CONSULT NOTE               Patient ID: [name] MRN: [id number] DOB/AGE: Oct 16, 1932 86 y.o.  Admit date: 10/28/2020 Referring Physician Georgeann Oppenheim Primary Physician [facility] Cardiologist Gwen Pounds Reason for Consultation acute on chronic diastolic CHF  HPI: 86 year old female referred for evaluation of acute on chronic diastolic heart failure. The patient has a history of obesity, COPD, type 2 diabetes, hypertension, hyperlipidemia, and dementia.  The patient presented to [facility] from the nursing facility where she lives for acute onset of dizziness and lightheadedness with standing and ambulating with generalized weakness that started that day. The patient denies increased shortness of breath, orthopnea, PND, peripheral edema, or chest pain. Upon arrival, blood pressure was 145/57, RR 24, SpO2 91% on room air. She was placed on supplemental oxygen via nasal cannula. Chest x-ray reveals cardiomegaly with mild vascular congestion with mild interstitial edema consistent with CHF.  Admission labs notable for BNP 224, BUN 29, creatinine 0.88, AST 188, ALT 172, and high-sensitivity troponin 12 and 10.  ECG showed RBBB and LAFB, which were noted on ECG in 10/2019. She currently denies chest pain or shortness of breath on room air. She is diuresing well with IV Lasix. She denies a history of MI or stroke. The patient has baseline dementia, but provided a seemingly good history.  Review of systems complete and found to be negative unless listed above     Past Medical History:  Diagnosis Date  . Anxiety   . COPD (chronic obstructive pulmonary disease) (HCC)   . Depression   . Diabetes mellitus without complication (HCC)   . GERD (gastroesophageal reflux disease)   . Hip fracture (HCC)   . Hyperlipemia   . Neuropathy   . Vertigo     Past Surgical History:  Procedure Laterality Date  . ABDOMINAL HYSTERECTOMY    . APPENDECTOMY    . BACK SURGERY     tumor removal  bengin  . CHOLECYSTECTOMY      (Not in a hospital admission)  Social History   Socioeconomic History  . Marital status: Widowed    Spouse name: Not on file  . Number of children: Not on file  . Years of education: Not on file  . Highest education level: Not on file  Occupational History  . Occupation: Retired  Tobacco Use  . Smoking status: Never Smoker  . Smokeless tobacco: Never Used  Substance and Sexual Activity  . Alcohol use: No    Alcohol/week: 0.0 standard drinks  . Drug use: No  . Sexual activity: Not on file  Other Topics Concern  . Not on file  Social History Narrative  . Not on file   Social Determinants of Health   Financial Resource Strain: Not on file  Food Insecurity: Not on file  Transportation Needs: Not on file  Physical Activity: Not on file  Stress: Not on file  Social Connections: Not on file  Intimate Partner Violence: Not on file    Family History  Problem Relation Age of Onset  . Diabetes Mother   . Diabetes Sister        x2  . Breast cancer Sister   . Heart disease Father   . Heart disease Brother        x3  . Bladder Cancer Neg Hx   . Kidney cancer Neg Hx   . Prostate cancer Neg Hx       Review of systems complete and found  to be negative unless listed above      PHYSICAL EXAM  General: Elderly female lying in bed in no acute distress HEENT:  Normocephalic and atramatic Neck:  No JVD.  Lungs: mild bibasilar crackles, normal effort of breathing on supplemental O2. Heart: HRRR .2/6 systolic murmur Abdomen: no obvious distention Msk: no obvious deformities. Able to roll to side in bed without difficulty Extremities: No clubbing, cyanosis or edema.   Neuro: Alert and oriented X 3. Psych:  Good affect, responds appropriately  Labs:   Lab Results  Component Value Date   WBC 4.9 10/29/2020   HGB 10.7 (L) 10/29/2020   HCT 37.3 10/29/2020   MCV 87.6 10/29/2020   PLT 180 10/29/2020    Recent Labs  Lab 10/28/20 1512  10/29/20 0328  NA 140 144  K 4.6 4.6  CL 108 110  CO2 26 26  BUN 29* 27*  CREATININE 0.88 0.82  CALCIUM 8.8* 8.6*  PROT 7.0  --   BILITOT 0.5  --   ALKPHOS 138*  --   ALT 172*  --   AST 188*  --   GLUCOSE 141* 120*   Lab Results  Component Value Date   CKTOTAL 148 05/22/2017   CKMB 2.5 06/17/2014   TROPONINI 0.03 (HH) 08/12/2018   No results found for: CHOL No results found for: HDL No results found for: [facility] Lab Results  Component Value Date   TRIG 97 10/15/2019   No results found for: CHOLHDL No results found for: LDLDIRECT    Radiology: CT Head Wo Contrast  Result Date: 10/09/2020 CLINICAL DATA:  Fall, head injury EXAM: CT HEAD WITHOUT CONTRAST CT CERVICAL SPINE WITHOUT CONTRAST TECHNIQUE: Multidetector CT imaging of the head and cervical spine was performed following the standard protocol without intravenous contrast. Multiplanar CT image reconstructions of the cervical spine were also generated. COMPARISON:  CT head 06/06/2019 FINDINGS: CT HEAD FINDINGS Brain: Normal anatomic configuration. Parenchymal volume loss is commensurate with the patient's age. Moderate periventricular white matter changes are present likely reflecting the sequela of small vessel ischemia. Remote lacunar infarcts are noted within the a left corona radiata, right putamen, and medial right cerebellar hemisphere. Right putaminal infarct is new since prior examination. No abnormal intra or extra-axial mass lesion or fluid collection. No abnormal mass effect or midline shift. No evidence of acute intracranial hemorrhage or infarct. Ventricular size is normal. Cerebellum unremarkable. Vascular: No asymmetric hyperdense vasculature at the skull base. Skull: Intact Sinuses/Orbits: Paranasal sinuses are clear. Orbits are unremarkable. Other: There is fluid opacification of a few inferior left mastoid air cells, nonspecific. Mastoid air cells and middle ear cavities are otherwise clear. Small occipital scalp  hematoma noted. CT CERVICAL SPINE FINDINGS Assessment of the cervical spine is moderately limited by motion artifact Alignment: Normal cervical lordosis.  No listhesis. Skull base and vertebrae: The craniocervical junction is unremarkable. The atlantodental interval is normal. No acute fracture of the cervical spine. Soft tissues and spinal canal: No prevertebral fluid or swelling. No visible canal hematoma. Disc levels: Review of the sagittal reformats demonstrates preservation of cervical lordosis. A remote compression fracture of C6 is identified with approximately 50% loss of height. Remaining vertebral body height has been preserved. There is intervertebral disc space narrowing and endplate remodeling at C6-7 with associated vacuum disc phenomena in keeping with changes of severe degenerative disc disease. Milder degenerative changes are noted at C5-6. Review of the a axial images demonstrates multilevel moderate to severe uncovertebral and facet arthrosis resulting  in multilevel moderate to severe neural foraminal narrowing throughout the cervical spine and mild central canal stenosis at C3-4 and C5-6. Upper chest: Unremarkable Other: There is asymmetric enlargement and heterogeneous attenuation of the left thyroid lobe IMPRESSION: No acute intracranial injury. No calvarial fracture. Small occipital scalp hematoma. Multiple remote infarcts with interval development of a right putaminal infarct since prior examination of 06/06/2019. No acute fracture or listhesis of the cervical spine, allowing for moderate limitation by motion artifact. Multilevel severe uncovertebral and facet arthrosis resulting in multilevel moderate to severe neural foraminal narrowing. Remote C6 compression fracture with approximately 50% loss of height. Asymmetric enlargement of the left thyroid lobe. This could be better assessed with dedicated thyroid sonography if indicated. Electronically Signed   By: Helyn Numbers MD   On:  10/09/2020 00:25   CT Cervical Spine Wo Contrast  Result Date: 10/09/2020 CLINICAL DATA:  Fall, head injury EXAM: CT HEAD WITHOUT CONTRAST CT CERVICAL SPINE WITHOUT CONTRAST TECHNIQUE: Multidetector CT imaging of the head and cervical spine was performed following the standard protocol without intravenous contrast. Multiplanar CT image reconstructions of the cervical spine were also generated. COMPARISON:  CT head 06/06/2019 FINDINGS: CT HEAD FINDINGS Brain: Normal anatomic configuration. Parenchymal volume loss is commensurate with the patient's age. Moderate periventricular white matter changes are present likely reflecting the sequela of small vessel ischemia. Remote lacunar infarcts are noted within the a left corona radiata, right putamen, and medial right cerebellar hemisphere. Right putaminal infarct is new since prior examination. No abnormal intra or extra-axial mass lesion or fluid collection. No abnormal mass effect or midline shift. No evidence of acute intracranial hemorrhage or infarct. Ventricular size is normal. Cerebellum unremarkable. Vascular: No asymmetric hyperdense vasculature at the skull base. Skull: Intact Sinuses/Orbits: Paranasal sinuses are clear. Orbits are unremarkable. Other: There is fluid opacification of a few inferior left mastoid air cells, nonspecific. Mastoid air cells and middle ear cavities are otherwise clear. Small occipital scalp hematoma noted. CT CERVICAL SPINE FINDINGS Assessment of the cervical spine is moderately limited by motion artifact Alignment: Normal cervical lordosis.  No listhesis. Skull base and vertebrae: The craniocervical junction is unremarkable. The atlantodental interval is normal. No acute fracture of the cervical spine. Soft tissues and spinal canal: No prevertebral fluid or swelling. No visible canal hematoma. Disc levels: Review of the sagittal reformats demonstrates preservation of cervical lordosis. A remote compression fracture of C6 is  identified with approximately 50% loss of height. Remaining vertebral body height has been preserved. There is intervertebral disc space narrowing and endplate remodeling at C6-7 with associated vacuum disc phenomena in keeping with changes of severe degenerative disc disease. Milder degenerative changes are noted at C5-6. Review of the a axial images demonstrates multilevel moderate to severe uncovertebral and facet arthrosis resulting in multilevel moderate to severe neural foraminal narrowing throughout the cervical spine and mild central canal stenosis at C3-4 and C5-6. Upper chest: Unremarkable Other: There is asymmetric enlargement and heterogeneous attenuation of the left thyroid lobe IMPRESSION: No acute intracranial injury. No calvarial fracture. Small occipital scalp hematoma. Multiple remote infarcts with interval development of a right putaminal infarct since prior examination of 06/06/2019. No acute fracture or listhesis of the cervical spine, allowing for moderate limitation by motion artifact. Multilevel severe uncovertebral and facet arthrosis resulting in multilevel moderate to severe neural foraminal narrowing. Remote C6 compression fracture with approximately 50% loss of height. Asymmetric enlargement of the left thyroid lobe. This could be better assessed with dedicated thyroid sonography if  indicated. Electronically Signed   By: Helyn NumbersAshesh  Parikh MD   On: 10/09/2020 00:25   DG Chest Portable 1 View  Result Date: 10/28/2020 CLINICAL DATA:  Dizziness and weakness, initial encounter EXAM: PORTABLE CHEST 1 VIEW COMPARISON:  10/17/2019 FINDINGS: Cardiac shadow is enlarged. Aortic calcifications are seen. Mild vascular congestion is noted with mild interstitial edema. No focal confluent infiltrate is seen. No bony abnormality is noted. IMPRESSION: CHF. Electronically Signed   By: Alcide CleverMark  Lukens M.D.   On: 10/28/2020 16:38    EKG: sinus rhythm with known RBBB and LAFB  ASSESSMENT AND PLAN:  1.  Acute on chronic diastolic CHF, with chest xray revealing mild vascular congestion and mild interstitial edema with mildly elevated BNP. Patient denies increased shortness of breath, orthopnea, PND, or peripheral edema. SpO2 was 91% on room air upon arrival. She was starting on IV Lasix with appropriate diuresis. 2. Hypertension 3. Type II diabetes 4. COPD  Recommendations:  1. Agree with current therapy 2. Continue IV Lasix with careful monitoring of renal status, I&Os, daily weight 3. 2D echocardiogram 4. Continue home aspirin and metoprolol tartrate 5. Further recommendations pending patient's initial course  Signed: Rockie Neighboursnna Boniface Goffe PA-C 10/29/2020, 8:46 AM

## 2020-10-29 NOTE — ED Notes (Signed)
External catheter placed on pt at this time.

## 2020-10-29 NOTE — ED Notes (Signed)
Pt given dinner tray.

## 2020-10-29 NOTE — ED Notes (Signed)
Admitting MD at bedside.

## 2020-10-29 NOTE — ED Notes (Signed)
Pt repositioned in bed by this RN and Connye Burkitt, Charity fundraiser.

## 2020-10-29 NOTE — Progress Notes (Signed)
PROGRESS NOTE    [name]  KYH:[id number] DOB: 02-27-1932 DOA: 10/28/2020 PCP: Lauro Regulus, MD   Brief Narrative:  86 y.o. Caucasian female with a known history of COPD, anxiety, depression, type 2 diabetes mellitus, GERD and dyslipidemia who presented to the emergency room with acute onset of dizziness with generalized weakness that started today.  She admits to dyspnea on exertion as well as orthopnea and paroxysmal nocturnal dyspnea.  No chest pain or palpitations.  No current nausea or vomiting or abdominal pain. No dysuria, oliguria or hematuria or flank pain.  12/22: Started on intravenous Lasix for acute decompensated heart failure suspected.  Symptomatically improved.  No pain complaints on my evaluation today.  Cardiology evaluation appreciated.   Assessment & Plan:   Active Problems:   Acute CHF (congestive heart failure) (HCC)   Acute decompensated heart failure (HCC)  Acute on chronic diastolic congestive heart failure Acute hypoxic respiratory failure, secondary to above.  Resolved Patient started on IV Lasix with improvement in volume status Weaned off supplemental oxygen Cardiology recommendations appreciated Plan: 2D echocardiogram IV Lasix Monitor renal function and electrolytes Strict I's and O's Daily weights  Essential hypertension Continue Norvasc Continue beta-blockade  Type 2 diabetes mellitus Oral agents held Check hemoglobin A1c Moderate sliding scale  Anxiety Depression Continue home regimen of Zoloft, Depakote, Seroquel     DVT prophylaxis: Lovenox Code Status: DNR/DNI Family Communication: Fonnie Birkenhead via phone [phone] on 10/29/2020 Disposition Plan: Status is: Inpatient  Remains inpatient appropriate because:Inpatient level of care appropriate due to severity of illness   Dispo: The patient is from: Home              Anticipated d/c is to: Home              Anticipated d/c date is: 1 day              Patient  currently is not medically stable to d/c.  Decompensated heart failure on IV Lasix.  Anticipate 24 additional hours prior to disposition planning.      Consultants:   Cardiology-[facility]  Procedures:   None  Antimicrobials:  None   Subjective: Seen and examined.  Reports improvement in shortness of breath since admission.  No pain complaints  Objective: Vitals:   10/29/20 1100 10/29/20 1115 10/29/20 1130 10/29/20 1200  BP:      Pulse: 62 62 64 64  Resp: 18 15 19 16   Temp:      TempSrc:      SpO2: 95% 96% 99% 97%  Weight:      Height:        Intake/Output Summary (Last 24 hours) at 10/29/2020 1244 Last data filed at 10/29/2020 1039 Gross per 24 hour  Intake 250 ml  Output 1125 ml  Net -875 ml   Filed Weights   10/28/20 1454  Weight: 76.4 kg    Examination:  General exam: No acute distress.  Appears frail Respiratory system: Bibasilar crackles.  Normal work of breathing.  Room air Cardiovascular system: S1 & S2 heard, RRR. No JVD, murmurs, rubs, gallops or clicks. No pedal edema. Gastrointestinal system: Abdomen is nondistended, soft and nontender. No organomegaly or masses felt. Normal bowel sounds heard. Central nervous system: Alert and oriented. No focal neurological deficits. Extremities: Symmetric 5 x 5 power. Skin: No rashes, lesions or ulcers Psychiatry: Judgement and insight appear normal. Mood & affect appropriate.     Data Reviewed: I have personally reviewed following labs and imaging  studies  CBC: Recent Labs  Lab 10/28/20 1512 10/29/20 0328  WBC 5.0 4.9  NEUTROABS 2.8  --   HGB 10.6* 10.7*  HCT 36.4 37.3  MCV 85.6 87.6  PLT 220 180   Basic Metabolic Panel: Recent Labs  Lab 10/28/20 1512 10/29/20 0328  NA 140 144  K 4.6 4.6  CL 108 110  CO2 26 26  GLUCOSE 141* 120*  BUN 29* 27*  CREATININE 0.88 0.82  CALCIUM 8.8* 8.6*   GFR: Estimated Creatinine Clearance: 45 mL/min (by C-G formula based on SCr of 0.82  mg/dL). Liver Function Tests: Recent Labs  Lab 10/28/20 1512  AST 188*  ALT 172*  ALKPHOS 138*  BILITOT 0.5  PROT 7.0  ALBUMIN 3.2*   No results for input(s): LIPASE, AMYLASE in the last 168 hours. No results for input(s): AMMONIA in the last 168 hours. Coagulation Profile: No results for input(s): INR, PROTIME in the last 168 hours. Cardiac Enzymes: No results for input(s): CKTOTAL, CKMB, CKMBINDEX, TROPONINI in the last 168 hours. BNP (last 3 results) No results for input(s): PROBNP in the last 8760 hours. HbA1C: No results for input(s): HGBA1C in the last 72 hours. CBG: No results for input(s): GLUCAP in the last 168 hours. Lipid Profile: No results for input(s): CHOL, HDL, LDLCALC, TRIG, CHOLHDL, LDLDIRECT in the last 72 hours. Thyroid Function Tests: No results for input(s): TSH, T4TOTAL, FREET4, T3FREE, THYROIDAB in the last 72 hours. Anemia Panel: No results for input(s): VITAMINB12, FOLATE, FERRITIN, TIBC, IRON, RETICCTPCT in the last 72 hours. Sepsis Labs: Recent Labs  Lab 10/28/20 1518 10/28/20 1632  LATICACIDVEN 1.2 0.9    Recent Results (from the past 240 hour(s))  Resp Panel by RT-PCR (Flu A&B, Covid) Nasopharyngeal Swab     Status: None   Collection Time: 10/28/20  3:12 PM   Specimen: Nasopharyngeal Swab; Nasopharyngeal(NP) swabs in vial transport medium  Result Value Ref Range Status   SARS Coronavirus 2 by RT PCR NEGATIVE NEGATIVE Final    Comment: (NOTE) SARS-CoV-2 target nucleic acids are NOT DETECTED.  The SARS-CoV-2 RNA is generally detectable in upper respiratory specimens during the acute phase of infection. The lowest concentration of SARS-CoV-2 viral copies this assay can detect is 138 copies/mL. A negative result does not preclude SARS-Cov-2 infection and should not be used as the sole basis for treatment or other patient management decisions. A negative result may occur with  improper specimen collection/handling, submission of specimen  other than nasopharyngeal swab, presence of viral mutation(s) within the areas targeted by this assay, and inadequate number of viral copies(<138 copies/mL). A negative result must be combined with clinical observations, patient history, and epidemiological information. The expected result is Negative.  Fact Sheet for Patients:  BloggerCourse.com  Fact Sheet for Healthcare Providers:  SeriousBroker.it  This test is no t yet approved or cleared by the [place] FDA and  has been authorized for detection and/or diagnosis of SARS-CoV-2 by FDA under an Emergency Use Authorization (EUA). This EUA will remain  in effect (meaning this test can be used) for the duration of the COVID-19 declaration under Section 564(b)(1) of the Act, 21 U.S.C.section 360bbb-3(b)(1), unless the authorization is terminated  or revoked sooner.       Influenza A by PCR NEGATIVE NEGATIVE Final   Influenza B by PCR NEGATIVE NEGATIVE Final    Comment: (NOTE) The Xpert Xpress SARS-CoV-2/FLU/RSV plus assay is intended as an aid in the diagnosis of influenza from Nasopharyngeal swab specimens and should not  be used as a sole basis for treatment. Nasal washings and aspirates are unacceptable for Xpert Xpress SARS-CoV-2/FLU/RSV testing.  Fact Sheet for Patients: BloggerCourse.com  Fact Sheet for Healthcare Providers: SeriousBroker.it  This test is not yet approved or cleared by the [place] FDA and has been authorized for detection and/or diagnosis of SARS-CoV-2 by FDA under an Emergency Use Authorization (EUA). This EUA will remain in effect (meaning this test can be used) for the duration of the COVID-19 declaration under Section 564(b)(1) of the Act, 21 U.S.C. section 360bbb-3(b)(1), unless the authorization is terminated or revoked.  Performed at [facility], [address],  [place], [place] [address]          Radiology Studies: DG Chest Portable 1 View  Result Date: 10/28/2020 CLINICAL DATA:  Dizziness and weakness, initial encounter EXAM: PORTABLE CHEST 1 VIEW COMPARISON:  10/17/2019 FINDINGS: Cardiac shadow is enlarged. Aortic calcifications are seen. Mild vascular congestion is noted with mild interstitial edema. No focal confluent infiltrate is seen. No bony abnormality is noted. IMPRESSION: CHF. Electronically Signed   By: Alcide Clever M.D.   On: 10/28/2020 16:38        Scheduled Meds: . amLODipine  5 mg Oral Daily  . aspirin EC  81 mg Oral Daily  . cholecalciferol  2,000 Units Oral Daily  . dicyclomine  20 mg Oral QID  . divalproex  250 mg Oral QHS  . enoxaparin (LOVENOX) injection  40 mg Subcutaneous Q24H  . feeding supplement  237 mL Oral BID BM  . furosemide  40 mg Intravenous Q12H  . ipratropium-albuterol  3 mL Nebulization BID  . loratadine  10 mg Oral Daily  . metoprolol tartrate  25 mg Oral BID  . multivitamin with minerals  1 tablet Oral Daily  . olopatadine  1 drop Both Eyes Daily  . pantoprazole  40 mg Oral Daily  . QUEtiapine  12.5 mg Oral Daily   Continuous Infusions:   LOS: 1 day    Time spent: 25 minutes    Tresa Moore, MD [facility] Pager [phone]  If 7PM-7AM, please contact night-coverage 10/29/2020, 12:44 PM

## 2020-10-29 NOTE — Consult Note (Signed)
   Heart Failure Nurse Navigator Note  HFpEF by cardiogram performed in 2018 which revealed an ejection fraction of 70%. Grade 2 diastolic dysfunction. Moderate left atrial enlargement. Mild right atrial enlargement. Normal right ventricular systolic function. Echocardiogram is pending on this admission.  She presented from the care facility with complaints of dizziness and lightheadedness. By chest x-ray was found to have mild vascular congestion and mild interstitial edema.  Comorbidities:  Obesity COPD Type 2 diabetes Hypertension Hyperlipidemia  Medications:  Amlodipine 5 mg daily Aspirin 81 mg daily  furosemide 40 mg IV every 12  Labs:  Sodium 144, potassium 4.6, chloride 110, CO2 26, BUN 27, creatinine 0.82, BNP 224, hemoglobin 10.7, hematocrit 37.3, lactic acid 0.9. Blood pressure 143/68 Weight 76.4 kg BMI 32.91    Assessment:  General-she is awake and alert lying on a gurney in the emergency room in no acute distress.  HEENT-edentulous, no JVD,  Cardiac-heart tones of regular rate and rhythm no murmur or gallop appreciated.  Chest-breath sounds diminished in the bases.  Abdomen-rounded soft nontender.  Musculoskeletal-there is no lower extremity edema noted  Psych-she is pleasant and appropriate  Neurologic-speech is clear moves all extremities without difficulty.    Met with patient, initial visit. She states that she lives in a care facility and she does not use salt at the table. She states that she is active and participates daily with ambulation down the hall with her walker, she did not note any shortness of breath with that activity.  She is states that the home that she gets weighed monthly. Discussed with her weighing daily and then that way a 2 to 3 pound weight gain overnight or 5 pounds within a week could be reported to her physician and avoid hospitalizations.  Was given heart failure teaching booklet along with information about the  outpatient heart failure clinic.   Pricilla Riffle RN, CHFN

## 2020-10-29 NOTE — ED Notes (Signed)
Pt alert and talking a lot.  Pt waiting on bed assignment.  No acute distress at this time.

## 2020-10-29 NOTE — ED Notes (Signed)
Pt repositioned for comfort and purewick checked.  Pt is in no distress, has had some snacks, waiting on meal trays.

## 2020-10-29 NOTE — TOC Initial Note (Signed)
Transition of Care [facility]) - Initial/Assessment Note    Patient Details  Name: [name] MRN: [id number] Date of Birth: 12-05-1931  Transition of Care [facility]) CM/SW Contact:    Eilleen Kempf, LCSW Phone Number: 10/29/2020, 9:23 AM  Clinical Narrative:                 Patient is a resident at [facility], Son, [name] is HCPOA. CSW requested Pt/Ot evaluations to determine needs for discharge plan        Patient Goals and CMS Choice        Expected Discharge Plan and Services                                                Prior Living Arrangements/Services                       Activities of Daily Living      Permission Sought/Granted                  Emotional Assessment              Admission diagnosis:  Acute CHF (congestive heart failure) (HCC) [I50.9] Acute decompensated heart failure (HCC) [I50.9] Patient Active Problem List   Diagnosis Date Noted  . Acute decompensated heart failure (HCC) 10/29/2020  . Acute CHF (congestive heart failure) (HCC) 10/28/2020  . Pneumonia due to COVID-19 virus 10/16/2019  . Elevated brain natriuretic peptide (BNP) level 10/16/2019  . Lactic acidosis 10/16/2019  . Acute on chronic respiratory failure with hypoxia (HCC) 10/15/2019  . Suspected COVID-19 virus infection 10/15/2019  . HTN (hypertension) 10/15/2019  . Chronic diastolic (congestive) heart failure (HCC) 10/15/2019  . Hyperkalemia 10/15/2019  . CKD (chronic kidney disease), stage IIIa 10/15/2019  . Hypoxia 05/23/2019  . Acute delirium 08/16/2018  . Dementia with behavioral disturbance (HCC) 08/16/2018  . Encephalopathy acute 08/11/2018  . Sepsis (HCC) 05/21/2017  . Aspiration pneumonia (HCC) 05/21/2017  . GERD (gastroesophageal reflux disease) 05/21/2017  . HLD (hyperlipidemia) 05/21/2017  . Depression 05/21/2017  . Anxiety 05/21/2017  . Elevated troponin 05/21/2017  . Diabetes (HCC) 05/21/2017  . Right humeral fracture  05/21/2017  . Nausea and vomiting 03/31/2015  . Microcytic anemia 03/31/2015   PCP:  Lauro Regulus, MD Pharmacy:   [facility] [place], [place] - [address] [place] [address] Phone: [phone] Fax: [phone]     Social Determinants of Health (SDOH) Interventions    Readmission Risk Interventions Readmission Risk Prevention Plan 05/28/2019 05/27/2019 05/24/2019  Transportation Screening Complete Complete Complete  Social Work Consult for Recovery Care Planning/Counseling - Complete -  Medication Review Oceanographer) Complete Complete Complete  PCP or Specialist appointment within 3-5 days of discharge (No Data) - -  HRI or Home Care Consult Complete - -  Palliative Care Screening Not Applicable - -  Skilled Nursing Facility Not Applicable - -  Some recent data might be hidden

## 2020-10-29 NOTE — ED Notes (Signed)
With nurse tech in room with this rn, meds given and pt was cooperative.

## 2020-10-29 NOTE — ED Notes (Signed)
Pt has eaten lunch (spaghetti) and was repositioned for comfort.

## 2020-10-29 NOTE — ED Notes (Signed)
This RN at bedside. PT purewick dislodge and pt and sheets soiled. Pt cleaned. New sheets, brief and purewick placed on pt. Pt given diet coke per request. Pt denies any further needs at this time.

## 2020-10-29 NOTE — Evaluation (Signed)
Physical Therapy Evaluation Patient Details Name: Novice Vrba MRN: [id number] DOB: 1932-06-15 Today's Date: 10/29/2020   History of Present Illness  86 y.o. Caucasian female with a known history of COPD, anxiety, depression, type 2 diabetes mellitus, GERD and dyslipidemia who presented to the emergency room with acute onset of dizziness with generalized weakness that started today. Pt started on Lasix and adm for acute decompensated heart failure on CHF.  Clinical Impression  Patient received on stretcher in ED. She is agreeable to PT assessment. Reports she gets PT at home. She requires min guard/assist with bed mobility (mainly due to being on stretcher). Requires feet to be blocked when performing sit to stand from high surface. Able to take a few steps with RW and min guard. She feels like her feet are sliding and requests to sit back down. Patient will continue to benefit from skilled PT while here to improve functional independence, safety and strength.         Follow Up Recommendations Home health PT    Equipment Recommendations  None recommended by PT    Recommendations for Other Services       Precautions / Restrictions Precautions Precautions: Fall Restrictions Weight Bearing Restrictions: No      Mobility  Bed Mobility Overal bed mobility: Modified Independent (Simultaneous filing. User may not have seen previous data.) Bed Mobility: Supine to Sit;Sit to Supine     Supine to sit: Min assist Sit to supine: Min assist   General bed mobility comments: increased time, close supervision due to being on high stretcher in ED    Transfers Overall transfer level: Needs assistance (Simultaneous filing. User may not have seen previous data.) Equipment used: Rolling walker (2 wheeled) Transfers: Sit to/from Stand Sit to Stand: Min assist Stand pivot transfers: Min assist;Mod assist       General transfer comment: Patient requires min guard/assist to get back up onto  stretcher and assist to block feet from sliding.  Ambulation/Gait Ambulation/Gait assistance: Min assist Gait Distance (Feet): 3 Feet Assistive device: Rolling walker (2 wheeled) Gait Pattern/deviations: Step-to pattern;Decreased step length - right;Decreased step length - left Gait velocity: decr   General Gait Details: patient able to take a few side steps, but is not comfortable doing more at this time as she feels like the floor is slippery.  Stairs            Wheelchair Mobility    Modified Rankin (Stroke Patients Only)       Balance Overall balance assessment: Needs assistance Sitting-balance support: Feet unsupported Sitting balance-Leahy Scale: Good Sitting balance - Comments: patient unable to reach floor on high stretcher in sitting     Standing balance-Leahy Scale: Poor Standing balance comment: requires B UE support and MIN A                             Pertinent Vitals/Pain Pain Assessment: Faces Faces Pain Scale: Hurts a little bit Pain Location: back Pain Descriptors / Indicators: Aching;Discomfort Pain Intervention(s): Monitored during session;Repositioned    Home Living Family/patient expects to be discharged to:: Assisted living               Home Equipment: Walker - 2 wheels;Shower seat - built in      Prior Function Level of Independence: Needs assistance   Gait / Transfers Assistance Needed: Pt reports she is able to walk with a walker and assistance and has seen PT in  the ALF setting  ADL's / Homemaking Assistance Needed: Pt reports that facility staff performs all IADLs, but that she is able to do basic self care. States she does have an aide help her with bathing and LB dressing        Hand Dominance        Extremity/Trunk Assessment   Upper Extremity Assessment Upper Extremity Assessment: Defer to OT evaluation    Lower Extremity Assessment Lower Extremity Assessment: Generalized weakness    Cervical /  Trunk Assessment Cervical / Trunk Assessment: Normal  Communication   Communication: No difficulties  Cognition Arousal/Alertness: Awake/alert Behavior During Therapy: WFL for tasks assessed/performed Overall Cognitive Status: Within Functional Limits for tasks assessed                                 General Comments: unsure of patient baseline, she is oriented to place, self and some aspects of situation, but not time. Pt PLOF that she reports seems to check out with chart review.      General Comments      Exercises Other Exercises Other Exercises: OT facilitates ed re: role of OT. Pt with moderate carryover. OT engages pt in UB bathing/dressing/grooming with SETUP and MOD A for sonning non-skid socks in sitting. Pt requires MIN A to CTS With RW from stretcher and MIN A for fxl mobility to commode as well as MIN/MOD A for SPS to/from commode in room in ED. Pt tolerates well and has BM during session. RN notified. Requires MOD A for standing posterior peri care. Returned to stretcher with rails up and call light in reach.   Assessment/Plan    PT Assessment Patient needs continued PT services  PT Problem List Decreased strength;Decreased mobility;Decreased activity tolerance;Decreased balance       PT Treatment Interventions DME instruction;Therapeutic activities;Gait training;Therapeutic exercise;Balance training;Functional mobility training;Patient/family education    PT Goals (Current goals can be found in the Care Plan section)  Acute Rehab PT Goals Patient Stated Goal: to return home PT Goal Formulation: With patient Time For Goal Achievement: 11/01/20 Potential to Achieve Goals: Good    Frequency Min 2X/week   Barriers to discharge Decreased caregiver support      Co-evaluation               AM-PAC PT "6 Clicks" Mobility  Outcome Measure Help needed turning from your back to your side while in a flat bed without using bedrails?: None Help  needed moving from lying on your back to sitting on the side of a flat bed without using bedrails?: A Little Help needed moving to and from a bed to a chair (including a wheelchair)?: A Little Help needed standing up from a chair using your arms (e.g., wheelchair or bedside chair)?: A Little Help needed to walk in hospital room?: A Lot Help needed climbing 3-5 steps with a railing? : A Lot 6 Click Score: 17    End of Session Equipment Utilized During Treatment: Gait belt Activity Tolerance: Patient tolerated treatment well Patient left: in bed;with call bell/phone within reach Nurse Communication: Mobility status PT Visit Diagnosis: Unsteadiness on feet (R26.81);Muscle weakness (generalized) (M62.81);Difficulty in walking, not elsewhere classified (R26.2)    Time: 1245-1311 PT Time Calculation (min) (ACUTE ONLY): 26 min   Charges:   PT Evaluation $PT Eval Moderate Complexity: 1 Mod PT Treatments $Therapeutic Activity: 8-22 mins        Lissa Merlin, PT,  GCS 10/29/20,1:23 PM

## 2020-10-30 ENCOUNTER — Encounter: Payer: Self-pay | Admitting: Internal Medicine

## 2020-10-30 DIAGNOSIS — I509 Heart failure, unspecified: Secondary | ICD-10-CM

## 2020-10-30 LAB — GLUCOSE, CAPILLARY
Glucose-Capillary: 123 mg/dL — ABNORMAL HIGH (ref 70–99)
Glucose-Capillary: 183 mg/dL — ABNORMAL HIGH (ref 70–99)
Glucose-Capillary: 129 mg/dL — ABNORMAL HIGH (ref 70–99)
Glucose-Capillary: 174 mg/dL — ABNORMAL HIGH (ref 70–99)

## 2020-10-30 LAB — MRSA PCR SCREENING: MRSA by PCR: NEGATIVE

## 2020-10-30 LAB — MAGNESIUM: Magnesium: 1.7 mg/dL (ref 1.7–2.4)

## 2020-10-30 LAB — BASIC METABOLIC PANEL
Anion gap: 8 (ref 5–15)
BUN: 21 mg/dL (ref 8–23)
CO2: 31 mmol/L (ref 22–32)
Calcium: 9.1 mg/dL (ref 8.9–10.3)
Chloride: 102 mmol/L (ref 98–111)
Creatinine, Ser: 0.68 mg/dL (ref 0.44–1.00)
GFR, Estimated: >60 mL/min (ref >60)
Glucose, Bld: 131 mg/dL — ABNORMAL HIGH (ref 70–99)
Potassium: 4.5 mmol/L (ref 3.5–5.1)
Sodium: 141 mmol/L (ref 135–145)

## 2020-10-30 MED ORDER — FUROSEMIDE 10 MG/ML IJ SOLN
40.0000 mg | Freq: Two times a day (BID) | INTRAMUSCULAR | Status: DC
  Administered 2020-10-30 – 2020-11-01 (×4): 40 mg via INTRAVENOUS
  Filled 2020-10-30 (×5): qty 4

## 2020-10-30 MED ORDER — SERTRALINE HCL 50 MG PO TABS
50.0000 mg | ORAL_TABLET | Freq: Every day | ORAL | Status: DC
  Administered 2020-10-30 – 2020-11-03 (×5): 50 mg via ORAL
  Filled 2020-10-30 (×5): qty 1

## 2020-10-30 MED ORDER — LOPERAMIDE HCL 2 MG PO CAPS: 2.0000 mg | ORAL_CAPSULE | ORAL | Status: DC | PRN

## 2020-10-30 MED ORDER — IPRATROPIUM-ALBUTEROL 0.5-2.5 (3) MG/3ML IN SOLN
3.0000 mL | Freq: Two times a day (BID) | RESPIRATORY_TRACT | Status: DC
  Administered 2020-10-31 – 2020-11-01 (×2): 3 mL via RESPIRATORY_TRACT
  Filled 2020-10-30 (×3): qty 3

## 2020-10-30 MED ORDER — COVID-19 MRNA VACCINE (PFIZER) 30 MCG/0.3ML IM SUSP
0.3000 mL | Freq: Once | INTRAMUSCULAR | Status: AC
  Filled 2020-10-30: qty 0.3

## 2020-10-30 NOTE — NC FL2 (Signed)
[place] MEDICAID FL2 LEVEL OF CARE SCREENING TOOL     IDENTIFICATION  Patient Name: [name] Birthdate: 1932-10-17 Sex: female Admission Date (Current Location): 10/28/2020  [place] and [place] Number:  Chiropodist and Address:  [facility], [address] [place], [place] [address]      Provider Number: [id number]  Attending Physician Name and Address:  Tresa Moore, MD  Relative Name and Phone Number:       Current Level of Care: Hospital Recommended Level of Care: Memory Care,Assisted Living Facility Prior Approval Number:    Date Approved/Denied:   PASRR Number:    Discharge Plan: Other (Comment) (Memory Care)    Current Diagnoses: Patient Active Problem List   Diagnosis Date Noted  . Acute decompensated heart failure (HCC) 10/29/2020  . Acute CHF (congestive heart failure) (HCC) 10/28/2020  . Pneumonia due to COVID-19 virus 10/16/2019  . Elevated brain natriuretic peptide (BNP) level 10/16/2019  . Lactic acidosis 10/16/2019  . Acute on chronic respiratory failure with hypoxia (HCC) 10/15/2019  . Suspected COVID-19 virus infection 10/15/2019  . HTN (hypertension) 10/15/2019  . Chronic diastolic (congestive) heart failure (HCC) 10/15/2019  . Hyperkalemia 10/15/2019  . CKD (chronic kidney disease), stage IIIa 10/15/2019  . Hypoxia 05/23/2019  . Acute delirium 08/16/2018  . Dementia with behavioral disturbance (HCC) 08/16/2018  . Encephalopathy acute 08/11/2018  . Sepsis (HCC) 05/21/2017  . Aspiration pneumonia (HCC) 05/21/2017  . GERD (gastroesophageal reflux disease) 05/21/2017  . HLD (hyperlipidemia) 05/21/2017  . Depression 05/21/2017  . Anxiety 05/21/2017  . Elevated troponin 05/21/2017  . Diabetes (HCC) 05/21/2017  . Right humeral fracture 05/21/2017  . Nausea and vomiting 03/31/2015  . Microcytic anemia 03/31/2015    Orientation RESPIRATION BLADDER Height & Weight     Self,Place,Time  Normal  Incontinent Weight: 152 lb 12.5 oz (69.3 kg) Height:  5' (152.4 cm)  BEHAVIORAL SYMPTOMS/MOOD NEUROLOGICAL BOWEL NUTRITION STATUS      Incontinent Diet (2 gram sodium)  AMBULATORY STATUS COMMUNICATION OF NEEDS Skin   Limited Assist Verbally Normal                       Personal Care Assistance Level of Assistance  Bathing,Feeding,Dressing Bathing Assistance: Limited assistance Feeding assistance: Independent Dressing Assistance: Limited assistance     Functional Limitations Info  Sight,Hearing,Speech Sight Info: Adequate Hearing Info: Adequate Speech Info: Adequate    SPECIAL CARE FACTORS FREQUENCY  PT (By licensed PT),OT (By licensed OT)     PT Frequency: 2x OT Frequency: 2x            Contractures Contractures Info: Not present    Additional Factors Info  Code Status,Allergies Code Status Info: DNR Allergies Info: Ampicillin, Sulfa Antibiotics           Current Medications (10/30/2020):  This is the current hospital active medication list Current Facility-Administered Medications  Medication Dose Route Frequency Provider Last Rate Last Admin  . acetaminophen (TYLENOL) tablet 650 mg  650 mg Oral Q6H PRN Mansy, Jan A, MD   650 mg at 10/30/20 1230   Or  . acetaminophen (TYLENOL) suppository 650 mg  650 mg Rectal Q6H PRN Mansy, Jan A, MD      . alum & mag hydroxide-simeth (MAALOX/MYLANTA) 200-200-20 MG/5ML suspension 30 mL  30 mL Oral PRN Mansy, Jan A, MD      . amLODipine (NORVASC) tablet 5 mg  5 mg Oral Daily Mansy, Vernetta Honey, MD  5 mg at 10/30/20 0926  . aspirin EC tablet 81 mg  81 mg Oral Daily Mansy, Jan A, MD   81 mg at 10/30/20 3500  . bismuth subsalicylate (PEPTO BISMOL) 262 MG/15ML suspension 15 mL  15 mL Oral Q2H PRN Mansy, Jan A, MD      . calcium carbonate (TUMS - dosed in mg elemental calcium) chewable tablet 400 mg of elemental calcium  400 mg of elemental calcium Oral TID PRN Mansy, Jan A, MD      . cholecalciferol (VITAMIN D3) tablet 2,000 Units   2,000 Units Oral Daily Mansy, Vernetta Honey, MD   2,000 Units at 10/30/20 0926  . [START ON 10/31/2020] COVID-19 mRNA vaccine (Pfizer) injection 0.3 mL  0.3 mL Intramuscular ONCE-1600 Sreenath, Sudheer B, MD      . dicyclomine (BENTYL) tablet 20 mg  20 mg Oral QID Mansy, Jan A, MD   20 mg at 10/30/20 1226  . divalproex (DEPAKOTE ER) 24 hr tablet 250 mg  250 mg Oral QHS Mansy, Jan A, MD   250 mg at 10/29/20 2140  . enoxaparin (LOVENOX) injection 40 mg  40 mg Subcutaneous Q24H Mansy, Jan A, MD   40 mg at 10/29/20 2131  . feeding supplement (ENSURE ENLIVE / ENSURE PLUS) liquid 237 mL  237 mL Oral BID BM Mansy, Jan A, MD   237 mL at 10/30/20 0930  . furosemide (LASIX) injection 40 mg  40 mg Intravenous Q12H Lolita Patella B, MD   40 mg at 10/30/20 1007  . insulin aspart (novoLOG) injection 0-15 Units  0-15 Units Subcutaneous TID WC Lolita Patella B, MD   3 Units at 10/30/20 1227  . insulin aspart (novoLOG) injection 0-5 Units  0-5 Units Subcutaneous QHS Sreenath, Sudheer B, MD      . ipratropium-albuterol (DUONEB) 0.5-2.5 (3) MG/3ML nebulizer solution 3 mL  3 mL Nebulization BID Mansy, Jan A, MD   3 mL at 10/30/20 0839  . ketotifen (ZADITOR) 0.025 % ophthalmic solution 1 drop  1 drop Both Eyes BID PRN Mansy, Jan A, MD      . loperamide (IMODIUM) capsule 2 mg  2 mg Oral PRN Georgeann Oppenheim, Sudheer B, MD      . loratadine (CLARITIN) tablet 10 mg  10 mg Oral Daily Mansy, Jan A, MD   10 mg at 10/30/20 0925  . LORazepam (ATIVAN) tablet 0.25 mg  0.25 mg Oral BID PRN Mansy, Jan A, MD      . magnesium hydroxide (MILK OF MAGNESIA) suspension 30 mL  30 mL Oral Daily PRN Mansy, Jan A, MD      . magnesium hydroxide (MILK OF MAGNESIA) suspension 30 mL  30 mL Oral Daily PRN Mansy, Jan A, MD      . metoprolol tartrate (LOPRESSOR) tablet 25 mg  25 mg Oral BID Mansy, Jan A, MD   25 mg at 10/30/20 9381  . multivitamin with minerals tablet 1 tablet  1 tablet Oral Daily Mansy, Jan A, MD   1 tablet at 10/30/20 0925  . olopatadine  (PATANOL) 0.1 % ophthalmic solution 1 drop  1 drop Both Eyes Daily Mansy, Jan A, MD   1 drop at 10/30/20 1006  . ondansetron (ZOFRAN) tablet 4 mg  4 mg Oral Q6H PRN Mansy, Jan A, MD       Or  . ondansetron [facility]) injection 4 mg  4 mg Intravenous Q6H PRN Mansy, Jan A, MD      . ondansetron [facility]) tablet 4 mg  4 mg Oral Q8H PRN Mansy, Jan A, MD      . pantoprazole (PROTONIX) EC tablet 40 mg  40 mg Oral Daily Mansy, Jan A, MD   40 mg at 10/30/20 0926  . QUEtiapine (SEROQUEL) tablet 12.5 mg  12.5 mg Oral Daily Mansy, Jan A, MD   12.5 mg at 10/30/20 0925  . sodium phosphate Pediatric (FLEET) enema 1 enema  1 enema Rectal Once PRN Mansy, Jan A, MD      . traZODone (DESYREL) tablet 25 mg  25 mg Oral QHS PRN Mansy, Vernetta Honey, MD         Discharge Medications: Please see discharge summary for a list of discharge medications.  Relevant Imaging Results:  Relevant Lab Results:   Additional Information SSN; [id number]  Maree Krabbe, LCSW

## 2020-10-30 NOTE — TOC Progression Note (Signed)
Transition of Care [facility]) - Progression Note    Patient Details  Name: Schae Cando MRN: [id number] Date of Birth: 12/10/1931  Transition of Care [facility]) CM/SW Contact  Maree Krabbe, LCSW Phone Number: 10/30/2020, 12:22 PM  Clinical Narrative:   Spoke with Supervisor at [facility] [phone]) and she request CSW send clinicals. Pt should be able to return back to ALF with [facility] tomorrow. ALF uses [facility]. [facility] will service pt at ALF. Will need HH orders. Pt to transfer tomorrow.         Expected Discharge Plan and Services                                                 Social Determinants of Health (SDOH) Interventions    Readmission Risk Interventions Readmission Risk Prevention Plan 05/28/2019 05/27/2019 05/24/2019  Transportation Screening Complete Complete Complete  Social Work Consult for Recovery Care Planning/Counseling - Complete -  Medication Review Oceanographer) Complete Complete Complete  PCP or Specialist appointment within 3-5 days of discharge (No Data) - -  HRI or Home Care Consult Complete - -  Palliative Care Screening Not Applicable - -  Skilled Nursing Facility Not Applicable - -  Some recent data might be hidden

## 2020-10-30 NOTE — Progress Notes (Signed)
PROGRESS NOTE    [name]  QMV:[id number] DOB: 08/22/1932 DOA: 10/28/2020 PCP: Lauro Regulus, MD   Brief Narrative:  86 y.o. Caucasian female with a known history of COPD, anxiety, depression, type 2 diabetes mellitus, GERD and dyslipidemia who presented to the emergency room with acute onset of dizziness with generalized weakness that started today.  She admits to dyspnea on exertion as well as orthopnea and paroxysmal nocturnal dyspnea.  No chest pain or palpitations.  No current nausea or vomiting or abdominal pain. No dysuria, oliguria or hematuria or flank pain.  12/22: Started on intravenous Lasix for acute decompensated heart failure suspected.  Symptomatically improved.  No pain complaints on my evaluation today.  Cardiology evaluation appreciated.  12/23: Patient symptomatically improved.  Lost IV access last night.  Now agreeable to replacement.  Net -1 L   Assessment & Plan:   Active Problems:   Acute CHF (congestive heart failure) (HCC)   Acute decompensated heart failure (HCC)  Acute on chronic diastolic congestive heart failure Acute hypoxic respiratory failure, secondary to above.  Resolved Patient started on IV Lasix with improvement in volume status Weaned off supplemental oxygen Cardiology recommendations appreciated Echocardiogram with grade 1 diastolic dysfunction and normal ejection fraction Plan: Continue IV Lasix 40 mg twice daily Daily BMP Monitor renal function and electrolytes Strict I's and O's Daily weights  Essential hypertension Continue Norvasc Continue beta-blockade  Type 2 diabetes mellitus Oral agents held Check hemoglobin A1c Moderate sliding scale  Anxiety Depression Continue home regimen of Zoloft, Depakote, Seroquel     DVT prophylaxis: Lovenox Code Status: DNR/DNI Family Communication: Fonnie Birkenhead via phone [phone] on 10/29/2020 Disposition Plan: Status is: Inpatient  Remains inpatient appropriate  because:Inpatient level of care appropriate due to severity of illness   Dispo: The patient is from: Home              Anticipated d/c is to: Home              Anticipated d/c date is: 1 day              Patient currently is not medically stable to d/c.   Anticipate 1 additional day of IV diuretics.  Plan to transition to p.o. diuretics and discharged home with home health tomorrow.  Therapy recommending home health.    Consultants:   Cardiology-[facility]  Procedures:   None  Antimicrobials:  None   Subjective: Patient seen and examined.  Feeling well.  Improvement in shortness of breath.  Weaned off supplemental oxygen.  No pain complaints  Objective: Vitals:   10/30/20 0107 10/30/20 0428 10/30/20 0430 10/30/20 1013  BP: (!) 157/68  (!) 158/68 (!) 135/42  Pulse: (!) 52  71 71  Resp: Temp: 97.9 F (36.6 C)  97.8 F (36.6 C) 98.4 F (36.9 C)  TempSrc: Oral  Oral Oral  SpO2: 94%  95% 95%  Weight:  69.3 kg    Height:        Intake/Output Summary (Last 24 hours) at 10/30/2020 1019 Last data filed at 10/29/2020 1849 Gross per 24 hour  Intake --  Output 1200 ml  Net -1200 ml   Filed Weights   10/28/20 1454 10/30/20 0428  Weight: 76.4 kg 69.3 kg    Examination:  General exam: No acute distress.  Appears frail Respiratory system: Bibasilar crackles.  Normal work of breathing.  Room air Cardiovascular system: S1 & S2 heard, RRR. No JVD, murmurs, rubs, gallops  or clicks. No pedal edema. Gastrointestinal system: Abdomen is nondistended, soft and nontender. No organomegaly or masses felt. Normal bowel sounds heard. Central nervous system: Alert and oriented. No focal neurological deficits. Extremities: Symmetric 5 x 5 power. Skin: No rashes, lesions or ulcers Psychiatry: Judgement and insight appear normal. Mood & affect appropriate.     Data Reviewed: I have personally reviewed following labs and imaging studies  CBC: Recent Labs  Lab  10/28/20 1512 10/29/20 0328  WBC 5.0 4.9  NEUTROABS 2.8  --   HGB 10.6* 10.7*  HCT 36.4 37.3  MCV 85.6 87.6  PLT 220 180   Basic Metabolic Panel: Recent Labs  Lab 10/28/20 1512 10/29/20 0328 10/30/20 0905  NA 140 144 141  K 4.6 4.6 4.5  CL 108 110 102  CO2 26 26 31   GLUCOSE 141* 120* 131*  BUN 29* 27* 21  CREATININE 0.88 0.82 0.68  CALCIUM 8.8* 8.6* 9.1  MG  --   --  1.7   GFR: Estimated Creatinine Clearance: 43.8 mL/min (by C-G formula based on SCr of 0.68 mg/dL). Liver Function Tests: Recent Labs  Lab 10/28/20 1512  AST 188*  ALT 172*  ALKPHOS 138*  BILITOT 0.5  PROT 7.0  ALBUMIN 3.2*   No results for input(s): LIPASE, AMYLASE in the last 168 hours. No results for input(s): AMMONIA in the last 168 hours. Coagulation Profile: No results for input(s): INR, PROTIME in the last 168 hours. Cardiac Enzymes: No results for input(s): CKTOTAL, CKMB, CKMBINDEX, TROPONINI in the last 168 hours. BNP (last 3 results) No results for input(s): PROBNP in the last 8760 hours. HbA1C: Recent Labs    10/29/20 0328  HGBA1C 6.4*   CBG: Recent Labs  Lab 10/29/20 1658 10/29/20 2133 10/30/20 0800  GLUCAP 132* 118* 123*   Lipid Profile: No results for input(s): CHOL, HDL, LDLCALC, TRIG, CHOLHDL, LDLDIRECT in the last 72 hours. Thyroid Function Tests: No results for input(s): TSH, T4TOTAL, FREET4, T3FREE, THYROIDAB in the last 72 hours. Anemia Panel: No results for input(s): VITAMINB12, FOLATE, FERRITIN, TIBC, IRON, RETICCTPCT in the last 72 hours. Sepsis Labs: Recent Labs  Lab 10/28/20 1518 10/28/20 1632  LATICACIDVEN 1.2 0.9    Recent Results (from the past 240 hour(s))  Resp Panel by RT-PCR (Flu A&B, Covid) Nasopharyngeal Swab     Status: None   Collection Time: 10/28/20  3:12 PM   Specimen: Nasopharyngeal Swab; Nasopharyngeal(NP) swabs in vial transport medium  Result Value Ref Range Status   SARS Coronavirus 2 by RT PCR NEGATIVE NEGATIVE Final    Comment:  (NOTE) SARS-CoV-2 target nucleic acids are NOT DETECTED.  The SARS-CoV-2 RNA is generally detectable in upper respiratory specimens during the acute phase of infection. The lowest concentration of SARS-CoV-2 viral copies this assay can detect is 138 copies/mL. A negative result does not preclude SARS-Cov-2 infection and should not be used as the sole basis for treatment or other patient management decisions. A negative result may occur with  improper specimen collection/handling, submission of specimen other than nasopharyngeal swab, presence of viral mutation(s) within the areas targeted by this assay, and inadequate number of viral copies(<138 copies/mL). A negative result must be combined with clinical observations, patient history, and epidemiological information. The expected result is Negative.  Fact Sheet for Patients:  10/30/20  Fact Sheet for Healthcare Providers:  BloggerCourse.com  This test is no t yet approved or cleared by the [place] FDA and  has been authorized for detection and/or diagnosis of SARS-CoV-2  by FDA under an Emergency Use Authorization (EUA). This EUA will remain  in effect (meaning this test can be used) for the duration of the COVID-19 declaration under Section 564(b)(1) of the Act, 21 U.S.C.section 360bbb-3(b)(1), unless the authorization is terminated  or revoked sooner.       Influenza A by PCR NEGATIVE NEGATIVE Final   Influenza B by PCR NEGATIVE NEGATIVE Final    Comment: (NOTE) The Xpert Xpress SARS-CoV-2/FLU/RSV plus assay is intended as an aid in the diagnosis of influenza from Nasopharyngeal swab specimens and should not be used as a sole basis for treatment. Nasal washings and aspirates are unacceptable for Xpert Xpress SARS-CoV-2/FLU/RSV testing.  Fact Sheet for Patients: BloggerCourse.com  Fact Sheet for Healthcare  Providers: SeriousBroker.it  This test is not yet approved or cleared by the [place] FDA and has been authorized for detection and/or diagnosis of SARS-CoV-2 by FDA under an Emergency Use Authorization (EUA). This EUA will remain in effect (meaning this test can be used) for the duration of the COVID-19 declaration under Section 564(b)(1) of the Act, 21 U.S.C. section 360bbb-3(b)(1), unless the authorization is terminated or revoked.  Performed at [facility], [address] [place], [place] [address]   MRSA PCR Screening     Status: None   Collection Time: 10/30/20  1:11 AM   Specimen: Nasopharyngeal  Result Value Ref Range Status   MRSA by PCR NEGATIVE NEGATIVE Final    Comment:        The GeneXpert MRSA Assay (FDA approved for NASAL specimens only), is one component of a comprehensive MRSA colonization surveillance program. It is not intended to diagnose MRSA infection nor to guide or monitor treatment for MRSA infections. Performed at [facility], [address] [place], [place] [address]          Radiology Studies: DG Chest Portable 1 View  Result Date: 10/28/2020 CLINICAL DATA:  Dizziness and weakness, initial encounter EXAM: PORTABLE CHEST 1 VIEW COMPARISON:  10/17/2019 FINDINGS: Cardiac shadow is enlarged. Aortic calcifications are seen. Mild vascular congestion is noted with mild interstitial edema. No focal confluent infiltrate is seen. No bony abnormality is noted. IMPRESSION: CHF. Electronically Signed   By: Alcide Clever M.D.   On: 10/28/2020 16:38   ECHOCARDIOGRAM COMPLETE  Result Date: 10/29/2020    ECHOCARDIOGRAM REPORT   Patient Name:   [name] Date of Exam: 10/29/2020 Medical Rec #:  [id number]      Height:       60.0 in Accession #:    [id number]     Weight:       168.5 lb Date of Birth:  [birth year]     BSA:          1.735 m Patient Age:    88 years       BP:           143/68 mmHg Patient  Gender: F              HR:           62 bpm. Exam Location:  [facility] Procedure: 2D Echo, Color Doppler, Cardiac Doppler and Intracardiac            Opacification Agent Indications:     I50.31 CHF-Acute Diastolic  History:         Patient has prior history of Echocardiogram examinations. COPD;                  Risk Factors:Dyslipidemia  and Diabetes.  Sonographer:     Humphrey RollsJoan Heiss RDCS (AE) Referring Phys:  [id number] Vernetta HoneyJAN A MANSY Diagnosing Phys: Marcina MillardAlexander Paraschos MD  Sonographer Comments: Suboptimal apical window and no subcostal window. Image acquisition challenging due to patient body habitus and Image acquisition challenging due to COPD. IMPRESSIONS  1. Left ventricular ejection fraction, by estimation, is 60 to 65%. The left ventricle has normal function. The left ventricle has no regional wall motion abnormalities. Left ventricular diastolic parameters were normal.  2. Right ventricular systolic function is normal. The right ventricular size is normal.  3. The mitral valve is normal in structure. Mild mitral valve regurgitation. No evidence of mitral stenosis.  4. The aortic valve is normal in structure. Aortic valve regurgitation is not visualized. No aortic stenosis is present.  5. The inferior vena cava is normal in size with greater than 50% respiratory variability, suggesting right atrial pressure of 3 mmHg. FINDINGS  Left Ventricle: Left ventricular ejection fraction, by estimation, is 60 to 65%. The left ventricle has normal function. The left ventricle has no regional wall motion abnormalities. Definity contrast agent was given IV to delineate the left ventricular  endocardial borders. The left ventricular internal cavity size was normal in size. There is no left ventricular hypertrophy. Left ventricular diastolic parameters were normal. Right Ventricle: The right ventricular size is normal. No increase in right ventricular wall thickness. Right ventricular systolic function is normal. Left Atrium: Left atrial  size was normal in size. Right Atrium: Right atrial size was normal in size. Pericardium: There is no evidence of pericardial effusion. Mitral Valve: The mitral valve is normal in structure. Mild mitral valve regurgitation. No evidence of mitral valve stenosis. MV peak gradient, 7.6 mmHg. The mean mitral valve gradient is 2.0 mmHg. Tricuspid Valve: The tricuspid valve is normal in structure. Tricuspid valve regurgitation is mild . No evidence of tricuspid stenosis. Aortic Valve: The aortic valve is normal in structure. Aortic valve regurgitation is not visualized. No aortic stenosis is present. Aortic valve mean gradient measures 4.0 mmHg. Aortic valve peak gradient measures 8.0 mmHg. Aortic valve area, by VTI measures 1.80 cm. Pulmonic Valve: The pulmonic valve was normal in structure. Pulmonic valve regurgitation is not visualized. No evidence of pulmonic stenosis. Aorta: The aortic root is normal in size and structure. Venous: The inferior vena cava is normal in size with greater than 50% respiratory variability, suggesting right atrial pressure of 3 mmHg. IAS/Shunts: No atrial level shunt detected by color flow Doppler.  LEFT VENTRICLE PLAX 2D LVIDd:         3.60 cm  Diastology LVIDs:         2.20 cm  LV e' lateral:   6.31 cm/s LV PW:         1.24 cm  LV E/e' lateral: 20.1 LV IVS:        0.89 cm LVOT diam:     1.90 cm LV SV:         46 LV SV Index:   27 LVOT Area:     2.84 cm  RIGHT VENTRICLE RV Basal diam:  3.02 cm LEFT ATRIUM           Index       RIGHT ATRIUM           Index LA diam:      4.30 cm 2.48 cm/m  RA Area:     14.60 cm LA Vol (A4C): 94.5 ml 54.45 ml/m RA Volume:   36.00 ml  20.74 ml/m  AORTIC VALVE                   PULMONIC VALVE AV Area (Vmax):    1.91 cm    PV Vmax:       1.21 m/s AV Area (Vmean):   1.81 cm    PV Vmean:      83.700 cm/s AV Area (VTI):     1.80 cm    PV VTI:        0.236 m AV Vmax:           141.00 cm/s PV Peak grad:  5.9 mmHg AV Vmean:          93.500 cm/s PV Mean grad:   3.0 mmHg AV VTI:            0.258 m AV Peak Grad:      8.0 mmHg AV Mean Grad:      4.0 mmHg LVOT Vmax:         95.10 cm/s LVOT Vmean:        59.700 cm/s LVOT VTI:          0.164 m LVOT/AV VTI ratio: 0.64  AORTA Ao Root diam: 2.90 cm MITRAL VALVE MV Area (PHT): 2.12 cm     SHUNTS MV Peak grad:  7.6 mmHg     Systemic VTI:  0.16 m MV Mean grad:  2.0 mmHg     Systemic Diam: 1.90 cm MV Vmax:       1.38 m/s MV Vmean:      60.7 cm/s MV Decel Time: 358 msec MV E velocity: 127.00 cm/s MV A velocity: 51.40 cm/s MV E/A ratio:  2.47 Marcina Millard MD Electronically signed by Marcina Millard MD Signature Date/Time: 10/29/2020/1:13:25 PM    Final         Scheduled Meds: . amLODipine  5 mg Oral Daily  . aspirin EC  81 mg Oral Daily  . cholecalciferol  2,000 Units Oral Daily  . [START ON 10/31/2020] COVID-19 mRNA vaccine (Pfizer)  0.3 mL Intramuscular ONCE-1600  . dicyclomine  20 mg Oral QID  . divalproex  250 mg Oral QHS  . enoxaparin (LOVENOX) injection  40 mg Subcutaneous Q24H  . feeding supplement  237 mL Oral BID BM  . furosemide  40 mg Intravenous Q12H  . insulin aspart  0-15 Units Subcutaneous TID WC  . insulin aspart  0-5 Units Subcutaneous QHS  . ipratropium-albuterol  3 mL Nebulization BID  . loratadine  10 mg Oral Daily  . metoprolol tartrate  25 mg Oral BID  . multivitamin with minerals  1 tablet Oral Daily  . olopatadine  1 drop Both Eyes Daily  . pantoprazole  40 mg Oral Daily  . QUEtiapine  12.5 mg Oral Daily   Continuous Infusions:   LOS: 2 days    Time spent: 15 minutes    Tresa Moore, MD [facility] Pager [phone]  If 7PM-7AM, please contact night-coverage 10/30/2020, 10:19 AM

## 2020-10-30 NOTE — Progress Notes (Signed)
Mobility Specialist - Progress Note   10/30/20 1600  Mobility  Activity Ambulated in room  Level of Assistance Minimal assist, patient does 75% or more  Assistive Device Front wheel walker  Distance Ambulated (ft) 25 ft  Mobility Response Tolerated well  Mobility performed by Mobility specialist  $Mobility charge 1 Mobility    Pre-mobility: 61 HR, 94% SpO2 Post-mobility: 75 HR, 97% SpO2   Pt was lying in bed upon arrival utilizing room air. Pt agreed to session. Pt denied nausea or fatigue, but did c/o pain in R hip "10/10". Pt stated that pain in hip began about a week ago, however pt was not limited by mobility. Pt was able to get EOB with HOB elevated and minA. Mobility adjusted RW to meet pt's height requirements. Pt was able to stand to RW with minA, presenting with kyphotic posture. Pt ambulated 25' in room with no LOB noted. CGA for safety. Further mobility limited d/t fatigue and pt voicing need to rest. Pt states she has a man who is very supportive and helps her get around at home. O2 > 93% throughout session. Overall, pt tolerated session well. Pt was left in bed with all needs in reach and alarm set. Nurse notified.    Filiberto Pinks Mobility Specialist 10/30/20, 4:55 PM

## 2020-10-30 NOTE — Progress Notes (Signed)
[facility] Cardiology [facility] Encounter Note  Patient: [name] / Admit Date: 10/28/2020 / Date of Encounter: 10/30/2020, 11:05 AM   Subjective: 12/23.  Patient has had some slight improvements of oxygenation and shortness of breath since admission to the hospital.  The patient did have a chest x-ray showing congestive heart failure and does still have some bibasilar Rales with decreased breath sounds consistent with pleural effusion and pulmonary edema.  The patient has had some urine output which has improved her oxygenation.  Blood pressure has been relatively controlled and the patient has had no anginal symptoms or acute coronary syndrome.  Review of Systems: Positive for: Shortness of breath Negative for: Vision change, hearing change, syncope, dizziness, nausea, vomiting,diarrhea, bloody stool, stomach pain, cough, congestion, diaphoresis, urinary frequency, urinary pain,skin lesions, skin rashes Others previously listed  Objective: Telemetry: Normal sinus rhythm Physical Exam: Blood pressure (!) 135/42, pulse 71, temperature 98.4 F (36.9 C), temperature source Oral, resp. rate 16, height 5' (1.524 m), weight 69.3 kg, SpO2 95 %. Body mass index is 29.84 kg/m. General: Well developed, well nourished, in no acute distress. Head: Normocephalic, atraumatic, sclera non-icteric, no xanthomas, nares are without discharge. Neck: No apparent masses Lungs: Normal respirations with no wheezes, no rhonchi, bibasilar rales , some crackles decreased breath sounds of the base  Heart: Regular rate and rhythm, normal S1 S2, no murmur, no rub, no gallop, PMI is normal size and placement, carotid upstroke normal without bruit, jugular venous pressure normal Abdomen: Soft, non-tender, non-distended with normoactive bowel sounds. No hepatosplenomegaly. Abdominal aorta is normal size without bruit Extremities: Trace edema, no clubbing, no cyanosis, no ulcers,  Peripheral: 2+ radial, 2+  femoral, 2+ dorsal pedal pulses Neuro: Alert and oriented. Moves all extremities spontaneously. Psych:  Responds to questions appropriately with a normal affect.   Intake/Output Summary (Last 24 hours) at 10/30/2020 1105 Last data filed at 10/30/2020 0900 Gross per 24 hour  Intake 240 ml  Output 400 ml  Net -160 ml    Inpatient Medications:  . amLODipine  5 mg Oral Daily  . aspirin EC  81 mg Oral Daily  . cholecalciferol  2,000 Units Oral Daily  . [START ON 10/31/2020] COVID-19 mRNA vaccine (Pf[employer])  0.3 mL Intramuscular ONCE-1600  . dicyclomine  20 mg Oral QID  . divalproex  250 mg Oral QHS  . enoxaparin (LOVENOX) injection  40 mg Subcutaneous Q24H  . feeding supplement  237 mL Oral BID BM  . furosemide  40 mg Intravenous Q12H  . insulin aspart  0-15 Units Subcutaneous TID WC  . insulin aspart  0-5 Units Subcutaneous QHS  . ipratropium-albuterol  3 mL Nebulization BID  . loratadine  10 mg Oral Daily  . metoprolol tartrate  25 mg Oral BID  . multivitamin with minerals  1 tablet Oral Daily  . olopatadine  1 drop Both Eyes Daily  . pantoprazole  40 mg Oral Daily  . QUEtiapine  12.5 mg Oral Daily   Infusions:   Labs: Recent Labs    10/29/20 0328 10/30/20 0905  NA 144 141  K 4.6 4.5  CL 110 102  CO2 26 31  GLUCOSE 120* 131*  BUN 27* 21  CREATININE 0.82 0.68  CALCIUM 8.6* 9.1  MG  --  1.7   Recent Labs    10/28/20 1512  AST 188*  ALT 172*  ALKPHOS 138*  BILITOT 0.5  PROT 7.0  ALBUMIN 3.2*   Recent Labs    10/28/20 1512 10/29/20  0328  WBC 5.0 4.9  NEUTROABS 2.8  --   HGB 10.6* 10.7*  HCT 36.4 37.3  MCV 85.6 87.6  PLT 220 180   No results for input(s): CKTOTAL, CKMB, TROPONINI in the last 72 hours. Invalid input(s): POCBNP Recent Labs    10/29/20 0328  HGBA1C 6.4*     Weights: Filed Weights   10/28/20 1454 10/30/20 0428  Weight: 76.4 kg 69.3 kg     Radiology/Studies:  CT Head Wo Contrast  Result Date: 10/09/2020 CLINICAL DATA:  Fall,  head injury EXAM: CT HEAD WITHOUT CONTRAST CT CERVICAL SPINE WITHOUT CONTRAST TECHNIQUE: Multidetector CT imaging of the head and cervical spine was performed following the standard protocol without intravenous contrast. Multiplanar CT image reconstructions of the cervical spine were also generated. COMPARISON:  CT head 06/06/2019 FINDINGS: CT HEAD FINDINGS Brain: Normal anatomic configuration. Parenchymal volume loss is commensurate with the patient's age. Moderate periventricular white matter changes are present likely reflecting the sequela of small vessel ischemia. Remote lacunar infarcts are noted within the a left corona radiata, right putamen, and medial right cerebellar hemisphere. Right putaminal infarct is new since prior examination. No abnormal intra or extra-axial mass lesion or fluid collection. No abnormal mass effect or midline shift. No evidence of acute intracranial hemorrhage or infarct. Ventricular size is normal. Cerebellum unremarkable. Vascular: No asymmetric hyperdense vasculature at the skull base. Skull: Intact Sinuses/Orbits: Paranasal sinuses are clear. Orbits are unremarkable. Other: There is fluid opacification of a few inferior left mastoid air cells, nonspecific. Mastoid air cells and middle ear cavities are otherwise clear. Small occipital scalp hematoma noted. CT CERVICAL SPINE FINDINGS Assessment of the cervical spine is moderately limited by motion artifact Alignment: Normal cervical lordosis.  No listhesis. Skull base and vertebrae: The craniocervical junction is unremarkable. The atlantodental interval is normal. No acute fracture of the cervical spine. Soft tissues and spinal canal: No prevertebral fluid or swelling. No visible canal hematoma. Disc levels: Review of the sagittal reformats demonstrates preservation of cervical lordosis. A remote compression fracture of C6 is identified with approximately 50% loss of height. Remaining vertebral body height has been preserved.  There is intervertebral disc space narrowing and endplate remodeling at C6-7 with associated vacuum disc phenomena in keeping with changes of severe degenerative disc disease. Milder degenerative changes are noted at C5-6. Review of the a axial images demonstrates multilevel moderate to severe uncovertebral and facet arthrosis resulting in multilevel moderate to severe neural foraminal narrowing throughout the cervical spine and mild central canal stenosis at C3-4 and C5-6. Upper chest: Unremarkable Other: There is asymmetric enlargement and heterogeneous attenuation of the left thyroid lobe IMPRESSION: No acute intracranial injury. No calvarial fracture. Small occipital scalp hematoma. Multiple remote infarcts with interval development of a right putaminal infarct since prior examination of 06/06/2019. No acute fracture or listhesis of the cervical spine, allowing for moderate limitation by motion artifact. Multilevel severe uncovertebral and facet arthrosis resulting in multilevel moderate to severe neural foraminal narrowing. Remote C6 compression fracture with approximately 50% loss of height. Asymmetric enlargement of the left thyroid lobe. This could be better assessed with dedicated thyroid sonography if indicated. Electronically Signed   By: Helyn Numbers MD   On: 10/09/2020 00:25   CT Cervical Spine Wo Contrast  Result Date: 10/09/2020 CLINICAL DATA:  Fall, head injury EXAM: CT HEAD WITHOUT CONTRAST CT CERVICAL SPINE WITHOUT CONTRAST TECHNIQUE: Multidetector CT imaging of the head and cervical spine was performed following the standard protocol without intravenous contrast. Multiplanar  CT image reconstructions of the cervical spine were also generated. COMPARISON:  CT head 06/06/2019 FINDINGS: CT HEAD FINDINGS Brain: Normal anatomic configuration. Parenchymal volume loss is commensurate with the patient's age. Moderate periventricular white matter changes are present likely reflecting the sequela of  small vessel ischemia. Remote lacunar infarcts are noted within the a left corona radiata, right putamen, and medial right cerebellar hemisphere. Right putaminal infarct is new since prior examination. No abnormal intra or extra-axial mass lesion or fluid collection. No abnormal mass effect or midline shift. No evidence of acute intracranial hemorrhage or infarct. Ventricular size is normal. Cerebellum unremarkable. Vascular: No asymmetric hyperdense vasculature at the skull base. Skull: Intact Sinuses/Orbits: Paranasal sinuses are clear. Orbits are unremarkable. Other: There is fluid opacification of a few inferior left mastoid air cells, nonspecific. Mastoid air cells and middle ear cavities are otherwise clear. Small occipital scalp hematoma noted. CT CERVICAL SPINE FINDINGS Assessment of the cervical spine is moderately limited by motion artifact Alignment: Normal cervical lordosis.  No listhesis. Skull base and vertebrae: The craniocervical junction is unremarkable. The atlantodental interval is normal. No acute fracture of the cervical spine. Soft tissues and spinal canal: No prevertebral fluid or swelling. No visible canal hematoma. Disc levels: Review of the sagittal reformats demonstrates preservation of cervical lordosis. A remote compression fracture of C6 is identified with approximately 50% loss of height. Remaining vertebral body height has been preserved. There is intervertebral disc space narrowing and endplate remodeling at C6-7 with associated vacuum disc phenomena in keeping with changes of severe degenerative disc disease. Milder degenerative changes are noted at C5-6. Review of the a axial images demonstrates multilevel moderate to severe uncovertebral and facet arthrosis resulting in multilevel moderate to severe neural foraminal narrowing throughout the cervical spine and mild central canal stenosis at C3-4 and C5-6. Upper chest: Unremarkable Other: There is asymmetric enlargement and  heterogeneous attenuation of the left thyroid lobe IMPRESSION: No acute intracranial injury. No calvarial fracture. Small occipital scalp hematoma. Multiple remote infarcts with interval development of a right putaminal infarct since prior examination of 06/06/2019. No acute fracture or listhesis of the cervical spine, allowing for moderate limitation by motion artifact. Multilevel severe uncovertebral and facet arthrosis resulting in multilevel moderate to severe neural foraminal narrowing. Remote C6 compression fracture with approximately 50% loss of height. Asymmetric enlargement of the left thyroid lobe. This could be better assessed with dedicated thyroid sonography if indicated. Electronically Signed   By: Helyn NumbersAshesh  Parikh MD   On: 10/09/2020 00:25   DG Chest Portable 1 View  Result Date: 10/28/2020 CLINICAL DATA:  Dizziness and weakness, initial encounter EXAM: PORTABLE CHEST 1 VIEW COMPARISON:  10/17/2019 FINDINGS: Cardiac shadow is enlarged. Aortic calcifications are seen. Mild vascular congestion is noted with mild interstitial edema. No focal confluent infiltrate is seen. No bony abnormality is noted. IMPRESSION: CHF. Electronically Signed   By: Alcide CleverMark  Lukens M.D.   On: 10/28/2020 16:38   ECHOCARDIOGRAM COMPLETE  Result Date: 10/29/2020    ECHOCARDIOGRAM REPORT   Patient Name:   Jessica ForsterVA NELL Holston Date of Exam: 10/29/2020 Medical Rec #:  [id number]      Height:       60.0 in Accession #:    [id number]     Weight:       168.5 lb Date of Birth:  01-25-1932     BSA:          1.735 m Patient Age:    [age] years  BP:           143/68 mmHg Patient Gender: F              HR:           62 bpm. Exam Location:  [facility] Procedure: 2D Echo, Color Doppler, Cardiac Doppler and Intracardiac            Opacification Agent Indications:     I50.31 CHF-Acute Diastolic  History:         Patient has prior history of Echocardiogram examinations. COPD;                  Risk Factors:Dyslipidemia and Diabetes.  Sonographer:      Humphrey Rolls RDCS (AE) Referring Phys:  [id number] Vernetta Honey MANSY Diagnosing Phys: Marcina Millard MD  Sonographer Comments: Suboptimal apical window and no subcostal window. Image acquisition challenging due to patient body habitus and Image acquisition challenging due to COPD. IMPRESSIONS  1. Left ventricular ejection fraction, by estimation, is 60 to 65%. The left ventricle has normal function. The left ventricle has no regional wall motion abnormalities. Left ventricular diastolic parameters were normal.  2. Right ventricular systolic function is normal. The right ventricular size is normal.  3. The mitral valve is normal in structure. Mild mitral valve regurgitation. No evidence of mitral stenosis.  4. The aortic valve is normal in structure. Aortic valve regurgitation is not visualized. No aortic stenosis is present.  5. The inferior vena cava is normal in size with greater than 50% respiratory variability, suggesting right atrial pressure of 3 mmHg. FINDINGS  Left Ventricle: Left ventricular ejection fraction, by estimation, is 60 to 65%. The left ventricle has normal function. The left ventricle has no regional wall motion abnormalities. Definity contrast agent was given IV to delineate the left ventricular  endocardial borders. The left ventricular internal cavity size was normal in size. There is no left ventricular hypertrophy. Left ventricular diastolic parameters were normal. Right Ventricle: The right ventricular size is normal. No increase in right ventricular wall thickness. Right ventricular systolic function is normal. Left Atrium: Left atrial size was normal in size. Right Atrium: Right atrial size was normal in size. Pericardium: There is no evidence of pericardial effusion. Mitral Valve: The mitral valve is normal in structure. Mild mitral valve regurgitation. No evidence of mitral valve stenosis. MV peak gradient, 7.6 mmHg. The mean mitral valve gradient is 2.0 mmHg. Tricuspid Valve: The tricuspid  valve is normal in structure. Tricuspid valve regurgitation is mild . No evidence of tricuspid stenosis. Aortic Valve: The aortic valve is normal in structure. Aortic valve regurgitation is not visualized. No aortic stenosis is present. Aortic valve mean gradient measures 4.0 mmHg. Aortic valve peak gradient measures 8.0 mmHg. Aortic valve area, by VTI measures 1.80 cm. Pulmonic Valve: The pulmonic valve was normal in structure. Pulmonic valve regurgitation is not visualized. No evidence of pulmonic stenosis. Aorta: The aortic root is normal in size and structure. Venous: The inferior vena cava is normal in size with greater than 50% respiratory variability, suggesting right atrial pressure of 3 mmHg. IAS/Shunts: No atrial level shunt detected by color flow Doppler.  LEFT VENTRICLE PLAX 2D LVIDd:         3.60 cm  Diastology LVIDs:         2.20 cm  LV e' lateral:   6.31 cm/s LV PW:         1.24 cm  LV E/e' lateral: 20.1 LV IVS:  0.89 cm LVOT diam:     1.90 cm LV SV:         46 LV SV Index:   27 LVOT Area:     2.84 cm  RIGHT VENTRICLE RV Basal diam:  3.02 cm LEFT ATRIUM           Index       RIGHT ATRIUM           Index LA diam:      4.30 cm 2.48 cm/m  RA Area:     14.60 cm LA Vol (A4C): 94.5 ml 54.45 ml/m RA Volume:   36.00 ml  20.74 ml/m  AORTIC VALVE                   PULMONIC VALVE AV Area (Vmax):    1.91 cm    PV Vmax:       1.21 m/s AV Area (Vmean):   1.81 cm    PV Vmean:      83.700 cm/s AV Area (VTI):     1.80 cm    PV VTI:        0.236 m AV Vmax:           141.00 cm/s PV Peak grad:  5.9 mmHg AV Vmean:          93.500 cm/s PV Mean grad:  3.0 mmHg AV VTI:            0.258 m AV Peak Grad:      8.0 mmHg AV Mean Grad:      4.0 mmHg LVOT Vmax:         95.10 cm/s LVOT Vmean:        59.700 cm/s LVOT VTI:          0.164 m LVOT/AV VTI ratio: 0.64  AORTA Ao Root diam: 2.90 cm MITRAL VALVE MV Area (PHT): 2.12 cm     SHUNTS MV Peak grad:  7.6 mmHg     Systemic VTI:  0.16 m MV Mean grad:  2.0 mmHg      Systemic Diam: 1.90 cm MV Vmax:       1.38 m/s MV Vmean:      60.7 cm/s MV Decel Time: 358 msec MV E velocity: 127.00 cm/s MV A velocity: 51.40 cm/s MV E/A ratio:  2.47 Marcina Millard MD Electronically signed by Marcina Millard MD Signature Date/Time: 10/29/2020/1:13:25 PM    Final      Assessment and Recommendation  86 y.o. female with known diabetes hypertension hyperlipidemia and abnormal EKG with acute diastolic dysfunction congestive heart failure without evidence of myocardial infarction and/or acute coronary syndrome 1.  Continue intravenous Lasix which appears to be working fairly well with treatment of acute on chronic diastolic dysfunction congestive heart failure 2.  No further cardiac diagnostics necessary at this time due to echocardiogram showing normal LV systolic function with no evidence of significant valvular heart disease 3.  Hypertension control with amlodipine metoprolol combination without change today 4.  Further supportive care other concerns and would continue to follow with ambulation for potential for discharge home  Signed, Arnoldo Hooker M.D. FACC

## 2020-10-30 NOTE — Discharge Summary (Addendum)
Physician Discharge Summary  Nabiha Planck ZOX:[id number] DOB: 09-11-1932 DOA: 10/28/2020  PCP: Lauro Regulus, MD  Admit date: 10/28/2020 Discharge date: 11/03/2020  Admitted From: ALF Disposition:  ALF  Recommendations for Outpatient Follow-up:  1. Follow up with PCP in 1-2 weeks 2. Follow up with cardiology as directed  Home Health:Yes Equipment/Devices:None Discharge Condition:Stable CODE STATUS:DNR Diet recommendation: Heart Healthy  Brief/Interim Summary: 86 y.o.Caucasian femalewith a known history of COPD, anxiety, depression, type 2 diabetes mellitus, GERD and dyslipidemia who presented to the emergency room with acute onset of dizziness with generalized weakness that started today.She admits to dyspnea on exertion as well as orthopnea and paroxysmal nocturnal dyspnea. No chest pain or palpitations. No current nausea or vomiting or abdominal pain. No dysuria, oliguria or hematuria or flank pain.  12/22: Started on intravenous Lasix for acute decompensated heart failure suspected.  Symptomatically improved.  No pain complaints on my evaluation today.  Cardiology evaluation appreciated.  12/23: Patient symptomatically improved.  Lost IV access last night.  Now agreeable to replacement.  Net -1 L  12/24: Diuresed effectively.  Back to dry weight.  Stable for discharge back to assisted living  12/27: Patient remained stable over weekend.  Off oxygen.  Stable to return to ALF.  FU with cardiology 1 week.  Increase home lasix dose to 40mg  qd  Discharge Diagnoses:  Active Problems:   Acute CHF (congestive heart failure) (HCC)   Acute decompensated heart failure (HCC)  Acute on chronic diastolic congestive heart failure Acute hypoxic respiratory failure, secondary to above.  Resolved Patient started on IV Lasix with improvement in volume status Weaned off supplemental oxygen Cardiology recommendations appreciated Echocardiogram with grade 1 diastolic  dysfunction and normal ejection fraction Diuresed with IV Lasix in house Increase home lasix to 40mg  qd FU with cardiology 1 week  Essential hypertension Continue Norvasc Continue beta-blockade  Type 2 diabetes mellitus Can resume oral antihyperglycemic's on discharge  Anxiety Depression Continue home regimen of Zoloft, Depakote, Seroquel  Discharge Instructions    Allergies as of 11/03/2020      Reactions   Ampicillin    Zpac   Sulfa Antibiotics       Medication List    TAKE these medications   acetaminophen 500 MG tablet Commonly known as: TYLENOL Take 1,000 mg by mouth 3 (three) times daily.   alum & mag hydroxide-simeth 200-200-20 MG/5ML suspension Commonly known as: MAALOX/MYLANTA Take 30 mLs by mouth as needed for indigestion or heartburn.   amLODipine 5 MG tablet Commonly known as: NORVASC Take 5 mg by mouth daily.   aspirin EC 81 MG tablet Take 81 mg by mouth daily.   bismuth subsalicylate 262 MG/15ML suspension Commonly known as: PEPTO BISMOL Take 262 mg by mouth every 2 (two) hours as needed for indigestion (up to 6 doses in 24 hr).   calcium carbonate 500 MG chewable tablet Commonly known as: TUMS - dosed in mg elemental calcium Chew 2 tablets (400 mg of elemental calcium total) by mouth 3 (three) times daily as needed for indigestion or heartburn.   dicyclomine 20 MG tablet Commonly known as: BENTYL Take 20 mg by mouth 4 (four) times daily.   divalproex 250 MG 24 hr tablet Commonly known as: DEPAKOTE ER Take 250 mg by mouth at bedtime.   esomeprazole 20 MG capsule Commonly known as: NEXIUM Take 20 mg by mouth daily.   feeding supplement Liqd Take 237 mLs by mouth 2 (two) times daily between meals.   furosemide 40  MG tablet Commonly known as: LASIX Take 1 tablet (40 mg total) by mouth daily. What changed:   medication strength  how much to take   hydrocortisone cream 1 % Apply 1 application topically every 6 (six) hours as  needed for itching.   insulin aspart 100 UNIT/ML injection Commonly known as: novoLOG 0-15 Units, Subcutaneous, 3 times daily with meals, CBG < 70: Implement Hypoglycemia measures CBG 70 - 120: 0 units CBG 121 - 150: 2 units CBG 151 - 200: 3 units CBG 201 - 250: 5 units CBG 251 - 300: 8 units CBG 301 - 350: 11 units CBG 351 - 400: 15 units CBG > 400: call MD   ipratropium-albuterol 0.5-2.5 (3) MG/3ML Soln Commonly known as: DUONEB Take 3 mLs by nebulization 2 (two) times daily.   ketotifen 0.025 % ophthalmic solution Commonly known as: ZADITOR Place 1 drop into both eyes 2 (two) times daily.   loperamide 2 MG tablet Commonly known as: IMODIUM A-D Take 2 mg by mouth as needed for diarrhea or loose stools (up to 4 doses in 12 hrs.).   loratadine 10 MG tablet Commonly known as: CLARITIN Take 10 mg by mouth daily.   magnesium hydroxide 400 MG/5ML suspension Commonly known as: MILK OF MAGNESIA Take 30 mLs by mouth daily as needed for mild constipation.   metFORMIN 500 MG 24 hr tablet Commonly known as: GLUCOPHAGE-XR Take 500 mg by mouth 2 (two) times a day.   metoprolol tartrate 25 MG tablet Commonly known as: LOPRESSOR Take 1 tablet (25 mg total) by mouth 2 (two) times daily.   multivitamin with minerals Tabs tablet Take 1 tablet by mouth daily.   nystatin powder Commonly known as: MYCOSTATIN/NYSTOP Apply 1 g topically 2 (two) times daily as needed (to affected area(s)).   ofloxacin 0.3 % ophthalmic solution Commonly known as: OCUFLOX Place 1 drop into both eyes 4 (four) times daily.   olopatadine 0.1 % ophthalmic solution Commonly known as: PATANOL Place 1 drop into both eyes daily.   ondansetron 4 MG tablet Commonly known as: ZOFRAN Take 4 mg by mouth every 8 (eight) hours as needed for nausea or vomiting.   psyllium 58.6 % powder Commonly known as: METAMUCIL Take 1 packet by mouth 2 (two) times a day.   QUEtiapine 25 MG tablet Commonly known as:  SEROQUEL Take 1 tablet (25 mg total) by mouth at bedtime as needed (psychosis, insomnia). What changed: when to take this   ROBITUSSIN CF PO Take 10 mLs by mouth every 6 (six) hours as needed (cough).   sertraline 50 MG tablet Commonly known as: ZOLOFT Take 50 mg by mouth daily.   sodium phosphate Pediatric 3.5-9.5 GM/59ML enema Place 1 enema rectally once as needed for severe constipation.   Vitamin D 50 MCG (2000 UT) tablet Take 2,000 Units by mouth daily.         Follow-up Information    [facility] Follow up on 11/11/2020.   Specialty: Cardiology Why: at 11:30am. Enter through the [facility] entrance Contact information: [address] [place] [address] [phone]             Allergies  Allergen Reactions  . Ampicillin     Zpac  . Sulfa Antibiotics     Consultations:  Cardiology-[facility]   Procedures/Studies: CT Head Wo Contrast  Result Date: 10/09/2020 CLINICAL DATA:  Fall, head injury EXAM: CT HEAD WITHOUT CONTRAST CT CERVICAL SPINE WITHOUT  CONTRAST TECHNIQUE: Multidetector CT imaging of the head and cervical spine was performed following the standard protocol without intravenous contrast. Multiplanar CT image reconstructions of the cervical spine were also generated. COMPARISON:  CT head 06/06/2019 FINDINGS: CT HEAD FINDINGS Brain: Normal anatomic configuration. Parenchymal volume loss is commensurate with the patient's age. Moderate periventricular white matter changes are present likely reflecting the sequela of small vessel ischemia. Remote lacunar infarcts are noted within the a left corona radiata, right putamen, and medial right cerebellar hemisphere. Right putaminal infarct is new since prior examination. No abnormal intra or extra-axial mass lesion or fluid collection. No abnormal mass effect or midline shift. No evidence of acute intracranial hemorrhage or infarct.  Ventricular size is normal. Cerebellum unremarkable. Vascular: No asymmetric hyperdense vasculature at the skull base. Skull: Intact Sinuses/Orbits: Paranasal sinuses are clear. Orbits are unremarkable. Other: There is fluid opacification of a few inferior left mastoid air cells, nonspecific. Mastoid air cells and middle ear cavities are otherwise clear. Small occipital scalp hematoma noted. CT CERVICAL SPINE FINDINGS Assessment of the cervical spine is moderately limited by motion artifact Alignment: Normal cervical lordosis.  No listhesis. Skull base and vertebrae: The craniocervical junction is unremarkable. The atlantodental interval is normal. No acute fracture of the cervical spine. Soft tissues and spinal canal: No prevertebral fluid or swelling. No visible canal hematoma. Disc levels: Review of the sagittal reformats demonstrates preservation of cervical lordosis. A remote compression fracture of C6 is identified with approximately 50% loss of height. Remaining vertebral body height has been preserved. There is intervertebral disc space narrowing and endplate remodeling at C6-7 with associated vacuum disc phenomena in keeping with changes of severe degenerative disc disease. Milder degenerative changes are noted at C5-6. Review of the a axial images demonstrates multilevel moderate to severe uncovertebral and facet arthrosis resulting in multilevel moderate to severe neural foraminal narrowing throughout the cervical spine and mild central canal stenosis at C3-4 and C5-6. Upper chest: Unremarkable Other: There is asymmetric enlargement and heterogeneous attenuation of the left thyroid lobe IMPRESSION: No acute intracranial injury. No calvarial fracture. Small occipital scalp hematoma. Multiple remote infarcts with interval development of a right putaminal infarct since prior examination of 06/06/2019. No acute fracture or listhesis of the cervical spine, allowing for moderate limitation by motion artifact.  Multilevel severe uncovertebral and facet arthrosis resulting in multilevel moderate to severe neural foraminal narrowing. Remote C6 compression fracture with approximately 50% loss of height. Asymmetric enlargement of the left thyroid lobe. This could be better assessed with dedicated thyroid sonography if indicated. Electronically Signed   By: Helyn Numbers MD   On: 10/09/2020 00:25   CT Cervical Spine Wo Contrast  Result Date: 10/09/2020 CLINICAL DATA:  Fall, head injury EXAM: CT HEAD WITHOUT CONTRAST CT CERVICAL SPINE WITHOUT CONTRAST TECHNIQUE: Multidetector CT imaging of the head and cervical spine was performed following the standard protocol without intravenous contrast. Multiplanar CT image reconstructions of the cervical spine were also generated. COMPARISON:  CT head 06/06/2019 FINDINGS: CT HEAD FINDINGS Brain: Normal anatomic configuration. Parenchymal volume loss is commensurate with the patient's age. Moderate periventricular white matter changes are present likely reflecting the sequela of small vessel ischemia. Remote lacunar infarcts are noted within the a left corona radiata, right putamen, and medial right cerebellar hemisphere. Right putaminal infarct is new since prior examination. No abnormal intra or extra-axial mass lesion or fluid collection. No abnormal mass effect or midline shift. No evidence of acute intracranial hemorrhage or infarct. Ventricular size is normal.  Cerebellum unremarkable. Vascular: No asymmetric hyperdense vasculature at the skull base. Skull: Intact Sinuses/Orbits: Paranasal sinuses are clear. Orbits are unremarkable. Other: There is fluid opacification of a few inferior left mastoid air cells, nonspecific. Mastoid air cells and middle ear cavities are otherwise clear. Small occipital scalp hematoma noted. CT CERVICAL SPINE FINDINGS Assessment of the cervical spine is moderately limited by motion artifact Alignment: Normal cervical lordosis.  No listhesis. Skull  base and vertebrae: The craniocervical junction is unremarkable. The atlantodental interval is normal. No acute fracture of the cervical spine. Soft tissues and spinal canal: No prevertebral fluid or swelling. No visible canal hematoma. Disc levels: Review of the sagittal reformats demonstrates preservation of cervical lordosis. A remote compression fracture of C6 is identified with approximately 50% loss of height. Remaining vertebral body height has been preserved. There is intervertebral disc space narrowing and endplate remodeling at C6-7 with associated vacuum disc phenomena in keeping with changes of severe degenerative disc disease. Milder degenerative changes are noted at C5-6. Review of the a axial images demonstrates multilevel moderate to severe uncovertebral and facet arthrosis resulting in multilevel moderate to severe neural foraminal narrowing throughout the cervical spine and mild central canal stenosis at C3-4 and C5-6. Upper chest: Unremarkable Other: There is asymmetric enlargement and heterogeneous attenuation of the left thyroid lobe IMPRESSION: No acute intracranial injury. No calvarial fracture. Small occipital scalp hematoma. Multiple remote infarcts with interval development of a right putaminal infarct since prior examination of 06/06/2019. No acute fracture or listhesis of the cervical spine, allowing for moderate limitation by motion artifact. Multilevel severe uncovertebral and facet arthrosis resulting in multilevel moderate to severe neural foraminal narrowing. Remote C6 compression fracture with approximately 50% loss of height. Asymmetric enlargement of the left thyroid lobe. This could be better assessed with dedicated thyroid sonography if indicated. Electronically Signed   By: Helyn NumbersAshesh  Parikh MD   On: 10/09/2020 00:25   DG Chest Portable 1 View  Result Date: 10/28/2020 CLINICAL DATA:  Dizziness and weakness, initial encounter EXAM: PORTABLE CHEST 1 VIEW COMPARISON:  10/17/2019  FINDINGS: Cardiac shadow is enlarged. Aortic calcifications are seen. Mild vascular congestion is noted with mild interstitial edema. No focal confluent infiltrate is seen. No bony abnormality is noted. IMPRESSION: CHF. Electronically Signed   By: Alcide CleverMark  Lukens M.D.   On: 10/28/2020 16:38   ECHOCARDIOGRAM COMPLETE  Result Date: 10/29/2020    ECHOCARDIOGRAM REPORT   Patient Name:   Wyn ForsterVA NELL Hasz Date of Exam: 10/29/2020 Medical Rec #:  [id number]      Height:       60.0 in Accession #:    [id number]     Weight:       168.5 lb Date of Birth:  May 05, 1932     BSA:          1.735 m Patient Age:    86 years       BP:           143/68 mmHg Patient Gender: F              HR:           62 bpm. Exam Location:  [facility] Procedure: 2D Echo, Color Doppler, Cardiac Doppler and Intracardiac            Opacification Agent Indications:     I50.31 CHF-Acute Diastolic  History:         Patient has prior history of Echocardiogram examinations. COPD;  Risk Factors:Dyslipidemia and Diabetes.  Sonographer:     Humphrey Rolls RDCS (AE) Referring Phys:  [id number] Vernetta Honey MANSY Diagnosing Phys: Marcina Millard MD  Sonographer Comments: Suboptimal apical window and no subcostal window. Image acquisition challenging due to patient body habitus and Image acquisition challenging due to COPD. IMPRESSIONS  1. Left ventricular ejection fraction, by estimation, is 60 to 65%. The left ventricle has normal function. The left ventricle has no regional wall motion abnormalities. Left ventricular diastolic parameters were normal.  2. Right ventricular systolic function is normal. The right ventricular size is normal.  3. The mitral valve is normal in structure. Mild mitral valve regurgitation. No evidence of mitral stenosis.  4. The aortic valve is normal in structure. Aortic valve regurgitation is not visualized. No aortic stenosis is present.  5. The inferior vena cava is normal in size with greater than 50% respiratory variability,  suggesting right atrial pressure of 3 mmHg. FINDINGS  Left Ventricle: Left ventricular ejection fraction, by estimation, is 60 to 65%. The left ventricle has normal function. The left ventricle has no regional wall motion abnormalities. Definity contrast agent was given IV to delineate the left ventricular  endocardial borders. The left ventricular internal cavity size was normal in size. There is no left ventricular hypertrophy. Left ventricular diastolic parameters were normal. Right Ventricle: The right ventricular size is normal. No increase in right ventricular wall thickness. Right ventricular systolic function is normal. Left Atrium: Left atrial size was normal in size. Right Atrium: Right atrial size was normal in size. Pericardium: There is no evidence of pericardial effusion. Mitral Valve: The mitral valve is normal in structure. Mild mitral valve regurgitation. No evidence of mitral valve stenosis. MV peak gradient, 7.6 mmHg. The mean mitral valve gradient is 2.0 mmHg. Tricuspid Valve: The tricuspid valve is normal in structure. Tricuspid valve regurgitation is mild . No evidence of tricuspid stenosis. Aortic Valve: The aortic valve is normal in structure. Aortic valve regurgitation is not visualized. No aortic stenosis is present. Aortic valve mean gradient measures 4.0 mmHg. Aortic valve peak gradient measures 8.0 mmHg. Aortic valve area, by VTI measures 1.80 cm. Pulmonic Valve: The pulmonic valve was normal in structure. Pulmonic valve regurgitation is not visualized. No evidence of pulmonic stenosis. Aorta: The aortic root is normal in size and structure. Venous: The inferior vena cava is normal in size with greater than 50% respiratory variability, suggesting right atrial pressure of 3 mmHg. IAS/Shunts: No atrial level shunt detected by color flow Doppler.  LEFT VENTRICLE PLAX 2D LVIDd:         3.60 cm  Diastology LVIDs:         2.20 cm  LV e' lateral:   6.31 cm/s LV PW:         1.24 cm  LV E/e'  lateral: 20.1 LV IVS:        0.89 cm LVOT diam:     1.90 cm LV SV:         46 LV SV Index:   27 LVOT Area:     2.84 cm  RIGHT VENTRICLE RV Basal diam:  3.02 cm LEFT ATRIUM           Index       RIGHT ATRIUM           Index LA diam:      4.30 cm 2.48 cm/m  RA Area:     14.60 cm LA Vol (A4C): 94.5 ml 54.45 ml/m RA Volume:  36.00 ml  20.74 ml/m  AORTIC VALVE                   PULMONIC VALVE AV Area (Vmax):    1.91 cm    PV Vmax:       1.21 m/s AV Area (Vmean):   1.81 cm    PV Vmean:      83.700 cm/s AV Area (VTI):     1.80 cm    PV VTI:        0.236 m AV Vmax:           141.00 cm/s PV Peak grad:  5.9 mmHg AV Vmean:          93.500 cm/s PV Mean grad:  3.0 mmHg AV VTI:            0.258 m AV Peak Grad:      8.0 mmHg AV Mean Grad:      4.0 mmHg LVOT Vmax:         95.10 cm/s LVOT Vmean:        59.700 cm/s LVOT VTI:          0.164 m LVOT/AV VTI ratio: 0.64  AORTA Ao Root diam: 2.90 cm MITRAL VALVE MV Area (PHT): 2.12 cm     SHUNTS MV Peak grad:  7.6 mmHg     Systemic VTI:  0.16 m MV Mean grad:  2.0 mmHg     Systemic Diam: 1.90 cm MV Vmax:       1.38 m/s MV Vmean:      60.7 cm/s MV Decel Time: 358 msec MV E velocity: 127.00 cm/s MV A velocity: 51.40 cm/s MV E/A ratio:  2.47 Marcina Millard MD Electronically signed by Marcina Millard MD Signature Date/Time: 10/29/2020/1:13:25 PM    Final     (Echo, Carotid, EGD, Colonoscopy, ERCP)    Subjective: Seen and examined.  No complaints.  Keep dry weight.  Stable for discharge  Discharge Exam: Vitals:   10/30/20 1013 10/30/20 1158  BP: (!) 135/42 (!) 114/36  Pulse: 71 (!) 57  Resp: 16 16  Temp: 98.4 F (36.9 C) 98 F (36.7 C)  SpO2: 95% 97%   Vitals:   10/30/20 0428 10/30/20 0430 10/30/20 1013 10/30/20 1158  BP:  (!) 158/68 (!) 135/42 (!) 114/36  Pulse:  71 71 (!) 57  Resp:  18 16 16   Temp:  97.8 F (36.6 C) 98.4 F (36.9 C) 98 F (36.7 C)  TempSrc:  Oral Oral Oral  SpO2:  95% 95% 97%  Weight: 69.3 kg     Height:        General: Pt  is alert, awake, not in acute distress Cardiovascular: RRR, S1/S2 +, no rubs, no gallops Respiratory: CTA bilaterally, no wheezing, no rhonchi Abdominal: Soft, NT, ND, bowel sounds + Extremities: no edema, no cyanosis    The results of significant diagnostics from this hospitalization (including imaging, microbiology, ancillary and laboratory) are listed below for reference.     Microbiology: Recent Results (from the past 240 hour(s))  Resp Panel by RT-PCR (Flu A&B, Covid) Nasopharyngeal Swab     Status: None   Collection Time: 10/28/20  3:12 PM   Specimen: Nasopharyngeal Swab; Nasopharyngeal(NP) swabs in vial transport medium  Result Value Ref Range Status   SARS Coronavirus 2 by RT PCR NEGATIVE NEGATIVE Final    Comment: (NOTE) SARS-CoV-2 target nucleic acids are NOT DETECTED.  The SARS-CoV-2 RNA is generally detectable in upper respiratory specimens during  the acute phase of infection. The lowest concentration of SARS-CoV-2 viral copies this assay can detect is 138 copies/mL. A negative result does not preclude SARS-Cov-2 infection and should not be used as the sole basis for treatment or other patient management decisions. A negative result may occur with  improper specimen collection/handling, submission of specimen other than nasopharyngeal swab, presence of viral mutation(s) within the areas targeted by this assay, and inadequate number of viral copies(<138 copies/mL). A negative result must be combined with clinical observations, patient history, and epidemiological information. The expected result is Negative.  Fact Sheet for Patients:  BloggerCourse.com  Fact Sheet for Healthcare Providers:  SeriousBroker.it  This test is no t yet approved or cleared by the [place] FDA and  has been authorized for detection and/or diagnosis of SARS-CoV-2 by FDA under an Emergency Use Authorization (EUA). This EUA will remain   in effect (meaning this test can be used) for the duration of the COVID-19 declaration under Section 564(b)(1) of the Act, 21 U.S.C.section 360bbb-3(b)(1), unless the authorization is terminated  or revoked sooner.       Influenza A by PCR NEGATIVE NEGATIVE Final   Influenza B by PCR NEGATIVE NEGATIVE Final    Comment: (NOTE) The Xpert Xpress SARS-CoV-2/FLU/RSV plus assay is intended as an aid in the diagnosis of influenza from Nasopharyngeal swab specimens and should not be used as a sole basis for treatment. Nasal washings and aspirates are unacceptable for Xpert Xpress SARS-CoV-2/FLU/RSV testing.  Fact Sheet for Patients: BloggerCourse.com  Fact Sheet for Healthcare Providers: SeriousBroker.it  This test is not yet approved or cleared by the [place] FDA and has been authorized for detection and/or diagnosis of SARS-CoV-2 by FDA under an Emergency Use Authorization (EUA). This EUA will remain in effect (meaning this test can be used) for the duration of the COVID-19 declaration under Section 564(b)(1) of the Act, 21 U.S.C. section 360bbb-3(b)(1), unless the authorization is terminated or revoked.  Performed at [facility], [address] [place], [place] [address]   MRSA PCR Screening     Status: None   Collection Time: 10/30/20  1:11 AM   Specimen: Nasopharyngeal  Result Value Ref Range Status   MRSA by PCR NEGATIVE NEGATIVE Final    Comment:        The GeneXpert MRSA Assay (FDA approved for NASAL specimens only), is one component of a comprehensive MRSA colonization surveillance program. It is not intended to diagnose MRSA infection nor to guide or monitor treatment for MRSA infections. Performed at [facility], [address] [place], [place] [address]      Labs: BNP (last 3 results) Recent Labs    10/28/20 1512  BNP 224.1*   Basic Metabolic Panel: Recent Labs  Lab  10/28/20 1512 10/29/20 0328 10/30/20 0905  NA 140 144 141  K 4.6 4.6 4.5  CL 108 110 102  CO2 GLUCOSE 141* 120* 131*  BUN 29* 27* 21  CREATININE 0.88 0.82 0.68  CALCIUM 8.8* 8.6* 9.1  MG  --   --  1.7   Liver Function Tests: Recent Labs  Lab 10/28/20 1512  AST 188*  ALT 172*  ALKPHOS 138*  BILITOT 0.5  PROT 7.0  ALBUMIN 3.2*   No results for input(s): LIPASE, AMYLASE in the last 168 hours. No results for input(s): AMMONIA in the last 168 hours. CBC: Recent Labs  Lab 10/28/20 1512 10/29/20 0328  WBC 5.0 4.9  NEUTROABS 2.8  --  HGB 10.6* 10.7*  HCT 36.4 37.3  MCV 85.6 87.6  PLT 220 180   Cardiac Enzymes: No results for input(s): CKTOTAL, CKMB, CKMBINDEX, TROPONINI in the last 168 hours. BNP: Invalid input(s): POCBNP CBG: Recent Labs  Lab 10/29/20 1658 10/29/20 2133 10/30/20 0800 10/30/20 1201  GLUCAP 132* 118* 123* 183*   D-Dimer No results for input(s): DDIMER in the last 72 hours. Hgb A1c Recent Labs    10/29/20 0328  HGBA1C 6.4*   Lipid Profile No results for input(s): CHOL, HDL, LDLCALC, TRIG, CHOLHDL, LDLDIRECT in the last 72 hours. Thyroid function studies No results for input(s): TSH, T4TOTAL, T3FREE, THYROIDAB in the last 72 hours.  Invalid input(s): FREET3 Anemia work up No results for input(s): VITAMINB12, FOLATE, FERRITIN, TIBC, IRON, RETICCTPCT in the last 72 hours. Urinalysis    Component Value Date/Time   COLORURINE YELLOW (A) 10/28/2020 1632   APPEARANCEUR HAZY (A) 10/28/2020 1632   APPEARANCEUR Clear 05/10/2017 0932   LABSPEC 1.015 10/28/2020 1632   LABSPEC 1.038 06/17/2014 1357   PHURINE 5.0 10/28/2020 1632   GLUCOSEU NEGATIVE 10/28/2020 1632   GLUCOSEU Negative 06/17/2014 1357   HGBUR NEGATIVE 10/28/2020 1632   BILIRUBINUR NEGATIVE 10/28/2020 1632   BILIRUBINUR Negative 05/10/2017 0932   BILIRUBINUR Negative 06/17/2014 1357   KETONESUR NEGATIVE 10/28/2020 1632   PROTEINUR 100 (A) 10/28/2020 1632   NITRITE  NEGATIVE 10/28/2020 1632   LEUKOCYTESUR SMALL (A) 10/28/2020 1632   LEUKOCYTESUR Negative 06/17/2014 1357   Sepsis Labs Invalid input(s): PROCALCITONIN,  WBC,  LACTICIDVEN Microbiology Recent Results (from the past 240 hour(s))  Resp Panel by RT-PCR (Flu A&B, Covid) Nasopharyngeal Swab     Status: None   Collection Time: 10/28/20  3:12 PM   Specimen: Nasopharyngeal Swab; Nasopharyngeal(NP) swabs in vial transport medium  Result Value Ref Range Status   SARS Coronavirus 2 by RT PCR NEGATIVE NEGATIVE Final    Comment: (NOTE) SARS-CoV-2 target nucleic acids are NOT DETECTED.  The SARS-CoV-2 RNA is generally detectable in upper respiratory specimens during the acute phase of infection. The lowest concentration of SARS-CoV-2 viral copies this assay can detect is 138 copies/mL. A negative result does not preclude SARS-Cov-2 infection and should not be used as the sole basis for treatment or other patient management decisions. A negative result may occur with  improper specimen collection/handling, submission of specimen other than nasopharyngeal swab, presence of viral mutation(s) within the areas targeted by this assay, and inadequate number of viral copies(<138 copies/mL). A negative result must be combined with clinical observations, patient history, and epidemiological information. The expected result is Negative.  Fact Sheet for Patients:  BloggerCourse.com  Fact Sheet for Healthcare Providers:  SeriousBroker.it  This test is no t yet approved or cleared by the [place] FDA and  has been authorized for detection and/or diagnosis of SARS-CoV-2 by FDA under an Emergency Use Authorization (EUA). This EUA will remain  in effect (meaning this test can be used) for the duration of the COVID-19 declaration under Section 564(b)(1) of the Act, 21 U.S.C.section 360bbb-3(b)(1), unless the authorization is terminated  or revoked  sooner.       Influenza A by PCR NEGATIVE NEGATIVE Final   Influenza B by PCR NEGATIVE NEGATIVE Final    Comment: (NOTE) The Xpert Xpress SARS-CoV-2/FLU/RSV plus assay is intended as an aid in the diagnosis of influenza from Nasopharyngeal swab specimens and should not be used as a sole basis for treatment. Nasal washings and aspirates are unacceptable for Xpert Xpress SARS-CoV-2/FLU/RSV testing.  Fact Sheet for Patients: BloggerCourse.com  Fact Sheet for Healthcare Providers: SeriousBroker.it  This test is not yet approved or cleared by the [place] FDA and has been authorized for detection and/or diagnosis of SARS-CoV-2 by FDA under an Emergency Use Authorization (EUA). This EUA will remain in effect (meaning this test can be used) for the duration of the COVID-19 declaration under Section 564(b)(1) of the Act, 21 U.S.C. section 360bbb-3(b)(1), unless the authorization is terminated or revoked.  Performed at [facility], [address] [place], [place] [address]   MRSA PCR Screening     Status: None   Collection Time: 10/30/20  1:11 AM   Specimen: Nasopharyngeal  Result Value Ref Range Status   MRSA by PCR NEGATIVE NEGATIVE Final    Comment:        The GeneXpert MRSA Assay (FDA approved for NASAL specimens only), is one component of a comprehensive MRSA colonization surveillance program. It is not intended to diagnose MRSA infection nor to guide or monitor treatment for MRSA infections. Performed at [facility], [address], [place], [place] [address]      Time coordinating discharge: Over 30 minutes  SIGNED:   Tresa Moore, MD  [facility] 10/30/2020, 12:22 PM Pager   If 7PM-7AM, please contact night-coverage

## 2020-10-31 LAB — BASIC METABOLIC PANEL
Anion gap: 9 (ref 5–15)
BUN: 31 mg/dL — ABNORMAL HIGH (ref 8–23)
CO2: 29 mmol/L (ref 22–32)
Calcium: 9 mg/dL (ref 8.9–10.3)
Chloride: 100 mmol/L (ref 98–111)
Creatinine, Ser: 0.82 mg/dL (ref 0.44–1.00)
GFR, Estimated: >60 mL/min (ref >60)
Glucose, Bld: 141 mg/dL — ABNORMAL HIGH (ref 70–99)
Potassium: 4 mmol/L (ref 3.5–5.1)
Sodium: 138 mmol/L (ref 135–145)

## 2020-10-31 LAB — GLUCOSE, CAPILLARY
Glucose-Capillary: 141 mg/dL — ABNORMAL HIGH (ref 70–99)
Glucose-Capillary: 166 mg/dL — ABNORMAL HIGH (ref 70–99)
Glucose-Capillary: 127 mg/dL — ABNORMAL HIGH (ref 70–99)
Glucose-Capillary: 137 mg/dL — ABNORMAL HIGH (ref 70–99)

## 2020-10-31 LAB — MAGNESIUM: Magnesium: 1.8 mg/dL (ref 1.7–2.4)

## 2020-10-31 MED ORDER — MAGNESIUM SULFATE 2 GM/50ML IV SOLN
2.0000 g | Freq: Once | INTRAVENOUS | Status: AC
  Administered 2020-10-31: 09:00:00 2 g via INTRAVENOUS
  Filled 2020-10-31: qty 50

## 2020-10-31 NOTE — Progress Notes (Signed)
PROGRESS NOTE    [name]  ZOX:[id number] DOB: 1932/07/22 DOA: 10/28/2020 PCP: Lauro Regulus, MD   Brief Narrative:  86 y.o. Caucasian female with a known history of COPD, anxiety, depression, type 2 diabetes mellitus, GERD and dyslipidemia who presented to the emergency room with acute onset of dizziness with generalized weakness that started today.  She admits to dyspnea on exertion as well as orthopnea and paroxysmal nocturnal dyspnea.  No chest pain or palpitations.  No current nausea or vomiting or abdominal pain. No dysuria, oliguria or hematuria or flank pain.  12/22: Started on intravenous Lasix for acute decompensated heart failure suspected.  Symptomatically improved.  No pain complaints on my evaluation today.  Cardiology evaluation appreciated.  12/23: Patient symptomatically improved.  Lost IV access last night.  Now agreeable to replacement.  Net -1 L  12/24: Leg edema improved.  Shortness of breath improved.  Patient still has some mild pursed lip breathing notable on exam.   Assessment & Plan:   Active Problems:   Acute CHF (congestive heart failure) (HCC)   Acute decompensated heart failure (HCC)  Acute on chronic diastolic congestive heart failure Acute hypoxic respiratory failure, secondary to above.  Resolved Patient started on IV Lasix with improvement in volume status Weaned off supplemental oxygen Cardiology recommendations appreciated Echocardiogram with grade 1 diastolic dysfunction and normal ejection fraction Ins and outs not accurately recorded Plan: Continue IV Lasix 40 mg twice daily Daily BMP Monitor renal function and electrolytes Strict I's and O's Daily weights Anticipate transition to oral Lasix in preparation for discharge tomorrow  Essential hypertension Continue Norvasc Continue beta-blockade  Type 2 diabetes mellitus Oral agents held Check hemoglobin A1c Moderate sliding scale  Anxiety Depression Continue home  regimen of Zoloft, Depakote, Seroquel     DVT prophylaxis: Lovenox Code Status: DNR/DNI Family Communication: Fonnie Birkenhead via phone [phone] on 10/29/2020 Disposition Plan: Status is: Inpatient  Remains inpatient appropriate because:Inpatient level of care appropriate due to severity of illness   Dispo: The patient is from: Home              Anticipated d/c is to: Home              Anticipated d/c date is: 1 day              Patient currently is not medically stable to d/c.   Last day of IV Lasix today.  Patient remained stable on room air plan to transition to p.o. diuretics and discharged back to assisted living facility tomorrow.    Consultants:   Cardiology-[facility]  Procedures:   None  Antimicrobials:  None   Subjective: Patient seen and examined.  Continues with some pursed lip breathing.  Appears short of breath.  On room air.  Objective: Vitals:   10/31/20 0436 10/31/20 0756 10/31/20 0805 10/31/20 1138  BP: (!) 152/67  (!) 132/57 (!) 106/36  Pulse: 86  69 60  Resp: 20  20 17   Temp: 98.7 F (37.1 C)  98.4 F (36.9 C) 98.4 F (36.9 C)  TempSrc: Oral  Oral Oral  SpO2: 92% 94% 91% 92%  Weight:      Height:        Intake/Output Summary (Last 24 hours) at 10/31/2020 1554 Last data filed at 10/31/2020 1300 Gross per 24 hour  Intake 720 ml  Output --  Net 720 ml   Filed Weights   10/28/20 1454 10/30/20 0428 10/31/20 0408  Weight: 76.4 kg 69.3 kg  70.3 kg    Examination:  General exam: No acute distress.  Appears frail Respiratory system: Fine crackles at the bases.  Normal work of breathing.  Room air Cardiovascular system: S1 & S2 heard, RRR. No JVD, murmurs, rubs, gallops or clicks. No pedal edema. Gastrointestinal system: Abdomen is nondistended, soft and nontender. No organomegaly or masses felt. Normal bowel sounds heard. Central nervous system: Alert and oriented. No focal neurological deficits. Extremities: Symmetric 5 x 5  power. Skin: No rashes, lesions or ulcers Psychiatry: Judgement and insight appear normal. Mood & affect appropriate.     Data Reviewed: I have personally reviewed following labs and imaging studies  CBC: Recent Labs  Lab 10/28/20 1512 10/29/20 0328  WBC 5.0 4.9  NEUTROABS 2.8  --   HGB 10.6* 10.7*  HCT 36.4 37.3  MCV 85.6 87.6  PLT 220 180   Basic Metabolic Panel: Recent Labs  Lab 10/28/20 1512 10/29/20 0328 10/30/20 0905 10/31/20 0529  NA 140 144 141 138  K 4.6 4.6 4.5 4.0  CL 108 110 102 100  CO2 26 26 31 29   GLUCOSE 141* 120* 131* 141*  BUN 29* 27* 21 31*  CREATININE 0.88 0.82 0.68 0.82  CALCIUM 8.8* 8.6* 9.1 9.0  MG  --   --  1.7 1.8   GFR: Estimated Creatinine Clearance: 43.1 mL/min (by C-G formula based on SCr of 0.82 mg/dL). Liver Function Tests: Recent Labs  Lab 10/28/20 1512  AST 188*  ALT 172*  ALKPHOS 138*  BILITOT 0.5  PROT 7.0  ALBUMIN 3.2*   No results for input(s): LIPASE, AMYLASE in the last 168 hours. No results for input(s): AMMONIA in the last 168 hours. Coagulation Profile: No results for input(s): INR, PROTIME in the last 168 hours. Cardiac Enzymes: No results for input(s): CKTOTAL, CKMB, CKMBINDEX, TROPONINI in the last 168 hours. BNP (last 3 results) No results for input(s): PROBNP in the last 8760 hours. HbA1C: Recent Labs    10/29/20 0328  HGBA1C 6.4*   CBG: Recent Labs  Lab 10/30/20 1201 10/30/20 1543 10/30/20 2104 10/31/20 0733 10/31/20 1136  GLUCAP 183* 129* 174* 141* 166*   Lipid Profile: No results for input(s): CHOL, HDL, LDLCALC, TRIG, CHOLHDL, LDLDIRECT in the last 72 hours. Thyroid Function Tests: No results for input(s): TSH, T4TOTAL, FREET4, T3FREE, THYROIDAB in the last 72 hours. Anemia Panel: No results for input(s): VITAMINB12, FOLATE, FERRITIN, TIBC, IRON, RETICCTPCT in the last 72 hours. Sepsis Labs: Recent Labs  Lab 10/28/20 1518 10/28/20 1632  LATICACIDVEN 1.2 0.9    Recent Results  (from the past 240 hour(s))  Resp Panel by RT-PCR (Flu A&B, Covid) Nasopharyngeal Swab     Status: None   Collection Time: 10/28/20  3:12 PM   Specimen: Nasopharyngeal Swab; Nasopharyngeal(NP) swabs in vial transport medium  Result Value Ref Range Status   SARS Coronavirus 2 by RT PCR NEGATIVE NEGATIVE Final    Comment: (NOTE) SARS-CoV-2 target nucleic acids are NOT DETECTED.  The SARS-CoV-2 RNA is generally detectable in upper respiratory specimens during the acute phase of infection. The lowest concentration of SARS-CoV-2 viral copies this assay can detect is 138 copies/mL. A negative result does not preclude SARS-Cov-2 infection and should not be used as the sole basis for treatment or other patient management decisions. A negative result may occur with  improper specimen collection/handling, submission of specimen other than nasopharyngeal swab, presence of viral mutation(s) within the areas targeted by this assay, and inadequate number of viral copies(<138 copies/mL). A  negative result must be combined with clinical observations, patient history, and epidemiological information. The expected result is Negative.  Fact Sheet for Patients:  BloggerCourse.com  Fact Sheet for Healthcare Providers:  SeriousBroker.it  This test is no t yet approved or cleared by the [place] FDA and  has been authorized for detection and/or diagnosis of SARS-CoV-2 by FDA under an Emergency Use Authorization (EUA). This EUA will remain  in effect (meaning this test can be used) for the duration of the COVID-19 declaration under Section 564(b)(1) of the Act, 21 U.S.C.section 360bbb-3(b)(1), unless the authorization is terminated  or revoked sooner.       Influenza A by PCR NEGATIVE NEGATIVE Final   Influenza B by PCR NEGATIVE NEGATIVE Final    Comment: (NOTE) The Xpert Xpress SARS-CoV-2/FLU/RSV plus assay is intended as an aid in the  diagnosis of influenza from Nasopharyngeal swab specimens and should not be used as a sole basis for treatment. Nasal washings and aspirates are unacceptable for Xpert Xpress SARS-CoV-2/FLU/RSV testing.  Fact Sheet for Patients: BloggerCourse.com  Fact Sheet for Healthcare Providers: SeriousBroker.it  This test is not yet approved or cleared by the [place] FDA and has been authorized for detection and/or diagnosis of SARS-CoV-2 by FDA under an Emergency Use Authorization (EUA). This EUA will remain in effect (meaning this test can be used) for the duration of the COVID-19 declaration under Section 564(b)(1) of the Act, 21 U.S.C. section 360bbb-3(b)(1), unless the authorization is terminated or revoked.  Performed at [facility], [address] [place], [place] [address]   MRSA PCR Screening     Status: None   Collection Time: 10/30/20  1:11 AM   Specimen: Nasopharyngeal  Result Value Ref Range Status   MRSA by PCR NEGATIVE NEGATIVE Final    Comment:        The GeneXpert MRSA Assay (FDA approved for NASAL specimens only), is one component of a comprehensive MRSA colonization surveillance program. It is not intended to diagnose MRSA infection nor to guide or monitor treatment for MRSA infections. Performed at [facility], [address], [place], [place] [address]          Radiology Studies: No results found.      Scheduled Meds: . amLODipine  5 mg Oral Daily  . aspirin EC  81 mg Oral Daily  . cholecalciferol  2,000 Units Oral Daily  . COVID-19 mRNA vaccine (Pfizer)  0.3 mL Intramuscular ONCE-1600  . dicyclomine  20 mg Oral QID  . divalproex  250 mg Oral QHS  . enoxaparin (LOVENOX) injection  40 mg Subcutaneous Q24H  . feeding supplement  237 mL Oral BID BM  . furosemide  40 mg Intravenous Q12H  . insulin aspart  0-15 Units Subcutaneous TID WC  . insulin aspart  0-5 Units  Subcutaneous QHS  . ipratropium-albuterol  3 mL Nebulization BID  . loratadine  10 mg Oral Daily  . metoprolol tartrate  25 mg Oral BID  . multivitamin with minerals  1 tablet Oral Daily  . olopatadine  1 drop Both Eyes Daily  . pantoprazole  40 mg Oral Daily  . QUEtiapine  12.5 mg Oral Daily  . sertraline  50 mg Oral Daily   Continuous Infusions:   LOS: 3 days    Time spent: 15 minutes    Tresa Moore, MD [facility] Pager [phone]  If 7PM-7AM, please contact night-coverage 10/31/2020, 3:54 PM

## 2020-10-31 NOTE — Progress Notes (Signed)
Order received from Dr Sreenath to discontinue telemetry °

## 2020-10-31 NOTE — Care Management Important Message (Signed)
Important Message  Patient Details  Name: [name] MRN: [id number] Date of Birth: 1932/09/20   Medicare Important Message Given:  Yes     Olegario Messier A Cadan Maggart 10/31/2020, 11:42 AM

## 2020-10-31 NOTE — Progress Notes (Signed)
[facility] Encounter Note  Patient: Evadean Sproule / Admit Date: 10/28/2020 / Date of Encounter: 10/31/2020, 9:31 AM   Subjective: 12/23.  Patient has had some slight improvements of oxygenation and shortness of breath since admission to the hospital.  The patient did have a chest x-ray showing congestive heart failure and does still have some bibasilar Rales with decreased breath sounds consistent with pleural effusion and pulmonary edema.  The patient has had some urine output which has improved her oxygenation.  Blood pressure has been relatively controlled and the patient has had no anginal symptoms or acute coronary syndrome.  12/24.  Patient has continued to have improvements with intravenous Lasix and virtually no further lower extremity edema with improved Rales of the lungs.  The patient's comfort level and oxygenation have significantly improved over the last few days.  There has been no evidence of chest discomfort or other concerns of cardiovascular disease.  Heart rate has been controlled.  Review of Systems: Positive for: Shortness of breath Negative for: Vision change, hearing change, syncope, dizziness, nausea, vomiting,diarrhea, bloody stool, stomach pain, cough, congestion, diaphoresis, urinary frequency, urinary pain,skin lesions, skin rashes Others previously listed  Objective: Telemetry: Normal sinus rhythm Physical Exam: Blood pressure (!) 132/57, pulse 69, temperature 98.4 F (36.9 C), temperature source Oral, resp. rate 20, height 5' (1.524 m), weight 70.3 kg, SpO2 91 %. Body mass index is 30.27 kg/m. General: Well developed, well nourished, in no acute distress. Head: Normocephalic, atraumatic, sclera non-icteric, no xanthomas, nares are without discharge. Neck: No apparent masses Lungs: Normal respirations with no wheezes, no rhonchi, bibasilar rales , some crackles decreased breath sounds of the base  Heart: Regular rate and rhythm, normal  S1 S2, no murmur, no rub, no gallop, PMI is normal size and placement, carotid upstroke normal without bruit, jugular venous pressure normal Abdomen: Soft, non-tender, non-distended with normoactive bowel sounds. No hepatosplenomegaly. Abdominal aorta is normal size without bruit Extremities: Trace edema, no clubbing, no cyanosis, no ulcers,  Peripheral: 2+ radial, 2+ femoral, 2+ dorsal pedal pulses Neuro: Alert and oriented. Moves all extremities spontaneously. Psych:  Responds to questions appropriately with a normal affect.   Intake/Output Summary (Last 24 hours) at 10/31/2020 0931 Last data filed at 10/30/2020 1933 Gross per 24 hour  Intake 480 ml  Output --  Net 480 ml    Inpatient Medications:  . amLODipine  5 mg Oral Daily  . aspirin EC  81 mg Oral Daily  . cholecalciferol  2,000 Units Oral Daily  . COVID-19 mRNA vaccine (Pfizer)  0.3 mL Intramuscular ONCE-1600  . dicyclomine  20 mg Oral QID  . divalproex  250 mg Oral QHS  . enoxaparin (LOVENOX) injection  40 mg Subcutaneous Q24H  . feeding supplement  237 mL Oral BID BM  . furosemide  40 mg Intravenous Q12H  . insulin aspart  0-15 Units Subcutaneous TID WC  . insulin aspart  0-5 Units Subcutaneous QHS  . ipratropium-albuterol  3 mL Nebulization BID  . loratadine  10 mg Oral Daily  . metoprolol tartrate  25 mg Oral BID  . multivitamin with minerals  1 tablet Oral Daily  . olopatadine  1 drop Both Eyes Daily  . pantoprazole  40 mg Oral Daily  . QUEtiapine  12.5 mg Oral Daily  . sertraline  50 mg Oral Daily   Infusions:  . magnesium sulfate bolus IVPB 2 g (10/31/20 0838)    Labs: Recent Labs    10/30/20 0905 10/31/20  0529  NA 141 138  K 4.5 4.0  CL 102 100  CO2 31 29  GLUCOSE 131* 141*  BUN 21 31*  CREATININE 0.68 0.82  CALCIUM 9.1 9.0  MG 1.7 1.8   Recent Labs    10/28/20 1512  AST 188*  ALT 172*  ALKPHOS 138*  BILITOT 0.5  PROT 7.0  ALBUMIN 3.2*   Recent Labs    10/28/20 1512 10/29/20 0328   WBC 5.0 4.9  NEUTROABS 2.8  --   HGB 10.6* 10.7*  HCT 36.4 37.3  MCV 85.6 87.6  PLT 220 180   No results for input(s): CKTOTAL, CKMB, TROPONINI in the last 72 hours. Invalid input(s): POCBNP Recent Labs    10/29/20 0328  HGBA1C 6.4*     Weights: Filed Weights   10/28/20 1454 10/30/20 0428 10/31/20 0408  Weight: 76.4 kg 69.3 kg 70.3 kg     Radiology/Studies:  CT Head Wo Contrast  Result Date: 10/09/2020 CLINICAL DATA:  Fall, head injury EXAM: CT HEAD WITHOUT CONTRAST CT CERVICAL SPINE WITHOUT CONTRAST TECHNIQUE: Multidetector CT imaging of the head and cervical spine was performed following the standard protocol without intravenous contrast. Multiplanar CT image reconstructions of the cervical spine were also generated. COMPARISON:  CT head 06/06/2019 FINDINGS: CT HEAD FINDINGS Brain: Normal anatomic configuration. Parenchymal volume loss is commensurate with the patient's age. Moderate periventricular white matter changes are present likely reflecting the sequela of small vessel ischemia. Remote lacunar infarcts are noted within the a left corona radiata, right putamen, and medial right cerebellar hemisphere. Right putaminal infarct is new since prior examination. No abnormal intra or extra-axial mass lesion or fluid collection. No abnormal mass effect or midline shift. No evidence of acute intracranial hemorrhage or infarct. Ventricular size is normal. Cerebellum unremarkable. Vascular: No asymmetric hyperdense vasculature at the skull base. Skull: Intact Sinuses/Orbits: Paranasal sinuses are clear. Orbits are unremarkable. Other: There is fluid opacification of a few inferior left mastoid air cells, nonspecific. Mastoid air cells and middle ear cavities are otherwise clear. Small occipital scalp hematoma noted. CT CERVICAL SPINE FINDINGS Assessment of the cervical spine is moderately limited by motion artifact Alignment: Normal cervical lordosis.  No listhesis. Skull base and vertebrae:  The craniocervical junction is unremarkable. The atlantodental interval is normal. No acute fracture of the cervical spine. Soft tissues and spinal canal: No prevertebral fluid or swelling. No visible canal hematoma. Disc levels: Review of the sagittal reformats demonstrates preservation of cervical lordosis. A remote compression fracture of C6 is identified with approximately 50% loss of height. Remaining vertebral body height has been preserved. There is intervertebral disc space narrowing and endplate remodeling at C6-7 with associated vacuum disc phenomena in keeping with changes of severe degenerative disc disease. Milder degenerative changes are noted at C5-6. Review of the a axial images demonstrates multilevel moderate to severe uncovertebral and facet arthrosis resulting in multilevel moderate to severe neural foraminal narrowing throughout the cervical spine and mild central canal stenosis at C3-4 and C5-6. Upper chest: Unremarkable Other: There is asymmetric enlargement and heterogeneous attenuation of the left thyroid lobe IMPRESSION: No acute intracranial injury. No calvarial fracture. Small occipital scalp hematoma. Multiple remote infarcts with interval development of a right putaminal infarct since prior examination of 06/06/2019. No acute fracture or listhesis of the cervical spine, allowing for moderate limitation by motion artifact. Multilevel severe uncovertebral and facet arthrosis resulting in multilevel moderate to severe neural foraminal narrowing. Remote C6 compression fracture with approximately 50% loss of height. Asymmetric  enlargement of the left thyroid lobe. This could be better assessed with dedicated thyroid sonography if indicated. Electronically Signed   By: Helyn Numbers MD   On: 10/09/2020 00:25   CT Cervical Spine Wo Contrast  Result Date: 10/09/2020 CLINICAL DATA:  Fall, head injury EXAM: CT HEAD WITHOUT CONTRAST CT CERVICAL SPINE WITHOUT CONTRAST TECHNIQUE: Multidetector  CT imaging of the head and cervical spine was performed following the standard protocol without intravenous contrast. Multiplanar CT image reconstructions of the cervical spine were also generated. COMPARISON:  CT head 06/06/2019 FINDINGS: CT HEAD FINDINGS Brain: Normal anatomic configuration. Parenchymal volume loss is commensurate with the patient's age. Moderate periventricular white matter changes are present likely reflecting the sequela of small vessel ischemia. Remote lacunar infarcts are noted within the a left corona radiata, right putamen, and medial right cerebellar hemisphere. Right putaminal infarct is new since prior examination. No abnormal intra or extra-axial mass lesion or fluid collection. No abnormal mass effect or midline shift. No evidence of acute intracranial hemorrhage or infarct. Ventricular size is normal. Cerebellum unremarkable. Vascular: No asymmetric hyperdense vasculature at the skull base. Skull: Intact Sinuses/Orbits: Paranasal sinuses are clear. Orbits are unremarkable. Other: There is fluid opacification of a few inferior left mastoid air cells, nonspecific. Mastoid air cells and middle ear cavities are otherwise clear. Small occipital scalp hematoma noted. CT CERVICAL SPINE FINDINGS Assessment of the cervical spine is moderately limited by motion artifact Alignment: Normal cervical lordosis.  No listhesis. Skull base and vertebrae: The craniocervical junction is unremarkable. The atlantodental interval is normal. No acute fracture of the cervical spine. Soft tissues and spinal canal: No prevertebral fluid or swelling. No visible canal hematoma. Disc levels: Review of the sagittal reformats demonstrates preservation of cervical lordosis. A remote compression fracture of C6 is identified with approximately 50% loss of height. Remaining vertebral body height has been preserved. There is intervertebral disc space narrowing and endplate remodeling at C6-7 with associated vacuum disc  phenomena in keeping with changes of severe degenerative disc disease. Milder degenerative changes are noted at C5-6. Review of the a axial images demonstrates multilevel moderate to severe uncovertebral and facet arthrosis resulting in multilevel moderate to severe neural foraminal narrowing throughout the cervical spine and mild central canal stenosis at C3-4 and C5-6. Upper chest: Unremarkable Other: There is asymmetric enlargement and heterogeneous attenuation of the left thyroid lobe IMPRESSION: No acute intracranial injury. No calvarial fracture. Small occipital scalp hematoma. Multiple remote infarcts with interval development of a right putaminal infarct since prior examination of 06/06/2019. No acute fracture or listhesis of the cervical spine, allowing for moderate limitation by motion artifact. Multilevel severe uncovertebral and facet arthrosis resulting in multilevel moderate to severe neural foraminal narrowing. Remote C6 compression fracture with approximately 50% loss of height. Asymmetric enlargement of the left thyroid lobe. This could be better assessed with dedicated thyroid sonography if indicated. Electronically Signed   By: Helyn Numbers MD   On: 10/09/2020 00:25   DG Chest Portable 1 View  Result Date: 10/28/2020 CLINICAL DATA:  Dizziness and weakness, initial encounter EXAM: PORTABLE CHEST 1 VIEW COMPARISON:  10/17/2019 FINDINGS: Cardiac shadow is enlarged. Aortic calcifications are seen. Mild vascular congestion is noted with mild interstitial edema. No focal confluent infiltrate is seen. No bony abnormality is noted. IMPRESSION: CHF. Electronically Signed   By: Alcide Clever M.D.   On: 10/28/2020 16:38   ECHOCARDIOGRAM COMPLETE  Result Date: 10/29/2020    ECHOCARDIOGRAM REPORT   Patient Name:   Chinaza  NELL Cazarez Date of Exam: 10/29/2020 Medical Rec #:  [id number]      Height:       60.0 in Accession #:    [id number]     Weight:       168.5 lb Date of Birth:  May 01, 1932     BSA:           1.735 m Patient Age:    88 years       BP:           143/68 mmHg Patient Gender: F              HR:           62 bpm. Exam Location:  [facility] Procedure: 2D Echo, Color Doppler, Cardiac Doppler and Intracardiac            Opacification Agent Indications:     I50.31 CHF-Acute Diastolic  History:         Patient has prior history of Echocardiogram examinations. COPD;                  Risk Factors:Dyslipidemia and Diabetes.  Sonographer:     Humphrey Rolls RDCS (AE) Referring Phys:  [id number] Vernetta Honey MANSY Diagnosing Phys: Marcina Millard MD  Sonographer Comments: Suboptimal apical window and no subcostal window. Image acquisition challenging due to patient body habitus and Image acquisition challenging due to COPD. IMPRESSIONS  1. Left ventricular ejection fraction, by estimation, is 60 to 65%. The left ventricle has normal function. The left ventricle has no regional wall motion abnormalities. Left ventricular diastolic parameters were normal.  2. Right ventricular systolic function is normal. The right ventricular size is normal.  3. The mitral valve is normal in structure. Mild mitral valve regurgitation. No evidence of mitral stenosis.  4. The aortic valve is normal in structure. Aortic valve regurgitation is not visualized. No aortic stenosis is present.  5. The inferior vena cava is normal in size with greater than 50% respiratory variability, suggesting right atrial pressure of 3 mmHg. FINDINGS  Left Ventricle: Left ventricular ejection fraction, by estimation, is 60 to 65%. The left ventricle has normal function. The left ventricle has no regional wall motion abnormalities. Definity contrast agent was given IV to delineate the left ventricular  endocardial borders. The left ventricular internal cavity size was normal in size. There is no left ventricular hypertrophy. Left ventricular diastolic parameters were normal. Right Ventricle: The right ventricular size is normal. No increase in right ventricular wall  thickness. Right ventricular systolic function is normal. Left Atrium: Left atrial size was normal in size. Right Atrium: Right atrial size was normal in size. Pericardium: There is no evidence of pericardial effusion. Mitral Valve: The mitral valve is normal in structure. Mild mitral valve regurgitation. No evidence of mitral valve stenosis. MV peak gradient, 7.6 mmHg. The mean mitral valve gradient is 2.0 mmHg. Tricuspid Valve: The tricuspid valve is normal in structure. Tricuspid valve regurgitation is mild . No evidence of tricuspid stenosis. Aortic Valve: The aortic valve is normal in structure. Aortic valve regurgitation is not visualized. No aortic stenosis is present. Aortic valve mean gradient measures 4.0 mmHg. Aortic valve peak gradient measures 8.0 mmHg. Aortic valve area, by VTI measures 1.80 cm. Pulmonic Valve: The pulmonic valve was normal in structure. Pulmonic valve regurgitation is not visualized. No evidence of pulmonic stenosis. Aorta: The aortic root is normal in size and structure. Venous: The inferior vena cava is normal in size with greater than 50%  respiratory variability, suggesting right atrial pressure of 3 mmHg. IAS/Shunts: No atrial level shunt detected by color flow Doppler.  LEFT VENTRICLE PLAX 2D LVIDd:         3.60 cm  Diastology LVIDs:         2.20 cm  LV e' lateral:   6.31 cm/s LV PW:         1.24 cm  LV E/e' lateral: 20.1 LV IVS:        0.89 cm LVOT diam:     1.90 cm LV SV:         46 LV SV Index:   27 LVOT Area:     2.84 cm  RIGHT VENTRICLE RV Basal diam:  3.02 cm LEFT ATRIUM           Index       RIGHT ATRIUM           Index LA diam:      4.30 cm 2.48 cm/m  RA Area:     14.60 cm LA Vol (A4C): 94.5 ml 54.45 ml/m RA Volume:   36.00 ml  20.74 ml/m  AORTIC VALVE                   PULMONIC VALVE AV Area (Vmax):    1.91 cm    PV Vmax:       1.21 m/s AV Area (Vmean):   1.81 cm    PV Vmean:      83.700 cm/s AV Area (VTI):     1.80 cm    PV VTI:        0.236 m AV Vmax:            141.00 cm/s PV Peak grad:  5.9 mmHg AV Vmean:          93.500 cm/s PV Mean grad:  3.0 mmHg AV VTI:            0.258 m AV Peak Grad:      8.0 mmHg AV Mean Grad:      4.0 mmHg LVOT Vmax:         95.10 cm/s LVOT Vmean:        59.700 cm/s LVOT VTI:          0.164 m LVOT/AV VTI ratio: 0.64  AORTA Ao Root diam: 2.90 cm MITRAL VALVE MV Area (PHT): 2.12 cm     SHUNTS MV Peak grad:  7.6 mmHg     Systemic VTI:  0.16 m MV Mean grad:  2.0 mmHg     Systemic Diam: 1.90 cm MV Vmax:       1.38 m/s MV Vmean:      60.7 cm/s MV Decel Time: 358 msec MV E velocity: 127.00 cm/s MV A velocity: 51.40 cm/s MV E/A ratio:  2.47 Marcina MillardAlexander Paraschos MD Electronically signed by Marcina MillardAlexander Paraschos MD Signature Date/Time: 10/29/2020/1:13:25 PM    Final      Assessment and Recommendation  [age] y.o. female with known diabetes hypertension hyperlipidemia and abnormal EKG with acute diastolic dysfunction congestive heart failure without evidence of myocardial infarction and/or acute coronary syndrome 1.  Continue intravenous Lasix which appears to be working fairly well with treatment of acute on chronic diastolic dysfunction congestive heart failure.  Depending on ambulation today the patient can be discharged to home with 40 mg of oral Lasix for transition tomorrow 2.  No further cardiac diagnostics necessary at this time due to echocardiogram showing normal LV systolic function with no evidence  of significant valvular heart disease 3.  Hypertension control with amlodipine metoprolol combination without change today 4.  Further supportive care other concerns and would continue to follow with ambulation for potential for discharge home and/or outside facility  Signed, Arnoldo Hooker M.D. FACC

## 2020-10-31 NOTE — Progress Notes (Signed)
Physical Therapy Treatment Patient Details Name: Laquilla Dault MRN: [id number] DOB: 04-08-1932 Today's Date: 10/31/2020    History of Present Illness      PT Comments    "I don't want to go back to my ALF tonight, there won't be anyone there to help me".  Pt very pleasant and agreed to out of bed activity. Supine to sit with MinA and HOB slightly raised. Sitting EOB unsupported x 5 minutes without LOB. No c/o pain, pt on RA at 94% O2 sats. Sit to stand with MinA, gait training with RW x 43f with SBA for safety. 89% O2 sats upon return, MinA for LE management back to supine. Pt left in bed with all needs met, bed alarm on, call bell in reach.   Follow Up Recommendations        Equipment Recommendations       Recommendations for Other Services       Precautions / Restrictions      Mobility  Bed Mobility                  Transfers                    Ambulation/Gait                 Stairs             Wheelchair Mobility    Modified Rankin (Stroke Patients Only)       Balance                                            Cognition                                              Exercises      General Comments        Pertinent Vitals/Pain      Home Living                      Prior Function            PT Goals (current goals can now be found in the care plan section)      Frequency           PT Plan      Co-evaluation              AM-PAC PT "6 Clicks" Mobility   Outcome Measure                   End of Session               Time:  -     Charges:                        KMikel Cella PTA   KJosie Dixon12/24/2021, 4:05 PM

## 2020-11-01 LAB — BASIC METABOLIC PANEL
Anion gap: 8 (ref 5–15)
BUN: 39 mg/dL — ABNORMAL HIGH (ref 8–23)
CO2: 30 mmol/L (ref 22–32)
Calcium: 9 mg/dL (ref 8.9–10.3)
Chloride: 102 mmol/L (ref 98–111)
Creatinine, Ser: 0.85 mg/dL (ref 0.44–1.00)
GFR, Estimated: >60 mL/min (ref >60)
Glucose, Bld: 142 mg/dL — ABNORMAL HIGH (ref 70–99)
Potassium: 4.3 mmol/L (ref 3.5–5.1)
Sodium: 140 mmol/L (ref 135–145)

## 2020-11-01 LAB — GLUCOSE, CAPILLARY
Glucose-Capillary: 150 mg/dL — ABNORMAL HIGH (ref 70–99)
Glucose-Capillary: 153 mg/dL — ABNORMAL HIGH (ref 70–99)
Glucose-Capillary: 131 mg/dL — ABNORMAL HIGH (ref 70–99)

## 2020-11-01 LAB — MAGNESIUM: Magnesium: 2.4 mg/dL (ref 1.7–2.4)

## 2020-11-01 MED ORDER — IPRATROPIUM-ALBUTEROL 0.5-2.5 (3) MG/3ML IN SOLN: 3.0000 mL | Freq: Four times a day (QID) | RESPIRATORY_TRACT | Status: DC | PRN

## 2020-11-01 MED ORDER — FUROSEMIDE 40 MG PO TABS
40.0000 mg | ORAL_TABLET | Freq: Every day | ORAL | Status: DC
  Administered 2020-11-02 – 2020-11-03 (×2): 40 mg via ORAL
  Filled 2020-11-01 (×2): qty 1

## 2020-11-01 NOTE — Progress Notes (Signed)
[facility] Encounter Note  Patient: [name] / Admit Date: 10/28/2020 / Date of Encounter: 11/01/2020, 5:54 AM   Subjective: 12/23.  Patient has had some slight improvements of oxygenation and shortness of breath since admission to the hospital.  The patient did have a chest x-ray showing congestive heart failure and does still have some bibasilar Rales with decreased breath sounds consistent with pleural effusion and pulmonary edema.  The patient has had some urine output which has improved her oxygenation.  Blood pressure has been relatively controlled and the patient has had no anginal symptoms or acute coronary syndrome.  12/24.  Patient has continued to have improvements with intravenous Lasix and virtually no further lower extremity edema with improved Rales of the lungs.  The patient's comfort level and oxygenation have significantly improved over the last few days.  There has been no evidence of chest discomfort or other concerns of cardiovascular disease.  Heart rate has been controlled.  12/25 overall patient has had no significant changes from yesterday but significantly improved from admission.  Breathing is good today with clear lungs and no evidence of PND orthopnea syncope dizziness nausea or diaphoresis.  There is no evidence of chest discomfort.  Review of Systems: Positive for: Shortness of breath Negative for: Vision change, hearing change, syncope, dizziness, nausea, vomiting,diarrhea, bloody stool, stomach pain, cough, congestion, diaphoresis, urinary frequency, urinary pain,skin lesions, skin rashes Others previously listed  Objective: Telemetry: Normal sinus rhythm Physical Exam: Blood pressure (!) 146/53, pulse 85, temperature 98.4 F (36.9 C), resp. rate 20, height 5' (1.524 m), weight 70.3 kg, SpO2 91 %. Body mass index is 30.27 kg/m. General: Well developed, well nourished, in no acute distress. Head: Normocephalic, atraumatic, sclera  non-icteric, no xanthomas, nares are without discharge. Neck: No apparent masses Lungs: Normal respirations with no wheezes, no rhonchi, bibasilar rales , some crackles decreased breath sounds of the base on the left side  Heart: Regular rate and rhythm, normal S1 S2, no murmur, no rub, no gallop, PMI is normal size and placement, carotid upstroke normal without bruit, jugular venous pressure normal Abdomen: Soft, non-tender, non-distended with normoactive bowel sounds. No hepatosplenomegaly. Abdominal aorta is normal size without bruit Extremities: Trace edema, no clubbing, no cyanosis, no ulcers,  Peripheral: 2+ radial, 2+ femoral, 2+ dorsal pedal pulses Neuro: Alert and oriented. Moves all extremities spontaneously. Psych:  Responds to questions appropriately with a normal affect.   Intake/Output Summary (Last 24 hours) at 11/01/2020 0554 Last data filed at 10/31/2020 1700 Gross per 24 hour  Intake 720 ml  Output --  Net 720 ml    Inpatient Medications:  . amLODipine  5 mg Oral Daily  . aspirin EC  81 mg Oral Daily  . cholecalciferol  2,000 Units Oral Daily  . COVID-19 mRNA vaccine (Pfizer)  0.3 mL Intramuscular ONCE-1600  . dicyclomine  20 mg Oral QID  . divalproex  250 mg Oral QHS  . enoxaparin (LOVENOX) injection  40 mg Subcutaneous Q24H  . feeding supplement  237 mL Oral BID BM  . furosemide  40 mg Intravenous Q12H  . insulin aspart  0-15 Units Subcutaneous TID WC  . insulin aspart  0-5 Units Subcutaneous QHS  . ipratropium-albuterol  3 mL Nebulization BID  . loratadine  10 mg Oral Daily  . metoprolol tartrate  25 mg Oral BID  . multivitamin with minerals  1 tablet Oral Daily  . olopatadine  1 drop Both Eyes Daily  . pantoprazole  40  mg Oral Daily  . QUEtiapine  12.5 mg Oral Daily  . sertraline  50 mg Oral Daily   Infusions:    Labs: Recent Labs    10/31/20 0529 11/01/20 0440  NA 138 140  K 4.0 4.3  CL 100 102  CO2 29 30  GLUCOSE 141* 142*  BUN 31* 39*   CREATININE 0.82 0.85  CALCIUM 9.0 9.0  MG 1.8 2.4   No results for input(s): AST, ALT, ALKPHOS, BILITOT, PROT, ALBUMIN in the last 72 hours. No results for input(s): WBC, NEUTROABS, HGB, HCT, MCV, PLT in the last 72 hours. No results for input(s): CKTOTAL, CKMB, TROPONINI in the last 72 hours. Invalid input(s): POCBNP No results for input(s): HGBA1C in the last 72 hours.   Weights: Filed Weights   10/28/20 1454 10/30/20 0428 10/31/20 0408  Weight: 76.4 kg 69.3 kg 70.3 kg     Radiology/Studies:  CT Head Wo Contrast  Result Date: 10/09/2020 CLINICAL DATA:  Fall, head injury EXAM: CT HEAD WITHOUT CONTRAST CT CERVICAL SPINE WITHOUT CONTRAST TECHNIQUE: Multidetector CT imaging of the head and cervical spine was performed following the standard protocol without intravenous contrast. Multiplanar CT image reconstructions of the cervical spine were also generated. COMPARISON:  CT head 06/06/2019 FINDINGS: CT HEAD FINDINGS Brain: Normal anatomic configuration. Parenchymal volume loss is commensurate with the patient's age. Moderate periventricular white matter changes are present likely reflecting the sequela of small vessel ischemia. Remote lacunar infarcts are noted within the a left corona radiata, right putamen, and medial right cerebellar hemisphere. Right putaminal infarct is new since prior examination. No abnormal intra or extra-axial mass lesion or fluid collection. No abnormal mass effect or midline shift. No evidence of acute intracranial hemorrhage or infarct. Ventricular size is normal. Cerebellum unremarkable. Vascular: No asymmetric hyperdense vasculature at the skull base. Skull: Intact Sinuses/Orbits: Paranasal sinuses are clear. Orbits are unremarkable. Other: There is fluid opacification of a few inferior left mastoid air cells, nonspecific. Mastoid air cells and middle ear cavities are otherwise clear. Small occipital scalp hematoma noted. CT CERVICAL SPINE FINDINGS Assessment of the  cervical spine is moderately limited by motion artifact Alignment: Normal cervical lordosis.  No listhesis. Skull base and vertebrae: The craniocervical junction is unremarkable. The atlantodental interval is normal. No acute fracture of the cervical spine. Soft tissues and spinal canal: No prevertebral fluid or swelling. No visible canal hematoma. Disc levels: Review of the sagittal reformats demonstrates preservation of cervical lordosis. A remote compression fracture of C6 is identified with approximately 50% loss of height. Remaining vertebral body height has been preserved. There is intervertebral disc space narrowing and endplate remodeling at C6-7 with associated vacuum disc phenomena in keeping with changes of severe degenerative disc disease. Milder degenerative changes are noted at C5-6. Review of the a axial images demonstrates multilevel moderate to severe uncovertebral and facet arthrosis resulting in multilevel moderate to severe neural foraminal narrowing throughout the cervical spine and mild central canal stenosis at C3-4 and C5-6. Upper chest: Unremarkable Other: There is asymmetric enlargement and heterogeneous attenuation of the left thyroid lobe IMPRESSION: No acute intracranial injury. No calvarial fracture. Small occipital scalp hematoma. Multiple remote infarcts with interval development of a right putaminal infarct since prior examination of 06/06/2019. No acute fracture or listhesis of the cervical spine, allowing for moderate limitation by motion artifact. Multilevel severe uncovertebral and facet arthrosis resulting in multilevel moderate to severe neural foraminal narrowing. Remote C6 compression fracture with approximately 50% loss of height. Asymmetric enlargement of  the left thyroid lobe. This could be better assessed with dedicated thyroid sonography if indicated. Electronically Signed   By: Helyn NumbersAshesh  Parikh MD   On: 10/09/2020 00:25   CT Cervical Spine Wo Contrast  Result Date:  10/09/2020 CLINICAL DATA:  Fall, head injury EXAM: CT HEAD WITHOUT CONTRAST CT CERVICAL SPINE WITHOUT CONTRAST TECHNIQUE: Multidetector CT imaging of the head and cervical spine was performed following the standard protocol without intravenous contrast. Multiplanar CT image reconstructions of the cervical spine were also generated. COMPARISON:  CT head 06/06/2019 FINDINGS: CT HEAD FINDINGS Brain: Normal anatomic configuration. Parenchymal volume loss is commensurate with the patient's age. Moderate periventricular white matter changes are present likely reflecting the sequela of small vessel ischemia. Remote lacunar infarcts are noted within the a left corona radiata, right putamen, and medial right cerebellar hemisphere. Right putaminal infarct is new since prior examination. No abnormal intra or extra-axial mass lesion or fluid collection. No abnormal mass effect or midline shift. No evidence of acute intracranial hemorrhage or infarct. Ventricular size is normal. Cerebellum unremarkable. Vascular: No asymmetric hyperdense vasculature at the skull base. Skull: Intact Sinuses/Orbits: Paranasal sinuses are clear. Orbits are unremarkable. Other: There is fluid opacification of a few inferior left mastoid air cells, nonspecific. Mastoid air cells and middle ear cavities are otherwise clear. Small occipital scalp hematoma noted. CT CERVICAL SPINE FINDINGS Assessment of the cervical spine is moderately limited by motion artifact Alignment: Normal cervical lordosis.  No listhesis. Skull base and vertebrae: The craniocervical junction is unremarkable. The atlantodental interval is normal. No acute fracture of the cervical spine. Soft tissues and spinal canal: No prevertebral fluid or swelling. No visible canal hematoma. Disc levels: Review of the sagittal reformats demonstrates preservation of cervical lordosis. A remote compression fracture of C6 is identified with approximately 50% loss of height. Remaining vertebral  body height has been preserved. There is intervertebral disc space narrowing and endplate remodeling at C6-7 with associated vacuum disc phenomena in keeping with changes of severe degenerative disc disease. Milder degenerative changes are noted at C5-6. Review of the a axial images demonstrates multilevel moderate to severe uncovertebral and facet arthrosis resulting in multilevel moderate to severe neural foraminal narrowing throughout the cervical spine and mild central canal stenosis at C3-4 and C5-6. Upper chest: Unremarkable Other: There is asymmetric enlargement and heterogeneous attenuation of the left thyroid lobe IMPRESSION: No acute intracranial injury. No calvarial fracture. Small occipital scalp hematoma. Multiple remote infarcts with interval development of a right putaminal infarct since prior examination of 06/06/2019. No acute fracture or listhesis of the cervical spine, allowing for moderate limitation by motion artifact. Multilevel severe uncovertebral and facet arthrosis resulting in multilevel moderate to severe neural foraminal narrowing. Remote C6 compression fracture with approximately 50% loss of height. Asymmetric enlargement of the left thyroid lobe. This could be better assessed with dedicated thyroid sonography if indicated. Electronically Signed   By: Helyn NumbersAshesh  Parikh MD   On: 10/09/2020 00:25   DG Chest Portable 1 View  Result Date: 10/28/2020 CLINICAL DATA:  Dizziness and weakness, initial encounter EXAM: PORTABLE CHEST 1 VIEW COMPARISON:  10/17/2019 FINDINGS: Cardiac shadow is enlarged. Aortic calcifications are seen. Mild vascular congestion is noted with mild interstitial edema. No focal confluent infiltrate is seen. No bony abnormality is noted. IMPRESSION: CHF. Electronically Signed   By: Alcide CleverMark  Lukens M.D.   On: 10/28/2020 16:38   ECHOCARDIOGRAM COMPLETE  Result Date: 10/29/2020    ECHOCARDIOGRAM REPORT   Patient Name:   [name]  Date of Exam: 10/29/2020 Medical Rec  #:  [id number]      Height:       60.0 in Accession #:    [id number]     Weight:       168.5 lb Date of Birth:  25-Apr-1932     BSA:          1.735 m Patient Age:    86 years       BP:           143/68 mmHg Patient Gender: F              HR:           62 bpm. Exam Location:  [facility] Procedure: 2D Echo, Color Doppler, Cardiac Doppler and Intracardiac            Opacification Agent Indications:     I50.31 CHF-Acute Diastolic  History:         Patient has prior history of Echocardiogram examinations. COPD;                  Risk Factors:Dyslipidemia and Diabetes.  Sonographer:     Humphrey Rolls RDCS (AE) Referring Phys:  [id number] Vernetta Honey MANSY Diagnosing Phys: Marcina Millard MD  Sonographer Comments: Suboptimal apical window and no subcostal window. Image acquisition challenging due to patient body habitus and Image acquisition challenging due to COPD. IMPRESSIONS  1. Left ventricular ejection fraction, by estimation, is 60 to 65%. The left ventricle has normal function. The left ventricle has no regional wall motion abnormalities. Left ventricular diastolic parameters were normal.  2. Right ventricular systolic function is normal. The right ventricular size is normal.  3. The mitral valve is normal in structure. Mild mitral valve regurgitation. No evidence of mitral stenosis.  4. The aortic valve is normal in structure. Aortic valve regurgitation is not visualized. No aortic stenosis is present.  5. The inferior vena cava is normal in size with greater than 50% respiratory variability, suggesting right atrial pressure of 3 mmHg. FINDINGS  Left Ventricle: Left ventricular ejection fraction, by estimation, is 60 to 65%. The left ventricle has normal function. The left ventricle has no regional wall motion abnormalities. Definity contrast agent was given IV to delineate the left ventricular  endocardial borders. The left ventricular internal cavity size was normal in size. There is no left ventricular hypertrophy. Left  ventricular diastolic parameters were normal. Right Ventricle: The right ventricular size is normal. No increase in right ventricular wall thickness. Right ventricular systolic function is normal. Left Atrium: Left atrial size was normal in size. Right Atrium: Right atrial size was normal in size. Pericardium: There is no evidence of pericardial effusion. Mitral Valve: The mitral valve is normal in structure. Mild mitral valve regurgitation. No evidence of mitral valve stenosis. MV peak gradient, 7.6 mmHg. The mean mitral valve gradient is 2.0 mmHg. Tricuspid Valve: The tricuspid valve is normal in structure. Tricuspid valve regurgitation is mild . No evidence of tricuspid stenosis. Aortic Valve: The aortic valve is normal in structure. Aortic valve regurgitation is not visualized. No aortic stenosis is present. Aortic valve mean gradient measures 4.0 mmHg. Aortic valve peak gradient measures 8.0 mmHg. Aortic valve area, by VTI measures 1.80 cm. Pulmonic Valve: The pulmonic valve was normal in structure. Pulmonic valve regurgitation is not visualized. No evidence of pulmonic stenosis. Aorta: The aortic root is normal in size and structure. Venous: The inferior vena cava is normal in size with greater than 50% respiratory variability,  suggesting right atrial pressure of 3 mmHg. IAS/Shunts: No atrial level shunt detected by color flow Doppler.  LEFT VENTRICLE PLAX 2D LVIDd:         3.60 cm  Diastology LVIDs:         2.20 cm  LV e' lateral:   6.31 cm/s LV PW:         1.24 cm  LV E/e' lateral: 20.1 LV IVS:        0.89 cm LVOT diam:     1.90 cm LV SV:         46 LV SV Index:   27 LVOT Area:     2.84 cm  RIGHT VENTRICLE RV Basal diam:  3.02 cm LEFT ATRIUM           Index       RIGHT ATRIUM           Index LA diam:      4.30 cm 2.48 cm/m  RA Area:     14.60 cm LA Vol (A4C): 94.5 ml 54.45 ml/m RA Volume:   36.00 ml  20.74 ml/m  AORTIC VALVE                   PULMONIC VALVE AV Area (Vmax):    1.91 cm    PV Vmax:        1.21 m/s AV Area (Vmean):   1.81 cm    PV Vmean:      83.700 cm/s AV Area (VTI):     1.80 cm    PV VTI:        0.236 m AV Vmax:           141.00 cm/s PV Peak grad:  5.9 mmHg AV Vmean:          93.500 cm/s PV Mean grad:  3.0 mmHg AV VTI:            0.258 m AV Peak Grad:      8.0 mmHg AV Mean Grad:      4.0 mmHg LVOT Vmax:         95.10 cm/s LVOT Vmean:        59.700 cm/s LVOT VTI:          0.164 m LVOT/AV VTI ratio: 0.64  AORTA Ao Root diam: 2.90 cm MITRAL VALVE MV Area (PHT): 2.12 cm     SHUNTS MV Peak grad:  7.6 mmHg     Systemic VTI:  0.16 m MV Mean grad:  2.0 mmHg     Systemic Diam: 1.90 cm MV Vmax:       1.38 m/s MV Vmean:      60.7 cm/s MV Decel Time: 358 msec MV E velocity: 127.00 cm/s MV A velocity: 51.40 cm/s MV E/A ratio:  2.47 Marcina Millard MD Electronically signed by Marcina Millard MD Signature Date/Time: 10/29/2020/1:13:25 PM    Final      Assessment and Recommendation  86 y.o. female with known diabetes hypertension hyperlipidemia and abnormal EKG with acute diastolic dysfunction congestive heart failure without evidence of myocardial infarction and/or acute coronary syndrome 1.  Okay to transition furosemide to oral medication management at 40 mg orally each day for transition for discharged home 2.  No further cardiac diagnostics necessary at this time due to echocardiogram showing normal LV systolic function with no evidence of significant valvular heart disease 3.  Hypertension control with amlodipine metoprolol combination without change today 4.  Further supportive care other concerns and would  continue to follow with ambulation for potential for discharge home and/or outside facility today if able with follow-up next week  Signed, Arnoldo Hooker M.D. FACC

## 2020-11-01 NOTE — Progress Notes (Signed)
PROGRESS NOTE    [name]  BWI:[id number] DOB: 03-02-1932 DOA: 10/28/2020 PCP: Lauro Regulus, MD   Brief Narrative:  86 y.o. Caucasian female with a known history of COPD, anxiety, depression, type 2 diabetes mellitus, GERD and dyslipidemia who presented to the emergency room with acute onset of dizziness with generalized weakness that started today.  She admits to dyspnea on exertion as well as orthopnea and paroxysmal nocturnal dyspnea.  No chest pain or palpitations.  No current nausea or vomiting or abdominal pain. No dysuria, oliguria or hematuria or flank pain.  12/22: Started on intravenous Lasix for acute decompensated heart failure suspected.  Symptomatically improved.  No pain complaints on my evaluation today.  Cardiology evaluation appreciated.  12/23: Patient symptomatically improved.  Lost IV access last night.  Now agreeable to replacement.  Net -1 L  12/24: Leg edema improved.  Shortness of breath improved.  Patient still has some mild pursed lip breathing notable on exam.  12/25: Breathing improved.  Patient on room air.  Relayed to me that she would be willing to go back to assisted living facility however this is not her preferred option as apparently she has some issues with staff there in the past.  I have communicated her concerns to [facility].   Assessment & Plan:   Active Problems:   Acute CHF (congestive heart failure) (HCC)   Acute decompensated heart failure (HCC)  Acute on chronic diastolic congestive heart failure Acute hypoxic respiratory failure, secondary to above.  Resolved Patient started on IV Lasix with improvement in volume status Weaned off supplemental oxygen Cardiology recommendations appreciated Echocardiogram with grade 1 diastolic dysfunction and normal ejection fraction Ins and outs not accurately recorded Plan: DC IV Lasix Start p.o. furosemide 40 mg daily Monitor renal function and electrolytes Strict I's and  O's Daily weights Patient is essentially medically ready for discharge at this time however assisted living facility is not accepting patients today given the holiday.  Anticipate discharge within 24 hours if ALF is accepting patients.  Essential hypertension Continue Norvasc Continue beta-blockade  Type 2 diabetes mellitus Oral agents held Check hemoglobin A1c Moderate sliding scale  Anxiety Depression Continue home regimen of Zoloft, Depakote, Seroquel     DVT prophylaxis: Lovenox Code Status: DNR/DNI Family Communication: Fonnie Birkenhead via phone [phone] on 10/29/2020.  Left VM on 11/01/2020 Disposition Plan: Status is: Inpatient  Remains inpatient appropriate because:Unsafe d/c plan   Dispo: The patient is from: ALF              Anticipated d/c is to: ALF              Anticipated d/c date is: 1 day              Patient currently is medically stable to d/c.   Patient is medically stable for discharge at this time however assisted living facility is not accepting patients today.          Consultants:   Cardiology-[facility]  Procedures:   None  Antimicrobials:  None   Subjective: Patient seen and examined.  Breathing pattern improved.  On room air  Objective: Vitals:   10/31/20 1138 10/31/20 2000 10/31/20 2355 11/01/20 0517  BP: (!) 106/36 (!) 124/45 (!) 143/63 (!) 146/53  Pulse: 60 72 76 85  Resp: 17 16 18 20   Temp: 98.4 F (36.9 C) 98.1 F (36.7 C) 98.1 F (36.7 C) 98.4 F (36.9 C)  TempSrc: Oral  Oral   SpO2:  92% 94% 92% 91%  Weight:      Height:        Intake/Output Summary (Last 24 hours) at 11/01/2020 1004 Last data filed at 11/01/2020 0500 Gross per 24 hour  Intake 480 ml  Output 350 ml  Net 130 ml   Filed Weights   10/28/20 1454 10/30/20 0428 10/31/20 0408  Weight: 76.4 kg 69.3 kg 70.3 kg    Examination:  General exam: No acute distress.  Appears frail Respiratory system: Scattered fine crackles.  Normal work of  breathing.  Room air  cardiovascular system: S1 & S2 heard, RRR. No JVD, murmurs, rubs, gallops or clicks. No pedal edema. Gastrointestinal system: Abdomen is nondistended, soft and nontender. No organomegaly or masses felt. Normal bowel sounds heard. Central nervous system: Alert and oriented. No focal neurological deficits. Extremities: Symmetric 5 x 5 power. Skin: No rashes, lesions or ulcers Psychiatry: Judgement and insight appear normal. Mood & affect appropriate.     Data Reviewed: I have personally reviewed following labs and imaging studies  CBC: Recent Labs  Lab 10/28/20 1512 10/29/20 0328  WBC 5.0 4.9  NEUTROABS 2.8  --   HGB 10.6* 10.7*  HCT 36.4 37.3  MCV 85.6 87.6  PLT 220 180   Basic Metabolic Panel: Recent Labs  Lab 10/28/20 1512 10/29/20 0328 10/30/20 0905 10/31/20 0529 11/01/20 0440  NA 140 144 141 138 140  K 4.6 4.6 4.5 4.0 4.3  CL 108 110 102 100 102  CO2 26 26 31 29 30   GLUCOSE 141* 120* 131* 141* 142*  BUN 29* 27* 21 31* 39*  CREATININE 0.88 0.82 0.68 0.82 0.85  CALCIUM 8.8* 8.6* 9.1 9.0 9.0  MG  --   --  1.7 1.8 2.4   GFR: Estimated Creatinine Clearance: 41.6 mL/min (by C-G formula based on SCr of 0.85 mg/dL). Liver Function Tests: Recent Labs  Lab 10/28/20 1512  AST 188*  ALT 172*  ALKPHOS 138*  BILITOT 0.5  PROT 7.0  ALBUMIN 3.2*   No results for input(s): LIPASE, AMYLASE in the last 168 hours. No results for input(s): AMMONIA in the last 168 hours. Coagulation Profile: No results for input(s): INR, PROTIME in the last 168 hours. Cardiac Enzymes: No results for input(s): CKTOTAL, CKMB, CKMBINDEX, TROPONINI in the last 168 hours. BNP (last 3 results) No results for input(s): PROBNP in the last 8760 hours. HbA1C: No results for input(s): HGBA1C in the last 72 hours. CBG: Recent Labs  Lab 10/31/20 0733 10/31/20 1136 10/31/20 1716 10/31/20 2157 11/01/20 0821  GLUCAP 141* 166* 127* 137* 150*   Lipid Profile: No results  for input(s): CHOL, HDL, LDLCALC, TRIG, CHOLHDL, LDLDIRECT in the last 72 hours. Thyroid Function Tests: No results for input(s): TSH, T4TOTAL, FREET4, T3FREE, THYROIDAB in the last 72 hours. Anemia Panel: No results for input(s): VITAMINB12, FOLATE, FERRITIN, TIBC, IRON, RETICCTPCT in the last 72 hours. Sepsis Labs: Recent Labs  Lab 10/28/20 1518 10/28/20 1632  LATICACIDVEN 1.2 0.9    Recent Results (from the past 240 hour(s))  Resp Panel by RT-PCR (Flu A&B, Covid) Nasopharyngeal Swab     Status: None   Collection Time: 10/28/20  3:12 PM   Specimen: Nasopharyngeal Swab; Nasopharyngeal(NP) swabs in vial transport medium  Result Value Ref Range Status   SARS Coronavirus 2 by RT PCR NEGATIVE NEGATIVE Final    Comment: (NOTE) SARS-CoV-2 target nucleic acids are NOT DETECTED.  The SARS-CoV-2 RNA is generally detectable in upper respiratory specimens during the acute phase of  infection. The lowest concentration of SARS-CoV-2 viral copies this assay can detect is 138 copies/mL. A negative result does not preclude SARS-Cov-2 infection and should not be used as the sole basis for treatment or other patient management decisions. A negative result may occur with  improper specimen collection/handling, submission of specimen other than nasopharyngeal swab, presence of viral mutation(s) within the areas targeted by this assay, and inadequate number of viral copies(<138 copies/mL). A negative result must be combined with clinical observations, patient history, and epidemiological information. The expected result is Negative.  Fact Sheet for Patients:  BloggerCourse.com  Fact Sheet for Healthcare Providers:  SeriousBroker.it  This test is no t yet approved or cleared by the [place] FDA and  has been authorized for detection and/or diagnosis of SARS-CoV-2 by FDA under an Emergency Use Authorization (EUA). This EUA will remain  in  effect (meaning this test can be used) for the duration of the COVID-19 declaration under Section 564(b)(1) of the Act, 21 U.S.C.section 360bbb-3(b)(1), unless the authorization is terminated  or revoked sooner.       Influenza A by PCR NEGATIVE NEGATIVE Final   Influenza B by PCR NEGATIVE NEGATIVE Final    Comment: (NOTE) The Xpert Xpress SARS-CoV-2/FLU/RSV plus assay is intended as an aid in the diagnosis of influenza from Nasopharyngeal swab specimens and should not be used as a sole basis for treatment. Nasal washings and aspirates are unacceptable for Xpert Xpress SARS-CoV-2/FLU/RSV testing.  Fact Sheet for Patients: BloggerCourse.com  Fact Sheet for Healthcare Providers: SeriousBroker.it  This test is not yet approved or cleared by the [place] FDA and has been authorized for detection and/or diagnosis of SARS-CoV-2 by FDA under an Emergency Use Authorization (EUA). This EUA will remain in effect (meaning this test can be used) for the duration of the COVID-19 declaration under Section 564(b)(1) of the Act, 21 U.S.C. section 360bbb-3(b)(1), unless the authorization is terminated or revoked.  Performed at [facility], [address] [place], [place] [address]   MRSA PCR Screening     Status: None   Collection Time: 10/30/20  1:11 AM   Specimen: Nasopharyngeal  Result Value Ref Range Status   MRSA by PCR NEGATIVE NEGATIVE Final    Comment:        The GeneXpert MRSA Assay (FDA approved for NASAL specimens only), is one component of a comprehensive MRSA colonization surveillance program. It is not intended to diagnose MRSA infection nor to guide or monitor treatment for MRSA infections. Performed at [facility], [address] [place], [place] [address]          Radiology Studies: No results found.      Scheduled Meds: . amLODipine  5 mg Oral Daily  . aspirin EC  81 mg Oral  Daily  . cholecalciferol  2,000 Units Oral Daily  . COVID-19 mRNA vaccine (Pfizer)  0.3 mL Intramuscular ONCE-1600  . dicyclomine  20 mg Oral QID  . divalproex  250 mg Oral QHS  . enoxaparin (LOVENOX) injection  40 mg Subcutaneous Q24H  . feeding supplement  237 mL Oral BID BM  . furosemide  40 mg Intravenous Q12H  . insulin aspart  0-15 Units Subcutaneous TID WC  . insulin aspart  0-5 Units Subcutaneous QHS  . loratadine  10 mg Oral Daily  . metoprolol tartrate  25 mg Oral BID  . multivitamin with minerals  1 tablet Oral Daily  . olopatadine  1 drop Both Eyes Daily  . pantoprazole  40 mg Oral Daily  . QUEtiapine  12.5 mg Oral Daily  . sertraline  50 mg Oral Daily   Continuous Infusions:   LOS: 4 days    Time spent: 15 minutes    Tresa MooreSudheer B Khang Hannum, MD [facility] Pager [phone]  If 7PM-7AM, please contact night-coverage 11/01/2020, 10:04 AM

## 2020-11-02 LAB — GLUCOSE, CAPILLARY
Glucose-Capillary: 170 mg/dL — ABNORMAL HIGH (ref 70–99)
Glucose-Capillary: 145 mg/dL — ABNORMAL HIGH (ref 70–99)
Glucose-Capillary: 183 mg/dL — ABNORMAL HIGH (ref 70–99)

## 2020-11-02 LAB — BASIC METABOLIC PANEL
Anion gap: 8 (ref 5–15)
BUN: 50 mg/dL — ABNORMAL HIGH (ref 8–23)
CO2: 28 mmol/L (ref 22–32)
Calcium: 8.8 mg/dL — ABNORMAL LOW (ref 8.9–10.3)
Chloride: 102 mmol/L (ref 98–111)
Creatinine, Ser: 0.87 mg/dL (ref 0.44–1.00)
GFR, Estimated: >60 mL/min (ref >60)
Glucose, Bld: 136 mg/dL — ABNORMAL HIGH (ref 70–99)
Potassium: 4.7 mmol/L (ref 3.5–5.1)
Sodium: 138 mmol/L (ref 135–145)

## 2020-11-02 LAB — MAGNESIUM: Magnesium: 2.5 mg/dL — ABNORMAL HIGH (ref 1.7–2.4)

## 2020-11-02 NOTE — Progress Notes (Signed)
[facility] Encounter Note  Patient: [name] / Admit Date: 10/28/2020 / Date of Encounter: 11/02/2020, 8:32 AM   Subjective: 12/23.  Patient has had some slight improvements of oxygenation and shortness of breath since admission to the hospital.  The patient did have a chest x-ray showing congestive heart failure and does still have some bibasilar Rales with decreased breath sounds consistent with pleural effusion and pulmonary edema.  The patient has had some urine output which has improved her oxygenation.  Blood pressure has been relatively controlled and the patient has had no anginal symptoms or acute coronary syndrome.  12/24.  Patient has continued to have improvements with intravenous Lasix and virtually no further lower extremity edema with improved Rales of the lungs.  The patient's comfort level and oxygenation have significantly improved over the last few days.  There has been no evidence of chest discomfort or other concerns of cardiovascular disease.  Heart rate has been controlled.  12/25 overall patient has had no significant changes from yesterday but significantly improved from admission.  Breathing is good today with clear lungs and no evidence of PND orthopnea syncope dizziness nausea or diaphoresis.  There is no evidence of chest discomfort.  12/26.  Overall patient is significantly improved since admission and now lower lung fields appear to be cleared.  There has been no evidence of significant anginal symptoms and patient is starting to ambulate well.  Patient has reached her maximal hospital benefit and has been switched to oral medication management which she tolerates well  Review of Systems: Positive for: Shortness of breath Negative for: Vision change, hearing change, syncope, dizziness, nausea, vomiting,diarrhea, bloody stool, stomach pain, cough, congestion, diaphoresis, urinary frequency, urinary pain,skin lesions, skin rashes Others  previously listed  Objective: Telemetry: Normal sinus rhythm Physical Exam: Blood pressure (!) 162/55, pulse 71, temperature 99 F (37.2 C), temperature source Oral, resp. rate 16, height 5' (1.524 m), weight 69.3 kg, SpO2 92 %. Body mass index is 29.82 kg/m. General: Well developed, well nourished, in no acute distress. Head: Normocephalic, atraumatic, sclera non-icteric, no xanthomas, nares are without discharge. Neck: No apparent masses Lungs: Normal respirations with no wheezes, no rhonchi, bibasilar rales , some crackles decreased breath sounds of the base on the left side  Heart: Regular rate and rhythm, normal S1 S2, no murmur, no rub, no gallop, PMI is normal size and placement, carotid upstroke normal without bruit, jugular venous pressure normal Abdomen: Soft, non-tender, non-distended with normoactive bowel sounds. No hepatosplenomegaly. Abdominal aorta is normal size without bruit Extremities: Trace edema, no clubbing, no cyanosis, no ulcers,  Peripheral: 2+ radial, 2+ femoral, 2+ dorsal pedal pulses Neuro: Alert and oriented. Moves all extremities spontaneously. Psych:  Responds to questions appropriately with a normal affect.   Intake/Output Summary (Last 24 hours) at 11/02/2020 0832 Last data filed at 11/02/2020 7829 Gross per 24 hour  Intake 480 ml  Output 1050 ml  Net -570 ml    Inpatient Medications:  . amLODipine  5 mg Oral Daily  . aspirin EC  81 mg Oral Daily  . cholecalciferol  2,000 Units Oral Daily  . dicyclomine  20 mg Oral QID  . divalproex  250 mg Oral QHS  . enoxaparin (LOVENOX) injection  40 mg Subcutaneous Q24H  . feeding supplement  237 mL Oral BID BM  . furosemide  40 mg Oral Daily  . insulin aspart  0-15 Units Subcutaneous TID WC  . insulin aspart  0-5 Units Subcutaneous QHS  .  loratadine  10 mg Oral Daily  . metoprolol tartrate  25 mg Oral BID  . multivitamin with minerals  1 tablet Oral Daily  . olopatadine  1 drop Both Eyes Daily  .  pantoprazole  40 mg Oral Daily  . QUEtiapine  12.5 mg Oral Daily  . sertraline  50 mg Oral Daily   Infusions:    Labs: Recent Labs    11/01/20 0440 11/02/20 0601  NA 140 138  K 4.3 4.7  CL 102 102  CO2 30 28  GLUCOSE 142* 136*  BUN 39* 50*  CREATININE 0.85 0.87  CALCIUM 9.0 8.8*  MG 2.4 2.5*   No results for input(s): AST, ALT, ALKPHOS, BILITOT, PROT, ALBUMIN in the last 72 hours. No results for input(s): WBC, NEUTROABS, HGB, HCT, MCV, PLT in the last 72 hours. No results for input(s): CKTOTAL, CKMB, TROPONINI in the last 72 hours. Invalid input(s): POCBNP No results for input(s): HGBA1C in the last 72 hours.   Weights: Filed Weights   10/30/20 0428 10/31/20 0408 11/01/20 2007  Weight: 69.3 kg 70.3 kg 69.3 kg     Radiology/Studies:  CT Head Wo Contrast  Result Date: 10/09/2020 CLINICAL DATA:  Fall, head injury EXAM: CT HEAD WITHOUT CONTRAST CT CERVICAL SPINE WITHOUT CONTRAST TECHNIQUE: Multidetector CT imaging of the head and cervical spine was performed following the standard protocol without intravenous contrast. Multiplanar CT image reconstructions of the cervical spine were also generated. COMPARISON:  CT head 06/06/2019 FINDINGS: CT HEAD FINDINGS Brain: Normal anatomic configuration. Parenchymal volume loss is commensurate with the patient's age. Moderate periventricular white matter changes are present likely reflecting the sequela of small vessel ischemia. Remote lacunar infarcts are noted within the a left corona radiata, right putamen, and medial right cerebellar hemisphere. Right putaminal infarct is new since prior examination. No abnormal intra or extra-axial mass lesion or fluid collection. No abnormal mass effect or midline shift. No evidence of acute intracranial hemorrhage or infarct. Ventricular size is normal. Cerebellum unremarkable. Vascular: No asymmetric hyperdense vasculature at the skull base. Skull: Intact Sinuses/Orbits: Paranasal sinuses are clear.  Orbits are unremarkable. Other: There is fluid opacification of a few inferior left mastoid air cells, nonspecific. Mastoid air cells and middle ear cavities are otherwise clear. Small occipital scalp hematoma noted. CT CERVICAL SPINE FINDINGS Assessment of the cervical spine is moderately limited by motion artifact Alignment: Normal cervical lordosis.  No listhesis. Skull base and vertebrae: The craniocervical junction is unremarkable. The atlantodental interval is normal. No acute fracture of the cervical spine. Soft tissues and spinal canal: No prevertebral fluid or swelling. No visible canal hematoma. Disc levels: Review of the sagittal reformats demonstrates preservation of cervical lordosis. A remote compression fracture of C6 is identified with approximately 50% loss of height. Remaining vertebral body height has been preserved. There is intervertebral disc space narrowing and endplate remodeling at C6-7 with associated vacuum disc phenomena in keeping with changes of severe degenerative disc disease. Milder degenerative changes are noted at C5-6. Review of the a axial images demonstrates multilevel moderate to severe uncovertebral and facet arthrosis resulting in multilevel moderate to severe neural foraminal narrowing throughout the cervical spine and mild central canal stenosis at C3-4 and C5-6. Upper chest: Unremarkable Other: There is asymmetric enlargement and heterogeneous attenuation of the left thyroid lobe IMPRESSION: No acute intracranial injury. No calvarial fracture. Small occipital scalp hematoma. Multiple remote infarcts with interval development of a right putaminal infarct since prior examination of 06/06/2019. No acute fracture or listhesis  of the cervical spine, allowing for moderate limitation by motion artifact. Multilevel severe uncovertebral and facet arthrosis resulting in multilevel moderate to severe neural foraminal narrowing. Remote C6 compression fracture with approximately 50%  loss of height. Asymmetric enlargement of the left thyroid lobe. This could be better assessed with dedicated thyroid sonography if indicated. Electronically Signed   By: Helyn NumbersAshesh  Parikh MD   On: 10/09/2020 00:25   CT Cervical Spine Wo Contrast  Result Date: 10/09/2020 CLINICAL DATA:  Fall, head injury EXAM: CT HEAD WITHOUT CONTRAST CT CERVICAL SPINE WITHOUT CONTRAST TECHNIQUE: Multidetector CT imaging of the head and cervical spine was performed following the standard protocol without intravenous contrast. Multiplanar CT image reconstructions of the cervical spine were also generated. COMPARISON:  CT head 06/06/2019 FINDINGS: CT HEAD FINDINGS Brain: Normal anatomic configuration. Parenchymal volume loss is commensurate with the patient's age. Moderate periventricular white matter changes are present likely reflecting the sequela of small vessel ischemia. Remote lacunar infarcts are noted within the a left corona radiata, right putamen, and medial right cerebellar hemisphere. Right putaminal infarct is new since prior examination. No abnormal intra or extra-axial mass lesion or fluid collection. No abnormal mass effect or midline shift. No evidence of acute intracranial hemorrhage or infarct. Ventricular size is normal. Cerebellum unremarkable. Vascular: No asymmetric hyperdense vasculature at the skull base. Skull: Intact Sinuses/Orbits: Paranasal sinuses are clear. Orbits are unremarkable. Other: There is fluid opacification of a few inferior left mastoid air cells, nonspecific. Mastoid air cells and middle ear cavities are otherwise clear. Small occipital scalp hematoma noted. CT CERVICAL SPINE FINDINGS Assessment of the cervical spine is moderately limited by motion artifact Alignment: Normal cervical lordosis.  No listhesis. Skull base and vertebrae: The craniocervical junction is unremarkable. The atlantodental interval is normal. No acute fracture of the cervical spine. Soft tissues and spinal canal: No  prevertebral fluid or swelling. No visible canal hematoma. Disc levels: Review of the sagittal reformats demonstrates preservation of cervical lordosis. A remote compression fracture of C6 is identified with approximately 50% loss of height. Remaining vertebral body height has been preserved. There is intervertebral disc space narrowing and endplate remodeling at C6-7 with associated vacuum disc phenomena in keeping with changes of severe degenerative disc disease. Milder degenerative changes are noted at C5-6. Review of the a axial images demonstrates multilevel moderate to severe uncovertebral and facet arthrosis resulting in multilevel moderate to severe neural foraminal narrowing throughout the cervical spine and mild central canal stenosis at C3-4 and C5-6. Upper chest: Unremarkable Other: There is asymmetric enlargement and heterogeneous attenuation of the left thyroid lobe IMPRESSION: No acute intracranial injury. No calvarial fracture. Small occipital scalp hematoma. Multiple remote infarcts with interval development of a right putaminal infarct since prior examination of 06/06/2019. No acute fracture or listhesis of the cervical spine, allowing for moderate limitation by motion artifact. Multilevel severe uncovertebral and facet arthrosis resulting in multilevel moderate to severe neural foraminal narrowing. Remote C6 compression fracture with approximately 50% loss of height. Asymmetric enlargement of the left thyroid lobe. This could be better assessed with dedicated thyroid sonography if indicated. Electronically Signed   By: Helyn NumbersAshesh  Parikh MD   On: 10/09/2020 00:25   DG Chest Portable 1 View  Result Date: 10/28/2020 CLINICAL DATA:  Dizziness and weakness, initial encounter EXAM: PORTABLE CHEST 1 VIEW COMPARISON:  10/17/2019 FINDINGS: Cardiac shadow is enlarged. Aortic calcifications are seen. Mild vascular congestion is noted with mild interstitial edema. No focal confluent infiltrate is seen. No  bony abnormality  is noted. IMPRESSION: CHF. Electronically Signed   By: Alcide Clever M.D.   On: 10/28/2020 16:38   ECHOCARDIOGRAM COMPLETE  Result Date: 10/29/2020    ECHOCARDIOGRAM REPORT   Patient Name:   [name] Date of Exam: 10/29/2020 Medical Rec #:  [id number]      Height:       60.0 in Accession #:    [id number]     Weight:       168.5 lb Date of Birth:  1931-12-21     BSA:          1.735 m Patient Age:    86 years       BP:           143/68 mmHg Patient Gender: F              HR:           62 bpm. Exam Location:  [facility] Procedure: 2D Echo, Color Doppler, Cardiac Doppler and Intracardiac            Opacification Agent Indications:     I50.31 CHF-Acute Diastolic  History:         Patient has prior history of Echocardiogram examinations. COPD;                  Risk Factors:Dyslipidemia and Diabetes.  Sonographer:     Humphrey Rolls RDCS (AE) Referring Phys:  [id number] Vernetta Honey MANSY Diagnosing Phys: Marcina Millard MD  Sonographer Comments: Suboptimal apical window and no subcostal window. Image acquisition challenging due to patient body habitus and Image acquisition challenging due to COPD. IMPRESSIONS  1. Left ventricular ejection fraction, by estimation, is 60 to 65%. The left ventricle has normal function. The left ventricle has no regional wall motion abnormalities. Left ventricular diastolic parameters were normal.  2. Right ventricular systolic function is normal. The right ventricular size is normal.  3. The mitral valve is normal in structure. Mild mitral valve regurgitation. No evidence of mitral stenosis.  4. The aortic valve is normal in structure. Aortic valve regurgitation is not visualized. No aortic stenosis is present.  5. The inferior vena cava is normal in size with greater than 50% respiratory variability, suggesting right atrial pressure of 3 mmHg. FINDINGS  Left Ventricle: Left ventricular ejection fraction, by estimation, is 60 to 65%. The left ventricle has normal function. The  left ventricle has no regional wall motion abnormalities. Definity contrast agent was given IV to delineate the left ventricular  endocardial borders. The left ventricular internal cavity size was normal in size. There is no left ventricular hypertrophy. Left ventricular diastolic parameters were normal. Right Ventricle: The right ventricular size is normal. No increase in right ventricular wall thickness. Right ventricular systolic function is normal. Left Atrium: Left atrial size was normal in size. Right Atrium: Right atrial size was normal in size. Pericardium: There is no evidence of pericardial effusion. Mitral Valve: The mitral valve is normal in structure. Mild mitral valve regurgitation. No evidence of mitral valve stenosis. MV peak gradient, 7.6 mmHg. The mean mitral valve gradient is 2.0 mmHg. Tricuspid Valve: The tricuspid valve is normal in structure. Tricuspid valve regurgitation is mild . No evidence of tricuspid stenosis. Aortic Valve: The aortic valve is normal in structure. Aortic valve regurgitation is not visualized. No aortic stenosis is present. Aortic valve mean gradient measures 4.0 mmHg. Aortic valve peak gradient measures 8.0 mmHg. Aortic valve area, by VTI measures 1.80 cm. Pulmonic Valve: The pulmonic valve was  normal in structure. Pulmonic valve regurgitation is not visualized. No evidence of pulmonic stenosis. Aorta: The aortic root is normal in size and structure. Venous: The inferior vena cava is normal in size with greater than 50% respiratory variability, suggesting right atrial pressure of 3 mmHg. IAS/Shunts: No atrial level shunt detected by color flow Doppler.  LEFT VENTRICLE PLAX 2D LVIDd:         3.60 cm  Diastology LVIDs:         2.20 cm  LV e' lateral:   6.31 cm/s LV PW:         1.24 cm  LV E/e' lateral: 20.1 LV IVS:        0.89 cm LVOT diam:     1.90 cm LV SV:         46 LV SV Index:   27 LVOT Area:     2.84 cm  RIGHT VENTRICLE RV Basal diam:  3.02 cm LEFT ATRIUM            Index       RIGHT ATRIUM           Index LA diam:      4.30 cm 2.48 cm/m  RA Area:     14.60 cm LA Vol (A4C): 94.5 ml 54.45 ml/m RA Volume:   36.00 ml  20.74 ml/m  AORTIC VALVE                   PULMONIC VALVE AV Area (Vmax):    1.91 cm    PV Vmax:       1.21 m/s AV Area (Vmean):   1.81 cm    PV Vmean:      83.700 cm/s AV Area (VTI):     1.80 cm    PV VTI:        0.236 m AV Vmax:           141.00 cm/s PV Peak grad:  5.9 mmHg AV Vmean:          93.500 cm/s PV Mean grad:  3.0 mmHg AV VTI:            0.258 m AV Peak Grad:      8.0 mmHg AV Mean Grad:      4.0 mmHg LVOT Vmax:         95.10 cm/s LVOT Vmean:        59.700 cm/s LVOT VTI:          0.164 m LVOT/AV VTI ratio: 0.64  AORTA Ao Root diam: 2.90 cm MITRAL VALVE MV Area (PHT): 2.12 cm     SHUNTS MV Peak grad:  7.6 mmHg     Systemic VTI:  0.16 m MV Mean grad:  2.0 mmHg     Systemic Diam: 1.90 cm MV Vmax:       1.38 m/s MV Vmean:      60.7 cm/s MV Decel Time: 358 msec MV E velocity: 127.00 cm/s MV A velocity: 51.40 cm/s MV E/A ratio:  2.47 Marcina Millard MD Electronically signed by Marcina Millard MD Signature Date/Time: 10/29/2020/1:13:25 PM    Final      Assessment and Recommendation  86 y.o. female with known diabetes hypertension hyperlipidemia and abnormal EKG with acute diastolic dysfunction congestive heart failure without evidence of myocardial infarction and/or acute coronary syndrome 1.  Continuation of oral furosemide for congestive heart failure and pulmonary edema 2.  No further cardiac diagnostics necessary at this time due to echocardiogram showing normal  LV systolic function with no evidence of significant valvular heart disease 3.  Hypertension control with amlodipine metoprolol combination without change today 4.  Further supportive care other concerns and would continue to follow with ambulation for potential for discharge home and/or outside facility today if able with follow-up next week.  Call if further  questions  Signed, Arnoldo Hooker M.D. FACC

## 2020-11-02 NOTE — Progress Notes (Signed)
PROGRESS NOTE    [name]  PRF:[id number] DOB: April 15, 1932 DOA: 10/28/2020 PCP: Lauro Regulus, MD   Brief Narrative:  86 y.o. Caucasian female with a known history of COPD, anxiety, depression, type 2 diabetes mellitus, GERD and dyslipidemia who presented to the emergency room with acute onset of dizziness with generalized weakness that started today.  She admits to dyspnea on exertion as well as orthopnea and paroxysmal nocturnal dyspnea.  No chest pain or palpitations.  No current nausea or vomiting or abdominal pain. No dysuria, oliguria or hematuria or flank pain.  12/22: Started on intravenous Lasix for acute decompensated heart failure suspected.  Symptomatically improved.  No pain complaints on my evaluation today.  Cardiology evaluation appreciated.  12/23: Patient symptomatically improved.  Lost IV access last night.  Now agreeable to replacement.  Net -1 L  12/24: Leg edema improved.  Shortness of breath improved.  Patient still has some mild pursed lip breathing notable on exam.  12/25: Breathing improved.  Patient on room air.  Relayed to me that she would be willing to go back to assisted living facility however this is not her preferred option as apparently she has some issues with staff there in the past.  I have communicated her concerns to [facility].  12/26: Large soft BM this morning.  Patient endorse diarrhea however per RN bowel movement was soft.   Assessment & Plan:   Active Problems:   Acute CHF (congestive heart failure) (HCC)   Acute decompensated heart failure (HCC)  Acute on chronic diastolic congestive heart failure Acute hypoxic respiratory failure, secondary to above.  Resolved Patient started on IV Lasix with improvement in volume status Weaned off supplemental oxygen Cardiology recommendations appreciated Echocardiogram with grade 1 diastolic dysfunction and normal ejection fraction Ins and outs not accurately recorded Plan: Continue  oral Lasix 40 mg daily Monitor renal function and electrolytes Strict I's and O's Daily weights Patient medically stable for discharge today however ALF unable to take patients today due to holiday staffing issues.  Essential hypertension Continue Norvasc Continue beta-blockade  Type 2 diabetes mellitus Oral agents held Check hemoglobin A1c Moderate sliding scale  Anxiety Depression Continue home regimen of Zoloft, Depakote, Seroquel     DVT prophylaxis: Lovenox Code Status: DNR/DNI Family Communication: Fonnie Birkenhead via phone [phone] on 11/02/2020.  Left VM on 11/01/2020 Disposition Plan: Status is: Inpatient  Remains inpatient appropriate because:Unsafe d/c plan   Dispo: The patient is from: ALF              Anticipated d/c is to: ALF              Anticipated d/c date is: 1 day              Patient currently is medically stable to d/c.   Patient is medically stable for discharge at this time however assisted living facility is not accepting patients today.          Consultants:   Cardiology-[facility]  Procedures:   None  Antimicrobials:  None   Subjective: Patient seen and examined.  Off supplemental oxygen.  No complaints  Objective: Vitals:   11/01/20 2007 11/01/20 2353 11/02/20 0609 11/02/20 1101  BP: (!) 134/42 (!) 130/50 (!) 162/55 (!) 125/53  Pulse: 63 66 71 66  Resp: 20 18 16 18   Temp: 98.1 F (36.7 C) 98.6 F (37 C) 99 F (37.2 C) (!) 97.5 F (36.4 C)  TempSrc: Oral  Oral Oral  SpO2:  94% 96% 92% 96%  Weight: 69.3 kg     Height:        Intake/Output Summary (Last 24 hours) at 11/02/2020 1104 Last data filed at 11/02/2020 1034 Gross per 24 hour  Intake 720 ml  Output 1050 ml  Net -330 ml   Filed Weights   10/30/20 0428 10/31/20 0408 11/01/20 2007  Weight: 69.3 kg 70.3 kg 69.3 kg    Examination:  General exam: No acute distress.  Appears frail Respiratory system: Scattered fine crackles.  Normal work of  breathing.  Room air  cardiovascular system: S1 & S2 heard, RRR. No JVD, murmurs, rubs, gallops or clicks. No pedal edema. Gastrointestinal system: Abdomen is nondistended, soft and nontender. No organomegaly or masses felt. Normal bowel sounds heard. Central nervous system: Alert and oriented. No focal neurological deficits. Extremities: Symmetric 5 x 5 power. Skin: No rashes, lesions or ulcers Psychiatry: Judgement and insight appear normal. Mood & affect appropriate.     Data Reviewed: I have personally reviewed following labs and imaging studies  CBC: Recent Labs  Lab 10/28/20 1512 10/29/20 0328  WBC 5.0 4.9  NEUTROABS 2.8  --   HGB 10.6* 10.7*  HCT 36.4 37.3  MCV 85.6 87.6  PLT 220 180   Basic Metabolic Panel: Recent Labs  Lab 10/29/20 0328 10/30/20 0905 10/31/20 0529 11/01/20 0440 11/02/20 0601  NA 144 141 138 140 138  K 4.6 4.5 4.0 4.3 4.7  CL 110 102 100 102 102  CO2 26 31 29 30 28   GLUCOSE 120* 131* 141* 142* 136*  BUN 27* 21 31* 39* 50*  CREATININE 0.82 0.68 0.82 0.85 0.87  CALCIUM 8.6* 9.1 9.0 9.0 8.8*  MG  --  1.7 1.8 2.4 2.5*   GFR: Estimated Creatinine Clearance: 40.3 mL/min (by C-G formula based on SCr of 0.87 mg/dL). Liver Function Tests: Recent Labs  Lab 10/28/20 1512  AST 188*  ALT 172*  ALKPHOS 138*  BILITOT 0.5  PROT 7.0  ALBUMIN 3.2*   No results for input(s): LIPASE, AMYLASE in the last 168 hours. No results for input(s): AMMONIA in the last 168 hours. Coagulation Profile: No results for input(s): INR, PROTIME in the last 168 hours. Cardiac Enzymes: No results for input(s): CKTOTAL, CKMB, CKMBINDEX, TROPONINI in the last 168 hours. BNP (last 3 results) No results for input(s): PROBNP in the last 8760 hours. HbA1C: No results for input(s): HGBA1C in the last 72 hours. CBG: Recent Labs  Lab 10/31/20 1716 10/31/20 2157 11/01/20 0821 11/01/20 1705 11/01/20 2030  GLUCAP 127* 137* 150* 153* 131*   Lipid Profile: No results  for input(s): CHOL, HDL, LDLCALC, TRIG, CHOLHDL, LDLDIRECT in the last 72 hours. Thyroid Function Tests: No results for input(s): TSH, T4TOTAL, FREET4, T3FREE, THYROIDAB in the last 72 hours. Anemia Panel: No results for input(s): VITAMINB12, FOLATE, FERRITIN, TIBC, IRON, RETICCTPCT in the last 72 hours. Sepsis Labs: Recent Labs  Lab 10/28/20 1518 10/28/20 1632  LATICACIDVEN 1.2 0.9    Recent Results (from the past 240 hour(s))  Resp Panel by RT-PCR (Flu A&B, Covid) Nasopharyngeal Swab     Status: None   Collection Time: 10/28/20  3:12 PM   Specimen: Nasopharyngeal Swab; Nasopharyngeal(NP) swabs in vial transport medium  Result Value Ref Range Status   SARS Coronavirus 2 by RT PCR NEGATIVE NEGATIVE Final    Comment: (NOTE) SARS-CoV-2 target nucleic acids are NOT DETECTED.  The SARS-CoV-2 RNA is generally detectable in upper respiratory specimens during the acute phase of infection.  The lowest concentration of SARS-CoV-2 viral copies this assay can detect is 138 copies/mL. A negative result does not preclude SARS-Cov-2 infection and should not be used as the sole basis for treatment or other patient management decisions. A negative result may occur with  improper specimen collection/handling, submission of specimen other than nasopharyngeal swab, presence of viral mutation(s) within the areas targeted by this assay, and inadequate number of viral copies(<138 copies/mL). A negative result must be combined with clinical observations, patient history, and epidemiological information. The expected result is Negative.  Fact Sheet for Patients:  BloggerCourse.com  Fact Sheet for Healthcare Providers:  SeriousBroker.it  This test is no t yet approved or cleared by the [place] FDA and  has been authorized for detection and/or diagnosis of SARS-CoV-2 by FDA under an Emergency Use Authorization (EUA). This EUA will remain  in  effect (meaning this test can be used) for the duration of the COVID-19 declaration under Section 564(b)(1) of the Act, 21 U.S.C.section 360bbb-3(b)(1), unless the authorization is terminated  or revoked sooner.       Influenza A by PCR NEGATIVE NEGATIVE Final   Influenza B by PCR NEGATIVE NEGATIVE Final    Comment: (NOTE) The Xpert Xpress SARS-CoV-2/FLU/RSV plus assay is intended as an aid in the diagnosis of influenza from Nasopharyngeal swab specimens and should not be used as a sole basis for treatment. Nasal washings and aspirates are unacceptable for Xpert Xpress SARS-CoV-2/FLU/RSV testing.  Fact Sheet for Patients: BloggerCourse.com  Fact Sheet for Healthcare Providers: SeriousBroker.it  This test is not yet approved or cleared by the [place] FDA and has been authorized for detection and/or diagnosis of SARS-CoV-2 by FDA under an Emergency Use Authorization (EUA). This EUA will remain in effect (meaning this test can be used) for the duration of the COVID-19 declaration under Section 564(b)(1) of the Act, 21 U.S.C. section 360bbb-3(b)(1), unless the authorization is terminated or revoked.  Performed at [facility], [address] [place], [place] [address]   MRSA PCR Screening     Status: None   Collection Time: 10/30/20  1:11 AM   Specimen: Nasopharyngeal  Result Value Ref Range Status   MRSA by PCR NEGATIVE NEGATIVE Final    Comment:        The GeneXpert MRSA Assay (FDA approved for NASAL specimens only), is one component of a comprehensive MRSA colonization surveillance program. It is not intended to diagnose MRSA infection nor to guide or monitor treatment for MRSA infections. Performed at [facility], [address], [place], [place] [address]          Radiology Studies: No results found.      Scheduled Meds: . amLODipine  5 mg Oral Daily  . aspirin EC  81 mg Oral  Daily  . cholecalciferol  2,000 Units Oral Daily  . dicyclomine  20 mg Oral QID  . divalproex  250 mg Oral QHS  . enoxaparin (LOVENOX) injection  40 mg Subcutaneous Q24H  . feeding supplement  237 mL Oral BID BM  . furosemide  40 mg Oral Daily  . insulin aspart  0-15 Units Subcutaneous TID WC  . insulin aspart  0-5 Units Subcutaneous QHS  . loratadine  10 mg Oral Daily  . metoprolol tartrate  25 mg Oral BID  . multivitamin with minerals  1 tablet Oral Daily  . olopatadine  1 drop Both Eyes Daily  . pantoprazole  40 mg Oral Daily  . QUEtiapine  12.5 mg Oral  Daily  . sertraline  50 mg Oral Daily   Continuous Infusions:   LOS: 5 days    Time spent: 15 minutes    Tresa MooreSudheer B Jaslen Adcox, MD [facility] Pager [phone]  If 7PM-7AM, please contact night-coverage 11/02/2020, 11:04 AM

## 2020-11-02 NOTE — TOC Progression Note (Signed)
Transition of Care [facility]) - Progression Note    Patient Details  Name: [name] MRN: [id number] Date of Birth: 1932/08/21  Transition of Care [facility]) CM/SW Contact  Eilleen Kempf, LCSW Phone Number: 11/02/2020, 1:22 PM  Clinical Narrative:    CSW called ALF, not able to accept patient back today due to [place] staffing. CSW confirmed with Liborio Nixon they will plan to transport patient back to facility Monday Morning.         Expected Discharge Plan and Services                                                 Social Determinants of Health (SDOH) Interventions    Readmission Risk Interventions Readmission Risk Prevention Plan 05/28/2019 05/27/2019 05/24/2019  Transportation Screening Complete Complete Complete  Social Work Consult for Recovery Care Planning/Counseling - Complete -  Medication Review Oceanographer) Complete Complete Complete  PCP or Specialist appointment within 3-5 days of discharge (No Data) - -  HRI or Home Care Consult Complete - -  Palliative Care Screening Not Applicable - -  Skilled Nursing Facility Not Applicable - -  Some recent data might be hidden

## 2020-11-03 LAB — BASIC METABOLIC PANEL
Anion gap: 5 (ref 5–15)
BUN: 45 mg/dL — ABNORMAL HIGH (ref 8–23)
CO2: 30 mmol/L (ref 22–32)
Calcium: 8.9 mg/dL (ref 8.9–10.3)
Chloride: 103 mmol/L (ref 98–111)
Creatinine, Ser: 0.8 mg/dL (ref 0.44–1.00)
GFR, Estimated: >60 mL/min (ref >60)
Glucose, Bld: 152 mg/dL — ABNORMAL HIGH (ref 70–99)
Potassium: 4.4 mmol/L (ref 3.5–5.1)
Sodium: 138 mmol/L (ref 135–145)

## 2020-11-03 LAB — GLUCOSE, CAPILLARY: Glucose-Capillary: 159 mg/dL — ABNORMAL HIGH (ref 70–99)

## 2020-11-03 LAB — MAGNESIUM: Magnesium: 2.4 mg/dL (ref 1.7–2.4)

## 2020-11-03 MED ORDER — FUROSEMIDE 40 MG PO TABS: 40.0000 mg | ORAL_TABLET | Freq: Every day | ORAL | 0 refills | Status: DC

## 2020-11-03 NOTE — Care Management Important Message (Signed)
Important Message  Patient Details  Name: [name] MRN: [id number] Date of Birth: 1932-07-02   Medicare Important Message Given:  Yes     Johnell Comings 11/03/2020, 12:15 PM

## 2020-11-03 NOTE — NC FL2 (Signed)
[place] MEDICAID FL2 LEVEL OF CARE SCREENING TOOL     IDENTIFICATION  Patient Name: [name] Birthdate: 04-Jun-1932 Sex: female Admission Date (Current Location): 10/28/2020  [place] and [place] Number:  Chiropodist and Address:  [facility], [address] [place], [place] [address]      Provider Number: [id number]  Attending Physician Name and Address:  Tresa Moore, MD  Relative Name and Phone Number:       Current Level of Care: Hospital Recommended Level of Care: Memory Care,Assisted Living Facility Prior Approval Number:    Date Approved/Denied:   PASRR Number:    Discharge Plan: Other (Comment) (Memory Care)    Current Diagnoses: Alzheimer/Dementia   Patient Active Problem List   Diagnosis Date Noted  . Acute decompensated heart failure (HCC) 10/29/2020  . Acute CHF (congestive heart failure) (HCC) 10/28/2020  . Pneumonia due to COVID-19 virus 10/16/2019  . Elevated brain natriuretic peptide (BNP) level 10/16/2019  . Lactic acidosis 10/16/2019  . Acute on chronic respiratory failure with hypoxia (HCC) 10/15/2019  . Suspected COVID-19 virus infection 10/15/2019  . HTN (hypertension) 10/15/2019  . Chronic diastolic (congestive) heart failure (HCC) 10/15/2019  . Hyperkalemia 10/15/2019  . CKD (chronic kidney disease), stage IIIa 10/15/2019  . Hypoxia 05/23/2019  . Acute delirium 08/16/2018  . Dementia with behavioral disturbance (HCC) 08/16/2018  . Encephalopathy acute 08/11/2018  . Sepsis (HCC) 05/21/2017  . Aspiration pneumonia (HCC) 05/21/2017  . GERD (gastroesophageal reflux disease) 05/21/2017  . HLD (hyperlipidemia) 05/21/2017  . Depression 05/21/2017  . Anxiety 05/21/2017  . Elevated troponin 05/21/2017  . Diabetes (HCC) 05/21/2017  . Right humeral fracture 05/21/2017  . Nausea and vomiting 03/31/2015  . Microcytic anemia 03/31/2015    Orientation RESPIRATION BLADDER Height & Weight      Self,Place,Time  Normal Continent Weight: 69.3 kg Height:  5' (152.4 cm)  BEHAVIORAL SYMPTOMS/MOOD NEUROLOGICAL BOWEL NUTRITION STATUS      Continent Diet (2 gram sodium)  AMBULATORY STATUS COMMUNICATION OF NEEDS Skin   Limited Assist Verbally Normal                       Personal Care Assistance Level of Assistance  Bathing,Feeding,Dressing Bathing Assistance: Limited assistance Feeding assistance: Independent Dressing Assistance: Limited assistance     Functional Limitations Info  Sight,Hearing,Speech Sight Info: Adequate Hearing Info: Adequate Speech Info: Adequate    SPECIAL CARE FACTORS FREQUENCY  PT (By licensed PT),OT (By licensed OT)     PT Frequency: 2x OT Frequency: 2x            Contractures Contractures Info: Not present    Additional Factors Info  Code Status,Allergies Code Status Info: DNR Allergies Info: Ampicillin, Sulfa Antibiotics           Medication List    TAKE these medications   acetaminophen 500 MG tablet Commonly known as: TYLENOL Take 1,000 mg by mouth 3 (three) times daily.   alum & mag hydroxide-simeth 200-200-20 MG/5ML suspension Commonly known as: MAALOX/MYLANTA Take 30 mLs by mouth as needed for indigestion or heartburn.   amLODipine 5 MG tablet Commonly known as: NORVASC Take 5 mg by mouth daily.   aspirin EC 81 MG tablet Take 81 mg by mouth daily.   bismuth subsalicylate 262 MG/15ML suspension Commonly known as: PEPTO BISMOL Take 262 mg by mouth every 2 (two) hours as needed for indigestion (up to 6 doses in 24 hr).   calcium carbonate 500  MG chewable tablet Commonly known as: TUMS - dosed in mg elemental calcium Chew 2 tablets (400 mg of elemental calcium total) by mouth 3 (three) times daily as needed for indigestion or heartburn.   dicyclomine 20 MG tablet Commonly known as: BENTYL Take 20 mg by mouth 4 (four) times daily.   divalproex 250 MG 24 hr tablet Commonly known as: DEPAKOTE ER Take  250 mg by mouth at bedtime.   esomeprazole 20 MG capsule Commonly known as: NEXIUM Take 20 mg by mouth daily.   feeding supplement Liqd Take 237 mLs by mouth 2 (two) times daily between meals.   furosemide 40 MG tablet Commonly known as: LASIX Take 1 tablet (40 mg total) by mouth daily. What changed:   medication strength  how much to take   hydrocortisone cream 1 % Apply 1 application topically every 6 (six) hours as needed for itching.   insulin aspart 100 UNIT/ML injection Commonly known as: novoLOG 0-15 Units, Subcutaneous, 3 times daily with meals, CBG < 70: Implement Hypoglycemia measures CBG 70 - 120: 0 units CBG 121 - 150: 2 units CBG 151 - 200: 3 units CBG 201 - 250: 5 units CBG 251 - 300: 8 units CBG 301 - 350: 11 units CBG 351 - 400: 15 units CBG > 400: call MD   ipratropium-albuterol 0.5-2.5 (3) MG/3ML Soln Commonly known as: DUONEB Take 3 mLs by nebulization 2 (two) times daily.   ketotifen 0.025 % ophthalmic solution Commonly known as: ZADITOR Place 1 drop into both eyes 2 (two) times daily.   loperamide 2 MG tablet Commonly known as: IMODIUM A-D Take 2 mg by mouth as needed for diarrhea or loose stools (up to 4 doses in 12 hrs.).   loratadine 10 MG tablet Commonly known as: CLARITIN Take 10 mg by mouth daily.   magnesium hydroxide 400 MG/5ML suspension Commonly known as: MILK OF MAGNESIA Take 30 mLs by mouth daily as needed for mild constipation.   metFORMIN 500 MG 24 hr tablet Commonly known as: GLUCOPHAGE-XR Take 500 mg by mouth 2 (two) times a day.   metoprolol tartrate 25 MG tablet Commonly known as: LOPRESSOR Take 1 tablet (25 mg total) by mouth 2 (two) times daily.   multivitamin with minerals Tabs tablet Take 1 tablet by mouth daily.   nystatin powder Commonly known as: MYCOSTATIN/NYSTOP Apply 1 g topically 2 (two) times daily as needed (to affected area(s)).   ofloxacin 0.3 % ophthalmic solution Commonly known  as: OCUFLOX Place 1 drop into both eyes 4 (four) times daily.   olopatadine 0.1 % ophthalmic solution Commonly known as: PATANOL Place 1 drop into both eyes daily.   ondansetron 4 MG tablet Commonly known as: ZOFRAN Take 4 mg by mouth every 8 (eight) hours as needed for nausea or vomiting.   psyllium 58.6 % powder Commonly known as: METAMUCIL Take 1 packet by mouth 2 (two) times a day.   QUEtiapine 25 MG tablet Commonly known as: SEROQUEL Take 1 tablet (25 mg total) by mouth at bedtime as needed (psychosis, insomnia). What changed: when to take this   ROBITUSSIN CF PO Take 10 mLs by mouth every 6 (six) hours as needed (cough).   sertraline 50 MG tablet Commonly known as: ZOLOFT Take 50 mg by mouth daily.   sodium phosphate Pediatric 3.5-9.5 GM/59ML enema Place 1 enema rectally once as needed for severe constipation.   Vitamin D 50 MCG (2000 UT) tablet Take 2,000 Units by mouth daily.  Relevant Imaging Results:  Relevant Lab Results:   Additional Information SSN; [id number]  Chapman Fitch, RN

## 2020-11-03 NOTE — TOC Progression Note (Signed)
Transition of Care [facility]) - Progression Note    Patient Details  Name: [name] MRN: [id number] Date of Birth: January 10, 1932  Transition of Care [facility]) CM/SW Contact  Chapman Fitch, RN Phone Number: 11/03/2020, 9:02 AM  Clinical Narrative:     VM left for Liborio Nixon at [place] to notify her of discharge.  It was reported that [facility] would be transporting patient today.   Milbert Coulter from [facility] notified of discharge       Expected Discharge Plan and Services           Expected Discharge Date: 11/03/20                                     Social Determinants of Health (SDOH) Interventions    Readmission Risk Interventions Readmission Risk Prevention Plan 05/28/2019 05/27/2019 05/24/2019  Transportation Screening Complete Complete Complete  Social Work Consult for Recovery Care Planning/Counseling - Complete -  Medication Review Oceanographer) Complete Complete Complete  PCP or Specialist appointment within 3-5 days of discharge (No Data) - -  HRI or Home Care Consult Complete - -  Palliative Care Screening Not Applicable - -  Skilled Nursing Facility Not Applicable - -  Some recent data might be hidden

## 2020-11-03 NOTE — TOC Transition Note (Signed)
Transition of Care [facility]) - CM/SW Discharge Note   Patient Details  Name: [name] MRN: [id number] Date of Birth: 1931/12/03  [facility]) CM/SW Contact:  Chapman Fitch, RN Phone Number: 11/03/2020, 10:47 AM   Clinical Narrative:    Patient to discharge back to [facility]  Liborio Nixon from [place] to transport Fl[address] faxed to [place], and hard copy placed on chart for discharge  Cala Bradford with [facility] home [facility] notified of discharge Son [name] notified of discharge   Final next level of care: Assisted Living Barriers to Discharge: No Barriers Identified   Patient Goals and CMS Choice        Discharge Placement                Patient to be transferred to facility by: Liborio Nixon      Discharge Plan and Services                          HH Arranged: RN,PT,OT [facility] Agency: [facility] Date [facility] Contacted: 11/03/20   Representative spoke with at [facility] Agency: Cala Bradford  Social Determinants of Health (SDOH) Interventions     Readmission Risk Interventions Readmission Risk Prevention Plan 11/03/2020 05/28/2019 05/27/2019  Transportation Screening Complete Complete Complete  PCP or Specialist Appt within 3-5 Days (No Data) - -  HRI or Home Care Consult Complete - -  Social Work Consult for Recovery Care Planning/Counseling - - Complete  Palliative Care Screening Not Applicable - -  Medication Review Oceanographer) Complete Complete Complete  PCP or Specialist appointment within 3-5 days of discharge - (No Data) -  HRI or Home Care Consult - Complete -  Palliative Care Screening - Not Applicable -  Skilled Nursing Facility - Not Applicable -  Some recent data might be hidden

## 2020-11-03 NOTE — Discharge Instructions (Signed)
Heart Failure, Diagnosis  Heart failure means that your heart is not able to pump blood in the right way. This makes it hard for your body to work well. Heart failure is usually a long-term (chronic) condition. You must take good care of yourself and follow your treatment plan from your doctor. What are the causes? This condition may be caused by:  High blood pressure.  Build up of cholesterol and fat in the arteries.  Heart attack. This injures the heart muscle.  Heart valves that do not open and close properly.  Damage of the heart muscle. This is also called cardiomyopathy.  Lung disease.  Abnormal heart rhythms. What increases the risk? The risk of heart failure goes up as a person ages. This condition is also more likely to develop in people who:  Are overweight.  Are female.  Smoke or chew tobacco.  Abuse alcohol or illegal drugs.  Have taken medicines that can damage the heart.  Have diabetes.  Have abnormal heart rhythms.  Have thyroid problems.  Have low blood counts (anemia). What are the signs or symptoms? Symptoms of this condition include:  Shortness of breath.  Coughing.  Swelling of the feet, ankles, legs, or belly.  Losing weight for no reason.  Trouble breathing.  Waking from sleep because of the need to sit up and get more air.  Rapid heartbeat.  Being very tired.  Feeling dizzy, or feeling like you may pass out (faint).  Having no desire to eat.  Feeling like you may vomit (nauseous).  Peeing (urinating) more at night.  Feeling confused. How is this treated?     This condition may be treated with:  Medicines. These can be given to treat blood pressure and to make the heart muscles stronger.  Changes in your daily life. These may include eating a healthy diet, staying at a healthy body weight, quitting tobacco and illegal drug use, or doing exercises.  Surgery. Surgery can be done to open blocked valves, or to put devices in  the heart, such as pacemakers.  A donor heart (heart transplant). You will receive a healthy heart from a donor. Follow these instructions at home:  Treat other conditions as told by your doctor. These may include high blood pressure, diabetes, thyroid disease, or abnormal heart rhythms.  Learn as much as you can about heart failure.  Get support as you need it.  Keep all follow-up visits as told by your doctor. This is important. Summary  Heart failure means that your heart is not able to pump blood in the right way.  This condition is caused by high blood pressure, heart attack, or damage of the heart muscle.  Symptoms of this condition include shortness of breath and swelling of the feet, ankles, legs, or belly. You may also feel very tired or feel like you may vomit.  You may be treated with medicines, surgery, or changes in your daily life.  Treat other health conditions as told by your doctor. This information is not intended to replace advice given to you by your health care provider. Make sure you discuss any questions you have with your health care provider. Document Revised: 01/12/2019 Document Reviewed: 01/12/2019 Elsevier Patient Education  2020 [employer]   Heart Failure, Self Care Heart failure is a serious condition. This sheet explains things you need to do to take care of yourself at home. To help you stay as healthy as possible, you may be asked to change your diet, take   certain medicines, and make other changes in your life. Your doctor may also give you more specific instructions. If you have problems or questions, call your doctor. What are the risks? Having heart failure makes it more likely for you to have some problems. These problems can get worse if you do not take good care of yourself. Problems may include:  Blood clotting problems. This may cause a stroke.  Damage to the kidneys, liver, or lungs.  Abnormal heart rhythms. Supplies needed:  Scale  for weighing yourself.  Blood pressure monitor.  Notebook.  Medicines. How to care for yourself when you have heart failure Medicines Take over-the-counter and prescription medicines only as told by your doctor. Take your medicines every day.  Do not stop taking your medicine unless your doctor tells you to do so.  Do not skip any medicines.  Get your prescriptions refilled before you run out of medicine. This is important. Eating and drinking   Eat heart-healthy foods. Talk with a diet specialist (dietitian) to create an eating plan.  Choose foods that: ? Have no trans fat. ? Are low in saturated fat and cholesterol.  Choose healthy foods, such as: ? Fresh or frozen fruits and vegetables. ? Fish. ? Low-fat (lean) meats. ? Legumes, such as beans, peas, and lentils. ? Fat-free or low-fat dairy products. ? Whole-grain foods. ? High-fiber foods.  Limit salt (sodium) if told by your doctor. Ask your diet specialist to tell you which seasonings are healthy for your heart.  Cook in healthy ways instead of frying. Healthy ways of cooking include roasting, grilling, broiling, baking, poaching, steaming, and stir-frying.  Limit how much fluid you drink, if told by your doctor. Alcohol use  Do not drink alcohol if: ? Your doctor tells you not to drink. ? Your heart was damaged by alcohol, or you have very bad heart failure. ? You are pregnant, may be pregnant, or are planning to become pregnant.  If you drink alcohol: ? Limit how much you use to:  0-1 drink a day for women.  0-2 drinks a day for men. ? Be aware of how much alcohol is in your drink. In the [place] one drink equals one 12 oz bottle of beer (355 mL), one 5 oz glass of wine (148 mL), or one 1 oz glass of hard liquor (44 mL). Lifestyle   Do not use any products that contain nicotine or tobacco, such as cigarettes, e-cigarettes, and chewing tobacco. If you need help quitting, ask your doctor. ? Do not use  nicotine gum or patches before talking to your doctor.  Do not use illegal drugs.  Lose weight if told by your doctor.  Do physical activity if told by your doctor. Talk to your doctor before you begin an exercise if: ? You are an older adult. ? You have very bad heart failure.  Learn to manage stress. If you need help, ask your doctor.  Get rehab (rehabilitation) to help you stay independent and to help with your quality of life.  Plan time to rest when you get tired. Check weight and blood pressure   Weigh yourself every day. This will help you to know if fluid is building up in your body. ? Weigh yourself every morning after you pee (urinate) and before you eat breakfast. ? Wear the same amount of clothing each time. ? Write down your daily weight. Give your record to your doctor.  Check and write down your blood pressure as told by   your doctor.  Check your pulse as told by your doctor. Dealing with very hot and very cold weather  If it is very hot: ? Avoid activities that take a lot of energy. ? Use air conditioning or fans, or find a cooler place. ? Avoid caffeine and alcohol. ? Wear clothing that is loose-fitting, lightweight, and light-colored.  If it is very cold: ? Avoid activities that take a lot of energy. ? Layer your clothes. ? Wear mittens or gloves, a hat, and a scarf when you go outside. ? Avoid alcohol. Follow these instructions at home:  Stay up to date with shots (vaccines). Get pneumococcal and flu (influenza) shots.  Keep all follow-up visits as told by your doctor. This is important. Contact a doctor if:  You gain weight quickly.  You have increasing shortness of breath.  You cannot do your normal activities.  You get tired easily.  You cough a lot.  You don't feel like eating or feel like you may vomit (nauseous).  You become puffy (swell) in your hands, feet, ankles, or belly (abdomen).  You cannot sleep well because it is hard to  breathe.  You feel like your heart is beating fast (palpitations).  You get dizzy when you stand up. Get help right away if:  You have trouble breathing.  You or someone else notices a change in your behavior, such as having trouble staying awake.  You have chest pain or discomfort.  You pass out (faint). These symptoms may be an emergency. Do not wait to see if the symptoms will go away. Get medical help right away. Call your local emergency services (911 in the [place] Do not drive yourself to the hospital. Summary  Heart failure is a serious condition. To care for yourself, you may have to change your diet, take medicines, and make other lifestyle changes.  Take your medicines every day. Do not stop taking them unless your doctor tells you to do so.  Eat heart-healthy foods, such as fresh or frozen fruits and vegetables, fish, lean meats, legumes, fat-free or low-fat dairy products, and whole-grain or high-fiber foods.  Ask your doctor if you can drink alcohol. You may have to stop alcohol use if you have very bad heart failure.  Contact your doctor if you gain weight quickly or feel that your heart is beating too fast. Get help right away if you pass out, or have chest pain or trouble breathing. This information is not intended to replace advice given to you by your health care provider. Make sure you discuss any questions you have with your health care provider. Document Revised: 02/06/2019 Document Reviewed: 02/07/2019 Elsevier Patient Education  2020 Elsevier Inc.  

## 2020-11-11 ENCOUNTER — Ambulatory Visit: Payer: Medicare Other | Admitting: Family

## 2020-11-15 NOTE — Progress Notes (Deleted)
   Patient ID: [name], female    DOB: [birth year], 87 y.o.   MRN: [id number]  HPI  [name] is a 87 y/o female with a history of   Echo report from 10/29/20 reviewed and showed an EF of 60-65% along with mild MR.   Admitted 10/28/20 due to dizziness, weakness and shortness of breath. Cardiology consult obtained. Given IV lasix for HF with transition to oral diuretics. Able to be weaned off oxygen. Discharged after 6 days. Was in the ED 10/09/20 due to mechanical fall where she hit the back of her head. Cervical spine CT showed remote C6 fracture.  She presents today for her initial visit with a chief complaint of    Review of Systems    Physical Exam  Assessment & Plan:  1: Chronic heart failure with preserved ejection fraction without structural changes- - NYHA class - BNP 10/28/20 was 224.1  2: HTN- - BP - BMP 11/03/20 reviewed and showed sodium 138, potassium 4.4, creatinine 0.8 and GFR >60  3: DM-   - A1c 10/29/20 was 6.4%

## 2020-11-17 ENCOUNTER — Ambulatory Visit: Payer: Medicare Other | Admitting: Family

## 2020-12-04 LAB — HEPATIC FUNCTION PANEL
ALT: 10 (ref 7–35)
AST: 14 (ref 13–35)
Alkaline Phosphatase: 99 (ref 25–125)
Bilirubin, Total: 0.2

## 2020-12-04 LAB — CBC AND DIFFERENTIAL
HCT: 36 (ref 36–46)
Hemoglobin: 11.1 — AB (ref 12.0–16.0)
Neutrophils Absolute: 3.8
Platelets: 227 (ref 150–399)
WBC: 6.4

## 2020-12-04 LAB — BASIC METABOLIC PANEL
BUN: 27 — AB (ref 4–21)
Chloride: 105 (ref 99–108)
Creatinine: 1.1 (ref <=1.1)
Glucose: 137
Potassium: 5.3 (ref 3.4–5.3)
Sodium: 139 (ref 137–147)

## 2020-12-04 LAB — COMPREHENSIVE METABOLIC PANEL
Albumin: 3.9 (ref 3.5–5.0)
Calcium: 9.3 (ref 8.7–10.7)
GFR calc Af Amer: 55
GFR calc non Af Amer: 48

## 2020-12-04 LAB — CBC: RBC: 4.46 (ref 3.87–5.11)

## 2020-12-05 ENCOUNTER — Other Ambulatory Visit: Payer: Self-pay

## 2020-12-05 ENCOUNTER — Encounter: Payer: Self-pay | Admitting: Family

## 2020-12-05 ENCOUNTER — Ambulatory Visit: Payer: Medicare Other | Attending: Family | Admitting: Family

## 2020-12-05 VITALS — BP 108/69 | HR 65 | Resp 18 | Ht 65.0 in | Wt 152.0 lb

## 2020-12-05 DIAGNOSIS — Z8249 Family history of ischemic heart disease and other diseases of the circulatory system: Secondary | ICD-10-CM | POA: Diagnosis not present

## 2020-12-05 DIAGNOSIS — J3489 Other specified disorders of nose and nasal sinuses: Secondary | ICD-10-CM | POA: Insufficient documentation

## 2020-12-05 DIAGNOSIS — E119 Type 2 diabetes mellitus without complications: Secondary | ICD-10-CM

## 2020-12-05 DIAGNOSIS — E1140 Type 2 diabetes mellitus with diabetic neuropathy, unspecified: Secondary | ICD-10-CM | POA: Diagnosis not present

## 2020-12-05 DIAGNOSIS — F419 Anxiety disorder, unspecified: Secondary | ICD-10-CM | POA: Diagnosis not present

## 2020-12-05 DIAGNOSIS — R42 Dizziness and giddiness: Secondary | ICD-10-CM | POA: Insufficient documentation

## 2020-12-05 DIAGNOSIS — Z833 Family history of diabetes mellitus: Secondary | ICD-10-CM | POA: Diagnosis not present

## 2020-12-05 DIAGNOSIS — I5032 Chronic diastolic (congestive) heart failure: Secondary | ICD-10-CM | POA: Diagnosis not present

## 2020-12-05 DIAGNOSIS — Z7984 Long term (current) use of oral hypoglycemic drugs: Secondary | ICD-10-CM | POA: Insufficient documentation

## 2020-12-05 DIAGNOSIS — I110 Hypertensive heart disease with heart failure: Secondary | ICD-10-CM | POA: Diagnosis not present

## 2020-12-05 DIAGNOSIS — Z882 Allergy status to sulfonamides status: Secondary | ICD-10-CM | POA: Diagnosis not present

## 2020-12-05 DIAGNOSIS — Z7982 Long term (current) use of aspirin: Secondary | ICD-10-CM | POA: Insufficient documentation

## 2020-12-05 DIAGNOSIS — J449 Chronic obstructive pulmonary disease, unspecified: Secondary | ICD-10-CM | POA: Diagnosis not present

## 2020-12-05 DIAGNOSIS — Z880 Allergy status to penicillin: Secondary | ICD-10-CM | POA: Insufficient documentation

## 2020-12-05 DIAGNOSIS — I10 Essential (primary) hypertension: Secondary | ICD-10-CM

## 2020-12-05 NOTE — Patient Instructions (Addendum)
Begin weighing daily and call for an overnight weight gain of > 2 pounds or a weekly weight gain of >5 pounds.   Please fax lab results to the [facility] to [phone]

## 2020-12-05 NOTE — Progress Notes (Signed)
Patient ID: [name], female    DOB: 1932-07-13, 87 y.o.   MRN: [id number]  HPI  [name] is a 87 y/o female with a history of DM, hyperlipidemia, HTN, depression, anxiety, GERD, COPD and chronic heart failure.   Echo report from 10/29/20 reviewed and showed an EF of 60-65% along with mild MR.   Admitted 10/28/20 due to dizziness, weakness and shortness of breath. Cardiology consult obtained. Given IV lasix for HF with transition to oral diuretics. Able to be weaned off oxygen. Discharged after 6 days. Was in the ED 10/09/20 due to mechanical fall where she hit the back of her head. Cervical spine CT showed remote C6 fracture.  She presents today for her initial visit with a chief complaint of minimal fatigue upon moderate exertion. She describes this as chronic in nature having been present for several years. She has associated rhinorrhea, light-headedness, easy bruising, anxiety and difficulty falling asleep along with this. She denies any abdominal distention, palpitations, pedal edema, chest pain, shortness of breath or cough.   Says that she fell recently and has quite a bit of bruising on the left lower leg/ knee area. Not much pain except when she's getting up off the commode. Currently getting weighed weekly at [facility].   Past Medical History:  Diagnosis Date  . Anxiety   . CHF (congestive heart failure) (HCC)   . COPD (chronic obstructive pulmonary disease) (HCC)   . Depression   . Diabetes mellitus without complication (HCC)   . GERD (gastroesophageal reflux disease)   . Hip fracture (HCC)   . Hyperlipemia   . Hypertension   . Neuropathy   . Vertigo    Past Surgical History:  Procedure Laterality Date  . ABDOMINAL HYSTERECTOMY    . APPENDECTOMY    . BACK SURGERY     tumor removal bengin  . CHOLECYSTECTOMY     Family History  Problem Relation Age of Onset  . Diabetes Mother   . Diabetes Sister        x2  . Breast cancer Sister   . Heart  disease Father   . Heart disease Brother        x3  . Bladder Cancer Neg Hx   . Kidney cancer Neg Hx   . Prostate cancer Neg Hx    Social History   Tobacco Use  . Smoking status: Never Smoker  . Smokeless tobacco: Never Used  Substance Use Topics  . Alcohol use: No    Alcohol/week: 0.0 standard drinks   Allergies  Allergen Reactions  . Ampicillin     Zpac  . Sulfa Antibiotics    Prior to Admission medications   Medication Sig Start Date End Date Taking? Authorizing Provider  acetaminophen (TYLENOL) 500 MG tablet Take 1,000 mg by mouth 3 (three) times daily.   Yes [provider]  alum & mag hydroxide-simeth (MAALOX/MYLANTA) 200-200-20 MG/5ML suspension Take 30 mLs by mouth as needed for indigestion or heartburn.   Yes [provider]  amLODipine (NORVASC) 5 MG tablet Take 5 mg by mouth daily.   Yes [provider]  aspirin EC 81 MG tablet Take 81 mg by mouth daily.   Yes [provider]  bismuth subsalicylate (PEPTO BISMOL) 262 MG/15ML suspension Take 262 mg by mouth every 2 (two) hours as needed for indigestion (up to 6 doses in 24 hr).    Yes [provider]  calcium carbonate (TUMS - DOSED IN MG ELEMENTAL CALCIUM)  500 MG chewable tablet Chew 2 tablets (400 mg of elemental calcium total) by mouth 3 (three) times daily as needed for indigestion or heartburn. 08/23/18  Yes Wieting, Richard, MD  Cholecalciferol (VITAMIN D) 50 MCG (2000 UT) tablet Take 2,000 Units by mouth daily.   Yes [provider]  dicyclomine (BENTYL) 20 MG tablet Take 20 mg by mouth 4 (four) times daily.   Yes [provider]  diphenoxylate-atropine (LOMOTIL) 2.5-0.025 MG tablet Take 1 tablet by mouth 2 (two) times daily as needed for diarrhea or loose stools.   Yes [provider]  divalproex (DEPAKOTE ER) 250 MG 24 hr tablet Take 250 mg by mouth at bedtime.   Yes [provider]  esomeprazole (NEXIUM) 20 MG capsule Take 20 mg by  mouth daily.    Yes [provider]  furosemide (LASIX) 40 MG tablet Take 1 tablet (40 mg total) by mouth daily. 11/03/20 12/03/20 Yes Sreenath, Sudheer B, MD  ipratropium-albuterol (DUONEB) 0.5-2.5 (3) MG/3ML SOLN Take 3 mLs by nebulization 2 (two) times daily. 08/23/18  Yes Wieting, Richard, MD  loperamide (IMODIUM A-D) 2 MG tablet Take 2 mg by mouth as needed for diarrhea or loose stools (up to 4 doses in 12 hrs.).   Yes [provider]  loratadine (CLARITIN) 10 MG tablet Take 10 mg by mouth daily.   Yes [provider]  LORazepam (ATIVAN) 0.5 MG tablet Take 0.25 mg by mouth every 8 (eight) hours as needed for anxiety.   Yes [provider]  magnesium hydroxide (MILK OF MAGNESIA) 400 MG/5ML suspension Take 30 mLs by mouth daily as needed for mild constipation.   Yes [provider]  melatonin 3 MG TABS tablet Take 3 mg by mouth at bedtime as needed.   Yes [provider]  metFORMIN (GLUCOPHAGE-XR) 500 MG 24 hr tablet Take 500 mg by mouth 2 (two) times a day.   Yes [provider]  metoprolol tartrate (LOPRESSOR) 25 MG tablet Take 1 tablet (25 mg total) by mouth 2 (two) times daily. 08/23/18  Yes Wieting, Richard, MD  Multiple Vitamin (MULTIVITAMIN WITH MINERALS) TABS tablet Take 1 tablet by mouth daily. 08/23/18  Yes Wieting, Richard, MD  nystatin (MYCOSTATIN/NYSTOP) powder Apply 1 g topically 2 (two) times daily as needed (to affected area(s)).   Yes [provider]  olopatadine (PATANOL) 0.1 % ophthalmic solution Place 1 drop into both eyes daily.   Yes [provider]  ondansetron (ZOFRAN) 4 MG tablet Take 4 mg by mouth every 8 (eight) hours as needed for nausea or vomiting.   Yes [provider]  Pseudoephedrine-DM-GG (ROBITUSSIN CF PO) Take 10 mLs by mouth every 6 (six) hours as needed (cough).   Yes [provider]  QUEtiapine (SEROQUEL) 25 MG tablet Take 1 tablet (25 mg total) by mouth at  bedtime as needed (psychosis, insomnia). Patient taking differently: Take 25 mg by mouth daily. 08/23/18  Yes Wieting, Richard, MD  sertraline (ZOLOFT) 50 MG tablet Take 50 mg by mouth daily. 10/23/20  Yes [provider]  sodium phosphate Pediatric (FLEET) 3.5-9.5 GM/59ML enema Place 1 enema rectally once as needed for severe constipation.   Yes [provider]  feeding supplement, ENSURE ENLIVE, (ENSURE ENLIVE) LIQD Take 237 mLs by mouth 2 (two) times daily between meals. 10/23/19   Ghimire, Werner Lean, MD  psyllium (METAMUCIL) 58.6 % powder Take 1 packet by mouth 2 (two) times a day.    [provider]  Review of Systems  Constitutional: Positive for fatigue. Negative for appetite change.  HENT: Positive for rhinorrhea. Negative for congestion and sore throat.   Eyes: Negative.   Respiratory: Negative for cough and shortness of breath.   Cardiovascular: Negative for chest pain, palpitations and leg swelling.  Gastrointestinal: Negative for abdominal distention and abdominal pain.  Endocrine: Negative.   Genitourinary: Negative.   Musculoskeletal: Positive for arthralgias (left lower leg/ knee). Negative for back pain.  Skin: Negative.   Allergic/Immunologic: Negative.   Neurological: Positive for light-headedness. Negative for dizziness.  Hematological: Negative for adenopathy. Bruises/bleeds easily.  Psychiatric/Behavioral: Positive for sleep disturbance (trouble falling asleep; sleeping on 1 pillow). Negative for dysphoric mood. The patient is nervous/anxious.    Vitals:   12/05/20 1037  BP: 108/69  Pulse: 65  Resp: 18  SpO2: 92%  Weight: 152 lb (68.9 kg)  Height: 5\' 5"  (1.651 m)   Wt Readings from Last 3 Encounters:  12/05/20 152 lb (68.9 kg)  11/01/20 152 lb 10.9 oz (69.3 kg)  09/04/20 160 lb (72.6 kg)   Lab Results  Component Value Date   CREATININE 0.80 11/03/2020   CREATININE 0.87 11/02/2020   CREATININE 0.85 11/01/2020    Physical  Exam Vitals and nursing note reviewed. Exam conducted with a chaperone present (caregive from [facility]).  Constitutional:      Appearance: Normal appearance.  HENT:     Head: Normocephalic and atraumatic.  Cardiovascular:     Rate and Rhythm: Normal rate and regular rhythm.  Pulmonary:     Effort: Pulmonary effort is normal. No respiratory distress.     Breath sounds: No wheezing or rales.  Abdominal:     General: There is no distension.     Palpations: Abdomen is soft.  Musculoskeletal:        General: No tenderness.     Cervical back: Normal range of motion and neck supple.     Right lower leg: No edema.     Left lower leg: Edema (trace pitting) present.  Skin:    General: Skin is warm and dry.     Findings: Bruising (left lower leg) present.  Neurological:     General: No focal deficit present.     Mental Status: She is alert and oriented to person, place, and time.  Psychiatric:        Mood and Affect: Mood normal.        Behavior: Behavior normal.    Assessment & Plan:  1: Chronic heart failure with preserved ejection fraction without structural changes- - NYHA class II - euvolemic today - currently being weighed weekly; order written for [facility] to weigh daily and call for an overnight weight gain of > 2 pounds or a weekly weight gain of > 5 pounds - not adding salt and caregiver says the facility does not cook with salt either - BNP 10/28/20 was 224.1 - reports receiving flu vaccine for this season - reports receiving 3 covid vaccines  2: HTN- - BP looks good today - saw PCP at [facility] yesterday & had labs drawn; asked to have lab results faxed to [facility] - BMP 11/03/20 reviewed and showed sodium 138, potassium 4.4, creatinine 0.8 and GFR >60  3: DM-   - A1c 10/29/20 was 6.4%   Facility medication list was reviewed.   Return in 6 weeks or sooner for any questions/problems before then.

## 2020-12-24 LAB — BLOOD GAS, VENOUS
Acid-base deficit: 2.9 mmol/L — ABNORMAL HIGH (ref 0.0–2.0)
Bicarbonate: 25.1 mmol/L (ref 20.0–28.0)
O2 Saturation: 39.6 %
Patient temperature: 37
pCO2, Ven: 60 mmHg (ref 44.0–60.0)
pH, Ven: 7.23 — ABNORMAL LOW (ref 7.250–7.430)

## 2021-01-15 ENCOUNTER — Ambulatory Visit: Payer: Medicare Other | Admitting: Family

## 2021-04-29 ENCOUNTER — Other Ambulatory Visit: Payer: Self-pay

## 2021-04-29 ENCOUNTER — Inpatient Hospital Stay
Admission: EM | Admit: 2021-04-29 | Discharge: 2021-05-04 | DRG: 481 | Disposition: A | Discharge status: Skilled Nursing Facility | Payer: Medicare Other | Source: Skilled Nursing Facility | Attending: Obstetrics and Gynecology | Admitting: Obstetrics and Gynecology

## 2021-04-29 ENCOUNTER — Emergency Department: Payer: Medicare Other

## 2021-04-29 DIAGNOSIS — Z7984 Long term (current) use of oral hypoglycemic drugs: Secondary | ICD-10-CM | POA: Diagnosis not present

## 2021-04-29 DIAGNOSIS — N179 Acute kidney failure, unspecified: Secondary | ICD-10-CM | POA: Diagnosis not present

## 2021-04-29 DIAGNOSIS — E785 Hyperlipidemia, unspecified: Secondary | ICD-10-CM | POA: Diagnosis present

## 2021-04-29 DIAGNOSIS — S72142A Displaced intertrochanteric fracture of left femur, initial encounter for closed fracture: Principal | ICD-10-CM | POA: Diagnosis present

## 2021-04-29 DIAGNOSIS — J9611 Chronic respiratory failure with hypoxia: Secondary | ICD-10-CM | POA: Diagnosis present

## 2021-04-29 DIAGNOSIS — E119 Type 2 diabetes mellitus without complications: Secondary | ICD-10-CM | POA: Diagnosis not present

## 2021-04-29 DIAGNOSIS — I10 Essential (primary) hypertension: Secondary | ICD-10-CM

## 2021-04-29 DIAGNOSIS — I130 Hypertensive heart and chronic kidney disease with heart failure and stage 1 through stage 4 chronic kidney disease, or unspecified chronic kidney disease: Secondary | ICD-10-CM | POA: Diagnosis present

## 2021-04-29 DIAGNOSIS — I5032 Chronic diastolic (congestive) heart failure: Secondary | ICD-10-CM | POA: Diagnosis present

## 2021-04-29 DIAGNOSIS — K219 Gastro-esophageal reflux disease without esophagitis: Secondary | ICD-10-CM | POA: Diagnosis present

## 2021-04-29 DIAGNOSIS — W19XXXA Unspecified fall, initial encounter: Secondary | ICD-10-CM | POA: Diagnosis present

## 2021-04-29 DIAGNOSIS — S72002A Fracture of unspecified part of neck of left femur, initial encounter for closed fracture: Secondary | ICD-10-CM | POA: Diagnosis present

## 2021-04-29 DIAGNOSIS — Z79899 Other long term (current) drug therapy: Secondary | ICD-10-CM

## 2021-04-29 DIAGNOSIS — Z9049 Acquired absence of other specified parts of digestive tract: Secondary | ICD-10-CM | POA: Diagnosis not present

## 2021-04-29 DIAGNOSIS — N1831 Chronic kidney disease, stage 3a: Secondary | ICD-10-CM | POA: Diagnosis present

## 2021-04-29 DIAGNOSIS — F419 Anxiety disorder, unspecified: Secondary | ICD-10-CM | POA: Diagnosis present

## 2021-04-29 DIAGNOSIS — F0390 Unspecified dementia without behavioral disturbance: Secondary | ICD-10-CM | POA: Diagnosis present

## 2021-04-29 DIAGNOSIS — S0083XA Contusion of other part of head, initial encounter: Secondary | ICD-10-CM | POA: Diagnosis present

## 2021-04-29 DIAGNOSIS — Z9981 Dependence on supplemental oxygen: Secondary | ICD-10-CM

## 2021-04-29 DIAGNOSIS — J449 Chronic obstructive pulmonary disease, unspecified: Secondary | ICD-10-CM | POA: Diagnosis present

## 2021-04-29 DIAGNOSIS — R52 Pain, unspecified: Secondary | ICD-10-CM | POA: Diagnosis present

## 2021-04-29 DIAGNOSIS — E1122 Type 2 diabetes mellitus with diabetic chronic kidney disease: Secondary | ICD-10-CM | POA: Diagnosis present

## 2021-04-29 DIAGNOSIS — Z20822 Contact with and (suspected) exposure to covid-19: Secondary | ICD-10-CM | POA: Diagnosis present

## 2021-04-29 DIAGNOSIS — Z90710 Acquired absence of both cervix and uterus: Secondary | ICD-10-CM

## 2021-04-29 DIAGNOSIS — F32A Depression, unspecified: Secondary | ICD-10-CM | POA: Diagnosis present

## 2021-04-29 DIAGNOSIS — Z419 Encounter for procedure for purposes other than remedying health state, unspecified: Secondary | ICD-10-CM

## 2021-04-29 DIAGNOSIS — Z66 Do not resuscitate: Secondary | ICD-10-CM | POA: Diagnosis present

## 2021-04-29 DIAGNOSIS — D649 Anemia, unspecified: Secondary | ICD-10-CM | POA: Diagnosis not present

## 2021-04-29 DIAGNOSIS — Z7982 Long term (current) use of aspirin: Secondary | ICD-10-CM | POA: Diagnosis not present

## 2021-04-29 DIAGNOSIS — Z8616 Personal history of COVID-19: Secondary | ICD-10-CM | POA: Diagnosis not present

## 2021-04-29 DIAGNOSIS — Z01810 Encounter for preprocedural cardiovascular examination: Secondary | ICD-10-CM | POA: Diagnosis not present

## 2021-04-29 DIAGNOSIS — D62 Acute posthemorrhagic anemia: Secondary | ICD-10-CM | POA: Diagnosis not present

## 2021-04-29 DIAGNOSIS — Z833 Family history of diabetes mellitus: Secondary | ICD-10-CM | POA: Diagnosis not present

## 2021-04-29 DIAGNOSIS — Z8249 Family history of ischemic heart disease and other diseases of the circulatory system: Secondary | ICD-10-CM

## 2021-04-29 HISTORY — DX: Unspecified dementia, unspecified severity, without behavioral disturbance, psychotic disturbance, mood disturbance, and anxiety: F03.90

## 2021-04-29 LAB — COMPREHENSIVE METABOLIC PANEL
ALT: 22 U/L (ref 0–44)
AST: 20 U/L (ref 15–41)
Albumin: 3.1 g/dL — ABNORMAL LOW (ref 3.5–5.0)
Alkaline Phosphatase: 83 U/L (ref 38–126)
Anion gap: 6 (ref 5–15)
BUN: 37 mg/dL — ABNORMAL HIGH (ref 8–23)
CO2: 26 mmol/L (ref 22–32)
Calcium: 8.7 mg/dL — ABNORMAL LOW (ref 8.9–10.3)
Chloride: 108 mmol/L (ref 98–111)
Creatinine, Ser: 0.91 mg/dL (ref 0.44–1.00)
GFR, Estimated: >60 mL/min (ref >60)
Glucose, Bld: 163 mg/dL — ABNORMAL HIGH (ref 70–99)
Potassium: 4.7 mmol/L (ref 3.5–5.1)
Sodium: 140 mmol/L (ref 135–145)
Total Bilirubin: 0.9 mg/dL (ref 0.3–1.2)
Total Protein: 6.6 g/dL (ref 6.5–8.1)

## 2021-04-29 LAB — CBC
HCT: 28.9 % — ABNORMAL LOW (ref 36.0–46.0)
Hemoglobin: 9.1 g/dL — ABNORMAL LOW (ref 12.0–15.0)
MCH: 26.3 pg (ref 26.0–34.0)
MCHC: 31.5 g/dL (ref 30.0–36.0)
MCV: 83.5 fL (ref 80.0–100.0)
Platelets: 178 10*3/uL (ref 150–400)
RBC: 3.46 MIL/uL — ABNORMAL LOW (ref 3.87–5.11)
RDW: 16.7 % — ABNORMAL HIGH (ref 11.5–15.5)
WBC: 8.4 10*3/uL (ref 4.0–10.5)
nRBC: 0 % (ref 0.0–0.2)

## 2021-04-29 LAB — TROPONIN I (HIGH SENSITIVITY): Troponin I (High Sensitivity): 12 ng/L (ref <18)

## 2021-04-29 LAB — CBG MONITORING, ED: Glucose-Capillary: 161 mg/dL — ABNORMAL HIGH (ref 70–99)

## 2021-04-29 MED ORDER — ACETAMINOPHEN 650 MG RE SUPP
650.0000 mg | Freq: Four times a day (QID) | RECTAL | Status: DC | PRN
  Administered 2021-04-30: 650 mg via RECTAL
  Filled 2021-04-29: qty 1

## 2021-04-29 MED ORDER — TRANEXAMIC ACID FOR EPISTAXIS
1000.0000 mg | Freq: Once | TOPICAL | Status: DC
  Filled 2021-04-29: qty 10

## 2021-04-29 MED ORDER — ACETAMINOPHEN 325 MG PO TABS: 650.0000 mg | ORAL_TABLET | Freq: Four times a day (QID) | ORAL | Status: DC | PRN

## 2021-04-29 NOTE — H&P (Signed)
History and Physical    PLEASE NOTE THAT DRAGON DICTATION SOFTWARE WAS USED IN THE CONSTRUCTION OF THIS NOTE.   [name] XBM:[id number] DOB: 10/26/1932 DOA: 04/29/2021  PCP: Lauro Regulus, MD Patient coming from: SNF  I have personally briefly reviewed patient's old medical records in [facility]  Chief Complaint: fall  HPI: [name] is a 87 y.o. female with medical history significant for chronic hypoxic respiratory failure on continuous 3 to 4 L [place], COPD, type 2 diabetes mellitus, essential hypertension, chronic anemia with baseline hemoglobin 10.5-11, dementia, who is admitted to [facility] on 04/29/2021 with acute left femoral neck fracture after presenting from SNF to [facility] ED for evaluation of fall.   In the context of the patient's dementia, the following history is provided by SNF staff as well as my discussions with the emergency department physician and via chart review.  The patient reportedly experienced an unwitnessed fall at her SNF earlier today.  Specific timing of this fall is not entirely clear, although the patient was found on the ground of her room, consciousness, and attempting to extricate herself from the floor.  Patient was without acute complaint at that time, but subsequently brought to [facility] ED for further evaluation of the above fall.   Per chart review, in terms of recent modifications to her outpatient medication regimen, it appears that the patient was recently started on scheduled tramadol 50 mg p.o. twice daily.  In the setting of a documented history of COPD and chronic diastolic heart failure, the patient is a history of chronic hypoxic respiratory failure, which, per chart review, appears to be associated with baseline continuous 3 to 4 L nasal cannula.  Her outpatient respiratory regimen appears to consist of scheduled duonebs.  Most recent echocardiogram was performed in December 2021, which showed LVEF 60 to  65%, no evidence of focal wall motion normalities, normal diastolic parameters, and mild mitral regurgitation.  Outpatient diuretic regimen consists of Lasix 40 mg p.o. daily.  She also has a history of type 2 diabetes mellitus for which she is on metformin as her sole outpatient oral hypoglycemic agent in the absence of any exogenous insulin as an outpatient.  Additionally, per chart review, she has a documented history of chronic anemia associate with baseline hemoglobin 10.5-11, and is on a daily baby aspirin as result outpatient blood thinner.      ED Course:  Vital signs in the ED were notable for the following:  - Temperature max 98.6, heart rate 70-75; blood pressure 124/56 126/57; respiratory rate 17-21; oxygen saturation 96 to 98% on baseline 4 L nasal cannula.  Labs were notable for the following: BMP was notable for the following: Sodium 140 BUN 37 relative to 27 in January 2022 and 45 in December 2021, creatinine 0.91 relative to most recent prior creatinine data point of 1.1 on 12/04/2020, glucose 163.  High-sensitivity troponin I noted to be 12.  CBC notable for the following: White blood cell count 8400, hemoglobin 9.1 associated with normocytic/normochromic findings as well as RDW of 16.7, platelets 178.  Nasopharyngeal COVID-19/influenza PCR were checked in the ED today, with result currently pending.  EKG performed this evening, in comparison to most recent prior performed on 10/28/2020, showed sinus rhythm, heart rate 75, right bundle branch block, left anterior fascicular block, QRS 151, QTc 476, and nonspecific T wave inversion in V3, without evidence of ST changes, including no evidence of ST elevation.  Chest x-ray showed chronic  interstitial changes without evidence of acute cardiopulmonary abnormality.  Noncontrast CT of the head showed no acute intracranial pathology, including no evidence of intracranial hemorrhage, while showing mild right forehead contusion in the absence of any  evidence of acute skull fracture.  CT cervical spine showed no evidence of acute or traumatic cervical spine pathology.  Plain films of the left hip showed mildly displaced and angulated fracture of the left femoral neck without dislocation.  The patient's case was discussed with the on-call orthopedic surgeon, Dr.Patel , who will formally consult and recommended admission to the hospitalist service for further evaluation and management of presenting acute left femoral neck fracture. Dr. Allena KatzPatel anticipates taking patient for surgery in the early afternoon of 04/30/21 for left hp IM nail. He recommends n.p.o. at midnight as well as holding pharmacologic anticoagulation.    Review of Systems: As per HPI otherwise 10 point review of systems negative.   Past Medical History:  Diagnosis Date   Anxiety    CHF (congestive heart failure) (HCC)    COPD (chronic obstructive pulmonary disease) (HCC)    Depression    Diabetes mellitus without complication (HCC)    GERD (gastroesophageal reflux disease)    Hip fracture (HCC)    Hyperlipemia    Hypertension    Neuropathy    Vertigo     Past Surgical History:  Procedure Laterality Date   ABDOMINAL HYSTERECTOMY     APPENDECTOMY     BACK SURGERY     tumor removal bengin   CHOLECYSTECTOMY      Social History:  reports that she has never smoked. She has never used smokeless tobacco. She reports that she does not drink alcohol and does not use drugs.   Allergies  Allergen Reactions   Ampicillin     Zpac   Sulfa Antibiotics     Family History  Problem Relation Age of Onset   Diabetes Mother    Diabetes Sister        x2   Breast cancer Sister    Heart disease Father    Heart disease Brother        x3   Bladder Cancer Neg Hx    Kidney cancer Neg Hx    Prostate cancer Neg Hx     Family history reviewed and not pertinent    Prior to Admission medications   Medication Sig Start Date End Date Taking? Authorizing Provider   acetaminophen (TYLENOL) 500 MG tablet Take 1,000 mg by mouth 3 (three) times daily.   Yes [provider]  amLODipine (NORVASC) 5 MG tablet Take 5 mg by mouth daily.   Yes [provider]  aspirin EC 81 MG tablet Take 81 mg by mouth daily.   Yes [provider]  azelastine (ASTELIN) 0.1 % nasal spray Place 1 spray into both nostrils 2 (two) times daily. Use in each nostril as directed   Yes [provider]  bismuth subsalicylate (PEPTO BISMOL) 262 MG/15ML suspension Take 262 mg by mouth every 2 (two) hours as needed for indigestion (up to 6 doses in 24 hr).    Yes [provider]  calcium carbonate (TUMS - DOSED IN MG ELEMENTAL CALCIUM) 500 MG chewable tablet Chew 2 tablets (400 mg of elemental calcium total) by mouth 3 (three) times daily as needed for indigestion or heartburn. 08/23/18  Yes Wieting, Richard, MD  Cholecalciferol (VITAMIN D) 50 MCG (2000 UT) tablet Take 2,000 Units by mouth daily.   Yes [provider]  dicyclomine (BENTYL) 20 MG tablet Take 20 mg by mouth 4 (four) times daily.   Yes [provider]  diphenoxylate-atropine (LOMOTIL) 2.5-0.025 MG tablet Take 1 tablet by mouth 2 (two) times daily as needed for diarrhea or loose stools.   Yes [provider]  divalproex (DEPAKOTE ER) 250 MG 24 hr tablet Take 250 mg by mouth at bedtime.   Yes [provider]  esomeprazole (NEXIUM) 20 MG capsule Take 20 mg by mouth daily.    Yes [provider]  feeding supplement, ENSURE ENLIVE, (ENSURE ENLIVE) LIQD Take 237 mLs by mouth 2 (two) times daily between meals. 10/23/19  Yes Ghimire, Werner Lean, MD  furosemide (LASIX) 40 MG tablet Take 1 tablet (40 mg total) by mouth daily. 11/03/20 04/29/21 Yes Sreenath, Sudheer B, MD  ipratropium-albuterol (DUONEB) 0.5-2.5 (3) MG/3ML SOLN Take 3 mLs by nebulization 2 (two) times daily. 08/23/18  Yes Wieting, Richard, MD  loperamide (IMODIUM A-D) 2 MG tablet Take 2 mg by  mouth as needed for diarrhea or loose stools (up to 4 doses in 12 hrs.).   Yes [provider]  loratadine (CLARITIN) 10 MG tablet Take 10 mg by mouth daily.   Yes [provider]  LORazepam (ATIVAN) 0.5 MG tablet Take 0.25 mg by mouth every 8 (eight) hours as needed for anxiety.   Yes [provider]  magnesium hydroxide (MILK OF MAGNESIA) 400 MG/5ML suspension Take 30 mLs by mouth daily as needed for mild constipation.   Yes [provider]  melatonin 3 MG TABS tablet Take 3 mg by mouth at bedtime as needed.   Yes [provider]  metFORMIN (GLUCOPHAGE-XR) 500 MG 24 hr tablet Take 500 mg by mouth 2 (two) times a day.   Yes [provider]  metoprolol tartrate (LOPRESSOR) 25 MG tablet Take 1 tablet (25 mg total) by mouth 2 (two) times daily. 08/23/18  Yes Wieting, Richard, MD  Multiple Vitamin (MULTIVITAMIN WITH MINERALS) TABS tablet Take 1 tablet by mouth daily. 08/23/18  Yes Wieting, Richard, MD  olopatadine (PATANOL) 0.1 % ophthalmic solution Place 1 drop into both eyes daily.   Yes [provider]  ondansetron (ZOFRAN) 4 MG tablet Take 4 mg by mouth every 8 (eight) hours as needed for nausea or vomiting.   Yes [provider]  Pseudoephedrine-DM-GG (ROBITUSSIN CF PO) Take 10 mLs by mouth every 6 (six) hours as needed (cough).   Yes [provider]  QUEtiapine (SEROQUEL) 25 MG tablet Take 1 tablet (25 mg total) by mouth at bedtime as needed (psychosis, insomnia). Patient taking differently: Take 25 mg by mouth 2 (two) times daily. 08/23/18  Yes Wieting, Richard, MD  sertraline (ZOLOFT) 50 MG tablet Take 50 mg by mouth daily. 10/23/20  Yes [provider]  sodium phosphate Pediatric (FLEET) 3.5-9.5 GM/59ML enema Place 1 enema rectally once as needed for severe constipation.   Yes [provider]  traMADol (ULTRAM) 50 MG tablet Take 50 mg by mouth 2 (two) times daily.   Yes [provider]   alum & mag hydroxide-simeth (MAALOX/MYLANTA) 200-200-20 MG/5ML suspension Take 30 mLs by mouth as needed for indigestion or heartburn.    [provider]  nystatin (MYCOSTATIN/NYSTOP) powder Apply 1 g topically 2 (two) times daily as needed (to affected area(s)).    [provider]  psyllium (METAMUCIL) 58.6 % powder Take 1 packet by mouth 2 (two) times a day. Patient not taking: Reported on 04/29/2021    [provider]  Objective    Physical Exam: Vitals:   04/29/21 2203 04/29/21 2216  BP: (!) 124/56   Pulse: 75   Resp: 17   Temp: 98.6 F (37 C)   TempSrc: Oral   SpO2: (!) 86%   Weight:  85.3 kg    General: appears to be stated age; alert, confused; Skin: warm, dry,  Head:  right forehead contusion, otherwise AT/[place] Mouth:  Oral mucosa membranes appear moist, normal dentition Neck: supple; trachea midline Heart:  RRR; did not appreciate any M/R/G Lungs: CTAB, did not appreciate any wheezes, rales, or rhonchi Abdomen: + BS; soft, ND Vascular: 2+ pedal pulses b/l; 2+ radial pulses b/l Extremities: no peripheral edema, no muscle wasting Neuro: In the setting of the patient's current mental status and associated inability to follow instructions, unable to perform full neurologic exam at this time.  As such, assessment of strength, sensation, and cranial nerves is limited at this time.     Labs on Admission: I have personally reviewed following labs and imaging studies  CBC: Recent Labs  Lab 04/29/21 2221  WBC 8.4  HGB 9.1*  HCT 28.9*  MCV 83.5  PLT 178   Basic Metabolic Panel: Recent Labs  Lab 04/29/21 2221  NA 140  K 4.7  CL 108  CO2 26  GLUCOSE 163*  BUN 37*  CREATININE 0.91  CALCIUM 8.7*   GFR: Estimated Creatinine Clearance: 47.8 mL/min (by C-G formula based on SCr of 0.91 mg/dL). Liver Function Tests: Recent Labs  Lab 04/29/21 2221  AST 20  ALT 22  ALKPHOS 83  BILITOT 0.9  PROT 6.6  ALBUMIN 3.1*   No results  for input(s): LIPASE, AMYLASE in the last 168 hours. No results for input(s): AMMONIA in the last 168 hours. Coagulation Profile: No results for input(s): INR, PROTIME in the last 168 hours. Cardiac Enzymes: No results for input(s): CKTOTAL, CKMB, CKMBINDEX, TROPONINI in the last 168 hours. BNP (last 3 results) No results for input(s): PROBNP in the last 8760 hours. HbA1C: No results for input(s): HGBA1C in the last 72 hours. CBG: Recent Labs  Lab 04/29/21 2208  GLUCAP 161*   Lipid Profile: No results for input(s): CHOL, HDL, LDLCALC, TRIG, CHOLHDL, LDLDIRECT in the last 72 hours. Thyroid Function Tests: No results for input(s): TSH, T4TOTAL, FREET4, T3FREE, THYROIDAB in the last 72 hours. Anemia Panel: No results for input(s): VITAMINB12, FOLATE, FERRITIN, TIBC, IRON, RETICCTPCT in the last 72 hours. Urine analysis:    Component Value Date/Time   COLORURINE YELLOW (A) 10/28/2020 1632   APPEARANCEUR HAZY (A) 10/28/2020 1632   APPEARANCEUR Clear 05/10/2017 0932   LABSPEC 1.015 10/28/2020 1632   LABSPEC 1.038 06/17/2014 1357   PHURINE 5.0 10/28/2020 1632   GLUCOSEU NEGATIVE 10/28/2020 1632   GLUCOSEU Negative 06/17/2014 1357   HGBUR NEGATIVE 10/28/2020 1632   BILIRUBINUR NEGATIVE 10/28/2020 1632   BILIRUBINUR Negative 05/10/2017 0932   BILIRUBINUR Negative 06/17/2014 1357   KETONESUR NEGATIVE 10/28/2020 1632   PROTEINUR 100 (A) 10/28/2020 1632   NITRITE NEGATIVE 10/28/2020 1632   LEUKOCYTESUR SMALL (A) 10/28/2020 1632   LEUKOCYTESUR Negative 06/17/2014 1357    Radiological Exams on Admission: DG Chest 1 View  Result Date: 04/29/2021 CLINICAL DATA:  Recent fall with known left hip fracture, initial encounter EXAM: CHEST  1 VIEW COMPARISON:  10/28/2020 FINDINGS: Cardiac shadow is enlarged but stable. Aortic calcifications are again seen. Postsurgical changes are noted and stable. No focal infiltrate or sizable effusion is seen. Mild chronic interstitial changes are noted.  IMPRESSION: Chronic interstitial changes without acute abnormality. Electronically Signed   By: Alcide Clever M.D.   On: 04/29/2021 22:48   CT Head Wo Contrast  Result Date: 04/29/2021 CLINICAL DATA:  87 year old female with head trauma. EXAM: CT HEAD WITHOUT CONTRAST CT CERVICAL SPINE WITHOUT CONTRAST TECHNIQUE: Multidetector CT imaging of the head and cervical spine was performed following the standard protocol without intravenous contrast. Multiplanar CT image reconstructions of the cervical spine were also generated. COMPARISON:  Head CT dated 10/08/2020. FINDINGS: Evaluation of this exam is limited due to motion artifact. CT HEAD FINDINGS Brain: Moderate age-related atrophy and chronic microvascular ischemic changes. There is no acute intracranial hemorrhage. No mass effect or midline shift. No extra-axial fluid collection. Vascular: No hyperdense vessel or unexpected calcification. Skull: Normal. Negative for fracture or focal lesion. Sinuses/Orbits: No acute finding. Other: Mild right forehead contusion. CT CERVICAL SPINE FINDINGS Alignment: No acute subluxation. Skull base and vertebrae: No acute fracture. Osteopenia. Chronic appearing fracture of C6 with anterior wedging. Soft tissues and spinal canal: No prevertebral fluid or swelling. No visible canal hematoma. Disc levels:  Multilevel degenerative changes and facet arthropathy. Upper chest: Negative. Other: Large left thyroid nodule measuring up to 6 cm. In the setting of significant comorbidities or limited life expectancy, no follow-up recommended (ref: J Am Coll Radiol. 2015 Feb;12(2): 143-50). IMPRESSION: 1. No acute intracranial pathology. Moderate age-related atrophy and chronic microvascular ischemic changes. 2. No acute/traumatic cervical spine pathology. Multilevel degenerative changes. Electronically Signed   By: Elgie Collard M.D.   On: 04/29/2021 23:27   CT Cervical Spine Wo Contrast  Result Date: 04/29/2021 CLINICAL DATA:   87 year old female with head trauma. EXAM: CT HEAD WITHOUT CONTRAST CT CERVICAL SPINE WITHOUT CONTRAST TECHNIQUE: Multidetector CT imaging of the head and cervical spine was performed following the standard protocol without intravenous contrast. Multiplanar CT image reconstructions of the cervical spine were also generated. COMPARISON:  Head CT dated 10/08/2020. FINDINGS: Evaluation of this exam is limited due to motion artifact. CT HEAD FINDINGS Brain: Moderate age-related atrophy and chronic microvascular ischemic changes. There is no acute intracranial hemorrhage. No mass effect or midline shift. No extra-axial fluid collection. Vascular: No hyperdense vessel or unexpected calcification. Skull: Normal. Negative for fracture or focal lesion. Sinuses/Orbits: No acute finding. Other: Mild right forehead contusion. CT CERVICAL SPINE FINDINGS Alignment: No acute subluxation. Skull base and vertebrae: No acute fracture. Osteopenia. Chronic appearing fracture of C6 with anterior wedging. Soft tissues and spinal canal: No prevertebral fluid or swelling. No visible canal hematoma. Disc levels:  Multilevel degenerative changes and facet arthropathy. Upper chest: Negative. Other: Large left thyroid nodule measuring up to 6 cm. In the setting of significant comorbidities or limited life expectancy, no follow-up recommended (ref: J Am Coll Radiol. 2015 Feb;12(2): 143-50). IMPRESSION: 1. No acute intracranial pathology. Moderate age-related atrophy and chronic microvascular ischemic changes. 2. No acute/traumatic cervical spine pathology. Multilevel degenerative changes. Electronically Signed   By: Elgie Collard M.D.   On: 04/29/2021 23:27   DG Hip Unilat W or Wo Pelvis 2-3 Views Left  Result Date: 04/29/2021 CLINICAL DATA:  87 year old female with fall and left hip pain. EXAM: DG HIP (WITH OR WITHOUT PELVIS) 2-3V LEFT COMPARISON:  None. FINDINGS: There is a mildly displaced and angulated fracture of the left femoral  neck predominantly involving the intertrochanteric and subtrochanteric region. There is varus angulation. No dislocation. The bones are osteopenic. The soft tissues are unremarkable. IMPRESSION: Mildly displaced and angulated fracture of the left femoral neck.  No dislocation. Electronically Signed   By: Elgie Collard M.D.   On: 04/29/2021 22:53     EKG: Independently reviewed, with result as described above.    Assessment/Plan   [name] is a 87 y.o. female with medical history significant for chronic hypoxic respiratory failure on continuous 3 to 4 L [place], COPD, type 2 diabetes mellitus, essential hypertension, chronic anemia with baseline hemoglobin 10.5-11, dementia, who is admitted to [facility] on 04/29/2021 with acute left femoral neck fracture after presenting from SNF to [facility] ED for evaluation of fall.    Principal Problem:   Closed left hip fracture (HCC) Active Problems:   Anxiety   Diabetes (HCC)   HTN (hypertension)   Chronic diastolic (congestive) heart failure (HCC)   Acute on chronic anemia   Fall   Chronic respiratory failure with hypoxia (HCC)     #) Acute left femoral neck fracture: confirmed via presenting plain films and stemming from unwitnessed, presumably ground level, fall without overt loss of consciousness that occurred earlier on the day of admission, as further described above. The patient's case/imaging were discussed with the on-call orthopedic surgeon, Dr. Allena Katz,  who recommended admission to the hospitalist service for further evaluation and management of acute left hip fracture, including preoperative medical optimization, and plan to take pt to the OR tomorrow (04/30/21)  for definitive surgical management, at which time left hip IM nail is a anticipated in the early afternoon. Consistent with this, orthopedic surgery requests that the pt be made NPO at MN and to refrain from pharmacologic DVT prophylaxis.  Neurovascular evaluation  of the left lower extremity is still limited by the patient's baseline dementia.  She is on a daily baby aspirin as her sole outpatient blood thinner, with most recent dose occurring on the morning of 04/29/2021.   Gupta Score for this patient in the context of anticipated aforementioned orthopedic surgery conveys a 2.53% perioperative risk for significant cardiac event. No evidence to suggest acutely decompensated heart failure, including chest x-ray showing no evidence of acute cardiopulmonary process, nor any overt evidence to suggest acute MI, including EKG which shows sinus rhythm and no evidence of acute ischemic changes, while high-sensitivity troponin I was found to be nonelevated. Consequently, no absolute contraindications to proceeding with proposed orthopedic surgery at this time.   Plan: Formal orthopedic surgery consult for definitive surgical management, with plan to take patient to the OR tomorrow (04/30/21). NPO after MN in anticipation of this procedure.  No pharmacologic anticoagulation leading up to this anticipated surgery. SCD's. Prn IV fentanyl.  As needed Narcan ordered.anticipate postoperative PT consult. Check INR. Check 25-hydroxy vit D level. Holding home aspirin.  Hold home scheduled tramadol.  Check urinalysis.      #) Ground level fall: Unwitnessed ground level fall earlier today, as described above, without evidence of loss of consciousness.  It appears that the patient may have hit her head as a component of this fall, with evidence of mild right forehead contusion, but with ensuing CT head showing no evidence of acute intracranial pathology, including no evidence of intracranial hemorrhage, and also showing no evidence of acute skull fracture.  Additionally, CT cervical spine showed no evidence of acute traumatic cervical spine pathology.  Unable to ascertain additional information regarding the nature of this fall, including mechanism thereof, in the setting of the  patient's dementia, with presenting mental status appearing to be at baseline at this time.  In terms of factors that may have  predisposed her to experiencing a fall, chart review reveals documentation of recent initiation of scheduled tramadol 50 mg p.o. twice daily, as further detailed above.  Patient does not appear septic, and no overt evidence of underlying infectious source that may have contributed to her fall, although we will check urinalysis and follow for results of COVID-19 screening as further assessment of this.     Plan: Check urinalysis, as above.  Follow for result of nasopharyngeal COVID-19/influenza PCR performed in the ED this evening.  Repeat BMP and CBC with differential in the morning. Fall precautions. Anticipate postoperative PT consult.  Hold home tramadol.  I have ordered as needed Narcan along with her as needed IV fentanyl as symptomatic management associated with presenting acute left hip fracture, as above.       #) Acute on chronic anemia: In setting of a documented history of chronic anemia associated baseline hemoglobin 10.5-11, most recent prior hemoglobin noted to be 11.1 on 12/04/2020, presenting hemoglobin this evening noted to be 9.1, and associated with normocytic/normochromic findings as well as a mildly elevated RDW of 16.5.  Appears hemodynamically stable.  Is on a daily baby aspirin as well patient blood thinning agent.  No evidence of acute intracranial hemorrhage, per CT head performed this evening.  We will expand laboratory evaluation for acute on chronic anemia, as further detailed below, while holding home baby aspirin and refraining from formal pharmacologic DVT prophylaxis.  Of note, type and screen were performed in the ED this evening.  Plan: Add on the following to presenting labs: Iron studies, MMA, folic acid level, reticulocyte count.  Check INR.  Repeat CBC in the morning.  Monitor on telemetry.  Monitor continuous pulse oximetry.  Hold home baby  aspirin.  SCDs.       #) Chronic hypoxic respiratory failure: Documented history of such, which appears to be associated with baseline supplemental oxygen requirement of continuous 3 to 4 L nasal cannula, although additional chart review is warranted to further confirm this.  This appears to be in setting of a documented history of COPD as well as chronic diastolic heart failure.  No evidence to suggest acute COPD exacerbation or acute decompensated heart failure at this time, and patient is maintaining oxygen saturations of 96 to 98% on her reported baseline 4 L nasal cannula.  Outpatient respiratory regimen appears consists of scheduled duo nebulizer treatments.  Plan: Continue scheduled duo nebulizer treatments.  As needed albuterol nebulizers.  Additional chart review to quantitatively confirm baseline supplemental oxygen requirements.  Additionally, will attempt chart review for evaluation of most recent prior pulmonary function testing.  Monitor strict I's and O's and daily weights.       #) Chronic diastolic heart failure: Documented history of such, although most recent prior echocardiogram, which was performed in December 2021 showed LVEF 60 to 65% and normal diastolic parameters with mild mitral regurgitation.  Outpatient diuretic regimen appears consists of Lasix 40 mg p.o. daily.  No clinical or radiographic evidence to suggest acutely decompensated heart failure at this time.  Plan: Monitor strict I's and O's and daily weights.  We will hold home Lasix for now.  Add on serum magnesium level.  CMP in the morning.      #) Type 2 diabetes mellitus: On metformin as an outpatient.  Not on any insulin as an outpatient.  Presenting blood sugar noted to be 163.  Plan: Hold home metformin during this hospitalization.  In the setting of current n.p.o. status, I have  ordered every 6 hours Accu-Cheks with low-dose sliding scale insulin.       #) Essential hypertension: Outpatient  hypertensive regimen includes Norvasc as well as Lopressor.  Presenting blood pressures noted to be normotensive, with associated systolic blood pressures in the 120s.  Plan: In the setting of current n.p.o. status in anticipation of patient being taken to the OR for definitive surgical management of presenting left hip fracture, will hold home Norvasc for now.  I placed order to resume Lopressor on the evening of 04/30/21 for associated mortality benefit of postoperative resumption of beta-blocker.  Was monitoring of ensuing blood pressure via routine vital signs.       #) Generalized anxiety disorder: Documented history of such, with pharmacologic management including Zoloft as well as as needed Ativan, without any documented reports of paradoxical reaction to this benzodiazepine.  Plan: In the setting of current n.p.o. status, will hold outpatient as needed oral Ativan, but rather, I have ordered as needed IV Ativan for anxiety or agitation.  Hold home Zoloft for now in the setting of current n.p.o. status.    DVT prophylaxis: scd's  Code Status: DNR Disposition Plan: Per Rounding Team;  Consults called: case/imaging discussed with on-call orthopedic surgeon, Dr. Allena Katz, as further detailed above Admission status: inpatient; med-tele.      Of note, this patient was added by me to the following Admit List/Treatment Team: [facility].      PLEASE NOTE THAT DRAGON DICTATION SOFTWARE WAS USED IN THE CONSTRUCTION OF THIS NOTE.   Angie Fava DO [facility] Pager [phone] From 6PM - 2AM  Otherwise, please contact night-coverage  [email or web address] Password [facility]   04/29/2021, 11:55 PM

## 2021-04-29 NOTE — Progress Notes (Signed)
Full consult note and discussion with patient/family to follow tomorrow.  Called by ED staff. Imaging reviewed.  - Plan for surgery for L hip IM nail tomorrow, likely ~1230pm - NPO after midnight - Hold anticoagulation - Admit to Hospitalist team. Please medically optimize

## 2021-04-29 NOTE — ED Triage Notes (Signed)
Pt brought in from [facility] with co fall. Pt fell this am and staff states she has a left hip fracture from fall. Brought her in tonight for hematoma to right hand and evaluation of head injury. Pt was recently started on tramadol and pt became very lethargic enroute with respirations of 8. EMS gave her 2mg  narcan and pt is now awake and yelling out.

## 2021-04-29 NOTE — Progress Notes (Signed)
Brief note regarding plan, with full H&P to follow:  87 year old female with history of dementia, chronic hypoxic respiratory failure in setting of COPD and chronic diastolic heart failure, who is admitted with acute left femoral neck fracture after fall at her SNF. On-call ortho (Dr. Allena Katz) has been consulted is planning for definitive surgical intervention tomorrow (04/30/21) via left hip IM nail. Dr. Eliane Decree additional recommendations including holding pharmacologic anticoagulation and making n.p.o. at midnight.  As needed IV fentanyl.  As needed Narcan.  Check INR and EKG. SCD's.     Newton Pigg, DO Hospitalist

## 2021-04-29 NOTE — ED Provider Notes (Signed)
[facility] Emergency Department Provider Note   ____________________________________________   Event Date/Time   First MD Initiated Contact with Patient 04/29/21 2350     (approximate)  I have reviewed the triage vital signs and the nursing notes.   HISTORY  Chief Complaint Fall    HPI [name] is a 87 y.o. female with below stated past medical history including dementia who presents from her long-term care facility today after a witnessed fall and left hip fracture.  Further history and review of systems are unable to be obtained at this time given patient's mental status         Past Medical History:  Diagnosis Date   Anxiety    CHF (congestive heart failure) (HCC)    COPD (chronic obstructive pulmonary disease) (HCC)    Depression    Diabetes mellitus without complication (HCC)    GERD (gastroesophageal reflux disease)    Hip fracture (HCC)    Hyperlipemia    Hypertension    Neuropathy    Vertigo     Patient Active Problem List   Diagnosis Date Noted   Closed left hip fracture (HCC) 04/29/2021   Acute decompensated heart failure (HCC) 10/29/2020   Acute CHF (congestive heart failure) (HCC) 10/28/2020   Pneumonia due to COVID-19 virus 10/16/2019   Elevated brain natriuretic peptide (BNP) level 10/16/2019   Lactic acidosis 10/16/2019   Acute on chronic respiratory failure with hypoxia (HCC) 10/15/2019   Suspected COVID-19 virus infection 10/15/2019   HTN (hypertension) 10/15/2019   Chronic diastolic (congestive) heart failure (HCC) 10/15/2019   Hyperkalemia 10/15/2019   CKD (chronic kidney disease), stage IIIa 10/15/2019   Hypoxia 05/23/2019   Acute delirium 08/16/2018   Dementia with behavioral disturbance (HCC) 08/16/2018   Encephalopathy acute 08/11/2018   Sepsis (HCC) 05/21/2017   Aspiration pneumonia (HCC) 05/21/2017   GERD (gastroesophageal reflux disease) 05/21/2017   HLD (hyperlipidemia) 05/21/2017   Depression  05/21/2017   Anxiety 05/21/2017   Elevated troponin 05/21/2017   Diabetes (HCC) 05/21/2017   Right humeral fracture 05/21/2017   Nausea and vomiting 03/31/2015   Microcytic anemia 03/31/2015    Past Surgical History:  Procedure Laterality Date   ABDOMINAL HYSTERECTOMY     APPENDECTOMY     BACK SURGERY     tumor removal bengin   CHOLECYSTECTOMY      Prior to Admission medications   Medication Sig Start Date End Date Taking? Authorizing Provider  acetaminophen (TYLENOL) 500 MG tablet Take 1,000 mg by mouth 3 (three) times daily.   Yes [provider]  amLODipine (NORVASC) 5 MG tablet Take 5 mg by mouth daily.   Yes [provider]  aspirin EC 81 MG tablet Take 81 mg by mouth daily.   Yes [provider]  azelastine (ASTELIN) 0.1 % nasal spray Place 1 spray into both nostrils 2 (two) times daily. Use in each nostril as directed   Yes [provider]  bismuth subsalicylate (PEPTO BISMOL) 262 MG/15ML suspension Take 262 mg by mouth every 2 (two) hours as needed for indigestion (up to 6 doses in 24 hr).    Yes [provider]  calcium carbonate (TUMS - DOSED IN MG ELEMENTAL CALCIUM) 500 MG chewable tablet Chew 2 tablets (400 mg of elemental calcium total) by mouth 3 (three) times daily as needed for indigestion or heartburn. 08/23/18  Yes Wieting, Richard, MD  Cholecalciferol (VITAMIN D) 50 MCG (2000 UT) tablet Take 2,000 Units by mouth daily.  Yes [provider]  dicyclomine (BENTYL) 20 MG tablet Take 20 mg by mouth 4 (four) times daily.   Yes [provider]  diphenoxylate-atropine (LOMOTIL) 2.5-0.025 MG tablet Take 1 tablet by mouth 2 (two) times daily as needed for diarrhea or loose stools.   Yes [provider]  divalproex (DEPAKOTE ER) 250 MG 24 hr tablet Take 250 mg by mouth at bedtime.   Yes [provider]  esomeprazole (NEXIUM) 20 MG capsule Take 20 mg by mouth daily.    Yes [provider]   feeding supplement, ENSURE ENLIVE, (ENSURE ENLIVE) LIQD Take 237 mLs by mouth 2 (two) times daily between meals. 10/23/19  Yes Ghimire, Werner Lean, MD  furosemide (LASIX) 40 MG tablet Take 1 tablet (40 mg total) by mouth daily. 11/03/20 04/29/21 Yes Sreenath, Sudheer B, MD  ipratropium-albuterol (DUONEB) 0.5-2.5 (3) MG/3ML SOLN Take 3 mLs by nebulization 2 (two) times daily. 08/23/18  Yes Wieting, Richard, MD  loperamide (IMODIUM A-D) 2 MG tablet Take 2 mg by mouth as needed for diarrhea or loose stools (up to 4 doses in 12 hrs.).   Yes [provider]  loratadine (CLARITIN) 10 MG tablet Take 10 mg by mouth daily.   Yes [provider]  LORazepam (ATIVAN) 0.5 MG tablet Take 0.25 mg by mouth every 8 (eight) hours as needed for anxiety.   Yes [provider]  magnesium hydroxide (MILK OF MAGNESIA) 400 MG/5ML suspension Take 30 mLs by mouth daily as needed for mild constipation.   Yes [provider]  melatonin 3 MG TABS tablet Take 3 mg by mouth at bedtime as needed.   Yes [provider]  metFORMIN (GLUCOPHAGE-XR) 500 MG 24 hr tablet Take 500 mg by mouth 2 (two) times a day.   Yes [provider]  metoprolol tartrate (LOPRESSOR) 25 MG tablet Take 1 tablet (25 mg total) by mouth 2 (two) times daily. 08/23/18  Yes Wieting, Richard, MD  Multiple Vitamin (MULTIVITAMIN WITH MINERALS) TABS tablet Take 1 tablet by mouth daily. 08/23/18  Yes Wieting, Richard, MD  olopatadine (PATANOL) 0.1 % ophthalmic solution Place 1 drop into both eyes daily.   Yes [provider]  ondansetron (ZOFRAN) 4 MG tablet Take 4 mg by mouth every 8 (eight) hours as needed for nausea or vomiting.   Yes [provider]  Pseudoephedrine-DM-GG (ROBITUSSIN CF PO) Take 10 mLs by mouth every 6 (six) hours as needed (cough).   Yes [provider]  QUEtiapine (SEROQUEL) 25 MG tablet Take 1 tablet (25 mg total) by mouth at bedtime as needed (psychosis,  insomnia). Patient taking differently: Take 25 mg by mouth 2 (two) times daily. 08/23/18  Yes Wieting, Richard, MD  sertraline (ZOLOFT) 50 MG tablet Take 50 mg by mouth daily. 10/23/20  Yes [provider]  sodium phosphate Pediatric (FLEET) 3.5-9.5 GM/59ML enema Place 1 enema rectally once as needed for severe constipation.   Yes [provider]  traMADol (ULTRAM) 50 MG tablet Take 50 mg by mouth 2 (two) times daily.   Yes [provider]  alum & mag hydroxide-simeth (MAALOX/MYLANTA) 200-200-20 MG/5ML suspension Take 30 mLs by mouth as needed for indigestion or heartburn.    [provider]  nystatin (MYCOSTATIN/NYSTOP) powder Apply 1 g topically 2 (two) times daily as needed (to affected area(s)).    [provider]  psyllium (METAMUCIL) 58.6 % powder Take 1 packet by mouth 2 (two) times a day. Patient not taking: Reported on 04/29/2021  [provider]    Allergies Ampicillin and Sulfa antibiotics  Family History  Problem Relation Age of Onset   Diabetes Mother    Diabetes Sister        x2   Breast cancer Sister    Heart disease Father    Heart disease Brother        x3   Bladder Cancer Neg Hx    Kidney cancer Neg Hx    Prostate cancer Neg Hx     Social History Social History   Tobacco Use   Smoking status: Never   Smokeless tobacco: Never  Substance Use Topics   Alcohol use: No    Alcohol/week: 0.0 standard drinks   Drug use: No    Review of Systems Unable to assess ____________________________________________   PHYSICAL EXAM:  VITAL SIGNS: ED Triage Vitals  Enc Vitals Group     BP 04/29/21 2203 (!) 124/56     Pulse Rate 04/29/21 2203 75     Resp 04/29/21 2203 17     Temp 04/29/21 2203 98.6 F (37 C)     Temp Source 04/29/21 2203 Oral     SpO2 04/29/21 2203 (!) 86 %     Weight 04/29/21 2216 188 lb (85.3 kg)     Height --      Head Circumference --      Peak Flow --      Pain Score --      Pain Loc  --      Pain Edu? --      Excl. in GC? --    Constitutional: Alert and disoriented. Well appearing and in no acute distress. Eyes: Conjunctivae are normal. PERRL. Head: Atraumatic. Nose: No congestion/rhinnorhea. Mouth/Throat: Mucous membranes are moist. Neck: No stridor Cardiovascular: Grossly normal heart sounds.  Good peripheral circulation. Respiratory: Normal respiratory effort.  No retractions. Gastrointestinal: Soft and nontender. No distention. Musculoskeletal: Left leg shortening with tenderness palpation over the lateral aspect of the left hip Neurologic:  No gross focal neurologic deficits are appreciated. Skin:  Skin is warm and dry. No rash noted. Psychiatric: Cooperative  ____________________________________________   LABS (all labs ordered are listed, but only abnormal results are displayed)  Labs Reviewed  CBC - Abnormal; Notable for the following components:      Result Value   RBC 3.46 (*)    Hemoglobin 9.1 (*)    HCT 28.9 (*)    RDW 16.7 (*)    All other components within normal limits  COMPREHENSIVE METABOLIC PANEL - Abnormal; Notable for the following components:   Glucose, Bld 163 (*)    BUN 37 (*)    Calcium 8.7 (*)    Albumin 3.1 (*)    All other components within normal limits  CBG MONITORING, ED - Abnormal; Notable for the following components:   Glucose-Capillary 161 (*)    All other components within normal limits  RESP PANEL BY RT-PCR (FLU A&B, COVID) ARPGX2  MAGNESIUM  MAGNESIUM  COMPREHENSIVE METABOLIC PANEL  CBC  PROTIME-INR  TYPE AND SCREEN  TROPONIN I (HIGH SENSITIVITY)  TROPONIN I (HIGH SENSITIVITY)   ____________________________________________  EKG  ED ECG REPORT I, Merwyn Katos, the attending physician, personally viewed and interpreted this ECG.  Date: 04/29/2021 EKG Time: 2201 Rate: 75 Rhythm: normal sinus rhythm QRS Axis: normal Intervals: Right bundle branch block, left anterior fascicular block ST/T Wave  abnormalities: normal Narrative Interpretation: no evidence of acute ischemia  ____________________________________________  RADIOLOGY  ED MD interpretation: Chest  x-ray, CT head, CT cervical spine showed no evidence of acute abnormalities  X-ray of the left hip and pelvis shows a mildly displaced and angulated fracture of the left femoral neck  Official radiology report(s): DG Chest 1 View  Result Date: 04/29/2021 CLINICAL DATA:  Recent fall with known left hip fracture, initial encounter EXAM: CHEST  1 VIEW COMPARISON:  10/28/2020 FINDINGS: Cardiac shadow is enlarged but stable. Aortic calcifications are again seen. Postsurgical changes are noted and stable. No focal infiltrate or sizable effusion is seen. Mild chronic interstitial changes are noted. IMPRESSION: Chronic interstitial changes without acute abnormality. Electronically Signed   By: Alcide Clever M.D.   On: 04/29/2021 22:48   CT Head Wo Contrast  Result Date: 04/29/2021 CLINICAL DATA:  87 year old female with head trauma. EXAM: CT HEAD WITHOUT CONTRAST CT CERVICAL SPINE WITHOUT CONTRAST TECHNIQUE: Multidetector CT imaging of the head and cervical spine was performed following the standard protocol without intravenous contrast. Multiplanar CT image reconstructions of the cervical spine were also generated. COMPARISON:  Head CT dated 10/08/2020. FINDINGS: Evaluation of this exam is limited due to motion artifact. CT HEAD FINDINGS Brain: Moderate age-related atrophy and chronic microvascular ischemic changes. There is no acute intracranial hemorrhage. No mass effect or midline shift. No extra-axial fluid collection. Vascular: No hyperdense vessel or unexpected calcification. Skull: Normal. Negative for fracture or focal lesion. Sinuses/Orbits: No acute finding. Other: Mild right forehead contusion. CT CERVICAL SPINE FINDINGS Alignment: No acute subluxation. Skull base and vertebrae: No acute fracture. Osteopenia. Chronic appearing  fracture of C6 with anterior wedging. Soft tissues and spinal canal: No prevertebral fluid or swelling. No visible canal hematoma. Disc levels:  Multilevel degenerative changes and facet arthropathy. Upper chest: Negative. Other: Large left thyroid nodule measuring up to 6 cm. In the setting of significant comorbidities or limited life expectancy, no follow-up recommended (ref: J Am Coll Radiol. 2015 Feb;12(2): 143-50). IMPRESSION: 1. No acute intracranial pathology. Moderate age-related atrophy and chronic microvascular ischemic changes. 2. No acute/traumatic cervical spine pathology. Multilevel degenerative changes. Electronically Signed   By: Elgie Collard M.D.   On: 04/29/2021 23:27   CT Cervical Spine Wo Contrast  Result Date: 04/29/2021 CLINICAL DATA:  87 year old female with head trauma. EXAM: CT HEAD WITHOUT CONTRAST CT CERVICAL SPINE WITHOUT CONTRAST TECHNIQUE: Multidetector CT imaging of the head and cervical spine was performed following the standard protocol without intravenous contrast. Multiplanar CT image reconstructions of the cervical spine were also generated. COMPARISON:  Head CT dated 10/08/2020. FINDINGS: Evaluation of this exam is limited due to motion artifact. CT HEAD FINDINGS Brain: Moderate age-related atrophy and chronic microvascular ischemic changes. There is no acute intracranial hemorrhage. No mass effect or midline shift. No extra-axial fluid collection. Vascular: No hyperdense vessel or unexpected calcification. Skull: Normal. Negative for fracture or focal lesion. Sinuses/Orbits: No acute finding. Other: Mild right forehead contusion. CT CERVICAL SPINE FINDINGS Alignment: No acute subluxation. Skull base and vertebrae: No acute fracture. Osteopenia. Chronic appearing fracture of C6 with anterior wedging. Soft tissues and spinal canal: No prevertebral fluid or swelling. No visible canal hematoma. Disc levels:  Multilevel degenerative changes and facet arthropathy. Upper chest:  Negative. Other: Large left thyroid nodule measuring up to 6 cm. In the setting of significant comorbidities or limited life expectancy, no follow-up recommended (ref: J Am Coll Radiol. 2015 Feb;12(2): 143-50). IMPRESSION: 1. No acute intracranial pathology. Moderate age-related atrophy and chronic microvascular ischemic changes. 2. No acute/traumatic cervical spine pathology. Multilevel degenerative changes. Electronically Signed  By: Elgie Collard M.D.   On: 04/29/2021 23:27   DG Hip Unilat W or Wo Pelvis 2-3 Views Left  Result Date: 04/29/2021 CLINICAL DATA:  87 year old female with fall and left hip pain. EXAM: DG HIP (WITH OR WITHOUT PELVIS) 2-3V LEFT COMPARISON:  None. FINDINGS: There is a mildly displaced and angulated fracture of the left femoral neck predominantly involving the intertrochanteric and subtrochanteric region. There is varus angulation. No dislocation. The bones are osteopenic. The soft tissues are unremarkable. IMPRESSION: Mildly displaced and angulated fracture of the left femoral neck. No dislocation. Electronically Signed   By: Elgie Collard M.D.   On: 04/29/2021 22:53    ____________________________________________   PROCEDURES  Procedure(s) performed (including Critical Care):  .1-3 Lead EKG Interpretation  Date/Time: 04/30/2021 12:00 AM Performed by: Merwyn Katos, MD Authorized by: Merwyn Katos, MD     Interpretation: normal     ECG rate:  74   ECG rate assessment: normal     Rhythm: sinus rhythm     Ectopy: none     Conduction: normal     ____________________________________________   INITIAL IMPRESSION / ASSESSMENT AND PLAN / ED COURSE  As part of my medical decision making, I reviewed the following data within the electronic MEDICAL RECORD NUMBER Nursing notes reviewed and incorporated, Labs reviewed, EKG interpreted, Old chart reviewed, Radiograph reviewed and Notes from prior ED visits reviewed and incorporated        Patient is a  87 year old female that presents for left hip pain Workup: XR hip Findings: Left femoral neck fracture without dislocation Consult: Orthopedic Surgery (query fascia iliacus block vs continued opiate pain control), hospitalist  Patient does not currently demonstrate complications of fracture such as compartment syndrome, arterial or nerve injury.  Interventions: analgesia Disposition: Admit      ____________________________________________   FINAL CLINICAL IMPRESSION(S) / ED DIAGNOSES  Final diagnoses:  Fall, initial encounter  Closed displaced fracture of left femoral neck [facility])     ED Discharge Orders     None        Note:  This document was prepared using Dragon voice recognition software and may include unintentional dictation errors.    Merwyn Katos, MD 04/30/21 0000

## 2021-04-29 NOTE — ED Notes (Signed)
Patient transported to X-ray 

## 2021-04-30 ENCOUNTER — Inpatient Hospital Stay: Payer: Medicare Other | Admitting: Anesthesiology

## 2021-04-30 ENCOUNTER — Other Ambulatory Visit: Payer: Self-pay

## 2021-04-30 ENCOUNTER — Encounter: Payer: Self-pay | Admitting: Internal Medicine

## 2021-04-30 ENCOUNTER — Inpatient Hospital Stay: Payer: Medicare Other

## 2021-04-30 ENCOUNTER — Encounter
Admission: EM | Disposition: A | Discharge status: Skilled Nursing Facility | Payer: Self-pay | Source: Skilled Nursing Facility | Attending: Obstetrics and Gynecology

## 2021-04-30 DIAGNOSIS — Z01810 Encounter for preprocedural cardiovascular examination: Secondary | ICD-10-CM

## 2021-04-30 DIAGNOSIS — J9611 Chronic respiratory failure with hypoxia: Secondary | ICD-10-CM | POA: Diagnosis present

## 2021-04-30 DIAGNOSIS — D649 Anemia, unspecified: Secondary | ICD-10-CM | POA: Diagnosis present

## 2021-04-30 DIAGNOSIS — W19XXXA Unspecified fall, initial encounter: Secondary | ICD-10-CM | POA: Diagnosis present

## 2021-04-30 HISTORY — PX: INTRAMEDULLARY (IM) NAIL INTERTROCHANTERIC: SHX5875

## 2021-04-30 LAB — COMPREHENSIVE METABOLIC PANEL
ALT: 20 U/L (ref 0–44)
AST: 17 U/L (ref 15–41)
Albumin: 3.1 g/dL — ABNORMAL LOW (ref 3.5–5.0)
Alkaline Phosphatase: 76 U/L (ref 38–126)
Anion gap: 7 (ref 5–15)
BUN: 40 mg/dL — ABNORMAL HIGH (ref 8–23)
CO2: 26 mmol/L (ref 22–32)
Calcium: 8.7 mg/dL — ABNORMAL LOW (ref 8.9–10.3)
Chloride: 108 mmol/L (ref 98–111)
Creatinine, Ser: 1.07 mg/dL — ABNORMAL HIGH (ref 0.44–1.00)
GFR, Estimated: 51 mL/min — ABNORMAL LOW (ref >60)
Glucose, Bld: 143 mg/dL — ABNORMAL HIGH (ref 70–99)
Potassium: 4.8 mmol/L (ref 3.5–5.1)
Sodium: 141 mmol/L (ref 135–145)
Total Bilirubin: 0.7 mg/dL (ref 0.3–1.2)
Total Protein: 6.6 g/dL (ref 6.5–8.1)

## 2021-04-30 LAB — GLUCOSE, CAPILLARY
Glucose-Capillary: 142 mg/dL — ABNORMAL HIGH (ref 70–99)
Glucose-Capillary: 142 mg/dL — ABNORMAL HIGH (ref 70–99)
Glucose-Capillary: 135 mg/dL — ABNORMAL HIGH (ref 70–99)
Glucose-Capillary: 136 mg/dL — ABNORMAL HIGH (ref 70–99)

## 2021-04-30 LAB — RETICULOCYTES
Immature Retic Fract: 26.7 % — ABNORMAL HIGH (ref 2.3–15.9)
RBC.: 3.38 MIL/uL — ABNORMAL LOW (ref 3.87–5.11)
Retic Count, Absolute: 76.7 10*3/uL (ref 19.0–186.0)
Retic Ct Pct: 2.3 % (ref 0.4–3.1)

## 2021-04-30 LAB — IRON AND TIBC
Iron: 26 ug/dL — ABNORMAL LOW (ref 28–170)
Saturation Ratios: 9 % — ABNORMAL LOW (ref 10.4–31.8)
TIBC: 298 ug/dL (ref 250–450)
UIBC: 272 ug/dL

## 2021-04-30 LAB — VITAMIN D 25 HYDROXY (VIT D DEFICIENCY, FRACTURES): Vit D, 25-Hydroxy: 39.1 ng/mL (ref 30–100)

## 2021-04-30 LAB — RESP PANEL BY RT-PCR (FLU A&B, COVID) ARPGX2
Influenza A by PCR: NEGATIVE
Influenza B by PCR: NEGATIVE
SARS Coronavirus 2 by RT PCR: NEGATIVE

## 2021-04-30 LAB — MAGNESIUM
Magnesium: 1.8 mg/dL (ref 1.7–2.4)
Magnesium: 2 mg/dL (ref 1.7–2.4)

## 2021-04-30 LAB — FERRITIN: Ferritin: 114 ng/mL (ref 11–307)

## 2021-04-30 LAB — PROTIME-INR
INR: 1.2 (ref 0.8–1.2)
Prothrombin Time: 15.3 seconds — ABNORMAL HIGH (ref 11.4–15.2)

## 2021-04-30 LAB — CBC
HCT: 29.1 % — ABNORMAL LOW (ref 36.0–46.0)
Hemoglobin: 8.8 g/dL — ABNORMAL LOW (ref 12.0–15.0)
MCH: 25.8 pg — ABNORMAL LOW (ref 26.0–34.0)
MCHC: 30.2 g/dL (ref 30.0–36.0)
MCV: 85.3 fL (ref 80.0–100.0)
Platelets: 178 10*3/uL (ref 150–400)
RBC: 3.41 MIL/uL — ABNORMAL LOW (ref 3.87–5.11)
RDW: 16.8 % — ABNORMAL HIGH (ref 11.5–15.5)
WBC: 8.7 10*3/uL (ref 4.0–10.5)
nRBC: 0 % (ref 0.0–0.2)

## 2021-04-30 LAB — TROPONIN I (HIGH SENSITIVITY): Troponin I (High Sensitivity): 11 ng/L (ref <18)

## 2021-04-30 LAB — FOLATE: Folate: 16 ng/mL (ref >5.9)

## 2021-04-30 SURGERY — FIXATION, FRACTURE, INTERTROCHANTERIC, WITH INTRAMEDULLARY ROD
Anesthesia: Spinal | Laterality: Left

## 2021-04-30 MED ORDER — METOPROLOL TARTRATE 25 MG PO TABS: 25.0000 mg | ORAL_TABLET | Freq: Two times a day (BID) | ORAL | Status: DC

## 2021-04-30 MED ORDER — BISACODYL 10 MG RE SUPP
10.0000 mg | Freq: Every day | RECTAL | Status: DC | PRN
  Filled 2021-04-30: qty 1

## 2021-04-30 MED ORDER — OXYCODONE HCL 5 MG PO TABS
2.5000 mg | ORAL_TABLET | ORAL | Status: DC | PRN
  Filled 2021-04-30: qty 1

## 2021-04-30 MED ORDER — SODIUM CHLORIDE 0.9 % IV SOLN: INTRAVENOUS | Status: DC

## 2021-04-30 MED ORDER — KETOROLAC TROMETHAMINE 15 MG/ML IJ SOLN
7.5000 mg | Freq: Four times a day (QID) | INTRAMUSCULAR | Status: AC
  Administered 2021-04-30 – 2021-05-01 (×4): 7.5 mg via INTRAVENOUS
  Filled 2021-04-30 (×4): qty 1

## 2021-04-30 MED ORDER — ONDANSETRON HCL 4 MG/2ML IJ SOLN: 4.0000 mg | Freq: Four times a day (QID) | INTRAMUSCULAR | Status: DC | PRN

## 2021-04-30 MED ORDER — CEFAZOLIN SODIUM-DEXTROSE 2-4 GM/100ML-% IV SOLN: 2.0000 g | Freq: Once | INTRAVENOUS | Status: DC

## 2021-04-30 MED ORDER — BUPIVACAINE HCL (PF) 0.5 % IJ SOLN
INTRAMUSCULAR | Status: DC | PRN
  Administered 2021-04-30: 3 mL

## 2021-04-30 MED ORDER — PHENYLEPHRINE HCL (PRESSORS) 10 MG/ML IV SOLN
INTRAVENOUS | Status: DC | PRN
  Administered 2021-04-30 (×3): 100 ug via INTRAVENOUS

## 2021-04-30 MED ORDER — PROPOFOL 10 MG/ML IV BOLUS
INTRAVENOUS | Status: AC
  Filled 2021-04-30: qty 20

## 2021-04-30 MED ORDER — SODIUM CHLORIDE 0.9 % IV SOLN
INTRAVENOUS | Status: DC | PRN
  Administered 2021-04-30: 50 ug/min via INTRAVENOUS

## 2021-04-30 MED ORDER — HYDROMORPHONE HCL 1 MG/ML IJ SOLN: 0.2000 mg | INTRAMUSCULAR | Status: DC | PRN

## 2021-04-30 MED ORDER — FENTANYL CITRATE (PF) 100 MCG/2ML IJ SOLN
INTRAMUSCULAR | Status: DC | PRN
  Administered 2021-04-30: 25 ug via INTRAVENOUS
  Administered 2021-04-30: 50 ug via INTRAVENOUS
  Administered 2021-04-30: 25 ug via INTRAVENOUS

## 2021-04-30 MED ORDER — POLYETHYLENE GLYCOL 3350 17 G PO PACK: 17.0000 g | PACK | Freq: Every day | ORAL | Status: DC

## 2021-04-30 MED ORDER — ONDANSETRON HCL 4 MG/2ML IJ SOLN: 4.0000 mg | Freq: Once | INTRAMUSCULAR | Status: DC | PRN

## 2021-04-30 MED ORDER — PROPOFOL 500 MG/50ML IV EMUL
INTRAVENOUS | Status: DC | PRN
  Administered 2021-04-30: 75 ug/kg/min via INTRAVENOUS

## 2021-04-30 MED ORDER — PROPOFOL 500 MG/50ML IV EMUL
INTRAVENOUS | Status: AC
  Filled 2021-04-30: qty 50

## 2021-04-30 MED ORDER — OXYCODONE HCL 5 MG PO TABS
5.0000 mg | ORAL_TABLET | ORAL | Status: DC | PRN
  Administered 2021-04-30: 5 mg via ORAL
  Administered 2021-05-01 (×2): 10 mg via ORAL
  Administered 2021-05-03: 5 mg via ORAL
  Administered 2021-05-03: 10 mg via ORAL
  Administered 2021-05-04: 5 mg via ORAL
  Administered 2021-05-04: 10 mg via ORAL
  Filled 2021-04-30 (×3): qty 2
  Filled 2021-04-30 (×2): qty 1
  Filled 2021-04-30: qty 2

## 2021-04-30 MED ORDER — SENNOSIDES-DOCUSATE SODIUM 8.6-50 MG PO TABS
1.0000 | ORAL_TABLET | Freq: Every evening | ORAL | Status: DC | PRN
  Filled 2021-04-30: qty 1

## 2021-04-30 MED ORDER — METOPROLOL TARTRATE 25 MG PO TABS
25.0000 mg | ORAL_TABLET | Freq: Two times a day (BID) | ORAL | Status: DC
  Administered 2021-04-30 – 2021-05-04 (×9): 25 mg via ORAL
  Filled 2021-04-30 (×9): qty 1

## 2021-04-30 MED ORDER — FENTANYL CITRATE (PF) 100 MCG/2ML IJ SOLN
INTRAMUSCULAR | Status: AC
  Filled 2021-04-30: qty 2

## 2021-04-30 MED ORDER — SERTRALINE HCL 50 MG PO TABS
50.0000 mg | ORAL_TABLET | Freq: Every day | ORAL | Status: DC
  Administered 2021-05-01 – 2021-05-04 (×4): 50 mg via ORAL
  Filled 2021-04-30 (×4): qty 1

## 2021-04-30 MED ORDER — IPRATROPIUM-ALBUTEROL 0.5-2.5 (3) MG/3ML IN SOLN
3.0000 mL | Freq: Two times a day (BID) | RESPIRATORY_TRACT | Status: DC
  Administered 2021-04-30 – 2021-05-04 (×8): 3 mL via RESPIRATORY_TRACT
  Filled 2021-04-30 (×7): qty 3

## 2021-04-30 MED ORDER — ONDANSETRON HCL 4 MG PO TABS: 4.0000 mg | ORAL_TABLET | Freq: Four times a day (QID) | ORAL | Status: DC | PRN

## 2021-04-30 MED ORDER — CEFAZOLIN SODIUM-DEXTROSE 2-3 GM-%(50ML) IV SOLR
INTRAVENOUS | Status: DC | PRN
  Administered 2021-04-30: 2 g via INTRAVENOUS

## 2021-04-30 MED ORDER — NALOXONE HCL 2 MG/2ML IJ SOSY
0.4000 mg | PREFILLED_SYRINGE | INTRAMUSCULAR | Status: DC | PRN
  Filled 2021-04-30: qty 2

## 2021-04-30 MED ORDER — CHLORHEXIDINE GLUCONATE CLOTH 2 % EX PADS
6.0000 | MEDICATED_PAD | Freq: Every day | CUTANEOUS | Status: DC
  Administered 2021-04-30 – 2021-05-02 (×3): 6 via TOPICAL

## 2021-04-30 MED ORDER — MORPHINE SULFATE (PF) 2 MG/ML IV SOLN
1.0000 mg | INTRAVENOUS | Status: DC | PRN
  Administered 2021-04-30: 1 mg via INTRAVENOUS
  Filled 2021-04-30: qty 1

## 2021-04-30 MED ORDER — METHOCARBAMOL 1000 MG/10ML IJ SOLN
500.0000 mg | Freq: Four times a day (QID) | INTRAVENOUS | Status: DC | PRN
  Filled 2021-04-30: qty 5

## 2021-04-30 MED ORDER — TRAMADOL HCL 50 MG PO TABS: 50.0000 mg | ORAL_TABLET | Freq: Two times a day (BID) | ORAL | Status: DC

## 2021-04-30 MED ORDER — QUETIAPINE FUMARATE 25 MG PO TABS: 25.0000 mg | ORAL_TABLET | Freq: Two times a day (BID) | ORAL | Status: DC

## 2021-04-30 MED ORDER — DIVALPROEX SODIUM ER 250 MG PO TB24
250.0000 mg | ORAL_TABLET | Freq: Every day | ORAL | Status: DC
  Administered 2021-04-30 – 2021-05-03 (×4): 250 mg via ORAL
  Filled 2021-04-30 (×5): qty 1

## 2021-04-30 MED ORDER — TRANEXAMIC ACID-NACL 1000-0.7 MG/100ML-% IV SOLN
1000.0000 mg | INTRAVENOUS | Status: DC
  Filled 2021-04-30: qty 100

## 2021-04-30 MED ORDER — ALBUTEROL SULFATE (2.5 MG/3ML) 0.083% IN NEBU: 2.5000 mg | INHALATION_SOLUTION | RESPIRATORY_TRACT | Status: DC | PRN

## 2021-04-30 MED ORDER — TRAMADOL HCL 50 MG PO TABS
50.0000 mg | ORAL_TABLET | Freq: Four times a day (QID) | ORAL | Status: DC | PRN
Start: 2021-04-30 — End: 2021-05-02

## 2021-04-30 MED ORDER — BUPIVACAINE LIPOSOME 1.3 % IJ SUSP
INTRAMUSCULAR | Status: DC | PRN
  Administered 2021-04-30: 20 mL

## 2021-04-30 MED ORDER — FENTANYL CITRATE (PF) 100 MCG/2ML IJ SOLN: 25.0000 ug | INTRAMUSCULAR | Status: DC | PRN

## 2021-04-30 MED ORDER — INSULIN ASPART 100 UNIT/ML IJ SOLN
0.0000 [IU] | Freq: Four times a day (QID) | INTRAMUSCULAR | Status: DC
  Administered 2021-05-01: 1 [IU] via SUBCUTANEOUS
  Filled 2021-04-30: qty 1

## 2021-04-30 MED ORDER — METOCLOPRAMIDE HCL 10 MG PO TABS: 5.0000 mg | ORAL_TABLET | Freq: Three times a day (TID) | ORAL | Status: DC | PRN

## 2021-04-30 MED ORDER — METOCLOPRAMIDE HCL 5 MG/ML IJ SOLN: 5.0000 mg | Freq: Three times a day (TID) | INTRAMUSCULAR | Status: DC | PRN

## 2021-04-30 MED ORDER — ACETAMINOPHEN 500 MG PO TABS
1000.0000 mg | ORAL_TABLET | Freq: Three times a day (TID) | ORAL | Status: DC
  Administered 2021-04-30 – 2021-05-04 (×13): 1000 mg via ORAL
  Filled 2021-04-30 (×13): qty 2

## 2021-04-30 MED ORDER — BUPIVACAINE HCL (PF) 0.5 % IJ SOLN
INTRAMUSCULAR | Status: DC | PRN
  Administered 2021-04-30: 20 mL

## 2021-04-30 MED ORDER — FLEET ENEMA 7-19 GM/118ML RE ENEM: 1.0000 | ENEMA | Freq: Once | RECTAL | Status: DC | PRN

## 2021-04-30 MED ORDER — FENTANYL CITRATE (PF) 100 MCG/2ML IJ SOLN: 12.5000 ug | INTRAMUSCULAR | Status: DC | PRN

## 2021-04-30 MED ORDER — DOCUSATE SODIUM 100 MG PO CAPS
100.0000 mg | ORAL_CAPSULE | Freq: Two times a day (BID) | ORAL | Status: DC
  Administered 2021-04-30 – 2021-05-03 (×7): 100 mg via ORAL
  Filled 2021-04-30 (×8): qty 1

## 2021-04-30 MED ORDER — SODIUM CHLORIDE 0.9 % IR SOLN
Status: DC | PRN
  Administered 2021-04-30: 1000 mL

## 2021-04-30 MED ORDER — TRANEXAMIC ACID 1000 MG/10ML IV SOLN
1000.0000 mg | Freq: Once | INTRAVENOUS | Status: AC
  Administered 2021-04-30: 1000 mg via INTRAVENOUS

## 2021-04-30 MED ORDER — ENOXAPARIN SODIUM 40 MG/0.4ML IJ SOSY
40.0000 mg | PREFILLED_SYRINGE | INTRAMUSCULAR | Status: DC
  Administered 2021-05-01 – 2021-05-04 (×4): 40 mg via SUBCUTANEOUS
  Filled 2021-04-30 (×4): qty 0.4

## 2021-04-30 MED ORDER — SODIUM CHLORIDE 0.9 % IV SOLN: INTRAVENOUS | Status: DC | PRN

## 2021-04-30 MED ORDER — CEFAZOLIN SODIUM-DEXTROSE 2-4 GM/100ML-% IV SOLN
2.0000 g | Freq: Four times a day (QID) | INTRAVENOUS | Status: AC
  Administered 2021-04-30 – 2021-05-01 (×3): 2 g via INTRAVENOUS
  Filled 2021-04-30 (×3): qty 100

## 2021-04-30 MED ORDER — METHOCARBAMOL 500 MG PO TABS
500.0000 mg | ORAL_TABLET | Freq: Four times a day (QID) | ORAL | Status: DC | PRN
  Administered 2021-04-30 – 2021-05-04 (×5): 500 mg via ORAL
  Filled 2021-04-30 (×5): qty 1

## 2021-04-30 MED ORDER — PHENYLEPHRINE HCL (PRESSORS) 10 MG/ML IV SOLN
INTRAVENOUS | Status: AC
  Filled 2021-04-30: qty 1

## 2021-04-30 MED ORDER — PROPOFOL 10 MG/ML IV BOLUS
INTRAVENOUS | Status: DC | PRN
  Administered 2021-04-30 (×3): 20 mg via INTRAVENOUS

## 2021-04-30 MED ORDER — LORAZEPAM 2 MG/ML IJ SOLN
0.5000 mg | Freq: Four times a day (QID) | INTRAMUSCULAR | Status: DC | PRN
  Administered 2021-05-01 – 2021-05-03 (×6): 0.5 mg via INTRAVENOUS
  Filled 2021-04-30 (×6): qty 1

## 2021-04-30 SURGICAL SUPPLY — 49 items
"PENCIL ELECTRO HAND CTR " (MISCELLANEOUS) ×1 IMPLANT
BIT DRILL INTERTAN LAG SCREW (BIT) ×2 IMPLANT
BIT DRILL SHORT 4.0 (BIT) IMPLANT
BLADE SURG 15 STRL LF DISP TIS (BLADE) ×1 IMPLANT
BLADE SURG 15 STRL SS (BLADE) ×2
CHLORAPREP W/TINT 26 (MISCELLANEOUS) ×3 IMPLANT
COVER WAND RF STERILE (DRAPES) ×3 IMPLANT
DRAPE 3/4 80X56 (DRAPES) ×3 IMPLANT
DRAPE SURG 17X11 SM STRL (DRAPES) ×6 IMPLANT
DRAPE U-SHAPE 47X51 STRL (DRAPES) ×6 IMPLANT
DRILL BIT SHORT 4.0 (BIT) ×2
DRSG OPSITE POSTOP 3X4 (GAUZE/BANDAGES/DRESSINGS) ×7 IMPLANT
DRSG OPSITE POSTOP 4X6 (GAUZE/BANDAGES/DRESSINGS) ×2 IMPLANT
ELECT REM PT RETURN 9FT ADLT (ELECTROSURGICAL) ×3
ELECTRODE REM PT RTRN 9FT ADLT (ELECTROSURGICAL) ×1 IMPLANT
GLOVE SRG 8 PF TXTR STRL LF DI (GLOVE) ×1 IMPLANT
GLOVE SURG SYN 7.5  E (GLOVE) ×2
GLOVE SURG SYN 7.5 E (GLOVE) ×1 IMPLANT
GLOVE SURG SYN 7.5 PF PI (GLOVE) ×1 IMPLANT
GLOVE SURG UNDER POLY LF SZ8 (GLOVE) ×2
GOWN STRL REUS W/ TWL LRG LVL3 (GOWN DISPOSABLE) ×1 IMPLANT
GOWN STRL REUS W/ TWL XL LVL3 (GOWN DISPOSABLE) ×1 IMPLANT
GOWN STRL REUS W/TWL LRG LVL3 (GOWN DISPOSABLE) ×2
GOWN STRL REUS W/TWL XL LVL3 (GOWN DISPOSABLE) ×2
GUIDE PIN 3.2X343 (PIN) ×4
GUIDE PIN 3.2X343MM (PIN) ×8
GUIDE ROD 3.0 (MISCELLANEOUS) ×3
KIT PATIENT CARE HANA TABLE (KITS) ×3 IMPLANT
KIT TURNOVER KIT A (KITS) ×3 IMPLANT
MANIFOLD NEPTUNE II (INSTRUMENTS) ×3 IMPLANT
MAT ABSORB  FLUID 56X50 GRAY (MISCELLANEOUS) ×2
MAT ABSORB FLUID 56X50 GRAY (MISCELLANEOUS) ×2 IMPLANT
NAIL TRIGEN INTERTAN 10S 340MM (Orthopedic Implant) ×2 IMPLANT
NDL FILTER BLUNT 18X1 1/2 (NEEDLE) ×1 IMPLANT
NEEDLE FILTER BLUNT 18X 1/2SAF (NEEDLE)
NEEDLE FILTER BLUNT 18X1 1/2 (NEEDLE) IMPLANT
NEEDLE HYPO 22GX1.5 SAFETY (NEEDLE) ×3 IMPLANT
NS IRRIG 1000ML POUR BTL (IV SOLUTION) ×3 IMPLANT
PACK HIP COMPR (MISCELLANEOUS) ×3 IMPLANT
PENCIL ELECTRO HAND CTR (MISCELLANEOUS) ×3 IMPLANT
PIN GUIDE 3.2X343MM (PIN) IMPLANT
ROD GUIDE 3.0 (MISCELLANEOUS) IMPLANT
SCREW LAG COMPR KIT 95/90 (Screw) ×2 IMPLANT
SCREW TRIGEN LOW PROF 5.0X40 (Screw) ×2 IMPLANT
STAPLER SKIN PROX 35W (STAPLE) ×3 IMPLANT
SUT VIC AB 2-0 CT2 27 (SUTURE) ×3 IMPLANT
SYR 10ML LL (SYRINGE) ×3 IMPLANT
SYR 30ML LL (SYRINGE) ×3 IMPLANT
TAPE CLOTH 3X10 WHT NS LF (GAUZE/BANDAGES/DRESSINGS) ×4 IMPLANT

## 2021-04-30 NOTE — Anesthesia Preprocedure Evaluation (Signed)
Anesthesia Evaluation  Patient identified by MRN, date of birth, ID band Patient awake    Reviewed: Allergy & Precautions, H&P , NPO status , Patient's Chart, lab work & pertinent test results, reviewed documented beta blocker date and time   Airway Mallampati: II   Neck ROM: full    Dental  (+) Poor Dentition   Pulmonary neg pulmonary ROS, pneumonia, COPD,    Pulmonary exam normal        Cardiovascular Exercise Tolerance: Poor hypertension, On Medications +CHF  negative cardio ROS Normal cardiovascular exam Rhythm:regular Rate:Normal     Neuro/Psych PSYCHIATRIC DISORDERS Anxiety Depression Dementia negative neurological ROS  negative psych ROS   GI/Hepatic negative GI ROS, Neg liver ROS, GERD  Medicated,  Endo/Other  negative endocrine ROSdiabetes  Renal/GU CRFRenal diseasenegative Renal ROS  negative genitourinary   Musculoskeletal   Abdominal   Peds  Hematology negative hematology ROS (+) Blood dyscrasia, anemia ,   Anesthesia Other Findings Past Medical History: No date: Anxiety No date: CHF (congestive heart failure) (HCC) No date: COPD (chronic obstructive pulmonary disease) (HCC) No date: Dementia (HCC) No date: Depression No date: Diabetes mellitus without complication (HCC) No date: GERD (gastroesophageal reflux disease) No date: Hip fracture (HCC) No date: Hyperlipemia No date: Hypertension No date: Neuropathy No date: Vertigo Past Surgical History: No date: ABDOMINAL HYSTERECTOMY No date: APPENDECTOMY No date: BACK SURGERY     Comment:  tumor removal bengin No date: CHOLECYSTECTOMY BMI    Body Mass Index: 31.28 kg/m     Reproductive/Obstetrics negative OB ROS                             Anesthesia Physical Anesthesia Plan  ASA: 4  Anesthesia Plan: Spinal   Post-op Pain Management:    Induction:   PONV Risk Score and Plan:   Airway Management Planned:    Additional Equipment:   Intra-op Plan:   Post-operative Plan:   Informed Consent: I have reviewed the patients History and Physical, chart, labs and discussed the procedure including the risks, benefits and alternatives for the proposed anesthesia with the patient or authorized representative who has indicated his/her understanding and acceptance.     Dental Advisory Given  Plan Discussed with: CRNA  Anesthesia Plan Comments:         Anesthesia Quick Evaluation

## 2021-04-30 NOTE — Progress Notes (Signed)
Pt with no urine output and urge to urinate, bladder scan 310. On call doc informed plan for in and out cath same done see chart

## 2021-04-30 NOTE — Transfer of Care (Signed)
Immediate Anesthesia Transfer of Care Note  Patient: [name]  Procedure(s) Performed: INTRAMEDULLARY (IM) NAIL INTERTROCHANTRIC (Left)  Patient Location: PACU  Anesthesia Type:Spinal  Level of Consciousness: sedated  Airway & Oxygen Therapy: Patient Spontanous Breathing and Patient connected to face mask oxygen  Post-op Assessment: Report given to RN and Post -op Vital signs reviewed and stable  Post vital signs: Reviewed and stable  Last Vitals:  Vitals Value Taken Time  BP    Temp    Pulse 88 04/30/21 1528  Resp 19 04/30/21 1528  SpO2 97 % 04/30/21 1528  Vitals shown include unvalidated device data.  Last Pain:  Vitals:   04/30/21 1227  TempSrc: Temporal  PainSc:          Complications: No notable events documented.

## 2021-04-30 NOTE — ED Notes (Addendum)
Pt with BM and urination. Pt cleaned and new linen applied as well as brief and chucks pads. All belongings soiled placed into blue bag and tied. Will transport with pt to floor room.

## 2021-04-30 NOTE — OR Nursing (Signed)
Patient arrive to operating room with silver ring in place on left hand 3rd finger. It was removed and placed into a bag with patient identification sticker in place. Placed into patient's chart.

## 2021-04-30 NOTE — Progress Notes (Signed)
Tele monitor show st elevation of 1.4 on cal doc informed plan for an ekg same done for review

## 2021-04-30 NOTE — Plan of Care (Signed)
  Problem: Education: Goal: Knowledge of General Education information will improve Description: Including pain rating scale, medication(s)/side effects and non-pharmacologic comfort measures Outcome: Progressing   Problem: Health Behavior/Discharge Planning: Goal: Ability to manage health-related needs will improve Outcome: Progressing   Problem: Clinical Measurements: Goal: Ability to maintain clinical measurements within normal limits will improve Outcome: Progressing Goal: Will remain free from infection Outcome: Progressing Goal: Respiratory complications will improve Outcome: Progressing   Problem: Activity: Goal: Risk for activity intolerance will decrease Outcome: Progressing   Problem: Nutrition: Goal: Adequate nutrition will be maintained Outcome: Progressing   Problem: Coping: Goal: Level of anxiety will decrease Outcome: Progressing   Problem: Elimination: Goal: Will not experience complications related to bowel motility Outcome: Progressing   Problem: Pain Managment: Goal: General experience of comfort will improve Outcome: Progressing   Problem: Safety: Goal: Ability to remain free from injury will improve Outcome: Progressing   Problem: Skin Integrity: Goal: Risk for impaired skin integrity will decrease Outcome: Progressing   

## 2021-04-30 NOTE — Consult Note (Signed)
ORTHOPAEDIC CONSULTATION  REQUESTING PHYSICIAN: Wouk, Wilfred Curtis, MD  Chief Complaint:   L hip pain  History of Present Illness: History obtained from discussion with the patient son, medical staff, and review of chart due to patient's dementia.  [name] is a 87 y.o. female with a history of dementia who had a fall at her assisted living facility yesterday.  The patient noted immediate hip pain and inability to ambulate.  The patient ambulates with a walker at baseline.  The patient lives at an assisted living facility.  Pain is worse with any sort of movement.  X-rays in the emergency department showed a left intertrochanteric hip fracture.  Past Medical History:  Diagnosis Date   Anxiety    CHF (congestive heart failure) (HCC)    COPD (chronic obstructive pulmonary disease) (HCC)    Dementia (HCC)    Depression    Diabetes mellitus without complication (HCC)    GERD (gastroesophageal reflux disease)    Hip fracture (HCC)    Hyperlipemia    Hypertension    Neuropathy    Vertigo    Past Surgical History:  Procedure Laterality Date   ABDOMINAL HYSTERECTOMY     APPENDECTOMY     BACK SURGERY     tumor removal bengin   CHOLECYSTECTOMY     Social History   Socioeconomic History   Marital status: Widowed    Spouse name: Not on file   Number of children: Not on file   Years of education: Not on file   Highest education level: Not on file  Occupational History   Occupation: Retired  Tobacco Use   Smoking status: Never   Smokeless tobacco: Never  Substance and Sexual Activity   Alcohol use: No    Alcohol/week: 0.0 standard drinks   Drug use: No   Sexual activity: Not on file  Other Topics Concern   Not on file  Social History Narrative   Not on file   Social Determinants of Health   Financial Resource Strain: Not on file  Food Insecurity: Not on file  Transportation Needs: Not on file   Physical Activity: Not on file  Stress: Not on file  Social Connections: Not on file   Family History  Problem Relation Age of Onset   Diabetes Mother    Diabetes Sister        x2   Breast cancer Sister    Heart disease Father    Heart disease Brother        x3   Bladder Cancer Neg Hx    Kidney cancer Neg Hx    Prostate cancer Neg Hx    Allergies  Allergen Reactions   Ampicillin     Zpac   Sulfa Antibiotics    Prior to Admission medications   Medication Sig Start Date End Date Taking? Authorizing Provider  acetaminophen (TYLENOL) 500 MG tablet Take 1,000 mg by mouth 3 (three) times daily.   Yes [provider]  amLODipine (NORVASC) 5 MG tablet Take 5 mg by mouth daily.   Yes [provider]  aspirin EC 81 MG tablet Take 81 mg by mouth daily.   Yes [provider]  azelastine (ASTELIN) 0.1 % nasal spray Place 1 spray into both nostrils 2 (two) times daily. Use in each nostril as directed   Yes [provider]  bismuth subsalicylate (PEPTO BISMOL) 262 MG/15ML suspension Take 262 mg by mouth every 2 (two) hours as needed for indigestion (up to 6 doses  in 24 hr).    Yes [provider]  calcium carbonate (TUMS - DOSED IN MG ELEMENTAL CALCIUM) 500 MG chewable tablet Chew 2 tablets (400 mg of elemental calcium total) by mouth 3 (three) times daily as needed for indigestion or heartburn. 08/23/18  Yes Wieting, Richard, MD  Cholecalciferol (VITAMIN D) 50 MCG (2000 UT) tablet Take 2,000 Units by mouth daily.   Yes [provider]  dicyclomine (BENTYL) 20 MG tablet Take 20 mg by mouth 4 (four) times daily.   Yes [provider]  diphenoxylate-atropine (LOMOTIL) 2.5-0.025 MG tablet Take 1 tablet by mouth 2 (two) times daily as needed for diarrhea or loose stools.   Yes [provider]  divalproex (DEPAKOTE ER) 250 MG 24 hr tablet Take 250 mg by mouth at bedtime.   Yes [provider]  esomeprazole (NEXIUM) 20  MG capsule Take 20 mg by mouth daily.    Yes [provider]  feeding supplement, ENSURE ENLIVE, (ENSURE ENLIVE) LIQD Take 237 mLs by mouth 2 (two) times daily between meals. 10/23/19  Yes Ghimire, Werner Lean, MD  furosemide (LASIX) 40 MG tablet Take 1 tablet (40 mg total) by mouth daily. 11/03/20 04/29/21 Yes Sreenath, Sudheer B, MD  ipratropium-albuterol (DUONEB) 0.5-2.5 (3) MG/3ML SOLN Take 3 mLs by nebulization 2 (two) times daily. 08/23/18  Yes Wieting, Richard, MD  loperamide (IMODIUM A-D) 2 MG tablet Take 2 mg by mouth as needed for diarrhea or loose stools (up to 4 doses in 12 hrs.).   Yes [provider]  loratadine (CLARITIN) 10 MG tablet Take 10 mg by mouth daily.   Yes [provider]  LORazepam (ATIVAN) 0.5 MG tablet Take 0.25 mg by mouth every 8 (eight) hours as needed for anxiety.   Yes [provider]  magnesium hydroxide (MILK OF MAGNESIA) 400 MG/5ML suspension Take 30 mLs by mouth daily as needed for mild constipation.   Yes [provider]  melatonin 3 MG TABS tablet Take 3 mg by mouth at bedtime as needed.   Yes [provider]  metFORMIN (GLUCOPHAGE-XR) 500 MG 24 hr tablet Take 500 mg by mouth 2 (two) times a day.   Yes [provider]  metoprolol tartrate (LOPRESSOR) 25 MG tablet Take 1 tablet (25 mg total) by mouth 2 (two) times daily. 08/23/18  Yes Wieting, Richard, MD  Multiple Vitamin (MULTIVITAMIN WITH MINERALS) TABS tablet Take 1 tablet by mouth daily. 08/23/18  Yes Wieting, Richard, MD  olopatadine (PATANOL) 0.1 % ophthalmic solution Place 1 drop into both eyes daily.   Yes [provider]  ondansetron (ZOFRAN) 4 MG tablet Take 4 mg by mouth every 8 (eight) hours as needed for nausea or vomiting.   Yes [provider]  Pseudoephedrine-DM-GG (ROBITUSSIN CF PO) Take 10 mLs by mouth every 6 (six) hours as needed (cough).   Yes [provider]  QUEtiapine (SEROQUEL) 25 MG tablet Take 1  tablet (25 mg total) by mouth at bedtime as needed (psychosis, insomnia). Patient taking differently: Take 25 mg by mouth 2 (two) times daily. 08/23/18  Yes Wieting, Richard, MD  sertraline (ZOLOFT) 50 MG tablet Take 50 mg by mouth daily. 10/23/20  Yes [provider]  sodium phosphate Pediatric (FLEET) 3.5-9.5 GM/59ML enema Place 1 enema rectally once as needed for severe constipation.   Yes [provider]  traMADol (ULTRAM) 50 MG tablet Take 50 mg by mouth 2 (two) times daily.   Yes [provider]  alum & Jodelle Green  hydroxide-simeth (MAALOX/MYLANTA) 200-200-20 MG/5ML suspension Take 30 mLs by mouth as needed for indigestion or heartburn.    [provider]  nystatin (MYCOSTATIN/NYSTOP) powder Apply 1 g topically 2 (two) times daily as needed (to affected area(s)).    [provider]  psyllium (METAMUCIL) 58.6 % powder Take 1 packet by mouth 2 (two) times a day. Patient not taking: No sig reported    [provider]   Recent Labs    04/29/21 2221 04/30/21 0727  WBC 8.4 8.7  HGB 9.1* 8.8*  HCT 28.9* 29.1*  PLT 178 178  K 4.7 4.8  CL 108 108  CO2 26 26  BUN 37* 40*  CREATININE 0.91 1.07*  GLUCOSE 163* 143*  CALCIUM 8.7* 8.7*  INR  --  1.2   DG Chest 1 View  Result Date: 04/29/2021 CLINICAL DATA:  Recent fall with known left hip fracture, initial encounter EXAM: CHEST  1 VIEW COMPARISON:  10/28/2020 FINDINGS: Cardiac shadow is enlarged but stable. Aortic calcifications are again seen. Postsurgical changes are noted and stable. No focal infiltrate or sizable effusion is seen. Mild chronic interstitial changes are noted. IMPRESSION: Chronic interstitial changes without acute abnormality. Electronically Signed   By: Alcide Clever M.D.   On: 04/29/2021 22:48   CT Head Wo Contrast  Result Date: 04/29/2021 CLINICAL DATA:  87 year old female with head trauma. EXAM: CT HEAD WITHOUT CONTRAST CT CERVICAL SPINE WITHOUT CONTRAST TECHNIQUE:  Multidetector CT imaging of the head and cervical spine was performed following the standard protocol without intravenous contrast. Multiplanar CT image reconstructions of the cervical spine were also generated. COMPARISON:  Head CT dated 10/08/2020. FINDINGS: Evaluation of this exam is limited due to motion artifact. CT HEAD FINDINGS Brain: Moderate age-related atrophy and chronic microvascular ischemic changes. There is no acute intracranial hemorrhage. No mass effect or midline shift. No extra-axial fluid collection. Vascular: No hyperdense vessel or unexpected calcification. Skull: Normal. Negative for fracture or focal lesion. Sinuses/Orbits: No acute finding. Other: Mild right forehead contusion. CT CERVICAL SPINE FINDINGS Alignment: No acute subluxation. Skull base and vertebrae: No acute fracture. Osteopenia. Chronic appearing fracture of C6 with anterior wedging. Soft tissues and spinal canal: No prevertebral fluid or swelling. No visible canal hematoma. Disc levels:  Multilevel degenerative changes and facet arthropathy. Upper chest: Negative. Other: Large left thyroid nodule measuring up to 6 cm. In the setting of significant comorbidities or limited life expectancy, no follow-up recommended (ref: J Am Coll Radiol. 2015 Feb;12(2): 143-50). IMPRESSION: 1. No acute intracranial pathology. Moderate age-related atrophy and chronic microvascular ischemic changes. 2. No acute/traumatic cervical spine pathology. Multilevel degenerative changes. Electronically Signed   By: Elgie Collard M.D.   On: 04/29/2021 23:27   CT Cervical Spine Wo Contrast  Result Date: 04/29/2021 CLINICAL DATA:  87 year old female with head trauma. EXAM: CT HEAD WITHOUT CONTRAST CT CERVICAL SPINE WITHOUT CONTRAST TECHNIQUE: Multidetector CT imaging of the head and cervical spine was performed following the standard protocol without intravenous contrast. Multiplanar CT image reconstructions of the cervical spine were also generated.  COMPARISON:  Head CT dated 10/08/2020. FINDINGS: Evaluation of this exam is limited due to motion artifact. CT HEAD FINDINGS Brain: Moderate age-related atrophy and chronic microvascular ischemic changes. There is no acute intracranial hemorrhage. No mass effect or midline shift. No extra-axial fluid collection. Vascular: No hyperdense vessel or unexpected calcification. Skull: Normal. Negative for fracture or focal lesion. Sinuses/Orbits: No acute finding. Other: Mild right forehead contusion. CT CERVICAL SPINE FINDINGS Alignment: No acute subluxation.  Skull base and vertebrae: No acute fracture. Osteopenia. Chronic appearing fracture of C6 with anterior wedging. Soft tissues and spinal canal: No prevertebral fluid or swelling. No visible canal hematoma. Disc levels:  Multilevel degenerative changes and facet arthropathy. Upper chest: Negative. Other: Large left thyroid nodule measuring up to 6 cm. In the setting of significant comorbidities or limited life expectancy, no follow-up recommended (ref: J Am Coll Radiol. 2015 Feb;12(2): 143-50). IMPRESSION: 1. No acute intracranial pathology. Moderate age-related atrophy and chronic microvascular ischemic changes. 2. No acute/traumatic cervical spine pathology. Multilevel degenerative changes. Electronically Signed   By: Elgie CollardArash  Radparvar M.D.   On: 04/29/2021 23:27   DG Hip Unilat W or Wo Pelvis 2-3 Views Left  Result Date: 04/29/2021 CLINICAL DATA:  87 year old female with fall and left hip pain. EXAM: DG HIP (WITH OR WITHOUT PELVIS) 2-3V LEFT COMPARISON:  None. FINDINGS: There is a mildly displaced and angulated fracture of the left femoral neck predominantly involving the intertrochanteric and subtrochanteric region. There is varus angulation. No dislocation. The bones are osteopenic. The soft tissues are unremarkable. IMPRESSION: Mildly displaced and angulated fracture of the left femoral neck. No dislocation. Electronically Signed   By: Elgie CollardArash  Radparvar M.D.    On: 04/29/2021 22:53     Positive ROS: All other systems have been reviewed and were otherwise negative with the exception of those mentioned in the HPI and as above.  Physical Exam: BP (!) 144/46   Pulse 77   Temp 99.8 F (37.7 C) (Temporal)   Resp 19   Wt 85.3 kg   SpO2 91%   BMI 31.28 kg/m  General: A&O x1 only Psychiatric:  Patient is not competent for consent, nonagitated Cardiovascular:  No pedal edema, regular rate and rhythm Respiratory:  No wheezing, non-labored breathing GI:  Abdomen is soft and non-tender Skin:  No lesions in the area of chief complaint, no erythema Neurologic:  Sensation intact distally, CN grossly intact Lymphatic:  No axillary or cervical lymphadenopathy  Orthopedic Exam:  LLE: + DF/PF/EHL SILT grossly over foot Foot wwp +Log roll/axial load   X-rays:  As above: L intertrochanteric hip fracture  Assessment/Plan: [name] is a 87 y.o. female with a L intertrochanteric hip fracture  1. I discussed the various treatment options including both surgical and non-surgical management of the fracture with the patient and/or family (medical PoA). We discussed the high risk of perioperative complications due to patient's age, dementia, and other co-morbidities. After discussion of risks, benefits, and alternatives to surgery, the family and/or patient were in agreement to proceed with surgery. The goals of surgery would be to provide adequate pain relief and allow for mobilization. Plan for surgery is L hip cephalomedullary nailing today, 04/30/2021. 2. NPO until OR 3. Hold anticoagulation in advance of OR      Signa KellSunny Dax Murguia   04/30/2021 12:49 PM

## 2021-04-30 NOTE — H&P (Signed)
H&P reviewed. No significant changes noted.  

## 2021-04-30 NOTE — Op Note (Signed)
DATE OF SURGERY: 04/30/2021  PREOPERATIVE DIAGNOSIS: Left intertrochanteric hip fracture  POSTOPERATIVE DIAGNOSIS: Left intertrochanteric hip fracture  PROCEDURE: Intramedullary nailing of Left femur with cephalomedullary device  SURGEON: Rosealee Albee, MD  ANESTHESIA: spinal  EBL: 100 cc  IVF: per anesthesia record  COMPONENTS:  [employer] Intertan Long Nail: 10x381mm; 40mm lag screw with 58mm compression screw; 5x 29mm distal cortical interlocking screw  INDICATIONS: [name] is a 87 y.o. female who sustained an intertrochanteric fracture after a fall. Risks and benefits of intramedullary nailing were explained to the patient and/or family . Risks include but are not limited to bleeding, infection, injury to tissues, nerves, vessels, nonunion/malunion, hardware failure, limb length discrepancy/hip rotation mismatch and risks of anesthesia. The patient and/or family understand these risks, have completed an informed consent, and wish to proceed.   PROCEDURE:  The patient was brought into the operating room. After administering anesthesia, the patient was placed in the supine position on the Hana table. The uninjured leg was placed in an extended position while the injured lower extremity was placed in longitudinal traction. The fracture was reduced using longitudinal traction and internal rotation. The adequacy of reduction was verified fluoroscopically in AP and lateral projections and found to be acceptable. The lateral aspect of the hip and thigh were prepped with ChloraPrep solution before being draped sterilely. Preoperative IV antibiotics were administered. A timeout was performed to verify the appropriate surgical site, patient, and procedure.    The greater trochanter was identified and an approximately 6 cm incision was made about 3 fingerbreadths above the tip of the greater trochanter. The incision was carried down through the subcutaneous tissues to expose the  gluteal fascia. This was split the length of the incision, providing access to the tip of the trochanter. Under fluoroscopic guidance, a guidewire was drilled through the tip of the trochanter into the proximal metaphysis to the level of the lesser trochanter. After verifying its position fluoroscopically in AP and lateral projections, it was overreamed with the opening reamer to the level of the lesser trochanter. A guidewire was passed down through the femoral canal to the supracondylar region. The guidewire was overreamed sequentially using the flexible reamers. The nail was selected and advanced to the appropriate depth as verified fluoroscopically.    The guide system for the lag screw was positioned and advanced through an approximately 3cm incision over the lateral aspect of the proximal femur. The guidewire was drilled up through the femoral nail and into the femoral neck to rest within 5 mm of subchondral bone. After verifying its position in the femoral neck and head in both AP and lateral projections, the guidewire was measured and appropriate sized lag screw was selected.  The channel for the compression screw was drilled and antirotation bar was placed.  Lag screw was drilled and placed in appropriate position.  Compression screw was then placed.  Appropriate compression was achieved.  The set screw was locked in place. Again, the adequacy of hardware position and fracture reduction was verified fluoroscopically in AP and lateral projections.   Attention was directed distally. Using the "perfect circle" technique, the leg and fluoroscopy machine were positioned appropriately. A 2cm stab incision was made over the skin and IT band at the appropriate point before the drill bit was advanced through the cortex and across the static hole of the nail. Appropriate screw length was determined with a measuring guide. A distal interlocking screw was placed. Again, the adequacy of screw  position was verified  fluoroscopically in AP and lateral projections.   The wounds were irrigated thoroughly with sterile saline solution. Local anesthetic was injected into the wounds. Deep fascia was closed with 0-Vicryl. The subcutaneous tissues were closed using 2-0 Vicryl interrupted sutures. The skin was closed using staples. Sterile occlusive dressings were applied to all wounds. The patient was then transferred to the recovery room in satisfactory condition.   POSTOPERATIVE PLAN: The patient will be WBAT on the operative extremity. Lovenox 40mg /day x 4 weeks to start on POD#1. Perioperative IV antibiotics x 24 hours. PT/OT on POD#1.

## 2021-04-30 NOTE — Progress Notes (Signed)
Son called and updated

## 2021-04-30 NOTE — Progress Notes (Addendum)
PROGRESS NOTE    Earnestine Shipp  SHF:[id number] DOB: [birth year] DOA: 04/29/2021 PCP: Lauro Regulus, MD  Outpatient Specialists: cardiology    Brief Narrative:   Presented from SNF after unwitnessed fall, found to have left femoral neck fracture.   Assessment & Plan:   Principal Problem:   Closed left hip fracture (HCC) Active Problems:   Anxiety   Diabetes (HCC)   HTN (hypertension)   Chronic diastolic (congestive) heart failure (HCC)   Acute on chronic anemia   Fall   Chronic respiratory failure with hypoxia (HCC)   # Acute left femoral neck fracture Neurovascularly intact. Plan for operative repair today. - cont home tramadol bid - morphine for severe pain - start miralax - dvt ppx after surgery, ortho advises 4 wks  # Fall CT head and c-spine w/o acute abnormality.  # Acute blood loss anemia 2/2 hip fracture. H 8.1 today from baseline 11s - trend  # Chronic hypoxic respiratory failure # COPD Appears stable - [place] O2 - home meds  # Chronic diastolic heart failure Appears euvolemic - resume home lasix after surgery  # T2DM Here glucose wnl - SSI, hold home metformin  # HTN Here bp low normal - resume home metoprolol, hold home amlodipine  # Dementia # Anxiety - cont home depakote and seroquel prn - hold home lorazepam - cont home sertraline   DVT prophylaxis: SCDs Code Status: DNR Family Communication: none @ bedside  Level of care: Med-Surg Status is: Inpatient  Remains inpatient appropriate because:Inpatient level of care appropriate due to severity of illness  Dispo: The patient is from: SNF              Anticipated d/c is to: SNF              Patient currently is not medically stable to d/c.   Difficult to place patient No        Consultants:  orthopedics  Procedures: none  Antimicrobials:  none    Subjective: This morning in bed says is hungry, some pain in left hip  Objective: Vitals:   04/30/21 0020  04/30/21 0127 04/30/21 0357 04/30/21 0751  BP: (!) 126/57 (!) 172/57 (!) 131/47 (!) 130/45  Pulse: 79 80 75 80  Resp: 18 20 19 20   Temp:  99 F (37.2 C) 98.5 F (36.9 C) 98.9 F (37.2 C)  TempSrc:    Oral  SpO2: 95% 94% 97% 97%  Weight:        Intake/Output Summary (Last 24 hours) at 04/30/2021 0834 Last data filed at 04/30/2021 0600 Gross per 24 hour  Intake --  Output 350 ml  Net -350 ml   Filed Weights   04/29/21 2216  Weight: 85.3 kg    Examination:  General exam: Appears calm and comfortable  Respiratory system: Clear to auscultation. Respiratory effort normal. Cardiovascular system: S1 & S2 heard, RRR. Soft systolic murmur. No edema Gastrointestinal system: Abdomen is obese, soft and nontender. No organomegaly or masses felt. Normal bowel sounds heard. Central nervous system: Alert and oriented. No focal neurological deficits. Extremities: warm. Left leg external rotation Skin: No rashes, lesions or ulcers Psychiatry: unable to assess    Data Reviewed: I have personally reviewed following labs and imaging studies  CBC: Recent Labs  Lab 04/29/21 2221 04/30/21 0727  WBC 8.4 8.7  HGB 9.1* 8.8*  HCT 28.9* 29.1*  MCV 83.5 85.3  PLT 178 178   Basic Metabolic Panel: Recent Labs  Lab 04/29/21 2221  04/30/21 0727  NA 140 141  K 4.7 4.8  CL 108 108  CO2 26 26  GLUCOSE 163* 143*  BUN 37* 40*  CREATININE 0.91 1.07*  CALCIUM 8.7* 8.7*  MG 1.8 2.0   GFR: Estimated Creatinine Clearance: 40.7 mL/min (A) (by C-G formula based on SCr of 1.07 mg/dL (H)). Liver Function Tests: Recent Labs  Lab 04/29/21 2221 04/30/21 0727  AST 20 17  ALT 22 20  ALKPHOS 83 76  BILITOT 0.9 0.7  PROT 6.6 6.6  ALBUMIN 3.1* 3.1*   No results for input(s): LIPASE, AMYLASE in the last 168 hours. No results for input(s): AMMONIA in the last 168 hours. Coagulation Profile: Recent Labs  Lab 04/30/21 0727  INR 1.2   Cardiac Enzymes: No results for input(s): CKTOTAL, CKMB,  CKMBINDEX, TROPONINI in the last 168 hours. BNP (last 3 results) No results for input(s): PROBNP in the last 8760 hours. HbA1C: No results for input(s): HGBA1C in the last 72 hours. CBG: Recent Labs  Lab 04/29/21 2208 04/30/21 0607  GLUCAP 161* 142*   Lipid Profile: No results for input(s): CHOL, HDL, LDLCALC, TRIG, CHOLHDL, LDLDIRECT in the last 72 hours. Thyroid Function Tests: No results for input(s): TSH, T4TOTAL, FREET4, T3FREE, THYROIDAB in the last 72 hours. Anemia Panel: Recent Labs    04/29/21 2221  FOLATE 16.0  FERRITIN 114  TIBC 298  IRON 26*  RETICCTPCT 2.3   Urine analysis:    Component Value Date/Time   COLORURINE YELLOW (A) 10/28/2020 1632   APPEARANCEUR HAZY (A) 10/28/2020 1632   APPEARANCEUR Clear 05/10/2017 0932   LABSPEC 1.015 10/28/2020 1632   LABSPEC 1.038 06/17/2014 1357   PHURINE 5.0 10/28/2020 1632   GLUCOSEU NEGATIVE 10/28/2020 1632   GLUCOSEU Negative 06/17/2014 1357   HGBUR NEGATIVE 10/28/2020 1632   BILIRUBINUR NEGATIVE 10/28/2020 1632   BILIRUBINUR Negative 05/10/2017 0932   BILIRUBINUR Negative 06/17/2014 1357   KETONESUR NEGATIVE 10/28/2020 1632   PROTEINUR 100 (A) 10/28/2020 1632   NITRITE NEGATIVE 10/28/2020 1632   LEUKOCYTESUR SMALL (A) 10/28/2020 1632   LEUKOCYTESUR Negative 06/17/2014 1357   Sepsis Labs: @LABRCNTIP (procalcitonin:4,lacticidven:4)  ) Recent Results (from the past 240 hour(s))  Resp Panel by RT-PCR (Flu A&B, Covid) Nasopharyngeal Swab     Status: None   Collection Time: 04/29/21 11:59 PM   Specimen: Nasopharyngeal Swab; Nasopharyngeal(NP) swabs in vial transport medium  Result Value Ref Range Status   SARS Coronavirus 2 by RT PCR NEGATIVE NEGATIVE Final    Comment: (NOTE) SARS-CoV-2 target nucleic acids are NOT DETECTED.  The SARS-CoV-2 RNA is generally detectable in upper respiratory specimens during the acute phase of infection. The lowest concentration of SARS-CoV-2 viral copies this assay can detect  is 138 copies/mL. A negative result does not preclude SARS-Cov-2 infection and should not be used as the sole basis for treatment or other patient management decisions. A negative result may occur with  improper specimen collection/handling, submission of specimen other than nasopharyngeal swab, presence of viral mutation(s) within the areas targeted by this assay, and inadequate number of viral copies(<138 copies/mL). A negative result must be combined with clinical observations, patient history, and epidemiological information. The expected result is Negative.  Fact Sheet for Patients:  05/01/21  Fact Sheet for Healthcare Providers:  BloggerCourse.com  This test is no t yet approved or cleared by the [place] FDA and  has been authorized for detection and/or diagnosis of SARS-CoV-2 by FDA under an Emergency Use Authorization (EUA). This EUA will remain  in  effect (meaning this test can be used) for the duration of the COVID-19 declaration under Section 564(b)(1) of the Act, 21 U.S.C.section 360bbb-3(b)(1), unless the authorization is terminated  or revoked sooner.       Influenza A by PCR NEGATIVE NEGATIVE Final   Influenza B by PCR NEGATIVE NEGATIVE Final    Comment: (NOTE) The Xpert Xpress SARS-CoV-2/FLU/RSV plus assay is intended as an aid in the diagnosis of influenza from Nasopharyngeal swab specimens and should not be used as a sole basis for treatment. Nasal washings and aspirates are unacceptable for Xpert Xpress SARS-CoV-2/FLU/RSV testing.  Fact Sheet for Patients: BloggerCourse.comhttps://www.fda.gov/media/152166/download  Fact Sheet for Healthcare Providers: SeriousBroker.ithttps://www.fda.gov/media/152162/download  This test is not yet approved or cleared by the [place] FDA and has been authorized for detection and/or diagnosis of SARS-CoV-2 by FDA under an Emergency Use Authorization (EUA). This EUA will remain in effect  (meaning this test can be used) for the duration of the COVID-19 declaration under Section 564(b)(1) of the Act, 21 U.S.C. section 360bbb-3(b)(1), unless the authorization is terminated or revoked.  Performed at [facility], [address] [place], [place] [address]          Radiology Studies: DG Chest 1 View  Result Date: 04/29/2021 CLINICAL DATA:  Recent fall with known left hip fracture, initial encounter EXAM: CHEST  1 VIEW COMPARISON:  10/28/2020 FINDINGS: Cardiac shadow is enlarged but stable. Aortic calcifications are again seen. Postsurgical changes are noted and stable. No focal infiltrate or sizable effusion is seen. Mild chronic interstitial changes are noted. IMPRESSION: Chronic interstitial changes without acute abnormality. Electronically Signed   By: Alcide CleverMark  Lukens M.D.   On: 04/29/2021 22:48   CT Head Wo Contrast  Result Date: 04/29/2021 CLINICAL DATA:  87 year old female with head trauma. EXAM: CT HEAD WITHOUT CONTRAST CT CERVICAL SPINE WITHOUT CONTRAST TECHNIQUE: Multidetector CT imaging of the head and cervical spine was performed following the standard protocol without intravenous contrast. Multiplanar CT image reconstructions of the cervical spine were also generated. COMPARISON:  Head CT dated 10/08/2020. FINDINGS: Evaluation of this exam is limited due to motion artifact. CT HEAD FINDINGS Brain: Moderate age-related atrophy and chronic microvascular ischemic changes. There is no acute intracranial hemorrhage. No mass effect or midline shift. No extra-axial fluid collection. Vascular: No hyperdense vessel or unexpected calcification. Skull: Normal. Negative for fracture or focal lesion. Sinuses/Orbits: No acute finding. Other: Mild right forehead contusion. CT CERVICAL SPINE FINDINGS Alignment: No acute subluxation. Skull base and vertebrae: No acute fracture. Osteopenia. Chronic appearing fracture of C6 with anterior wedging. Soft tissues and spinal canal: No  prevertebral fluid or swelling. No visible canal hematoma. Disc levels:  Multilevel degenerative changes and facet arthropathy. Upper chest: Negative. Other: Large left thyroid nodule measuring up to 6 cm. In the setting of significant comorbidities or limited life expectancy, no follow-up recommended (ref: J Am Coll Radiol. 2015 Feb;12(2): 143-50). IMPRESSION: 1. No acute intracranial pathology. Moderate age-related atrophy and chronic microvascular ischemic changes. 2. No acute/traumatic cervical spine pathology. Multilevel degenerative changes. Electronically Signed   By: Elgie CollardArash  Radparvar M.D.   On: 04/29/2021 23:27   CT Cervical Spine Wo Contrast  Result Date: 04/29/2021 CLINICAL DATA:  87 year old female with head trauma. EXAM: CT HEAD WITHOUT CONTRAST CT CERVICAL SPINE WITHOUT CONTRAST TECHNIQUE: Multidetector CT imaging of the head and cervical spine was performed following the standard protocol without intravenous contrast. Multiplanar CT image reconstructions of the cervical spine were also generated. COMPARISON:  Head CT dated 10/08/2020. FINDINGS:  Evaluation of this exam is limited due to motion artifact. CT HEAD FINDINGS Brain: Moderate age-related atrophy and chronic microvascular ischemic changes. There is no acute intracranial hemorrhage. No mass effect or midline shift. No extra-axial fluid collection. Vascular: No hyperdense vessel or unexpected calcification. Skull: Normal. Negative for fracture or focal lesion. Sinuses/Orbits: No acute finding. Other: Mild right forehead contusion. CT CERVICAL SPINE FINDINGS Alignment: No acute subluxation. Skull base and vertebrae: No acute fracture. Osteopenia. Chronic appearing fracture of C6 with anterior wedging. Soft tissues and spinal canal: No prevertebral fluid or swelling. No visible canal hematoma. Disc levels:  Multilevel degenerative changes and facet arthropathy. Upper chest: Negative. Other: Large left thyroid nodule measuring up to 6 cm. In the  setting of significant comorbidities or limited life expectancy, no follow-up recommended (ref: J Am Coll Radiol. 2015 Feb;12(2): 143-50). IMPRESSION: 1. No acute intracranial pathology. Moderate age-related atrophy and chronic microvascular ischemic changes. 2. No acute/traumatic cervical spine pathology. Multilevel degenerative changes. Electronically Signed   By: Elgie Collard M.D.   On: 04/29/2021 23:27   DG Hip Unilat W or Wo Pelvis 2-3 Views Left  Result Date: 04/29/2021 CLINICAL DATA:  87 year old female with fall and left hip pain. EXAM: DG HIP (WITH OR WITHOUT PELVIS) 2-3V LEFT COMPARISON:  None. FINDINGS: There is a mildly displaced and angulated fracture of the left femoral neck predominantly involving the intertrochanteric and subtrochanteric region. There is varus angulation. No dislocation. The bones are osteopenic. The soft tissues are unremarkable. IMPRESSION: Mildly displaced and angulated fracture of the left femoral neck. No dislocation. Electronically Signed   By: Elgie Collard M.D.   On: 04/29/2021 22:53        Scheduled Meds:  insulin aspart  0-6 Units Subcutaneous Q6H   ipratropium-albuterol  3 mL Nebulization BID   metoprolol tartrate  25 mg Oral BID   Continuous Infusions:  [START ON 05/01/2021]  ceFAZolin (ANCEF) IV       LOS: 1 day    Time spent: 35 min    Silvano Bilis, MD [facility]   If 7PM-7AM, please contact night-coverage [email or web address] Password [facility] 04/30/2021, 8:34 AM

## 2021-05-01 ENCOUNTER — Encounter: Payer: Self-pay | Admitting: Orthopedic Surgery

## 2021-05-01 LAB — BASIC METABOLIC PANEL
Anion gap: 5 (ref 5–15)
BUN: 47 mg/dL — ABNORMAL HIGH (ref 8–23)
CO2: 25 mmol/L (ref 22–32)
Calcium: 8.3 mg/dL — ABNORMAL LOW (ref 8.9–10.3)
Chloride: 112 mmol/L — ABNORMAL HIGH (ref 98–111)
Creatinine, Ser: 1.26 mg/dL — ABNORMAL HIGH (ref 0.44–1.00)
GFR, Estimated: 42 mL/min — ABNORMAL LOW (ref >60)
Glucose, Bld: 141 mg/dL — ABNORMAL HIGH (ref 70–99)
Potassium: 4.8 mmol/L (ref 3.5–5.1)
Sodium: 142 mmol/L (ref 135–145)

## 2021-05-01 LAB — CBC
HCT: 25.1 % — ABNORMAL LOW (ref 36.0–46.0)
Hemoglobin: 7.6 g/dL — ABNORMAL LOW (ref 12.0–15.0)
MCH: 26.5 pg (ref 26.0–34.0)
MCHC: 30.3 g/dL (ref 30.0–36.0)
MCV: 87.5 fL (ref 80.0–100.0)
Platelets: 160 10*3/uL (ref 150–400)
RBC: 2.87 MIL/uL — ABNORMAL LOW (ref 3.87–5.11)
RDW: 16.9 % — ABNORMAL HIGH (ref 11.5–15.5)
WBC: 9.1 10*3/uL (ref 4.0–10.5)
nRBC: 0 % (ref 0.0–0.2)

## 2021-05-01 LAB — GLUCOSE, CAPILLARY
Glucose-Capillary: 120 mg/dL — ABNORMAL HIGH (ref 70–99)
Glucose-Capillary: 130 mg/dL — ABNORMAL HIGH (ref 70–99)
Glucose-Capillary: 141 mg/dL — ABNORMAL HIGH (ref 70–99)
Glucose-Capillary: 166 mg/dL — ABNORMAL HIGH (ref 70–99)

## 2021-05-01 MED ORDER — FERROUS SULFATE 325 (65 FE) MG PO TABS
325.0000 mg | ORAL_TABLET | ORAL | Status: DC
  Administered 2021-05-01 – 2021-05-03 (×2): 325 mg via ORAL
  Filled 2021-05-01 (×3): qty 1

## 2021-05-01 MED ORDER — OXYCODONE HCL 5 MG PO TABS: 2.5000 mg | ORAL_TABLET | Freq: Four times a day (QID) | ORAL | 0 refills | Status: DC | PRN

## 2021-05-01 MED ORDER — OXYCODONE HCL 5 MG PO TABS: 5.0000 mg | ORAL_TABLET | ORAL | 0 refills | Status: DC | PRN

## 2021-05-01 MED ORDER — TRAMADOL HCL 50 MG PO TABS: 50.0000 mg | ORAL_TABLET | Freq: Four times a day (QID) | ORAL | 0 refills | Status: AC | PRN

## 2021-05-01 MED ORDER — SODIUM CHLORIDE 0.9 % IV SOLN: INTRAVENOUS | Status: AC

## 2021-05-01 MED ORDER — ENOXAPARIN SODIUM 40 MG/0.4ML IJ SOSY: 40.0000 mg | PREFILLED_SYRINGE | INTRAMUSCULAR | 0 refills | Status: DC

## 2021-05-01 NOTE — Evaluation (Signed)
Physical Therapy Evaluation Patient Details Name: [name] MRN: [id number] DOB: [birth year] Today's Date: 05/01/2021   History of Present Illness  [name] is a 87 y.o. female who sustained an intertrochanteric fracture after a fall. Underwent intramedullary nailing of Left femur with cephalomedullary device on 04/30/2021.    Clinical Impression  Pt received in Semi-Fowler's position and agreeable to therapy.  Pt son also in room and assisting therapist during mobility/transfers.  Pt notes 10/10 pain with any form of mobility within the bed.  Pt was able to assist with bed-level exercises, requiring some assistance from therapist to perform full bouts.  Pt was able to come to standing with use of FWW and +2 modA from therapist and son.  Pt unable to resume staning >30 sec and requested to sit back down.  Pt then transferred and slid back to head of bed and all needs were met.  Pt does have some cognitive issues throughout session and according to son has a lot tolerance for pain.  Pt and son agreeable to recommended STR stint.  Pt will benefit from skilled PT intervention to increase independence and safety with basic mobility in preparation for discharge to the venue listed below.       Follow Up Recommendations SNF;Supervision/Assistance - 24 hour    Equipment Recommendations  Other (comment) (Determined at Next Venue of Care.)    Recommendations for Other Services       Precautions / Restrictions Precautions Precautions: Fall Restrictions Weight Bearing Restrictions: Yes LLE Weight Bearing: Weight bearing as tolerated      Mobility  Bed Mobility Overal bed mobility: Needs Assistance Bed Mobility: Supine to Sit     Supine to sit: Max assist     General bed mobility comments: ModA to come upright with heavy use of handrails and therapist support    Transfers Overall transfer level: Needs assistance   Transfers: Sit to/from Stand Sit to Stand: Mod assist;Max  assist;+2 physical assistance;+2 safety/equipment         General transfer comment: mod-maxA +2 for physical assistance and safety during transfer.  Ambulation/Gait             General Gait Details: deferred due to inability to stand upright >30 seconds without assistance.  Stairs            Wheelchair Mobility    Modified Rankin (Stroke Patients Only)       Balance Overall balance assessment: History of Falls;Needs assistance Sitting-balance support: Feet supported;Bilateral upper extremity supported Sitting balance-Leahy Scale: Poor   Postural control: Right lateral lean Standing balance support: Bilateral upper extremity supported Standing balance-Leahy Scale: Poor Standing balance comment: Pt requires modA +2 for support in standing.                             Pertinent Vitals/Pain Pain Assessment: 0-10 Pain Score: 10-Worst pain ever Pain Location: R Hip Pain Intervention(s): Limited activity within patient's tolerance;Monitored during session;Premedicated before session;Repositioned    Home Living Family/patient expects to be discharged to:: Skilled nursing facility                      Prior Function Level of Independence: Needs assistance   Gait / Transfers Assistance Needed: Pt unable to ambulate, uses WC for mobility  ADL's / Homemaking Assistance Needed: Pt dependent on SNF for ADL/IADL tasks.        Hand Dominance   Dominant  Hand: Right    Extremity/Trunk Assessment                Communication   Communication: No difficulties  Cognition Arousal/Alertness: Awake/alert Behavior During Therapy: WFL for tasks assessed/performed Overall Cognitive Status: History of cognitive impairments - at baseline                                 General Comments: Pt is currently at memory care at [facility]      General Comments      Exercises Total Joint Exercises Ankle Circles/Pumps:  AROM;Strengthening;Both;10 reps;Supine Quad Sets: AROM;Strengthening;Both;10 reps;Supine Gluteal Sets: AROM;Strengthening;Both;10 reps;Supine Hip ABduction/ADduction: AAROM;Strengthening;Both;10 reps;Supine Straight Leg Raises: AAROM;Strengthening;Both;10 reps;Supine Other Exercises Other Exercises: Pt educated on role of Pt and services provided during hospital stay.  Pt and son also educated on the importance of performing exercises throughout stay in order to increase muscle strength and prevent atrophy.   Assessment/Plan    PT Assessment Patient needs continued PT services  PT Problem List Decreased strength;Decreased range of motion;Decreased activity tolerance;Decreased balance;Decreased mobility;Decreased cognition;Decreased knowledge of use of DME;Decreased safety awareness;Obesity;Pain       PT Treatment Interventions DME instruction;Gait training;Therapeutic activities;Therapeutic exercise;Balance training    PT Goals (Current goals can be found in the Care Plan section)  Acute Rehab PT Goals Patient Stated Goal: to decrease pain PT Goal Formulation: With patient/family Time For Goal Achievement: 05/15/21 Potential to Achieve Goals: Fair    Frequency 7X/week   Barriers to discharge Inaccessible home environment;Decreased caregiver support Pt currently at memory care facility, however they do not have a skilled nursing staff in order to assist with transfers.    Co-evaluation               AM-PAC PT "6 Clicks" Mobility  Outcome Measure Help needed turning from your back to your side while in a flat bed without using bedrails?: A Lot Help needed moving from lying on your back to sitting on the side of a flat bed without using bedrails?: A Lot Help needed moving to and from a bed to a chair (including a wheelchair)?: A Lot Help needed standing up from a chair using your arms (e.g., wheelchair or bedside chair)?: A Lot Help needed to walk in hospital room?:  Total Help needed climbing 3-5 steps with a railing? : Total 6 Click Score: 10    End of Session Equipment Utilized During Treatment: Gait belt Activity Tolerance: Patient limited by pain Patient left: in bed;with call bell/phone within reach;with bed alarm set;with family/visitor present;with SCD's reapplied Nurse Communication: Mobility status PT Visit Diagnosis: Unsteadiness on feet (R26.81);Other abnormalities of gait and mobility (R26.89);Muscle weakness (generalized) (M62.81);History of falling (Z91.81);Difficulty in walking, not elsewhere classified (R26.2);Pain Pain - Right/Left: Left Pain - part of body: Hip    Time: 7062-3762 PT Time Calculation (min) (ACUTE ONLY): 46 min   Charges:   PT Evaluation $PT Eval Low Complexity: 1 Low PT Treatments $Gait Training: 8-22 mins $Therapeutic Exercise: 23-37 mins        Gwenlyn Saran, PT, DPT 05/01/21, 2:16 PM   Christie Nottingham 05/01/2021, 2:16 PM

## 2021-05-01 NOTE — [place] FL2 (Signed)
[place] MEDICAID FL2 LEVEL OF CARE SCREENING TOOL     IDENTIFICATION  Patient Name: [name] Birthdate: [birth year] Sex: female Admission Date (Current Location): 04/29/2021  [facility] and [place] Number:  Chiropodist and Address:  [facility], [address] [place], [place] [address]      Provider Number: [id number]  Attending Physician Name and Address:  Kathrynn Running, MD  Relative Name and Phone Number:  [name] SOn, [place], [phone]    Current Level of Care: Hospital Recommended Level of Care: Skilled Nursing Facility Prior Approval Number:    Date Approved/Denied:   PASRR Number: [id number]  Discharge Plan: SNF    Current Diagnoses: Patient Active Problem List   Diagnosis Date Noted   Acute on chronic anemia 04/30/2021   Fall 04/30/2021   Chronic respiratory failure with hypoxia (HCC) 04/30/2021   Closed left hip fracture (HCC) 04/29/2021   Acute decompensated heart failure (HCC) 10/29/2020   Acute CHF (congestive heart failure) (HCC) 10/28/2020   Pneumonia due to COVID-19 virus 10/16/2019   Elevated brain natriuretic peptide (BNP) level 10/16/2019   Lactic acidosis 10/16/2019   Acute on chronic respiratory failure with hypoxia (HCC) 10/15/2019   Suspected COVID-19 virus infection 10/15/2019   HTN (hypertension) 10/15/2019   Chronic diastolic (congestive) heart failure (HCC) 10/15/2019   Hyperkalemia 10/15/2019   CKD (chronic kidney disease), stage IIIa 10/15/2019   Hypoxia 05/23/2019   Acute delirium 08/16/2018   Dementia with behavioral disturbance (HCC) 08/16/2018   Encephalopathy acute 08/11/2018   Sepsis (HCC) 05/21/2017   Aspiration pneumonia (HCC) 05/21/2017   GERD (gastroesophageal reflux disease) 05/21/2017   HLD (hyperlipidemia) 05/21/2017   Depression 05/21/2017   Anxiety 05/21/2017   Elevated troponin 05/21/2017   Diabetes (HCC) 05/21/2017   Right humeral fracture 05/21/2017   Nausea and  vomiting 03/31/2015   Microcytic anemia 03/31/2015    Orientation RESPIRATION BLADDER Height & Weight     Self, Place  Normal External catheter Weight: 85.3 kg Height:     BEHAVIORAL SYMPTOMS/MOOD NEUROLOGICAL BOWEL NUTRITION STATUS      Continent Diet (regular)  AMBULATORY STATUS COMMUNICATION OF NEEDS Skin   Extensive Assist Verbally Surgical wounds                       Personal Care Assistance Level of Assistance  Bathing, Dressing Bathing Assistance: Limited assistance   Dressing Assistance: Limited assistance     Functional Limitations Info             SPECIAL CARE FACTORS FREQUENCY  PT (By licensed PT)     PT Frequency: 5 times per week              Contractures Contractures Info: Not present    Additional Factors Info  Code Status, Allergies Code Status Info: DNR Allergies Info: Ampicillin, Sulfa Antibiotics           Current Medications (05/01/2021):  This is the current hospital active medication list Current Facility-Administered Medications  Medication Dose Route Frequency Provider Last Rate Last Admin   0.9 %  sodium chloride infusion   Intravenous Continuous Wouk, Wilfred Curtis, MD 75 mL/hr at 05/01/21 1351 New Bag at 05/01/21 1351   acetaminophen (TYLENOL) tablet 1,000 mg  1,000 mg Oral Q8H Signa Kell, MD   1,000 mg at 05/01/21 1351   bisacodyl (DULCOLAX) suppository 10 mg  10 mg Rectal Daily PRN Signa Kell, MD       Chlorhexidine Gluconate  Cloth 2 % PADS 6 each  6 each Topical Daily Wouk, Wilfred Curtis, MD   6 each at 05/01/21 0830   divalproex (DEPAKOTE ER) 24 hr tablet 250 mg  250 mg Oral QHS Signa Kell, MD   250 mg at 04/30/21 2128   docusate sodium (COLACE) capsule 100 mg  100 mg Oral BID Signa Kell, MD   100 mg at 05/01/21 0823   enoxaparin (LOVENOX) injection 40 mg  40 mg Subcutaneous Q24H Signa Kell, MD   40 mg at 05/01/21 0831   ferrous sulfate tablet 325 mg  325 mg Oral Q48H Wouk, Wilfred Curtis, MD       HYDROmorphone  (DILAUDID) injection 0.2-0.4 mg  0.2-0.4 mg Intravenous Q4H PRN Signa Kell, MD       insulin aspart (novoLOG) injection 0-6 Units  0-6 Units Subcutaneous Q6H Signa Kell, MD   1 Units at 05/01/21 0644   ipratropium-albuterol (DUONEB) 0.5-2.5 (3) MG/3ML nebulizer solution 3 mL  3 mL Nebulization BID Signa Kell, MD   3 mL at 05/01/21 0815   LORazepam (ATIVAN) injection 0.5 mg  0.5 mg Intravenous Q6H PRN Signa Kell, MD   0.5 mg at 05/01/21 0824   methocarbamol (ROBAXIN) tablet 500 mg  500 mg Oral Q6H PRN Signa Kell, MD   500 mg at 05/01/21 1235   Or   methocarbamol (ROBAXIN) 500 mg in dextrose 5 % 50 mL IVPB  500 mg Intravenous Q6H PRN Signa Kell, MD       metoCLOPramide (REGLAN) tablet 5-10 mg  5-10 mg Oral Q8H PRN Signa Kell, MD       Or   metoCLOPramide (REGLAN) injection 5-10 mg  5-10 mg Intravenous Q8H PRN Signa Kell, MD       metoprolol tartrate (LOPRESSOR) tablet 25 mg  25 mg Oral BID Signa Kell, MD   25 mg at 05/01/21 3419   naloxone [facility]) injection 0.4 mg  0.4 mg Intravenous PRN Signa Kell, MD       ondansetron [facility]) tablet 4 mg  4 mg Oral Q6H PRN Signa Kell, MD       Or   ondansetron [facility]) injection 4 mg  4 mg Intravenous Q6H PRN Signa Kell, MD       oxyCODONE (Oxy IR/ROXICODONE) immediate release tablet 2.5-5 mg  2.5-5 mg Oral Q4H PRN Signa Kell, MD       oxyCODONE (Oxy IR/ROXICODONE) immediate release tablet 5-10 mg  5-10 mg Oral Q4H PRN Signa Kell, MD   10 mg at 05/01/21 1354   senna-docusate (Senokot-S) tablet 1 tablet  1 tablet Oral QHS PRN Signa Kell, MD       sertraline (ZOLOFT) tablet 50 mg  50 mg Oral Daily Signa Kell, MD   50 mg at 05/01/21 6222   sodium phosphate (FLEET) 7-19 GM/118ML enema 1 enema  1 enema Rectal Once PRN Signa Kell, MD       traMADol Janean Sark) tablet 50 mg  50 mg Oral Q6H PRN Signa Kell, MD         Discharge Medications: Please see discharge summary for a list of discharge medications.  Relevant Imaging  Results:  Relevant Lab Results:   Additional Information SSN; [id number]  Barrie Dunker, RN

## 2021-05-01 NOTE — Discharge Instructions (Signed)
INSTRUCTIONS AFTER Surgery  Remove items at home which could result in a fall. This includes throw rugs or furniture in walking pathways ICE to the affected joint every three hours while awake for 30 minutes at a time, for at least the first 3-5 days, and then as needed for pain and swelling.  Continue to use ice for pain and swelling. You may notice swelling that will progress down to the foot and ankle.  This is normal after surgery.  Elevate your leg when you are not up walking on it.   Continue to use the breathing machine you got in the hospital (incentive spirometer) which will help keep your temperature down.  It is common for your temperature to cycle up and down following surgery, especially at night when you are not up moving around and exerting yourself.  The breathing machine keeps your lungs expanded and your temperature down.   DIET:  As you were doing prior to hospitalization, we recommend a well-balanced diet.  DRESSING / WOUND CARE / SHOWERING  Dressing change as needed.  No showering.  Staples will be removed in 2 weeks at [facility] clinic orthopedics.  We will also get x-rays of the left femur.  ACTIVITY  Increase activity slowly as tolerated, but follow the weight bearing instructions below.   No driving for 6 weeks or until further direction given by your physician.  You cannot drive while taking narcotics.  No lifting or carrying greater than 10 lbs. until further directed by your surgeon. Avoid periods of inactivity such as sitting longer than an hour when not asleep. This helps prevent blood clots.  You may return to work once you are authorized by your doctor.     WEIGHT BEARING  Weightbearing as tolerated on the left   EXERCISES Gait training and ambulation training with a walker.  CONSTIPATION  Constipation is defined medically as fewer than three stools per week and severe constipation as less than one stool per week.  Even if you have a regular bowel  pattern at home, your normal regimen is likely to be disrupted due to multiple reasons following surgery.  Combination of anesthesia, postoperative narcotics, change in appetite and fluid intake all can affect your bowels.   YOU MUST use at least one of the following options; they are listed in order of increasing strength to get the job done.  They are all available over the counter, and you may need to use some, POSSIBLY even all of these options:    Drink plenty of fluids (prune juice may be helpful) and high fiber foods Colace 100 mg by mouth twice a day  Senokot for constipation as directed and as needed Dulcolax (bisacodyl), take with full glass of water  Miralax (polyethylene glycol) once or twice a day as needed.  If you have tried all these things and are unable to have a bowel movement in the first 3-4 days after surgery call either your surgeon or your primary doctor.    If you experience loose stools or diarrhea, hold the medications until you stool forms back up.  If your symptoms do not get better within 1 week or if they get worse, check with your doctor.  If you experience "the worst abdominal pain ever" or develop nausea or vomiting, please contact the office immediately for further recommendations for treatment.   ITCHING:  If you experience itching with your medications, try taking only a single pain pill, or even half a pain pill at  a time.  You can also use Benadryl over the counter for itching or also to help with sleep.   TED HOSE STOCKINGS:  Use stockings on both legs until for at least 2 weeks or as directed by physician office. They may be removed at night for sleeping.  MEDICATIONS:  See your medication summary on the "After Visit Summary" that nursing will review with you.  You may have some home medications which will be placed on hold until you complete the course of blood thinner medication.  It is important for you to complete the blood thinner medication as  prescribed.  PRECAUTIONS:  If you experience chest pain or shortness of breath - call 911 immediately for transfer to the hospital emergency department.   If you develop a fever greater that 101 F, purulent drainage from wound, increased redness or drainage from wound, foul odor from the wound/dressing, or calf pain - CONTACT YOUR SURGEON.                                                   FOLLOW-UP APPOINTMENTS:  If you do not already have a post-op appointment, please call the office for an appointment to be seen by your surgeon.  Guidelines for how soon to be seen are listed in your "After Visit Summary", but are typically between 1-4 weeks after surgery.  OTHER INSTRUCTIONS:     MAKE SURE YOU:  Understand these instructions.  Get help right away if you are not doing well or get worse.    Thank you for letting us be a part of your medical care team.  It is a privilege we respect greatly.  We hope these instructions will help you stay on track for a fast and full recovery!

## 2021-05-01 NOTE — Progress Notes (Signed)
  Subjective: 1 Day Post-Op Procedure(s) (LRB): INTRAMEDULLARY (IM) NAIL INTERTROCHANTRIC (Left) Patient reports pain as moderate.   Patient is well, and has had no acute complaints or problems Plan is to go Rehab after hospital stay. Negative for chest pain and shortness of breath Fever: no Gastrointestinal: Negative for nausea and vomiting  Objective: Vital signs in last 24 hours: Temp:  [97.4 F (36.3 C)-99.8 F (37.7 C)] 98 F (36.7 C) (06/24 0419) Pulse Rate:  [74-92] 74 (06/24 0419) Resp:  [14-20] 20 (06/24 0419) BP: (114-170)/(39-86) 122/39 (06/24 0419) SpO2:  [91 %-98 %] 94 % (06/24 0419)  Intake/Output from previous day:  Intake/Output Summary (Last 24 hours) at 05/01/2021 2409 Last data filed at 05/01/2021 0600 Gross per 24 hour  Intake 2025 ml  Output 500 ml  Net 1525 ml    Intake/Output this shift: Total I/O In: 1075 [I.V.:875; IV Piggyback:200] Out: 400 [Urine:400]  Labs: Recent Labs    04/29/21 2221 04/30/21 0727 05/01/21 0439  HGB 9.1* 8.8* 7.6*   Recent Labs    04/30/21 0727 05/01/21 0439  WBC 8.7 9.1  RBC 3.41* 2.87*  HCT 29.1* 25.1*  PLT 178 160   Recent Labs    04/30/21 0727 05/01/21 0439  NA 141 142  K 4.8 4.8  CL 108 112*  CO2 26 25  BUN 40* 47*  CREATININE 1.07* 1.26*  GLUCOSE 143* 141*  CALCIUM 8.7* 8.3*   Recent Labs    04/30/21 0727  INR 1.2     EXAM General - Patient is Alert and Confused Extremity - Neurovascular intact Sensation intact distally Dorsiflexion/Plantar flexion intact Compartment soft Dressing/Incision - clean, dry, no drainage Motor Function - intact, moving foot and toes well on exam.   Past Medical History:  Diagnosis Date   Anxiety    CHF (congestive heart failure) (HCC)    COPD (chronic obstructive pulmonary disease) (HCC)    Dementia (HCC)    Depression    Diabetes mellitus without complication (HCC)    GERD (gastroesophageal reflux disease)    Hip fracture (HCC)    Hyperlipemia     Hypertension    Neuropathy    Vertigo     Assessment/Plan: 1 Day Post-Op Procedure(s) (LRB): INTRAMEDULLARY (IM) NAIL INTERTROCHANTRIC (Left) Principal Problem:   Closed left hip fracture (HCC) Active Problems:   Anxiety   Diabetes (HCC)   HTN (hypertension)   Chronic diastolic (congestive) heart failure (HCC)   Acute on chronic anemia   Fall   Chronic respiratory failure with hypoxia (HCC)  Estimated body mass index is 31.28 kg/m as calculated from the following:   Height as of 12/05/20: 5\' 5"  (1.651 m).   Weight as of this encounter: 85.3 kg. Advance diet Up with therapy  Acute blood loss anemia postoperative with hemoglobin 7.6.  Hospitalist will determine transfusion.  Follow-up with [facility] clinic orthopedics in 2 weeks for staple removal and x-rays of the left femur  DVT Prophylaxis - Lovenox, Foot Pumps, and TED hose Weight-Bearing as tolerated to left leg  [facility], PA-C Orthopaedic Surgery 05/01/2021, 6:42 AM

## 2021-05-01 NOTE — Anesthesia Postprocedure Evaluation (Signed)
Anesthesia Post Note  Patient: Sydny Schnitzler  Procedure(s) Performed: INTRAMEDULLARY (IM) NAIL INTERTROCHANTRIC (Left)  Patient location during evaluation: Nursing Unit Anesthesia Type: Spinal Level of consciousness: oriented and awake and alert Pain management: pain level controlled Vital Signs Assessment: post-procedure vital signs reviewed and stable Respiratory status: spontaneous breathing and respiratory function stable Cardiovascular status: blood pressure returned to baseline and stable Postop Assessment: no headache, no backache, no apparent nausea or vomiting and patient able to bend at knees Anesthetic complications: no   No notable events documented.   Last Vitals:  Vitals:   04/30/21 2019 05/01/21 0419  BP: (!) 114/48 (!) 122/39  Pulse: 84 74  Resp: 18 20  Temp: 36.8 C 36.7 C  SpO2: 93% 94%    Last Pain:  Vitals:   05/01/21 0419  TempSrc: Oral  PainSc:                  Starling Manns

## 2021-05-01 NOTE — Progress Notes (Signed)
PROGRESS NOTE    [name]  SNK:[id number] DOB: 1932-05-19 DOA: 04/29/2021 PCP: Lauro Regulus, MD  Outpatient Specialists: cardiology    Brief Narrative:   Presented from SNF after unwitnessed fall, found to have left femoral neck fracture.   Assessment & Plan:   Principal Problem:   Closed left hip fracture (HCC) Active Problems:   Anxiety   Diabetes (HCC)   HTN (hypertension)   Chronic diastolic (congestive) heart failure (HCC)   Acute on chronic anemia   Fall   Chronic respiratory failure with hypoxia (HCC)   # Acute left femoral neck fracture Neurovascularly intact. Operative repair 6/23 - cont home tramadol bid - morphine for severe pain - cont  miralax - lovenox for 4 wks 4 wks - ortho f/u 2 weeks - Weight bearing as tolerated  # Fall CT head and c-spine w/o acute abnormality. Has bruise right forehead  # Acute blood loss anemia 2/2 hip fracture. 7.6 today from baseline 11s - trend - start oral iron  # Acute kidney injury Cr 1.26 from baseline of around 1, likely a bit dehydrated after surgery - will keep fluids going @ 75  # Chronic hypoxic respiratory failure # COPD Appears stable - [place] O2 - home meds  # Chronic diastolic heart failure Appears euvolemic - hold lasix while receiving fluids  # T2DM Here glucose wnl - SSI, hold home metformin  # HTN Here bp low normal - resume home metoprolol, hold home amlodipine  # Dementia # Anxiety - cont home depakote and seroquel prn - hold home lorazepam - cont home sertraline   DVT prophylaxis: lovenox Code Status: DNR Family Communication: none @ bedside; attempted to call son no answer  Level of care: Med-Surg Status is: Inpatient  Remains inpatient appropriate because:Inpatient level of care appropriate due to severity of illness  Dispo: The patient is from: SNF              Anticipated d/c is to: SNF, SW aware              Patient currently is not medically stable to  d/c.   Difficult to place patient No        Consultants:  orthopedics  Procedures: none  Antimicrobials:  none    Subjective: Pain controlled, tolerating diet, plans to work with pt, no chest pian or fevers, denies lightheadedness  Objective: Vitals:   04/30/21 1615 04/30/21 1657 04/30/21 2019 05/01/21 0419  BP: (!) 160/67 (!) 149/58 (!) 114/48 (!) 122/39  Pulse: 83 92 84 74  Resp: 14 16 18 20   Temp:  (!) 97.4 F (36.3 C) 98.2 F (36.8 C) 98 F (36.7 C)  TempSrc:  Oral Oral Oral  SpO2: 95% 92% 93% 94%  Weight:        Intake/Output Summary (Last 24 hours) at 05/01/2021 1340 Last data filed at 05/01/2021 0650 Gross per 24 hour  Intake 1975 ml  Output 1200 ml  Net 775 ml   Filed Weights   04/29/21 2216  Weight: 85.3 kg    Examination:  General exam: Appears calm and comfortable  Respiratory system: Clear to auscultation. Respiratory effort normal. Cardiovascular system: S1 & S2 heard, RRR. Soft systolic murmur. No edema Gastrointestinal system: Abdomen is obese, soft and nontender. No organomegaly or masses felt. Normal bowel sounds heard. Central nervous system: Alert and oriented. No focal neurological deficits. Extremities: warm. Left leg external rotation Skin: echymosis right forehead Psychiatry: alert    Data Reviewed: I  have personally reviewed following labs and imaging studies  CBC: Recent Labs  Lab 04/29/21 2221 04/30/21 0727 05/01/21 0439  WBC 8.4 8.7 9.1  HGB 9.1* 8.8* 7.6*  HCT 28.9* 29.1* 25.1*  MCV 83.5 85.3 87.5  PLT 178 178 160   Basic Metabolic Panel: Recent Labs  Lab 04/29/21 2221 04/30/21 0727 05/01/21 0439  NA 140 141 142  K 4.7 4.8 4.8  CL 108 108 112*  CO2 26 26 25   GLUCOSE 163* 143* 141*  BUN 37* 40* 47*  CREATININE 0.91 1.07* 1.26*  CALCIUM 8.7* 8.7* 8.3*  MG 1.8 2.0  --    GFR: Estimated Creatinine Clearance: 34.6 mL/min (A) (by C-G formula based on SCr of 1.26 mg/dL (H)). Liver Function Tests: Recent  Labs  Lab 04/29/21 2221 04/30/21 0727  AST 20 17  ALT 22 20  ALKPHOS 83 76  BILITOT 0.9 0.7  PROT 6.6 6.6  ALBUMIN 3.1* 3.1*   No results for input(s): LIPASE, AMYLASE in the last 168 hours. No results for input(s): AMMONIA in the last 168 hours. Coagulation Profile: Recent Labs  Lab 04/30/21 0727  INR 1.2   Cardiac Enzymes: No results for input(s): CKTOTAL, CKMB, CKMBINDEX, TROPONINI in the last 168 hours. BNP (last 3 results) No results for input(s): PROBNP in the last 8760 hours. HbA1C: No results for input(s): HGBA1C in the last 72 hours. CBG: Recent Labs  Lab 04/30/21 1532 04/30/21 1827 05/01/21 0024 05/01/21 0616 05/01/21 1147  GLUCAP 135* 136* 141* 166* 120*   Lipid Profile: No results for input(s): CHOL, HDL, LDLCALC, TRIG, CHOLHDL, LDLDIRECT in the last 72 hours. Thyroid Function Tests: No results for input(s): TSH, T4TOTAL, FREET4, T3FREE, THYROIDAB in the last 72 hours. Anemia Panel: Recent Labs    04/29/21 2221  FOLATE 16.0  FERRITIN 114  TIBC 298  IRON 26*  RETICCTPCT 2.3   Urine analysis:    Component Value Date/Time   COLORURINE YELLOW (A) 10/28/2020 1632   APPEARANCEUR HAZY (A) 10/28/2020 1632   APPEARANCEUR Clear 05/10/2017 0932   LABSPEC 1.015 10/28/2020 1632   LABSPEC 1.038 06/17/2014 1357   PHURINE 5.0 10/28/2020 1632   GLUCOSEU NEGATIVE 10/28/2020 1632   GLUCOSEU Negative 06/17/2014 1357   HGBUR NEGATIVE 10/28/2020 1632   BILIRUBINUR NEGATIVE 10/28/2020 1632   BILIRUBINUR Negative 05/10/2017 0932   BILIRUBINUR Negative 06/17/2014 1357   KETONESUR NEGATIVE 10/28/2020 1632   PROTEINUR 100 (A) 10/28/2020 1632   NITRITE NEGATIVE 10/28/2020 1632   LEUKOCYTESUR SMALL (A) 10/28/2020 1632   LEUKOCYTESUR Negative 06/17/2014 1357   Sepsis Labs: @LABRCNTIP (procalcitonin:4,lacticidven:4)  ) Recent Results (from the past 240 hour(s))  Resp Panel by RT-PCR (Flu A&B, Covid) Nasopharyngeal Swab     Status: None   Collection Time:  04/29/21 11:59 PM   Specimen: Nasopharyngeal Swab; Nasopharyngeal(NP) swabs in vial transport medium  Result Value Ref Range Status   SARS Coronavirus 2 by RT PCR NEGATIVE NEGATIVE Final    Comment: (NOTE) SARS-CoV-2 target nucleic acids are NOT DETECTED.  The SARS-CoV-2 RNA is generally detectable in upper respiratory specimens during the acute phase of infection. The lowest concentration of SARS-CoV-2 viral copies this assay can detect is 138 copies/mL. A negative result does not preclude SARS-Cov-2 infection and should not be used as the sole basis for treatment or other patient management decisions. A negative result may occur with  improper specimen collection/handling, submission of specimen other than nasopharyngeal swab, presence of viral mutation(s) within the areas targeted by this assay, and inadequate number  of viral copies(<138 copies/mL). A negative result must be combined with clinical observations, patient history, and epidemiological information. The expected result is Negative.  Fact Sheet for Patients:  BloggerCourse.com  Fact Sheet for Healthcare Providers:  SeriousBroker.it  This test is no t yet approved or cleared by the [place] FDA and  has been authorized for detection and/or diagnosis of SARS-CoV-2 by FDA under an Emergency Use Authorization (EUA). This EUA will remain  in effect (meaning this test can be used) for the duration of the COVID-19 declaration under Section 564(b)(1) of the Act, 21 U.S.C.section 360bbb-3(b)(1), unless the authorization is terminated  or revoked sooner.       Influenza A by PCR NEGATIVE NEGATIVE Final   Influenza B by PCR NEGATIVE NEGATIVE Final    Comment: (NOTE) The Xpert Xpress SARS-CoV-2/FLU/RSV plus assay is intended as an aid in the diagnosis of influenza from Nasopharyngeal swab specimens and should not be used as a sole basis for treatment. Nasal washings  and aspirates are unacceptable for Xpert Xpress SARS-CoV-2/FLU/RSV testing.  Fact Sheet for Patients: BloggerCourse.com  Fact Sheet for Healthcare Providers: SeriousBroker.it  This test is not yet approved or cleared by the [place] FDA and has been authorized for detection and/or diagnosis of SARS-CoV-2 by FDA under an Emergency Use Authorization (EUA). This EUA will remain in effect (meaning this test can be used) for the duration of the COVID-19 declaration under Section 564(b)(1) of the Act, 21 U.S.C. section 360bbb-3(b)(1), unless the authorization is terminated or revoked.  Performed at [facility], [address], [place], [place] [address]          Radiology Studies: DG Chest 1 View  Result Date: 04/29/2021 CLINICAL DATA:  Recent fall with known left hip fracture, initial encounter EXAM: CHEST  1 VIEW COMPARISON:  10/28/2020 FINDINGS: Cardiac shadow is enlarged but stable. Aortic calcifications are again seen. Postsurgical changes are noted and stable. No focal infiltrate or sizable effusion is seen. Mild chronic interstitial changes are noted. IMPRESSION: Chronic interstitial changes without acute abnormality. Electronically Signed   By: Alcide Clever M.D.   On: 04/29/2021 22:48   CT Head Wo Contrast  Result Date: 04/29/2021 CLINICAL DATA:  87 year old female with head trauma. EXAM: CT HEAD WITHOUT CONTRAST CT CERVICAL SPINE WITHOUT CONTRAST TECHNIQUE: Multidetector CT imaging of the head and cervical spine was performed following the standard protocol without intravenous contrast. Multiplanar CT image reconstructions of the cervical spine were also generated. COMPARISON:  Head CT dated 10/08/2020. FINDINGS: Evaluation of this exam is limited due to motion artifact. CT HEAD FINDINGS Brain: Moderate age-related atrophy and chronic microvascular ischemic changes. There is no acute intracranial hemorrhage. No mass  effect or midline shift. No extra-axial fluid collection. Vascular: No hyperdense vessel or unexpected calcification. Skull: Normal. Negative for fracture or focal lesion. Sinuses/Orbits: No acute finding. Other: Mild right forehead contusion. CT CERVICAL SPINE FINDINGS Alignment: No acute subluxation. Skull base and vertebrae: No acute fracture. Osteopenia. Chronic appearing fracture of C6 with anterior wedging. Soft tissues and spinal canal: No prevertebral fluid or swelling. No visible canal hematoma. Disc levels:  Multilevel degenerative changes and facet arthropathy. Upper chest: Negative. Other: Large left thyroid nodule measuring up to 6 cm. In the setting of significant comorbidities or limited life expectancy, no follow-up recommended (ref: J Am Coll Radiol. 2015 Feb;12(2): 143-50). IMPRESSION: 1. No acute intracranial pathology. Moderate age-related atrophy and chronic microvascular ischemic changes. 2. No acute/traumatic cervical spine pathology. Multilevel degenerative changes. Electronically Signed  By: Elgie CollardArash  Radparvar M.D.   On: 04/29/2021 23:27   CT Cervical Spine Wo Contrast  Result Date: 04/29/2021 CLINICAL DATA:  87 year old female with head trauma. EXAM: CT HEAD WITHOUT CONTRAST CT CERVICAL SPINE WITHOUT CONTRAST TECHNIQUE: Multidetector CT imaging of the head and cervical spine was performed following the standard protocol without intravenous contrast. Multiplanar CT image reconstructions of the cervical spine were also generated. COMPARISON:  Head CT dated 10/08/2020. FINDINGS: Evaluation of this exam is limited due to motion artifact. CT HEAD FINDINGS Brain: Moderate age-related atrophy and chronic microvascular ischemic changes. There is no acute intracranial hemorrhage. No mass effect or midline shift. No extra-axial fluid collection. Vascular: No hyperdense vessel or unexpected calcification. Skull: Normal. Negative for fracture or focal lesion. Sinuses/Orbits: No acute finding. Other:  Mild right forehead contusion. CT CERVICAL SPINE FINDINGS Alignment: No acute subluxation. Skull base and vertebrae: No acute fracture. Osteopenia. Chronic appearing fracture of C6 with anterior wedging. Soft tissues and spinal canal: No prevertebral fluid or swelling. No visible canal hematoma. Disc levels:  Multilevel degenerative changes and facet arthropathy. Upper chest: Negative. Other: Large left thyroid nodule measuring up to 6 cm. In the setting of significant comorbidities or limited life expectancy, no follow-up recommended (ref: J Am Coll Radiol. 2015 Feb;12(2): 143-50). IMPRESSION: 1. No acute intracranial pathology. Moderate age-related atrophy and chronic microvascular ischemic changes. 2. No acute/traumatic cervical spine pathology. Multilevel degenerative changes. Electronically Signed   By: Elgie CollardArash  Radparvar M.D.   On: 04/29/2021 23:27   DG HIP OPERATIVE UNILAT W OR W/O PELVIS LEFT  Result Date: 04/30/2021 CLINICAL DATA:  Left hip IM nail. EXAM: OPERATIVE LEFT HIP (WITH PELVIS IF PERFORMED) TECHNIQUE: Fluoroscopic spot image(s) were submitted for interpretation post-operatively. COMPARISON:  Preoperative imaging. FINDINGS: Eleven fluoroscopic spot views of the left hip and femur obtained in the operating room. Intramedullary nail with trans trochanteric and distal locking screw traverse proximal femur fracture. Fluoroscopy time reported as 2.9. IMPRESSION: Procedural fluoroscopy for ORIF left proximal femur fracture. Electronically Signed   By: Narda RutherfordMelanie  Sanford M.D.   On: 04/30/2021 15:38   DG Hip Unilat W or Wo Pelvis 2-3 Views Left  Result Date: 04/29/2021 CLINICAL DATA:  87 year old female with fall and left hip pain. EXAM: DG HIP (WITH OR WITHOUT PELVIS) 2-3V LEFT COMPARISON:  None. FINDINGS: There is a mildly displaced and angulated fracture of the left femoral neck predominantly involving the intertrochanteric and subtrochanteric region. There is varus angulation. No dislocation. The  bones are osteopenic. The soft tissues are unremarkable. IMPRESSION: Mildly displaced and angulated fracture of the left femoral neck. No dislocation. Electronically Signed   By: Elgie CollardArash  Radparvar M.D.   On: 04/29/2021 22:53        Scheduled Meds:  acetaminophen  1,000 mg Oral Q8H   Chlorhexidine Gluconate Cloth  6 each Topical Daily   divalproex  250 mg Oral QHS   docusate sodium  100 mg Oral BID   enoxaparin (LOVENOX) injection  40 mg Subcutaneous Q24H   insulin aspart  0-6 Units Subcutaneous Q6H   ipratropium-albuterol  3 mL Nebulization BID   metoprolol tartrate  25 mg Oral BID   sertraline  50 mg Oral Daily   Continuous Infusions:  sodium chloride 75 mL/hr at 04/30/21 1831   methocarbamol (ROBAXIN) IV       LOS: 2 days    Time spent: 25 min    Silvano BilisNoah B Carmesha Morocco, MD [facility]   If 7PM-7AM, please contact night-coverage [email or web address] Password [email or web address] 05/01/2021,  1:40 PM

## 2021-05-01 NOTE — Evaluation (Signed)
Occupational Therapy Evaluation Patient Details Name: [name] MRN: [id number] DOB: 08/06/1932 Today's Date: 05/01/2021    History of Present Illness [name] is a 87 y.o. female who sustained an intertrochanteric fracture after a fall. Underwent intramedullary nailing of Left femur with cephalomedullary device on 04/30/2021.   Clinical Impression   [name] presents today pleasantly demented, oriented to self only. Lying immobile in bed, she reports no pain and states she feels "happy." With movement in bed, however, she screams out in pain. Very limited STM, unable to recall from one minute to the next that she had a fall & hip fx, and hence keeps asking why she has pain and becomes quite agitated. Per chart review, pt presents to [facility] from a memory care unit; pt herself is unable to provide any information about her background or PLOF. [name] will need to DC to SNF for hip rehab. Per conversation with PT, [name] current memory care home is not able to provide skilled nursing care.    Follow Up Recommendations  SNF    Equipment Recommendations       Recommendations for Other Services       Precautions / Restrictions Precautions Precautions: Fall Restrictions Weight Bearing Restrictions: Yes LLE Weight Bearing: Weight bearing as tolerated      Mobility Bed Mobility Overal bed mobility: Needs Assistance Bed Mobility: Rolling Rolling: Total assist;+2 for physical assistance   Supine to sit: Total assist;+2 for physical assistance     General bed mobility comments: ModA to come upright with heavy use of handrails and therapist support    Transfers Overall transfer level: Needs assistance   Transfers: Sit to/from Stand Sit to Stand: Mod assist;Max assist;+2 physical assistance;+2 safety/equipment         General transfer comment: not attempted, 2/2 pain, weakness    Balance Overall balance assessment: History of Falls;Needs  assistance Sitting-balance support: Feet supported;Bilateral upper extremity supported Sitting balance-Leahy Scale: Poor   Postural control: Right lateral lean Standing balance support: Bilateral upper extremity supported Standing balance-Leahy Scale: Zero Standing balance comment: Pt requires modA +2 for support in standing.                           ADL either performed or assessed with clinical judgement   ADL Overall ADL's : Needs assistance/impaired                 Upper Body Dressing : Maximal assistance           Toileting- Clothing Manipulation and Hygiene: Total assistance;Bed level;+2 for physical assistance       Functional mobility during ADLs: Maximal assistance       Vision Patient Visual Report: No change from baseline       Perception     Praxis      Pertinent Vitals/Pain Pain Assessment:  (pt unable to quantify, but screams out with any movement) Pain Score: 10-Worst pain ever Pain Location: R Hip Pain Intervention(s): Limited activity within patient's tolerance;Monitored during session;Premedicated before session;Repositioned;Utilized relaxation techniques     Hand Dominance Right   Extremity/Trunk Assessment Upper Extremity Assessment Upper Extremity Assessment: Generalized weakness   Lower Extremity Assessment Lower Extremity Assessment: Generalized weakness;LLE deficits/detail LLE Deficits / Details: s/p closed L hip fx LLE: Unable to fully assess due to pain       Communication Communication Communication: No difficulties   Cognition Arousal/Alertness: Awake/alert Behavior During Therapy: WFL for tasks  assessed/performed Overall Cognitive Status: History of cognitive impairments - at baseline                                 General Comments: Oriented to self only   General Comments       Exercises Total Joint Exercises Ankle Circles/Pumps: AROM;Strengthening;Both;10 reps;Supine Quad Sets:  AROM;Strengthening;Both;10 reps;Supine Gluteal Sets: AROM;Strengthening;Both;10 reps;Supine Hip ABduction/ADduction: AAROM;Strengthening;Both;10 reps;Supine Straight Leg Raises: AAROM;Strengthening;Both;10 reps;Supine Other Exercises Other Exercises: Pt educated on role of Pt and services provided during hospital stay.  Pt and son also educated on the importance of performing exercises throughout stay in order to increase muscle strength and prevent atrophy. Other Exercises: Bed mobility, toileting, hygeine, dressing, pain mgmt, relaxation techniques   Shoulder Instructions      Home Living Family/patient expects to be discharged to:: Skilled nursing facility                                        Prior Functioning/Environment Level of Independence: Needs assistance  Gait / Transfers Assistance Needed: Pt unable to ambulate, uses WC for mobility ADL's / Homemaking Assistance Needed: Pt dependent on SNF for ADL/IADL tasks.            OT Problem List: Decreased strength;Decreased range of motion;Decreased coordination;Decreased activity tolerance;Decreased cognition;Impaired balance (sitting and/or standing);Pain      OT Treatment/Interventions: Self-care/ADL training;Patient/family education;Therapeutic exercise;Balance training;Energy conservation;Therapeutic activities;DME and/or AE instruction;Cognitive remediation/compensation    OT Goals(Current goals can be found in the care plan section) Acute Rehab OT Goals Patient Stated Goal: to decrease pain OT Goal Formulation: With patient Time For Goal Achievement: 05/15/21 Potential to Achieve Goals: Good  OT Frequency: Min 1X/week   Barriers to D/C:            Co-evaluation              AM-PAC OT "6 Clicks" Daily Activity     Outcome Measure Help from another person eating meals?: A Little Help from another person taking care of personal grooming?: A Lot Help from another person toileting, which  includes using toliet, bedpan, or urinal?: Total Help from another person bathing (including washing, rinsing, drying)?: A Lot Help from another person to put on and taking off regular upper body clothing?: A Little Help from another person to put on and taking off regular lower body clothing?: A Lot 6 Click Score: 13   End of Session Equipment Utilized During Treatment: Oxygen  Activity Tolerance: Patient limited by pain;Treatment limited secondary to agitation;Patient tolerated treatment well (pt becomes agitated with movement/pain) Patient left: in bed;with call bell/phone within reach;with bed alarm set  OT Visit Diagnosis: Unsteadiness on feet (R26.81);Other abnormalities of gait and mobility (R26.89);History of falling (Z91.81);Pain;Other symptoms and signs involving cognitive function;Muscle weakness (generalized) (M62.81) Pain - Right/Left: Left Pain - part of body: Hip                Time: 7619-5093 OT Time Calculation (min): 36 min Charges:  OT General Charges $OT Visit: 1 Visit OT Evaluation $OT Eval Moderate Complexity: 1 Mod OT Treatments $Self Care/Home Management : 23-37 mins Latina Craver, PhD, MS, OTR/L 05/01/21, 2:45 PM

## 2021-05-01 NOTE — TOC Progression Note (Signed)
Transition of Care [facility]) - Progression Note    Patient Details  Name: Allexa Acoff MRN: [id number] Date of Birth: 1932-07-13  [facility] of [facility] CM/SW Contact  Barrie Dunker, RN Phone Number: 05/01/2021, 3:03 PM  Clinical Narrative:     Inda Coke and FL2 completed, awaiting bed offers, will review once obtained       Expected Discharge Plan and Services                                                 Social Determinants of Health (SDOH) Interventions    Readmission Risk Interventions Readmission Risk Prevention Plan 11/03/2020 05/28/2019 05/27/2019  Transportation Screening Complete Complete Complete  PCP or Specialist Appt within 3-5 Days (No Data) - -  HRI or Home Care Consult Complete - -  Social Work Consult for Recovery Care Planning/Counseling - - Complete  Palliative Care Screening Not Applicable - -  Medication Review Ocean[facility]) Complete Complete Complete  PCP or Specialist appointment within 3-5 days of discharge - (No Data) -  HRI or Home Care Consult - Complete -  Palliative Care Screening - Not Applicable -  Skilled Nursing Facility - Not Applicable -  Some recent data might be hidden

## 2021-05-01 NOTE — Care Management Important Message (Signed)
Important Message  Patient Details  Name: [name] MRN: [id number] Date of Birth: Apr 22, 1932   Medicare Important Message Given:  N/A - LOS <3 / Initial given by admissions  Initial Medicare IM reviewed with Charleston Ropes, son by S. Conard Novak, Patient Access Associate on 04/30/2021 at 10:58am.   Johnell Comings 05/01/2021, 8:59 AM

## 2021-05-02 LAB — CBC
HCT: 23.8 % — ABNORMAL LOW (ref 36.0–46.0)
Hemoglobin: 7.1 g/dL — ABNORMAL LOW (ref 12.0–15.0)
MCH: 26.5 pg (ref 26.0–34.0)
MCHC: 29.8 g/dL — ABNORMAL LOW (ref 30.0–36.0)
MCV: 88.8 fL (ref 80.0–100.0)
Platelets: 164 10*3/uL (ref 150–400)
RBC: 2.68 MIL/uL — ABNORMAL LOW (ref 3.87–5.11)
RDW: 17 % — ABNORMAL HIGH (ref 11.5–15.5)
WBC: 6.8 10*3/uL (ref 4.0–10.5)
nRBC: 0 % (ref 0.0–0.2)

## 2021-05-02 LAB — GLUCOSE, CAPILLARY
Glucose-Capillary: 108 mg/dL — ABNORMAL HIGH (ref 70–99)
Glucose-Capillary: 123 mg/dL — ABNORMAL HIGH (ref 70–99)
Glucose-Capillary: 136 mg/dL — ABNORMAL HIGH (ref 70–99)
Glucose-Capillary: 138 mg/dL — ABNORMAL HIGH (ref 70–99)
Glucose-Capillary: 143 mg/dL — ABNORMAL HIGH (ref 70–99)

## 2021-05-02 LAB — BASIC METABOLIC PANEL
Anion gap: 5 (ref 5–15)
BUN: 53 mg/dL — ABNORMAL HIGH (ref 8–23)
CO2: 26 mmol/L (ref 22–32)
Calcium: 8.3 mg/dL — ABNORMAL LOW (ref 8.9–10.3)
Chloride: 110 mmol/L (ref 98–111)
Creatinine, Ser: 1.22 mg/dL — ABNORMAL HIGH (ref 0.44–1.00)
GFR, Estimated: 43 mL/min — ABNORMAL LOW (ref >60)
Glucose, Bld: 127 mg/dL — ABNORMAL HIGH (ref 70–99)
Potassium: 4.5 mmol/L (ref 3.5–5.1)
Sodium: 141 mmol/L (ref 135–145)

## 2021-05-02 LAB — METHYLMALONIC ACID, SERUM: Methylmalonic Acid, Quantitative: 396 nmol/L — ABNORMAL HIGH (ref 0–378)

## 2021-05-02 LAB — PREPARE RBC (CROSSMATCH)

## 2021-05-02 MED ORDER — TRAMADOL HCL 50 MG PO TABS
50.0000 mg | ORAL_TABLET | Freq: Two times a day (BID) | ORAL | Status: DC
  Administered 2021-05-02 – 2021-05-04 (×5): 50 mg via ORAL
  Filled 2021-05-02 (×5): qty 1

## 2021-05-02 MED ORDER — SODIUM CHLORIDE 0.9% IV SOLUTION: Freq: Once | INTRAVENOUS | Status: AC

## 2021-05-02 NOTE — Progress Notes (Signed)
Physical Therapy Treatment Patient Details Name: [name] MRN: [id number] DOB: 02/11/1932 Today's Date: 05/02/2021    History of Present Illness [name] is a 87 y.o. female who sustained an intertrochanteric fracture after a fall. Underwent intramedullary nailing of Left femur with cephalomedullary device on 04/30/2021.    PT Comments    Pt was supine in bed with HOB elevated ~ 30 degrees yelling out "help." She had small amount of vomit in lap and RN made aware. She was oriented to self and location but does present with severe cognition deficits. Was aware she had broken hip however pointed to opposite LE when asked where it hurts. She was able to roll to her side with max assist however required total assist to achieve EOB sitting and then total assist to return to supine. PT has anxiety/pain throughout all mobility. Tele-sitter in room and RN issued medication at conclusion of PT session. Pt will required extensive PT going forward to return to PLOF. Acute PT will continue to follow and progress as able per POC. Recommend DC to SNF once deemed medically stable.    Follow Up Recommendations  SNF;Supervision/Assistance - 24 hour     Equipment Recommendations  Other (comment) (defer to next level of care)       Precautions / Restrictions Precautions Precautions: Fall Restrictions Weight Bearing Restrictions: Yes LLE Weight Bearing: Weight bearing as tolerated    Mobility  Bed Mobility Overal bed mobility: Needs Assistance Bed Mobility: Rolling;Sit to Supine Rolling: Total assist (of one/ heavy use of drawsheet)   Supine to sit: Total assist Sit to supine: Total assist   General bed mobility comments: Pt required total assistance of one to sit EOB. Tolerated ~ 8 minutes with constant encouragement to fully participate. cognition greatly impacts session progression. did perform very minimal ther ex in supine but continued to yell out    Transfers      General  transfer comment: Pt unwilling/ yelling out in pain throughout sitting EOB     Balance Overall balance assessment: History of Falls;Needs assistance Sitting-balance support: Feet supported;Bilateral upper extremity supported Sitting balance-Leahy Scale: Fair Sitting balance - Comments: Pt sat EOB x ~ 8 minutes with constant encouragement. supervision once positioned with BUE/BLE support       Standing balance comment: unable to assess       Cognition Arousal/Alertness: Awake/alert Behavior During Therapy: WFL for tasks assessed/performed Overall Cognitive Status: History of cognitive impairments - at baseline          General Comments: oriented to self and hospital. After several minutes did state she broke her hip however pointed to opposite hip of fx site. Pt constantly yelling out throughout session. max encouragement for relaxation and breathing. RN issued adivan after sitting up EOB      Exercises Total Joint Exercises Ankle Circles/Pumps: AROM;Strengthening;Both;10 reps;Supine Quad Sets: AROM;5 reps Gluteal Sets: AROM;5 reps Hip ABduction/ADduction: AAROM;5 reps Straight Leg Raises: AAROM;5 reps        Pertinent Vitals/Pain Pain Assessment: 0-10 Pain Score: 10-Worst pain ever Pain Location: L hip Pain Descriptors / Indicators: Moaning;Operative site guarding Pain Intervention(s): Limited activity within patient's tolerance;Monitored during session;Repositioned;RN gave pain meds during session     PT Goals (current goals can now be found in the care plan section) Acute Rehab PT Goals Patient Stated Goal: none stated Progress towards PT goals: Not progressing toward goals - comment (pain/cognition limiting)    Frequency    7X/week      PT  Plan Current plan remains appropriate       AM-PAC PT "6 Clicks" Mobility   Outcome Measure  Help needed turning from your back to your side while in a flat bed without using bedrails?: A Lot Help needed moving  from lying on your back to sitting on the side of a flat bed without using bedrails?: Total Help needed moving to and from a bed to a chair (including a wheelchair)?: Total Help needed standing up from a chair using your arms (e.g., wheelchair or bedside chair)?: Total Help needed to walk in hospital room?: Total Help needed climbing 3-5 steps with a railing? : Total 6 Click Score: 7    End of Session Equipment Utilized During Treatment: Gait belt Activity Tolerance: Patient limited by pain Patient left: in bed;with call bell/phone within reach;with bed alarm set;with family/visitor present;with SCD's reapplied Nurse Communication: Mobility status PT Visit Diagnosis: Unsteadiness on feet (R26.81);Other abnormalities of gait and mobility (R26.89);Muscle weakness (generalized) (M62.81);History of falling (Z91.81);Difficulty in walking, not elsewhere classified (R26.2);Pain Pain - Right/Left: Left Pain - part of body: Hip     Time: 8295-6213 PT Time Calculation (min) (ACUTE ONLY): 16 min  Charges:  $Therapeutic Activity: 8-22 mins                     Jetta Lout PTA 05/02/21, 10:09 AM

## 2021-05-02 NOTE — Progress Notes (Signed)
  Subjective: 2 Days Post-Op Procedure(s) (LRB): INTRAMEDULLARY (IM) NAIL INTERTROCHANTRIC (Left) Patient reports pain as moderate.   Patient is well, and has had no acute complaints or problems Plan is to go Rehab after hospital stay. Negative for chest pain and shortness of breath Fever: no Gastrointestinal: Negative for nausea and vomiting  Objective: Vital signs in last 24 hours: Temp:  [98 F (36.7 C)-98.6 F (37 C)] 98.6 F (37 C) (06/25 0443) Pulse Rate:  [76-91] 76 (06/25 0451) Resp:  [17-18] 18 (06/25 0443) BP: (119-122)/(46-47) 119/47 (06/25 0443) SpO2:  [73 %-98 %] 96 % (06/25 0451)  Intake/Output from previous day:  Intake/Output Summary (Last 24 hours) at 05/02/2021 0659 Last data filed at 05/02/2021 6256 Gross per 24 hour  Intake 1505.65 ml  Output --  Net 1505.65 ml    Intake/Output this shift: Total I/O In: 1105.4 [I.V.:1105.4] Out: -   Labs: Recent Labs    04/29/21 2221 04/30/21 0727 05/01/21 0439 05/02/21 0357  HGB 9.1* 8.8* 7.6* 7.1*   Recent Labs    05/01/21 0439 05/02/21 0357  WBC 9.1 6.8  RBC 2.87* 2.68*  HCT 25.1* 23.8*  PLT 160 164   Recent Labs    05/01/21 0439 05/02/21 0357  NA 142 141  K 4.8 4.5  CL 112* 110  CO2 25 26  BUN 47* 53*  CREATININE 1.26* 1.22*  GLUCOSE 141* 127*  CALCIUM 8.3* 8.3*   Recent Labs    04/30/21 0727  INR 1.2     EXAM General - Patient is Alert and Confused Extremity - Neurovascular intact Sensation intact distally Dorsiflexion/Plantar flexion intact Compartment soft Dressing/Incision - clean, dry, no drainage Motor Function - intact, moving foot and toes well on exam.   Past Medical History:  Diagnosis Date   Anxiety    CHF (congestive heart failure) (HCC)    COPD (chronic obstructive pulmonary disease) (HCC)    Dementia (HCC)    Depression    Diabetes mellitus without complication (HCC)    GERD (gastroesophageal reflux disease)    Hip fracture (HCC)    Hyperlipemia     Hypertension    Neuropathy    Vertigo     Assessment/Plan: 2 Days Post-Op Procedure(s) (LRB): INTRAMEDULLARY (IM) NAIL INTERTROCHANTRIC (Left) Principal Problem:   Closed left hip fracture (HCC) Active Problems:   Anxiety   Diabetes (HCC)   HTN (hypertension)   Chronic diastolic (congestive) heart failure (HCC)   Acute on chronic anemia   Fall   Chronic respiratory failure with hypoxia (HCC)  Estimated body mass index is 31.28 kg/m as calculated from the following:   Height as of 12/05/20: 5\' 5"  (1.651 m).   Weight as of this encounter: 85.3 kg. Advance diet Up with therapy  Acute blood loss anemia postoperative with hemoglobin 7.1.  1 unit packed red blood cell to be transfused.  Follow-up with [facility] clinic orthopedics in 2 weeks for staple removal and x-rays of the left femur  DVT Prophylaxis - Lovenox, Foot Pumps, and TED hose Weight-Bearing as tolerated to left leg  [facility], PA-C Orthopaedic Surgery 05/02/2021, 6:59 AM

## 2021-05-02 NOTE — Progress Notes (Signed)
PROGRESS NOTE    [name]  ZMO:[id number] DOB: [birth year] DOA: 04/29/2021 PCP: Lauro Regulus, MD  Outpatient Specialists: cardiology    Brief Narrative:   Presented from SNF after unwitnessed fall, found to have left femoral neck fracture.   Assessment & Plan:   Principal Problem:   Closed left hip fracture (HCC) Active Problems:   Anxiety   Diabetes (HCC)   HTN (hypertension)   Chronic diastolic (congestive) heart failure (HCC)   Acute on chronic anemia   Fall   Chronic respiratory failure with hypoxia (HCC)   # Acute left femoral neck fracture Neurovascularly intact. Operative repair 6/23 - cont home tramadol bid - morphine for severe pain - cont  miralax - lovenox for 4 wks 4 wks - ortho f/u 2 weeks - Weight bearing as tolerated - for SNF  # Fall CT head and c-spine w/o acute abnormality. Has bruise right forehead  # Acute blood loss anemia 2/2 hip fracture. 7.1 today from baseline 11s - trend - started oral iron - ortho has ordered 1 u prbc currently transfusing  # Acute kidney injury Cr 1.22 from baseline of around 1, likely a bit dehydrated after surgery - will keep fluids going @ 75  # Chronic hypoxic respiratory failure # COPD Appears stable, O2 requirement is at baseline - [place] O2 - home meds  # Chronic diastolic heart failure Appears euvolemic - hold lasix while receiving fluids  # T2DM Here glucose wnl - SSI, hold home metformin  # HTN Here bp low normal - resume home metoprolol, hold home amlodipine  # Dementia # Anxiety # Delirium - tele-sitter - cont home depakote and seroquel prn - hold home lorazepam - cont home sertraline   DVT prophylaxis: lovenox Code Status: DNR Family Communication: son updated telephonically 6/25  Level of care: Med-Surg Status is: Inpatient  Remains inpatient appropriate because:Inpatient level of care appropriate due to severity of illness  Dispo: The patient is from: SNF               Anticipated d/c is to: SNF, SW aware              Patient currently is not medically stable to d/c.   Difficult to place patient No   Consultants:  orthopedics  Procedures: none  Antimicrobials:  none    Subjective: Pain controlled, tolerating diet, plans to work with pt, no chest pian or fevers, denies lightheadedness  Objective: Vitals:   05/02/21 0801 05/02/21 1115 05/02/21 1138 05/02/21 1223  BP: (!) 116/43 (!) 138/44 (!) 138/44 (!) 135/55  Pulse: 86 95 95 85  Resp: 18 18 18 18   Temp: 98 F (36.7 C) 98 F (36.7 C) 98 F (36.7 C) 98 F (36.7 C)  TempSrc: Oral Axillary Axillary Axillary  SpO2: 95% 99% 99% 99%  Weight:        Intake/Output Summary (Last 24 hours) at 05/02/2021 1254 Last data filed at 05/02/2021 1207 Gross per 24 hour  Intake 1885.65 ml  Output --  Net 1885.65 ml   Filed Weights   04/29/21 2216  Weight: 85.3 kg    Examination:  General exam: in some pain Respiratory system: Clear to auscultation. Respiratory effort normal. Cardiovascular system: S1 & S2 heard, RRR. Soft systolic murmur. No edema Gastrointestinal system: Abdomen is obese, soft and nontender. No organomegaly or masses felt. Normal bowel sounds heard. Central nervous system: awake and alert, moving all extremities Extremities: warm.  Skin: echymosis right forehead Psychiatry: says  left leg hurts, has been calling out about it this morning    Data Reviewed: I have personally reviewed following labs and imaging studies  CBC: Recent Labs  Lab 04/29/21 2221 04/30/21 0727 05/01/21 0439 05/02/21 0357  WBC 8.4 8.7 9.1 6.8  HGB 9.1* 8.8* 7.6* 7.1*  HCT 28.9* 29.1* 25.1* 23.8*  MCV 83.5 85.3 87.5 88.8  PLT 178 178 160 164   Basic Metabolic Panel: Recent Labs  Lab 04/29/21 2221 04/30/21 0727 05/01/21 0439 05/02/21 0357  NA 140 141 142 141  K 4.7 4.8 4.8 4.5  CL 108 108 112* 110  CO2 26 26 25 26   GLUCOSE 163* 143* 141* 127*  BUN 37* 40* 47* 53*  CREATININE  0.91 1.07* 1.26* 1.22*  CALCIUM 8.7* 8.7* 8.3* 8.3*  MG 1.8 2.0  --   --    GFR: Estimated Creatinine Clearance: 35.7 mL/min (A) (by C-G formula based on SCr of 1.22 mg/dL (H)). Liver Function Tests: Recent Labs  Lab 04/29/21 2221 04/30/21 0727  AST 20 17  ALT 22 20  ALKPHOS 83 76  BILITOT 0.9 0.7  PROT 6.6 6.6  ALBUMIN 3.1* 3.1*   No results for input(s): LIPASE, AMYLASE in the last 168 hours. No results for input(s): AMMONIA in the last 168 hours. Coagulation Profile: Recent Labs  Lab 04/30/21 0727  INR 1.2   Cardiac Enzymes: No results for input(s): CKTOTAL, CKMB, CKMBINDEX, TROPONINI in the last 168 hours. BNP (last 3 results) No results for input(s): PROBNP in the last 8760 hours. HbA1C: No results for input(s): HGBA1C in the last 72 hours. CBG: Recent Labs  Lab 05/01/21 1147 05/01/21 1819 05/02/21 0104 05/02/21 0615 05/02/21 1245  GLUCAP 120* 130* 136* 123* 108*   Lipid Profile: No results for input(s): CHOL, HDL, LDLCALC, TRIG, CHOLHDL, LDLDIRECT in the last 72 hours. Thyroid Function Tests: No results for input(s): TSH, T4TOTAL, FREET4, T3FREE, THYROIDAB in the last 72 hours. Anemia Panel: Recent Labs    04/29/21 2221  FOLATE 16.0  FERRITIN 114  TIBC 298  IRON 26*  RETICCTPCT 2.3   Urine analysis:    Component Value Date/Time   COLORURINE YELLOW (A) 10/28/2020 1632   APPEARANCEUR HAZY (A) 10/28/2020 1632   APPEARANCEUR Clear 05/10/2017 0932   LABSPEC 1.015 10/28/2020 1632   LABSPEC 1.038 06/17/2014 1357   PHURINE 5.0 10/28/2020 1632   GLUCOSEU NEGATIVE 10/28/2020 1632   GLUCOSEU Negative 06/17/2014 1357   HGBUR NEGATIVE 10/28/2020 1632   BILIRUBINUR NEGATIVE 10/28/2020 1632   BILIRUBINUR Negative 05/10/2017 0932   BILIRUBINUR Negative 06/17/2014 1357   KETONESUR NEGATIVE 10/28/2020 1632   PROTEINUR 100 (A) 10/28/2020 1632   NITRITE NEGATIVE 10/28/2020 1632   LEUKOCYTESUR SMALL (A) 10/28/2020 1632   LEUKOCYTESUR Negative 06/17/2014  1357   Sepsis Labs: @LABRCNTIP (procalcitonin:4,lacticidven:4)  ) Recent Results (from the past 240 hour(s))  Resp Panel by RT-PCR (Flu A&B, Covid) Nasopharyngeal Swab     Status: None   Collection Time: 04/29/21 11:59 PM   Specimen: Nasopharyngeal Swab; Nasopharyngeal(NP) swabs in vial transport medium  Result Value Ref Range Status   SARS Coronavirus 2 by RT PCR NEGATIVE NEGATIVE Final    Comment: (NOTE) SARS-CoV-2 target nucleic acids are NOT DETECTED.  The SARS-CoV-2 RNA is generally detectable in upper respiratory specimens during the acute phase of infection. The lowest concentration of SARS-CoV-2 viral copies this assay can detect is 138 copies/mL. A negative result does not preclude SARS-Cov-2 infection and should not be used as the sole basis for  treatment or other patient management decisions. A negative result may occur with  improper specimen collection/handling, submission of specimen other than nasopharyngeal swab, presence of viral mutation(s) within the areas targeted by this assay, and inadequate number of viral copies(<138 copies/mL). A negative result must be combined with clinical observations, patient history, and epidemiological information. The expected result is Negative.  Fact Sheet for Patients:  BloggerCourse.com  Fact Sheet for Healthcare Providers:  SeriousBroker.it  This test is no t yet approved or cleared by the [place] FDA and  has been authorized for detection and/or diagnosis of SARS-CoV-2 by FDA under an Emergency Use Authorization (EUA). This EUA will remain  in effect (meaning this test can be used) for the duration of the COVID-19 declaration under Section 564(b)(1) of the Act, 21 U.S.C.section 360bbb-3(b)(1), unless the authorization is terminated  or revoked sooner.       Influenza A by PCR NEGATIVE NEGATIVE Final   Influenza B by PCR NEGATIVE NEGATIVE Final    Comment:  (NOTE) The Xpert Xpress SARS-CoV-2/FLU/RSV plus assay is intended as an aid in the diagnosis of influenza from Nasopharyngeal swab specimens and should not be used as a sole basis for treatment. Nasal washings and aspirates are unacceptable for Xpert Xpress SARS-CoV-2/FLU/RSV testing.  Fact Sheet for Patients: BloggerCourse.com  Fact Sheet for Healthcare Providers: SeriousBroker.it  This test is not yet approved or cleared by the [place] FDA and has been authorized for detection and/or diagnosis of SARS-CoV-2 by FDA under an Emergency Use Authorization (EUA). This EUA will remain in effect (meaning this test can be used) for the duration of the COVID-19 declaration under Section 564(b)(1) of the Act, 21 U.S.C. section 360bbb-3(b)(1), unless the authorization is terminated or revoked.  Performed at [facility], [address], [place], [place] [address]          Radiology Studies: DG HIP OPERATIVE Lucienne Capers OR W/O PELVIS LEFT  Result Date: 04/30/2021 CLINICAL DATA:  Left hip IM nail. EXAM: OPERATIVE LEFT HIP (WITH PELVIS IF PERFORMED) TECHNIQUE: Fluoroscopic spot image(s) were submitted for interpretation post-operatively. COMPARISON:  Preoperative imaging. FINDINGS: Eleven fluoroscopic spot views of the left hip and femur obtained in the operating room. Intramedullary nail with trans trochanteric and distal locking screw traverse proximal femur fracture. Fluoroscopy time reported as 2.9. IMPRESSION: Procedural fluoroscopy for ORIF left proximal femur fracture. Electronically Signed   By: Narda Rutherford M.D.   On: 04/30/2021 15:38        Scheduled Meds:  acetaminophen  1,000 mg Oral Q8H   Chlorhexidine Gluconate Cloth  6 each Topical Daily   divalproex  250 mg Oral QHS   docusate sodium  100 mg Oral BID   enoxaparin (LOVENOX) injection  40 mg Subcutaneous Q24H   ferrous sulfate  325 mg Oral Q48H   insulin  aspart  0-6 Units Subcutaneous Q6H   ipratropium-albuterol  3 mL Nebulization BID   metoprolol tartrate  25 mg Oral BID   sertraline  50 mg Oral Daily   Continuous Infusions:  sodium chloride 75 mL/hr at 05/02/21 0400   methocarbamol (ROBAXIN) IV       LOS: 3 days    Time spent: 25 min    Silvano Bilis, MD [facility]   If 7PM-7AM, please contact night-coverage [email or web address] Password [facility] 05/02/2021, 12:54 PM

## 2021-05-03 LAB — BASIC METABOLIC PANEL
Anion gap: 4 — ABNORMAL LOW (ref 5–15)
BUN: 40 mg/dL — ABNORMAL HIGH (ref 8–23)
CO2: 26 mmol/L (ref 22–32)
Calcium: 8.5 mg/dL — ABNORMAL LOW (ref 8.9–10.3)
Chloride: 114 mmol/L — ABNORMAL HIGH (ref 98–111)
Creatinine, Ser: 0.83 mg/dL (ref 0.44–1.00)
GFR, Estimated: >60 mL/min (ref >60)
Glucose, Bld: 129 mg/dL — ABNORMAL HIGH (ref 70–99)
Potassium: 4.5 mmol/L (ref 3.5–5.1)
Sodium: 144 mmol/L (ref 135–145)

## 2021-05-03 LAB — CBC
HCT: 26.8 % — ABNORMAL LOW (ref 36.0–46.0)
Hemoglobin: 8.3 g/dL — ABNORMAL LOW (ref 12.0–15.0)
MCH: 26.9 pg (ref 26.0–34.0)
MCHC: 31 g/dL (ref 30.0–36.0)
MCV: 87 fL (ref 80.0–100.0)
Platelets: 171 10*3/uL (ref 150–400)
RBC: 3.08 MIL/uL — ABNORMAL LOW (ref 3.87–5.11)
RDW: 16.3 % — ABNORMAL HIGH (ref 11.5–15.5)
WBC: 6.1 10*3/uL (ref 4.0–10.5)
nRBC: 0 % (ref 0.0–0.2)

## 2021-05-03 LAB — TYPE AND SCREEN
ABO/RH(D): A POS
Antibody Screen: NEGATIVE
Unit division: 0

## 2021-05-03 LAB — BPAM RBC
Blood Product Expiration Date: 202207232359
ISSUE DATE / TIME: 202206251154
Unit Type and Rh: 6200

## 2021-05-03 LAB — RESP PANEL BY RT-PCR (FLU A&B, COVID) ARPGX2
Influenza A by PCR: NEGATIVE
Influenza B by PCR: NEGATIVE
SARS Coronavirus 2 by RT PCR: NEGATIVE

## 2021-05-03 LAB — GLUCOSE, CAPILLARY
Glucose-Capillary: 125 mg/dL — ABNORMAL HIGH (ref 70–99)
Glucose-Capillary: 127 mg/dL — ABNORMAL HIGH (ref 70–99)
Glucose-Capillary: 141 mg/dL — ABNORMAL HIGH (ref 70–99)

## 2021-05-03 NOTE — TOC Transition Note (Signed)
Transition of Care [facility]) - CM/SW Discharge Note   Patient Details  Name: [name] MRN: [id number] Date of Birth: 05-Jul-1932  Transition of Care [facility]) CM/SW Contact:  Luvenia Redden, RN Phone Number: [phone] 05/03/2021, 2:17 PM   Clinical Narrative:    Sherron Monday with son [name]) today and confirm bed acceptance at [facility] healthcare for rehabilitation. Pt spoke with [facility] Archie Patten) to confirm any request. Will needs pt's vaccination card or a copy. Pt came from [facility] and son indicated he would occur this information and sen to the provider number via representative from [employer]. Attending aware pt will need a co-vid test prior to admission to the SNF.  TOC team will remain available for any additional needs.    Final next level of care: Skilled Nursing Facility Barriers to Discharge: Barriers Resolved   Patient Goals and CMS Choice        Discharge Placement              Patient chooses bed at: [facility]   Name of family member notified: Aicia Babinski Patient and family notified of of transfer: 05/03/21  Discharge Plan and Services                                     Social Determinants of Health (SDOH) Interventions     Readmission Risk Interventions Readmission Risk Prevention Plan 11/03/2020 05/28/2019 05/27/2019  Transportation Screening Complete Complete Complete  PCP or Specialist Appt within 3-5 Days (No Data) - -  HRI or Home Care Consult Complete - -  Social Work Consult for Recovery Care Planning/Counseling - - Complete  Palliative Care Screening Not Applicable - -  Medication Review Oceanographer) Complete Complete Complete  PCP or Specialist appointment within 3-5 days of discharge - (No Data) -  HRI or Home Care Consult - Complete -  Palliative Care Screening - Not Applicable -  Skilled Nursing Facility - Not Applicable -  Some recent data might be hidden

## 2021-05-03 NOTE — Progress Notes (Signed)
Physical Therapy Treatment Patient Details Name: [name] MRN: [id number] DOB: Jun 14, 1932 Today's Date: 05/03/2021    History of Present Illness [name] is a 87 y.o. female who sustained an intertrochanteric fracture after a fall. Underwent intramedullary nailing of Left femur with cephalomedullary device on 04/30/2021.    PT Comments    Patient yelling out for someone to help her during the session despite reassurance and assistance with mobility efforts. She has difficulty following commands and continues to require total assistance for bed mobility and requires significant assistance to maintain sitting position. Recommend to continue PT to maximize independence and decrease caregiver burden with transition to SNF at discharge.    Follow Up Recommendations  SNF;Supervision/Assistance - 24 hour     Equipment Recommendations   (to be determined at next level of care)    Recommendations for Other Services       Precautions / Restrictions Precautions Precautions: Fall Restrictions Weight Bearing Restrictions: Yes LLE Weight Bearing: Weight bearing as tolerated    Mobility  Bed Mobility Overal bed mobility: Needs Assistance Bed Mobility: Supine to Sit;Sit to Supine     Supine to sit: Total assist Sit to supine: Total assist   General bed mobility comments: assistance for BLE and trunl support. patient does not assist with bed mobility efforts despite cues for task initiation.    Transfers                 General transfer comment: unable to attempt due to poor sitting tolerance and decreased activity tolerance, poor participation  Ambulation/Gait                 Stairs             Wheelchair Mobility    Modified Rankin (Stroke Patients Only)       Balance Overall balance assessment: History of Falls;Needs assistance Sitting-balance support: Feet unsupported;No upper extremity supported Sitting balance-Leahy Scale:  Zero Sitting balance - Comments: patient required brief periods of Mod A with mostly total assistance for sitting balance. loss of balance posteriorly                                    Cognition Arousal/Alertness: Lethargic Behavior During Therapy: Anxious (yelling out during session) Overall Cognitive Status: History of cognitive impairments - at baseline                                 General Comments: patient oriented to self. she needs constant re-direction and attention to task. patient has diffiuclty following commands and has eyes closed most of the time. patient continues to yell out "help" even with constant reassurance      Exercises      General Comments        Pertinent Vitals/Pain Pain Location: patient is unable to state if she has pain, however patient is grimicing with ROM and mobility of LLE Pain Descriptors / Indicators: Moaning Pain Intervention(s): Limited activity within patient's tolerance;Ice applied (ice pack applied to left upper thigh/hip at end of session)    Home Living                      Prior Function            PT Goals (current goals can now be found in the care plan  section) Acute Rehab PT Goals Patient Stated Goal: none stated PT Goal Formulation: With patient Time For Goal Achievement: 05/15/21 Potential to Achieve Goals: Fair Progress towards PT goals: Not progressing toward goals - comment    Frequency    7X/week      PT Plan Current plan remains appropriate    Co-evaluation              AM-PAC PT "6 Clicks" Mobility   Outcome Measure  Help needed turning from your back to your side while in a flat bed without using bedrails?: A Lot Help needed moving from lying on your back to sitting on the side of a flat bed without using bedrails?: Total Help needed moving to and from a bed to a chair (including a wheelchair)?: Total Help needed standing up from a chair using your arms  (e.g., wheelchair or bedside chair)?: Total Help needed to walk in hospital room?: Total Help needed climbing 3-5 steps with a railing? : Total 6 Click Score: 7    End of Session Equipment Utilized During Treatment: Oxygen Activity Tolerance:  (limited by cognition, fatigue) Patient left: in bed;with call bell/phone within reach;with bed alarm set;with SCD's reapplied (ice pack applied left thigh/hip) Nurse Communication: Mobility status PT Visit Diagnosis: Unsteadiness on feet (R26.81);Other abnormalities of gait and mobility (R26.89);Muscle weakness (generalized) (M62.81);History of falling (Z91.81);Difficulty in walking, not elsewhere classified (R26.2);Pain Pain - Right/Left: Left Pain - part of body: Hip     Time: 4742-5956 PT Time Calculation (min) (ACUTE ONLY): 19 min  Charges:  $Therapeutic Activity: 8-22 mins                     Donna Bernard, PT, MPT   Ina Homes 05/03/2021, 1:03 PM

## 2021-05-03 NOTE — Progress Notes (Signed)
PROGRESS NOTE    [name]  ZOX:[id number] DOB: 12-21-1931 DOA: 04/29/2021 PCP: Lauro Regulus, MD  Outpatient Specialists: cardiology    Brief Narrative:   Presented from SNF after unwitnessed fall, found to have left femoral neck fracture.   Assessment & Plan:   Principal Problem:   Closed left hip fracture (HCC) Active Problems:   Anxiety   Diabetes (HCC)   HTN (hypertension)   Chronic diastolic (congestive) heart failure (HCC)   Acute on chronic anemia   Fall   Chronic respiratory failure with hypoxia (HCC)   # Acute left femoral neck fracture Neurovascularly intact. Operative repair 6/23 - cont home tramadol bid - morphine for severe pain - cont  miralax - lovenox for 4 wks 4 wks - ortho f/u 2 weeks - Weight bearing as tolerated - for SNF  # Fall CT head and c-spine w/o acute abnormality. Has bruise right forehead  # Acute blood loss anemia 2/2 hip fracture. 7.1 6/26, transfused 1 unit, today hgb 8.3 from baseline 11s - trend - started oral iron - keep hgb >8  # Acute kidney injury Resolved w/ fluids yesterday - monitor off fluids, po is poor so may need more fluids  # Chronic hypoxic respiratory failure # COPD Appears stable, O2 requirement is at baseline - [place] O2 - home meds  # Chronic diastolic heart failure Appears euvolemic - hold lasix while receiving fluids  # T2DM Here glucose wnl - SSI, hold home metformin  # HTN Here bp low normal - resumed home metoprolol, holding home amlodipine  # Dementia # Anxiety # Delirium - tele-sitter - cont home depakote and seroquel prn - hold home lorazepam - cont home sertraline   DVT prophylaxis: lovenox Code Status: DNR Family Communication: son updated telephonically 6/25  Level of care: Med-Surg Status is: Inpatient  Remains inpatient appropriate because:Inpatient level of care appropriate due to severity of illness  Dispo: The patient is from: SNF               Anticipated d/c is to: SNF, SW aware              Patient currently is not medically stable to d/c.   Difficult to place patient No   Consultants:  orthopedics  Procedures: none  Antimicrobials:  none    Subjective: In bed, doesn't respond to questions, says "help" from time to time. Limited po intake this morning.  Objective: Vitals:   05/02/21 1952 05/02/21 2310 05/03/21 0439 05/03/21 0743  BP: (!) 128/50 (!) 132/45 (!) 131/58 (!) 142/52  Pulse: 86 82 88 98  Resp: 20 18 20 18   Temp: 99.2 F (37.3 C) 98.5 F (36.9 C) 99 F (37.2 C) 98.8 F (37.1 C)  TempSrc: Oral Oral Oral Oral  SpO2: 96% 96% 93% 96%  Weight:        Intake/Output Summary (Last 24 hours) at 05/03/2021 1106 Last data filed at 05/02/2021 1820 Gross per 24 hour  Intake 1615.81 ml  Output --  Net 1615.81 ml   Filed Weights   04/29/21 2216  Weight: 85.3 kg    Examination:  General exam: elderly Respiratory system: Clear to auscultation. Respiratory effort normal. Cardiovascular system: S1 & S2 heard, RRR. Soft systolic murmur. No edema Gastrointestinal system: Abdomen is obese, soft and nontender. No organomegaly or masses felt. Normal bowel sounds heard. Central nervous system: awake and alert, moving all extremities Extremities: warm.  Skin: echymosis right forehead Psychiatry: unable to assess  Data Reviewed: I have personally reviewed following labs and imaging studies  CBC: Recent Labs  Lab 04/29/21 2221 04/30/21 0727 05/01/21 0439 05/02/21 0357 05/03/21 0508  WBC 8.4 8.7 9.1 6.8 6.1  HGB 9.1* 8.8* 7.6* 7.1* 8.3*  HCT 28.9* 29.1* 25.1* 23.8* 26.8*  MCV 83.5 85.3 87.5 88.8 87.0  PLT 178 178 160 164 171   Basic Metabolic Panel: Recent Labs  Lab 04/29/21 2221 04/30/21 0727 05/01/21 0439 05/02/21 0357 05/03/21 0508  NA 140 141 142 141 144  K 4.7 4.8 4.8 4.5 4.5  CL 108 108 112* 110 114*  CO2 26 26 25 26 26   GLUCOSE 163* 143* 141* 127* 129*  BUN 37* 40* 47* 53* 40*   CREATININE 0.91 1.07* 1.26* 1.22* 0.83  CALCIUM 8.7* 8.7* 8.3* 8.3* 8.5*  MG 1.8 2.0  --   --   --    GFR: Estimated Creatinine Clearance: 52.5 mL/min (by C-G formula based on SCr of 0.83 mg/dL). Liver Function Tests: Recent Labs  Lab 04/29/21 2221 04/30/21 0727  AST 20 17  ALT 22 20  ALKPHOS 83 76  BILITOT 0.9 0.7  PROT 6.6 6.6  ALBUMIN 3.1* 3.1*   No results for input(s): LIPASE, AMYLASE in the last 168 hours. No results for input(s): AMMONIA in the last 168 hours. Coagulation Profile: Recent Labs  Lab 04/30/21 0727  INR 1.2   Cardiac Enzymes: No results for input(s): CKTOTAL, CKMB, CKMBINDEX, TROPONINI in the last 168 hours. BNP (last 3 results) No results for input(s): PROBNP in the last 8760 hours. HbA1C: No results for input(s): HGBA1C in the last 72 hours. CBG: Recent Labs  Lab 05/02/21 0615 05/02/21 1245 05/02/21 1804 05/02/21 2350 05/03/21 0509  GLUCAP 123* 108* 138* 143* 125*   Lipid Profile: No results for input(s): CHOL, HDL, LDLCALC, TRIG, CHOLHDL, LDLDIRECT in the last 72 hours. Thyroid Function Tests: No results for input(s): TSH, T4TOTAL, FREET4, T3FREE, THYROIDAB in the last 72 hours. Anemia Panel: No results for input(s): VITAMINB12, FOLATE, FERRITIN, TIBC, IRON, RETICCTPCT in the last 72 hours.  Urine analysis:    Component Value Date/Time   COLORURINE YELLOW (A) 10/28/2020 1632   APPEARANCEUR HAZY (A) 10/28/2020 1632   APPEARANCEUR Clear 05/10/2017 0932   LABSPEC 1.015 10/28/2020 1632   LABSPEC 1.038 06/17/2014 1357   PHURINE 5.0 10/28/2020 1632   GLUCOSEU NEGATIVE 10/28/2020 1632   GLUCOSEU Negative 06/17/2014 1357   HGBUR NEGATIVE 10/28/2020 1632   BILIRUBINUR NEGATIVE 10/28/2020 1632   BILIRUBINUR Negative 05/10/2017 0932   BILIRUBINUR Negative 06/17/2014 1357   KETONESUR NEGATIVE 10/28/2020 1632   PROTEINUR 100 (A) 10/28/2020 1632   NITRITE NEGATIVE 10/28/2020 1632   LEUKOCYTESUR SMALL (A) 10/28/2020 1632   LEUKOCYTESUR  Negative 06/17/2014 1357   Sepsis Labs: @LABRCNTIP (procalcitonin:4,lacticidven:4)  ) Recent Results (from the past 240 hour(s))  Resp Panel by RT-PCR (Flu A&B, Covid) Nasopharyngeal Swab     Status: None   Collection Time: 04/29/21 11:59 PM   Specimen: Nasopharyngeal Swab; Nasopharyngeal(NP) swabs in vial transport medium  Result Value Ref Range Status   SARS Coronavirus 2 by RT PCR NEGATIVE NEGATIVE Final    Comment: (NOTE) SARS-CoV-2 target nucleic acids are NOT DETECTED.  The SARS-CoV-2 RNA is generally detectable in upper respiratory specimens during the acute phase of infection. The lowest concentration of SARS-CoV-2 viral copies this assay can detect is 138 copies/mL. A negative result does not preclude SARS-Cov-2 infection and should not be used as the sole basis for treatment or other patient  management decisions. A negative result may occur with  improper specimen collection/handling, submission of specimen other than nasopharyngeal swab, presence of viral mutation(s) within the areas targeted by this assay, and inadequate number of viral copies(<138 copies/mL). A negative result must be combined with clinical observations, patient history, and epidemiological information. The expected result is Negative.  Fact Sheet for Patients:  BloggerCourse.com  Fact Sheet for Healthcare Providers:  SeriousBroker.it  This test is no t yet approved or cleared by the [place] FDA and  has been authorized for detection and/or diagnosis of SARS-CoV-2 by FDA under an Emergency Use Authorization (EUA). This EUA will remain  in effect (meaning this test can be used) for the duration of the COVID-19 declaration under Section 564(b)(1) of the Act, 21 U.S.C.section 360bbb-3(b)(1), unless the authorization is terminated  or revoked sooner.       Influenza A by PCR NEGATIVE NEGATIVE Final   Influenza B by PCR NEGATIVE NEGATIVE Final     Comment: (NOTE) The Xpert Xpress SARS-CoV-2/FLU/RSV plus assay is intended as an aid in the diagnosis of influenza from Nasopharyngeal swab specimens and should not be used as a sole basis for treatment. Nasal washings and aspirates are unacceptable for Xpert Xpress SARS-CoV-2/FLU/RSV testing.  Fact Sheet for Patients: BloggerCourse.com  Fact Sheet for Healthcare Providers: SeriousBroker.it  This test is not yet approved or cleared by the [place] FDA and has been authorized for detection and/or diagnosis of SARS-CoV-2 by FDA under an Emergency Use Authorization (EUA). This EUA will remain in effect (meaning this test can be used) for the duration of the COVID-19 declaration under Section 564(b)(1) of the Act, 21 U.S.C. section 360bbb-3(b)(1), unless the authorization is terminated or revoked.  Performed at [facility], [address], [place], [place] [address]          Radiology Studies: No results found.      Scheduled Meds:  acetaminophen  1,000 mg Oral Q8H   Chlorhexidine Gluconate Cloth  6 each Topical Daily   divalproex  250 mg Oral QHS   docusate sodium  100 mg Oral BID   enoxaparin (LOVENOX) injection  40 mg Subcutaneous Q24H   ferrous sulfate  325 mg Oral Q48H   insulin aspart  0-6 Units Subcutaneous Q6H   ipratropium-albuterol  3 mL Nebulization BID   metoprolol tartrate  25 mg Oral BID   sertraline  50 mg Oral Daily   traMADol  50 mg Oral Q12H   Continuous Infusions:  methocarbamol (ROBAXIN) IV       LOS: 4 days    Time spent: 25 min    Silvano Bilis, MD [facility]   If 7PM-7AM, please contact night-coverage [email or web address] Password [facility] 05/03/2021, 11:06 AM

## 2021-05-03 NOTE — Progress Notes (Signed)
  Subjective: 3 Days Post-Op Procedure(s) (LRB): INTRAMEDULLARY (IM) NAIL INTERTROCHANTRIC (Left) Patient reports pain as moderate.  Confused and calling out today repetitively. Patient is well, and has had no acute complaints or problems.  Received 1 unit of transfused blood yesterday with improvement on hemoglobin. Plan is to go Rehab after hospital stay. Negative for chest pain and shortness of breath Fever: no Gastrointestinal: Negative for nausea and vomiting  Objective: Vital signs in last 24 hours: Temp:  [97.7 F (36.5 C)-99.2 F (37.3 C)] 99 F (37.2 C) (06/26 0439) Pulse Rate:  [82-95] 88 (06/26 0439) Resp:  [18-20] 20 (06/26 0439) BP: (116-154)/(43-58) 131/58 (06/26 0439) SpO2:  [93 %-99 %] 93 % (06/26 0439)  Intake/Output from previous day:  Intake/Output Summary (Last 24 hours) at 05/03/2021 0641 Last data filed at 05/02/2021 1820 Gross per 24 hour  Intake 2721.17 ml  Output --  Net 2721.17 ml    Intake/Output this shift: No intake/output data recorded.  Labs: Recent Labs    04/30/21 0727 05/01/21 0439 05/02/21 0357 05/03/21 0508  HGB 8.8* 7.6* 7.1* 8.3*   Recent Labs    05/02/21 0357 05/03/21 0508  WBC 6.8 6.1  RBC 2.68* 3.08*  HCT 23.8* 26.8*  PLT 164 171   Recent Labs    05/02/21 0357 05/03/21 0508  NA 141 144  K 4.5 4.5  CL 110 114*  CO2 26 26  BUN 53* 40*  CREATININE 1.22* 0.83  GLUCOSE 127* 129*  CALCIUM 8.3* 8.5*   Recent Labs    04/30/21 0727  INR 1.2     EXAM General - Patient is Alert and Confused Extremity - Neurovascular intact Sensation intact distally Dorsiflexion/Plantar flexion intact Compartment soft Dressing/Incision - clean, dry, no drainage Motor Function - intact, moving foot and toes well on exam.   Past Medical History:  Diagnosis Date   Anxiety    CHF (congestive heart failure) (HCC)    COPD (chronic obstructive pulmonary disease) (HCC)    Dementia (HCC)    Depression    Diabetes mellitus without  complication (HCC)    GERD (gastroesophageal reflux disease)    Hip fracture (HCC)    Hyperlipemia    Hypertension    Neuropathy    Vertigo     Assessment/Plan: 3 Days Post-Op Procedure(s) (LRB): INTRAMEDULLARY (IM) NAIL INTERTROCHANTRIC (Left) Principal Problem:   Closed left hip fracture (HCC) Active Problems:   Anxiety   Diabetes (HCC)   HTN (hypertension)   Chronic diastolic (congestive) heart failure (HCC)   Acute on chronic anemia   Fall   Chronic respiratory failure with hypoxia (HCC)  Estimated body mass index is 31.28 kg/m as calculated from the following:   Height as of 12/05/20: 5\' 5"  (1.651 m).   Weight as of this encounter: 85.3 kg. Advance diet Up with therapy  Acute blood loss anemia postoperative with hemoglobin 8.3.  Received 1 unit packed red blood cell transfused yesterday with improvement.  Follow-up with [facility] clinic orthopedics in 2 weeks for staple removal and x-rays of the left femur  DVT Prophylaxis - Lovenox, Foot Pumps, and TED hose Weight-Bearing as tolerated to left leg  [facility], PA-C Orthopaedic Surgery 05/03/2021, 6:41 AM

## 2021-05-04 LAB — GLUCOSE, CAPILLARY
Glucose-Capillary: 124 mg/dL — ABNORMAL HIGH (ref 70–99)
Glucose-Capillary: 131 mg/dL — ABNORMAL HIGH (ref 70–99)
Glucose-Capillary: 138 mg/dL — ABNORMAL HIGH (ref 70–99)
Glucose-Capillary: 152 mg/dL — ABNORMAL HIGH (ref 70–99)

## 2021-05-04 MED ORDER — ASPIRIN EC 81 MG PO TBEC: 81.0000 mg | DELAYED_RELEASE_TABLET | Freq: Every day | ORAL | 11 refills | Status: DC

## 2021-05-04 MED ORDER — SENNOSIDES-DOCUSATE SODIUM 8.6-50 MG PO TABS: 1.0000 | ORAL_TABLET | Freq: Two times a day (BID) | ORAL | Status: AC

## 2021-05-04 NOTE — Care Management Important Message (Signed)
Important Message  Patient Details  Name: [name] MRN: [id number] Date of Birth: Jul 25, 1932   Medicare Important Message Given:  Yes     Johnell Comings 05/04/2021, 11:36 AM

## 2021-05-04 NOTE — Plan of Care (Signed)
DISCHARGE NOTE SNF [name] to be discharged [facility] per MD order.   Skin clean, dry and intact without evidence of skin break down, no evidence of skin tears noted. IV catheter discontinued intact. Site without signs and symptoms of complications. Dressing and pressure applied.   Patient free of lines, drains, and wounds.   Discharge packet assembled. An After Visit Summary (AVS) was printed and given to the EMS personnel. Patient escorted via stretcher and discharged to [facility] via ambulance. Attempted to call report x 2 to facility. Arlice Colt, RN

## 2021-05-04 NOTE — TOC Progression Note (Signed)
Transition of Care [facility]) - Progression Note    Patient Details  Name: [name] MRN: [id number] Date of Birth: 12-28-1931  Transition of Care [facility]) CM/SW Contact  Barrie Dunker, RN Phone Number: 05/04/2021, 11:17 AM  Clinical Narrative:    Called community care hospice and the patient revoked her hospice when she came into the hospital,   They will fax her revocation form once obtained, The nurse from the field has that at this time    Barriers to Discharge: Barriers Resolved  Expected Discharge Plan and Services                                                 Social Determinants of Health (SDOH) Interventions    Readmission Risk Interventions Readmission Risk Prevention Plan 11/03/2020 05/28/2019 05/27/2019  Transportation Screening Complete Complete Complete  PCP or Specialist Appt within 3-5 Days (No Data) - -  HRI or Home Care Consult Complete - -  Social Work Consult for Recovery Care Planning/Counseling - - Complete  Palliative Care Screening Not Applicable - -  Medication Review Oceanographer) Complete Complete Complete  PCP or Specialist appointment within 3-5 days of discharge - (No Data) -  HRI or Home Care Consult - Complete -  Palliative Care Screening - Not Applicable -  Skilled Nursing Facility - Not Applicable -  Some recent data might be hidden

## 2021-05-04 NOTE — TOC Progression Note (Signed)
Transition of Care [facility]) - Progression Note    Patient Details  Name: [name] MRN: [id number] Date of Birth: 02-Aug-1932  Transition [facility] Care [facility]) CM/SW Contact  Barrie Dunker, RN Phone Number: 05/04/2021, 12:37 PM  Clinical Narrative:     Received the DC summary from [facility] and faxed it to [facility] to [facility], I called and left a VM for the patient's son asking for vaccine status and dates, called [facility] and left a VM for Liborio Nixon to call back with the vaccine status    Barriers to Discharge: Barriers Resolved  Expected Discharge Plan and Services                                                 Social Determinants of Health (SDOH) Interventions    Readmission Risk Interventions Readmission Risk Prevention Plan 11/03/2020 05/28/2019 05/27/2019  Transportation Screening Complete Complete Complete  PCP or Specialist Appt within 3-5 Days (No Data) - -  HRI or Home Care Consult Complete - -  Social Work Consult for Recovery Care Planning/Counseling - - Complete  Palliative Care Screening Not Applicable - -  Medication Review Oceanographer) Complete Complete Complete  PCP or Specialist appointment within 3-5 days of discharge - (No Data) -  HRI or Home Care Consult - Complete -  Palliative Care Screening - Not Applicable -  Skilled Nursing Facility - Not Applicable -  Some recent data might be hidden

## 2021-05-04 NOTE — TOC Progression Note (Signed)
Transition of Care [facility]) - Progression Note    Patient Details  Name: Deette Revak MRN: [id number] Date of Birth: 07-Nov-1932  Transition [facility] Care [facility]) CM/SW Contact  Barrie Dunker, RN Phone Number: 05/04/2021, 10:04 AM  Clinical Narrative:    Put in a call to [name] the patient's son to inquire on the vaccine status, left a VM for a call back, Plan to DC the patient to [facility] today.     Barriers to Discharge: Barriers Resolved  Expected Discharge Plan and Services                                                 Social Determinants of Health (SDOH) Interventions    Readmission Risk Interventions Readmission Risk Prevention Plan 11/03/2020 05/28/2019 05/27/2019  Transportation Screening Complete Complete Complete  PCP or Specialist Appt within 3-5 Days (No Data) - -  HRI or Home Care Consult Complete - -  Social Work Consult for Recovery Care Planning/Counseling - - Complete  Palliative Care Screening Not Applicable - -  Medication Review Oceanographer) Complete Complete Complete  PCP or Specialist appointment within 3-5 days of discharge - (No Data) -  HRI or Home Care Consult - Complete -  Palliative Care Screening - Not Applicable -  Skilled Nursing Facility - Not Applicable -  Some recent data might be hidden

## 2021-05-04 NOTE — TOC Progression Note (Signed)
Transition of Care [facility]) - Progression Note    Patient Details  Name: [name] MRN: [id number] Date of Birth: 02/03/1932  Transition of Care [facility]) CM/SW Contact  Barrie Dunker, RN Phone Number: 05/04/2021, 3:32 PM  Clinical Narrative:     Sherron Monday to [name] the patients son and let him know that she is being transported to [facility] via EMS, he agreed I called EMS to set up transport    Barriers to Discharge: Barriers Resolved  Expected Discharge Plan and Services           Expected Discharge Date: 05/04/21                                     Social Determinants of Health (SDOH) Interventions    Readmission Risk Interventions Readmission Risk Prevention Plan 11/03/2020 05/28/2019 05/27/2019  Transportation Screening Complete Complete Complete  PCP or Specialist Appt within 3-5 Days (No Data) - -  HRI or Home Care Consult Complete - -  Social Work Consult for Recovery Care Planning/Counseling - - Complete  Palliative Care Screening Not Applicable - -  Medication Review Oceanographer) Complete Complete Complete  PCP or Specialist appointment within 3-5 days of discharge - (No Data) -  HRI or Home Care Consult - Complete -  Palliative Care Screening - Not Applicable -  Skilled Nursing Facility - Not Applicable -  Some recent data might be hidden

## 2021-05-04 NOTE — Discharge Summary (Signed)
[name] FIE:[id number] DOB: 09-09-1932 DOA: 04/29/2021  PCP: Lauro Regulus, MD  Admit date: 04/29/2021 Discharge date: 05/04/2021  Time spent: 35 minutes  Recommendations for Outpatient Follow-up:  Orthopedics f/u 2 weeks    Discharge Diagnoses:  Principal Problem:   Closed left hip fracture [facility]) Active Problems:   Anxiety   Diabetes (HCC)   HTN (hypertension)   Chronic diastolic (congestive) heart failure (HCC)   Acute on chronic anemia   Fall   Chronic respiratory failure with hypoxia (HCC)   Discharge Condition: stable  Diet recommendation: mechanical soft  Filed Weights   04/29/21 2216  Weight: 85.3 kg    History of present illness:   [name] is a 86 y.o. female with medical history significant for chronic hypoxic respiratory failure on continuous 3 to 4 L [place], COPD, type 2 diabetes mellitus, essential hypertension, chronic anemia with baseline hemoglobin 10.5-11, dementia, who is admitted to [facility] on 04/29/2021 with acute left femoral neck fracture after presenting from SNF to [facility] ED for evaluation of fall.    In the context of the patient's dementia, the following history is provided by SNF staff as well as my discussions with the emergency department physician and via chart review.   The patient reportedly experienced an unwitnessed fall at her SNF earlier today.  Specific timing of this fall is not entirely clear, although the patient was found on the ground of her room, consciousness, and attempting to extricate herself from the floor.  Patient was without acute complaint at that time, but subsequently brought to [facility] ED for further evaluation of the above fall.    Per chart review, in terms of recent modifications to her outpatient medication regimen, it appears that the patient was recently started on scheduled tramadol 50 mg p.o. twice daily.   In the setting of a documented history of COPD and chronic diastolic  heart failure, the patient is a history of chronic hypoxic respiratory failure, which, per chart review, appears to be associated with baseline continuous 3 to 4 L nasal cannula.  Her outpatient respiratory regimen appears to consist of scheduled duonebs.  Most recent echocardiogram was performed in December 2021, which showed LVEF 60 to 65%, no evidence of focal wall motion normalities, normal diastolic parameters, and mild mitral regurgitation.  Outpatient diuretic regimen consists of Lasix 40 mg p.o. daily.  She also has a history of type 2 diabetes mellitus for which she is on metformin as her sole outpatient oral hypoglycemic agent in the absence of any exogenous insulin as an outpatient.  Additionally, per chart review, she has a documented history of chronic anemia associate with baseline hemoglobin 10.5-11, and is on a daily baby aspirin as result outpatient blood thinner.   Hospital Course:  # Acute left femoral neck fracture Neurovascularly intact. Operative repair 6/23 - cont  miralax, opioids for pain control - lovenox for 4 wks through 7/21 - ortho f/u 2 weeks   # Fall CT head and c-spine w/o acute abnormality. Has bruise right forehead   # Acute blood loss anemia 2/2 hip fracture. 7.1 6/26, transfused 1 unit, today hgb stable after that  # Dementia # Anxiety # Delirium She was intermittently delirious here - cont home depakote and seroquel prn - hold home lorazepam - cont home sertraline  # Acute kidney injury Resolved w/ fluids    # Chronic hypoxic respiratory failure # COPD Appears stable, O2 requirement is at baseline   # Chronic  diastolic heart failure Appears euvolemic   # T2DM Here glucose wnl   # HTN Here bp low normal - resumed home metoprolol, holding home amlodipine     Procedures: Intramedullary nailing of Left femur with cephalomedullary device  Consultations: Orthopedic surgery  Discharge Exam: Vitals:   05/04/21 0814 05/04/21 1148  BP:  (!) 161/64 (!) 169/68  Pulse: (!) 110 (!) 101  Resp: 18 17  Temp: 99.3 F (37.4 C) 100.3 F (37.9 C)  SpO2: 94% 93%    General exam: elderly Respiratory system: Clear to auscultation. Respiratory effort normal. Cardiovascular system: S1 & S2 heard, RRR. Soft systolic murmur. No edema Gastrointestinal system: Abdomen is obese, soft and nontender. No organomegaly or masses felt. Normal bowel sounds heard. Central nervous system: awake and alert, moving all extremities Extremities: warm. Skin: echymosis right forehead Psychiatry: unable to assess    Discharge Instructions   Discharge Instructions     Diet - low sodium heart healthy   Complete by: As directed    Increase activity slowly   Complete by: As directed    Leave dressing on - Keep it clean, dry, and intact until clinic visit   Complete by: As directed       Allergies as of 05/04/2021       Reactions   Ampicillin    Zpac   Sulfa Antibiotics         Medication List     TAKE these medications    acetaminophen 500 MG tablet Commonly known as: TYLENOL Take 1,000 mg by mouth 3 (three) times daily.   alum & mag hydroxide-simeth 200-200-20 MG/5ML suspension Commonly known as: MAALOX/MYLANTA Take 30 mLs by mouth as needed for indigestion or heartburn.   amLODipine 5 MG tablet Commonly known as: NORVASC Take 5 mg by mouth daily.   aspirin EC 81 MG tablet Take 1 tablet (81 mg total) by mouth daily. Hold while taking lovenox What changed: additional instructions   azelastine 0.1 % nasal spray Commonly known as: ASTELIN Place 1 spray into both nostrils 2 (two) times daily. Use in each nostril as directed   bismuth subsalicylate 262 MG/15ML suspension Commonly known as: PEPTO BISMOL Take 262 mg by mouth every 2 (two) hours as needed for indigestion (up to 6 doses in 24 hr).   calcium carbonate 500 MG chewable tablet Commonly known as: TUMS - dosed in mg elemental calcium Chew 2 tablets (400 mg of  elemental calcium total) by mouth 3 (three) times daily as needed for indigestion or heartburn.   dicyclomine 20 MG tablet Commonly known as: BENTYL Take 20 mg by mouth 4 (four) times daily.   diphenoxylate-atropine 2.5-0.025 MG tablet Commonly known as: LOMOTIL Take 1 tablet by mouth 2 (two) times daily as needed for diarrhea or loose stools.   divalproex 250 MG 24 hr tablet Commonly known as: DEPAKOTE ER Take 250 mg by mouth at bedtime.   enoxaparin 40 MG/0.4ML injection Commonly known as: LOVENOX Inject 0.4 mLs (40 mg total) into the skin daily for 14 days.   esomeprazole 20 MG capsule Commonly known as: NEXIUM Take 20 mg by mouth daily.   feeding supplement Liqd Take 237 mLs by mouth 2 (two) times daily between meals.   furosemide 40 MG tablet Commonly known as: LASIX Take 1 tablet (40 mg total) by mouth daily.   ipratropium-albuterol 0.5-2.5 (3) MG/3ML Soln Commonly known as: DUONEB Take 3 mLs by nebulization 2 (two) times daily.   loperamide 2 MG tablet Commonly  known as: IMODIUM A-D Take 2 mg by mouth as needed for diarrhea or loose stools (up to 4 doses in 12 hrs.).   loratadine 10 MG tablet Commonly known as: CLARITIN Take 10 mg by mouth daily.   LORazepam 0.5 MG tablet Commonly known as: ATIVAN Take 0.25 mg by mouth every 8 (eight) hours as needed for anxiety.   magnesium hydroxide 400 MG/5ML suspension Commonly known as: MILK OF MAGNESIA Take 30 mLs by mouth daily as needed for mild constipation.   melatonin 3 MG Tabs tablet Take 3 mg by mouth at bedtime as needed.   metFORMIN 500 MG 24 hr tablet Commonly known as: GLUCOPHAGE-XR Take 500 mg by mouth 2 (two) times a day.   metoprolol tartrate 25 MG tablet Commonly known as: LOPRESSOR Take 1 tablet (25 mg total) by mouth 2 (two) times daily.   multivitamin with minerals Tabs tablet Take 1 tablet by mouth daily.   nystatin powder Commonly known as: MYCOSTATIN/NYSTOP Apply 1 g topically 2 (two)  times daily as needed (to affected area(s)).   olopatadine 0.1 % ophthalmic solution Commonly known as: PATANOL Place 1 drop into both eyes daily.   ondansetron 4 MG tablet Commonly known as: ZOFRAN Take 4 mg by mouth every 8 (eight) hours as needed for nausea or vomiting.   oxyCODONE 5 MG immediate release tablet Commonly known as: Oxy IR/ROXICODONE Take 0.5-1 tablets (2.5-5 mg total) by mouth every 6 (six) hours as needed for moderate pain (pain score 4-6).   oxyCODONE 5 MG immediate release tablet Commonly known as: Oxy IR/ROXICODONE Take 1-2 tablets (5-10 mg total) by mouth every 4 (four) hours as needed for severe pain (pain score 7-10).   psyllium 58.6 % powder Commonly known as: METAMUCIL Take 1 packet by mouth 2 (two) times a day.   QUEtiapine 25 MG tablet Commonly known as: SEROQUEL Take 1 tablet (25 mg total) by mouth at bedtime as needed (psychosis, insomnia). What changed: when to take this   ROBITUSSIN CF PO Take 10 mLs by mouth every 6 (six) hours as needed (cough).   senna-docusate 8.6-50 MG tablet Commonly known as: Senokot-S Take 1 tablet by mouth 2 (two) times daily.   sertraline 50 MG tablet Commonly known as: ZOLOFT Take 50 mg by mouth daily.   sodium phosphate Pediatric 3.5-9.5 GM/59ML enema Place 1 enema rectally once as needed for severe constipation.   traMADol 50 MG tablet Commonly known as: ULTRAM Take 1 tablet (50 mg total) by mouth every 6 (six) hours as needed for moderate pain. What changed:  when to take this reasons to take this   Vitamin D 50 MCG (2000 UT) tablet Take 2,000 Units by mouth daily.               Discharge Care Instructions  (From admission, onward)           Start     Ordered   05/04/21 0000  Leave dressing on - Keep it clean, dry, and intact until clinic visit        05/04/21 1301           Allergies  Allergen Reactions   Ampicillin     Zpac   Sulfa Antibiotics     Follow-up Information      Dedra Skeens, PA-C. Schedule an appointment as soon as possible for a visit in 2 week(s).   Specialty: Orthopedic Surgery Why: For staple removal and left femur x-rays Contact information: [address]  [facility] [place] [address] [phone]                  The results of significant diagnostics from this hospitalization (including imaging, microbiology, ancillary and laboratory) are listed below for reference.    Significant Diagnostic Studies: DG Chest 1 View  Result Date: 04/29/2021 CLINICAL DATA:  Recent fall with known left hip fracture, initial encounter EXAM: CHEST  1 VIEW COMPARISON:  10/28/2020 FINDINGS: Cardiac shadow is enlarged but stable. Aortic calcifications are again seen. Postsurgical changes are noted and stable. No focal infiltrate or sizable effusion is seen. Mild chronic interstitial changes are noted. IMPRESSION: Chronic interstitial changes without acute abnormality. Electronically Signed   By: Alcide Clever M.D.   On: 04/29/2021 22:48   CT Head Wo Contrast  Result Date: 04/29/2021 CLINICAL DATA:  87 year old female with head trauma. EXAM: CT HEAD WITHOUT CONTRAST CT CERVICAL SPINE WITHOUT CONTRAST TECHNIQUE: Multidetector CT imaging of the head and cervical spine was performed following the standard protocol without intravenous contrast. Multiplanar CT image reconstructions of the cervical spine were also generated. COMPARISON:  Head CT dated 10/08/2020. FINDINGS: Evaluation of this exam is limited due to motion artifact. CT HEAD FINDINGS Brain: Moderate age-related atrophy and chronic microvascular ischemic changes. There is no acute intracranial hemorrhage. No mass effect or midline shift. No extra-axial fluid collection. Vascular: No hyperdense vessel or unexpected calcification. Skull: Normal. Negative for fracture or focal lesion. Sinuses/Orbits: No acute finding. Other: Mild right forehead contusion. CT CERVICAL SPINE FINDINGS  Alignment: No acute subluxation. Skull base and vertebrae: No acute fracture. Osteopenia. Chronic appearing fracture of C6 with anterior wedging. Soft tissues and spinal canal: No prevertebral fluid or swelling. No visible canal hematoma. Disc levels:  Multilevel degenerative changes and facet arthropathy. Upper chest: Negative. Other: Large left thyroid nodule measuring up to 6 cm. In the setting of significant comorbidities or limited life expectancy, no follow-up recommended (ref: J Am Coll Radiol. 2015 Feb;12(2): 143-50). IMPRESSION: 1. No acute intracranial pathology. Moderate age-related atrophy and chronic microvascular ischemic changes. 2. No acute/traumatic cervical spine pathology. Multilevel degenerative changes. Electronically Signed   By: Elgie Collard M.D.   On: 04/29/2021 23:27   CT Cervical Spine Wo Contrast  Result Date: 04/29/2021 CLINICAL DATA:  87 year old female with head trauma. EXAM: CT HEAD WITHOUT CONTRAST CT CERVICAL SPINE WITHOUT CONTRAST TECHNIQUE: Multidetector CT imaging of the head and cervical spine was performed following the standard protocol without intravenous contrast. Multiplanar CT image reconstructions of the cervical spine were also generated. COMPARISON:  Head CT dated 10/08/2020. FINDINGS: Evaluation of this exam is limited due to motion artifact. CT HEAD FINDINGS Brain: Moderate age-related atrophy and chronic microvascular ischemic changes. There is no acute intracranial hemorrhage. No mass effect or midline shift. No extra-axial fluid collection. Vascular: No hyperdense vessel or unexpected calcification. Skull: Normal. Negative for fracture or focal lesion. Sinuses/Orbits: No acute finding. Other: Mild right forehead contusion. CT CERVICAL SPINE FINDINGS Alignment: No acute subluxation. Skull base and vertebrae: No acute fracture. Osteopenia. Chronic appearing fracture of C6 with anterior wedging. Soft tissues and spinal canal: No prevertebral fluid or swelling.  No visible canal hematoma. Disc levels:  Multilevel degenerative changes and facet arthropathy. Upper chest: Negative. Other: Large left thyroid nodule measuring up to 6 cm. In the setting of significant comorbidities or limited life expectancy, no follow-up recommended (ref: J Am Coll Radiol. 2015 Feb;12(2): 143-50). IMPRESSION: 1. No acute intracranial pathology. Moderate age-related atrophy and chronic microvascular ischemic changes.  2. No acute/traumatic cervical spine pathology. Multilevel degenerative changes. Electronically Signed   By: Elgie Collard M.D.   On: 04/29/2021 23:27   DG HIP OPERATIVE UNILAT W OR W/O PELVIS LEFT  Result Date: 04/30/2021 CLINICAL DATA:  Left hip IM nail. EXAM: OPERATIVE LEFT HIP (WITH PELVIS IF PERFORMED) TECHNIQUE: Fluoroscopic spot image(s) were submitted for interpretation post-operatively. COMPARISON:  Preoperative imaging. FINDINGS: Eleven fluoroscopic spot views of the left hip and femur obtained in the operating room. Intramedullary nail with trans trochanteric and distal locking screw traverse proximal femur fracture. Fluoroscopy time reported as 2.9. IMPRESSION: Procedural fluoroscopy for ORIF left proximal femur fracture. Electronically Signed   By: Narda Rutherford M.D.   On: 04/30/2021 15:38   DG Hip Unilat W or Wo Pelvis 2-3 Views Left  Result Date: 04/29/2021 CLINICAL DATA:  87 year old female with fall and left hip pain. EXAM: DG HIP (WITH OR WITHOUT PELVIS) 2-3V LEFT COMPARISON:  None. FINDINGS: There is a mildly displaced and angulated fracture of the left femoral neck predominantly involving the intertrochanteric and subtrochanteric region. There is varus angulation. No dislocation. The bones are osteopenic. The soft tissues are unremarkable. IMPRESSION: Mildly displaced and angulated fracture of the left femoral neck. No dislocation. Electronically Signed   By: Elgie Collard M.D.   On: 04/29/2021 22:53    Microbiology: Recent Results (from the  past 240 hour(s))  Resp Panel by RT-PCR (Flu A&B, Covid) Nasopharyngeal Swab     Status: None   Collection Time: 04/29/21 11:59 PM   Specimen: Nasopharyngeal Swab; Nasopharyngeal(NP) swabs in vial transport medium  Result Value Ref Range Status   SARS Coronavirus 2 by RT PCR NEGATIVE NEGATIVE Final    Comment: (NOTE) SARS-CoV-2 target nucleic acids are NOT DETECTED.  The SARS-CoV-2 RNA is generally detectable in upper respiratory specimens during the acute phase of infection. The lowest concentration of SARS-CoV-2 viral copies this assay can detect is 138 copies/mL. A negative result does not preclude SARS-Cov-2 infection and should not be used as the sole basis for treatment or other patient management decisions. A negative result may occur with  improper specimen collection/handling, submission of specimen other than nasopharyngeal swab, presence of viral mutation(s) within the areas targeted by this assay, and inadequate number of viral copies(<138 copies/mL). A negative result must be combined with clinical observations, patient history, and epidemiological information. The expected result is Negative.  Fact Sheet for Patients:  BloggerCourse.com  Fact Sheet for Healthcare Providers:  SeriousBroker.it  This test is no t yet approved or cleared by the [place] FDA and  has been authorized for detection and/or diagnosis of SARS-CoV-2 by FDA under an Emergency Use Authorization (EUA). This EUA will remain  in effect (meaning this test can be used) for the duration of the COVID-19 declaration under Section 564(b)(1) of the Act, 21 U.S.C.section 360bbb-3(b)(1), unless the authorization is terminated  or revoked sooner.       Influenza A by PCR NEGATIVE NEGATIVE Final   Influenza B by PCR NEGATIVE NEGATIVE Final    Comment: (NOTE) The Xpert Xpress SARS-CoV-2/FLU/RSV plus assay is intended as an aid in the diagnosis of  influenza from Nasopharyngeal swab specimens and should not be used as a sole basis for treatment. Nasal washings and aspirates are unacceptable for Xpert Xpress SARS-CoV-2/FLU/RSV testing.  Fact Sheet for Patients: BloggerCourse.com  Fact Sheet for Healthcare Providers: SeriousBroker.it  This test is not yet approved or cleared by the [place] FDA and has been authorized for detection and/or  diagnosis of SARS-CoV-2 by FDA under an Emergency Use Authorization (EUA). This EUA will remain in effect (meaning this test can be used) for the duration of the COVID-19 declaration under Section 564(b)(1) of the Act, 21 U.S.C. section 360bbb-3(b)(1), unless the authorization is terminated or revoked.  Performed at [facility], [address] [place], [place] [address]   Resp Panel by RT-PCR (Flu A&B, Covid) Nasopharyngeal Swab     Status: None   Collection Time: 05/03/21  2:17 PM   Specimen: Nasopharyngeal Swab; Nasopharyngeal(NP) swabs in vial transport medium  Result Value Ref Range Status   SARS Coronavirus 2 by RT PCR NEGATIVE NEGATIVE Final    Comment: (NOTE) SARS-CoV-2 target nucleic acids are NOT DETECTED.  The SARS-CoV-2 RNA is generally detectable in upper respiratory specimens during the acute phase of infection. The lowest concentration of SARS-CoV-2 viral copies this assay can detect is 138 copies/mL. A negative result does not preclude SARS-Cov-2 infection and should not be used as the sole basis for treatment or other patient management decisions. A negative result may occur with  improper specimen collection/handling, submission of specimen other than nasopharyngeal swab, presence of viral mutation(s) within the areas targeted by this assay, and inadequate number of viral copies(<138 copies/mL). A negative result must be combined with clinical observations, patient history, and epidemiological information.  The expected result is Negative.  Fact Sheet for Patients:  BloggerCourse.com  Fact Sheet for Healthcare Providers:  SeriousBroker.it  This test is no t yet approved or cleared by the [place] FDA and  has been authorized for detection and/or diagnosis of SARS-CoV-2 by FDA under an Emergency Use Authorization (EUA). This EUA will remain  in effect (meaning this test can be used) for the duration of the COVID-19 declaration under Section 564(b)(1) of the Act, 21 U.S.C.section 360bbb-3(b)(1), unless the authorization is terminated  or revoked sooner.       Influenza A by PCR NEGATIVE NEGATIVE Final   Influenza B by PCR NEGATIVE NEGATIVE Final    Comment: (NOTE) The Xpert Xpress SARS-CoV-2/FLU/RSV plus assay is intended as an aid in the diagnosis of influenza from Nasopharyngeal swab specimens and should not be used as a sole basis for treatment. Nasal washings and aspirates are unacceptable for Xpert Xpress SARS-CoV-2/FLU/RSV testing.  Fact Sheet for Patients: BloggerCourse.com  Fact Sheet for Healthcare Providers: SeriousBroker.it  This test is not yet approved or cleared by the [place] FDA and has been authorized for detection and/or diagnosis of SARS-CoV-2 by FDA under an Emergency Use Authorization (EUA). This EUA will remain in effect (meaning this test can be used) for the duration of the COVID-19 declaration under Section 564(b)(1) of the Act, 21 U.S.C. section 360bbb-3(b)(1), unless the authorization is terminated or revoked.  Performed at [facility], [address] [place], [place] [address]      Labs: Basic Metabolic Panel: Recent Labs  Lab 04/29/21 2221 04/30/21 0727 05/01/21 0439 05/02/21 0357 05/03/21 0508  NA 140 141 142 141 144  K 4.7 4.8 4.8 4.5 4.5  CL 108 108 112* 110 114*  CO2 26 26 25 26 26   GLUCOSE 163* 143* 141* 127*  129*  BUN 37* 40* 47* 53* 40*  CREATININE 0.91 1.07* 1.26* 1.22* 0.83  CALCIUM 8.7* 8.7* 8.3* 8.3* 8.5*  MG 1.8 2.0  --   --   --    Liver Function Tests: Recent Labs  Lab 04/29/21 2221 04/30/21 0727  AST 20 17  ALT 22 20  ALKPHOS 83 76  BILITOT 0.9 0.7  PROT 6.6 6.6  ALBUMIN 3.1* 3.1*   No results for input(s): LIPASE, AMYLASE in the last 168 hours. No results for input(s): AMMONIA in the last 168 hours. CBC: Recent Labs  Lab 04/29/21 2221 04/30/21 0727 05/01/21 0439 05/02/21 0357 05/03/21 0508  WBC 8.4 8.7 9.1 6.8 6.1  HGB 9.1* 8.8* 7.6* 7.1* 8.3*  HCT 28.9* 29.1* 25.1* 23.8* 26.8*  MCV 83.5 85.3 87.5 88.8 87.0  PLT 178 178 160 164 171   Cardiac Enzymes: No results for input(s): CKTOTAL, CKMB, CKMBINDEX, TROPONINI in the last 168 hours. BNP: BNP (last 3 results) Recent Labs    10/28/20 1512  BNP 224.1*    ProBNP (last 3 results) No results for input(s): PROBNP in the last 8760 hours.  CBG: Recent Labs  Lab 05/03/21 1629 05/04/21 0002 05/04/21 0510 05/04/21 0553 05/04/21 1148  GLUCAP 141* 124* 131* 152* 138*       Signed:  Silvano Bilis MD.  [facility] 05/04/2021, 1:01 PM

## 2021-05-04 NOTE — Progress Notes (Signed)
  Subjective: 4 Days Post-Op Procedure(s) (LRB): INTRAMEDULLARY (IM) NAIL INTERTROCHANTRIC (Left) Patient reports pain as moderate.  Confused and calling out today repetitively. Patient is well, and has had no acute complaints or problems.  Received 1 unit of transfused blood Saturday with improvement on hemoglobin. Plan is to go Rehab after hospital stay. Negative for chest pain and shortness of breath Fever: no Gastrointestinal: Negative for nausea and vomiting  Objective: Vital signs in last 24 hours: Temp:  [98.1 F (36.7 C)-99.3 F (37.4 C)] 99.3 F (37.4 C) (06/27 0814) Pulse Rate:  [86-110] 110 (06/27 0814) Resp:  [18-20] 18 (06/27 0814) BP: (143-172)/(55-64) 161/64 (06/27 0814) SpO2:  [90 %-96 %] 94 % (06/27 0814)  Intake/Output from previous day: No intake or output data in the 24 hours ending 05/04/21 1113   Intake/Output this shift: No intake/output data recorded.  Labs: Recent Labs    05/02/21 0357 05/03/21 0508  HGB 7.1* 8.3*   Recent Labs    05/02/21 0357 05/03/21 0508  WBC 6.8 6.1  RBC 2.68* 3.08*  HCT 23.8* 26.8*  PLT 164 171   Recent Labs    05/02/21 0357 05/03/21 0508  NA 141 144  K 4.5 4.5  CL 110 114*  CO2 26 26  BUN 53* 40*  CREATININE 1.22* 0.83  GLUCOSE 127* 129*  CALCIUM 8.3* 8.5*   No results for input(s): LABPT, INR in the last 72 hours.    EXAM General - Patient is Alert and Confused Extremity - Neurovascular intact Sensation intact distally Dorsiflexion/Plantar flexion intact Compartment soft Dressing/Incision - clean, dry, no drainage Motor Function - intact, moving foot and toes well on exam.   Past Medical History:  Diagnosis Date   Anxiety    CHF (congestive heart failure) (HCC)    COPD (chronic obstructive pulmonary disease) (HCC)    Dementia (HCC)    Depression    Diabetes mellitus without complication (HCC)    GERD (gastroesophageal reflux disease)    Hip fracture (HCC)    Hyperlipemia    Hypertension     Neuropathy    Vertigo     Assessment/Plan: 4 Days Post-Op Procedure(s) (LRB): INTRAMEDULLARY (IM) NAIL INTERTROCHANTRIC (Left) Principal Problem:   Closed left hip fracture (HCC) Active Problems:   Anxiety   Diabetes (HCC)   HTN (hypertension)   Chronic diastolic (congestive) heart failure (HCC)   Acute on chronic anemia   Fall   Chronic respiratory failure with hypoxia (HCC)  Estimated body mass index is 31.28 kg/m as calculated from the following:   Height as of 12/05/20: 5\' 5"  (1.651 m).   Weight as of this encounter: 85.3 kg. Advance diet Up with therapy  Acute blood loss anemia postoperative with hemoglobin 8.3.  Received 1 unit packed red blood cell transfused Saturday with improvement.  Follow-up with [facility] clinic orthopedics in 2 weeks for staple removal and x-rays of the left femur  DVT Prophylaxis - Lovenox, Foot Pumps, and TED hose Weight-Bearing as tolerated to left leg  [facility], PA-C Orthopaedic Surgery 05/04/2021, 11:13 AM

## 2021-05-04 NOTE — Progress Notes (Signed)
Physical Therapy Treatment Patient Details Name: [name] MRN: [id number] DOB: 1932-10-19 Today's Date: 05/04/2021    History of Present Illness [name] is a 87 y.o. female who sustained an intertrochanteric fracture after a fall. Underwent intramedullary nailing of Left femur with cephalomedullary device on 04/30/2021.    PT Comments    Limited progress made this session, limited by cognition and unable/difficulty following commands. Patient has eyes closed and moaning out nonsensically.  Total assistance for repositioning in bed with minimal participation with LE exercises despite multi-modal cues. Recommend to continue PT to maximize independence and decrease caregiver burden.    Follow Up Recommendations  SNF;Supervision/Assistance - 24 hour     Equipment Recommendations       Recommendations for Other Services       Precautions / Restrictions Precautions Precautions: Fall Restrictions Weight Bearing Restrictions: Yes LLE Weight Bearing: Weight bearing as tolerated    Mobility  Bed Mobility Overal bed mobility: Needs Assistance   Rolling: Total assist         General bed mobility comments: no active participation with bed mobility efforts. patient refused to sit up on edge of bed with no reason given. total assistance for repositioning in bed despite cues for participation    Transfers                    Ambulation/Gait                 Stairs             Wheelchair Mobility    Modified Rankin (Stroke Patients Only)       Balance                                            Cognition Arousal/Alertness: Lethargic Behavior During Therapy: Flat affect                                   General Comments: patient unable to follow commands this session      Exercises Total Joint Exercises Ankle Circles/Pumps: AAROM;Both;10 reps;PROM Heel Slides: AAROM;Strengthening;PROM Hip  ABduction/ADduction: AAROM;PROM;Strengthening;Left;10 reps;Supine Straight Leg Raises: AAROM;Strengthening;Left;10 reps;Supine;PROM Other Exercises Other Exercises: verbal cues for participation with LE exercise and active movement. AAROM- PROM provided    General Comments        Pertinent Vitals/Pain Pain Assessment:  (patient unable to state if she has pain, limited by cognition. moaning with LE ROM but also moaning at rest)    Home Living                      Prior Function            PT Goals (current goals can now be found in the care plan section) Acute Rehab PT Goals Patient Stated Goal: none stated PT Goal Formulation: Patient unable to participate in goal setting Time For Goal Achievement: 05/15/21 Potential to Achieve Goals: Fair Progress towards PT goals: Progressing toward goals    Frequency    7X/week      PT Plan Current plan remains appropriate    Co-evaluation              AM-PAC PT "6 Clicks" Mobility   Outcome Measure  Help needed turning from your back to  your side while in a flat bed without using bedrails?: A Lot Help needed moving from lying on your back to sitting on the side of a flat bed without using bedrails?: Total Help needed moving to and from a bed to a chair (including a wheelchair)?: Total Help needed standing up from a chair using your arms (e.g., wheelchair or bedside chair)?: Total Help needed to walk in hospital room?: Total Help needed climbing 3-5 steps with a railing? : Total 6 Click Score: 7    End of Session Equipment Utilized During Treatment: Oxygen Activity Tolerance: Patient limited by fatigue Patient left: in bed;with call bell/phone within reach;with bed alarm set;with SCD's reapplied Nurse Communication: Mobility status PT Visit Diagnosis: Unsteadiness on feet (R26.81);Other abnormalities of gait and mobility (R26.89);Muscle weakness (generalized) (M62.81);History of falling (Z91.81);Difficulty in  walking, not elsewhere classified (R26.2)     Time: 2423-5361 PT Time Calculation (min) (ACUTE ONLY): 13 min  Charges:  $Therapeutic Exercise: 8-22 mins                     Donna Bernard, PT, MPT    Ina Homes 05/04/2021, 2:14 PM

## 2021-11-10 ENCOUNTER — Inpatient Hospital Stay
Admission: EM | Admit: 2021-11-10 | Discharge: 2021-11-12 | DRG: 291 | Disposition: A | Discharge status: Skilled Nursing Facility | Payer: Medicare Other | Source: Skilled Nursing Facility | Attending: Obstetrics and Gynecology | Admitting: Obstetrics and Gynecology

## 2021-11-10 ENCOUNTER — Emergency Department: Payer: Medicare Other

## 2021-11-10 ENCOUNTER — Other Ambulatory Visit: Payer: Self-pay

## 2021-11-10 DIAGNOSIS — W19XXXA Unspecified fall, initial encounter: Secondary | ICD-10-CM | POA: Diagnosis present

## 2021-11-10 DIAGNOSIS — G9349 Other encephalopathy: Secondary | ICD-10-CM | POA: Diagnosis present

## 2021-11-10 DIAGNOSIS — F32A Depression, unspecified: Secondary | ICD-10-CM | POA: Diagnosis not present

## 2021-11-10 DIAGNOSIS — R0902 Hypoxemia: Secondary | ICD-10-CM

## 2021-11-10 DIAGNOSIS — Z880 Allergy status to penicillin: Secondary | ICD-10-CM

## 2021-11-10 DIAGNOSIS — Z8249 Family history of ischemic heart disease and other diseases of the circulatory system: Secondary | ICD-10-CM

## 2021-11-10 DIAGNOSIS — F0394 Unspecified dementia, unspecified severity, with anxiety: Secondary | ICD-10-CM | POA: Diagnosis present

## 2021-11-10 DIAGNOSIS — F05 Delirium due to known physiological condition: Secondary | ICD-10-CM | POA: Diagnosis present

## 2021-11-10 DIAGNOSIS — Z20822 Contact with and (suspected) exposure to covid-19: Secondary | ICD-10-CM | POA: Diagnosis present

## 2021-11-10 DIAGNOSIS — I10 Essential (primary) hypertension: Secondary | ICD-10-CM

## 2021-11-10 DIAGNOSIS — Z7984 Long term (current) use of oral hypoglycemic drugs: Secondary | ICD-10-CM

## 2021-11-10 DIAGNOSIS — I4891 Unspecified atrial fibrillation: Secondary | ICD-10-CM | POA: Diagnosis not present

## 2021-11-10 DIAGNOSIS — Z66 Do not resuscitate: Secondary | ICD-10-CM | POA: Diagnosis present

## 2021-11-10 DIAGNOSIS — I872 Venous insufficiency (chronic) (peripheral): Secondary | ICD-10-CM | POA: Diagnosis present

## 2021-11-10 DIAGNOSIS — F03918 Unspecified dementia, unspecified severity, with other behavioral disturbance: Secondary | ICD-10-CM | POA: Diagnosis present

## 2021-11-10 DIAGNOSIS — E538 Deficiency of other specified B group vitamins: Secondary | ICD-10-CM | POA: Diagnosis present

## 2021-11-10 DIAGNOSIS — K219 Gastro-esophageal reflux disease without esophagitis: Secondary | ICD-10-CM

## 2021-11-10 DIAGNOSIS — E1151 Type 2 diabetes mellitus with diabetic peripheral angiopathy without gangrene: Secondary | ICD-10-CM | POA: Diagnosis present

## 2021-11-10 DIAGNOSIS — Z7982 Long term (current) use of aspirin: Secondary | ICD-10-CM

## 2021-11-10 DIAGNOSIS — Z79899 Other long term (current) drug therapy: Secondary | ICD-10-CM

## 2021-11-10 DIAGNOSIS — E119 Type 2 diabetes mellitus without complications: Secondary | ICD-10-CM

## 2021-11-10 DIAGNOSIS — F419 Anxiety disorder, unspecified: Secondary | ICD-10-CM

## 2021-11-10 DIAGNOSIS — F0393 Unspecified dementia, unspecified severity, with mood disturbance: Secondary | ICD-10-CM | POA: Diagnosis present

## 2021-11-10 DIAGNOSIS — R609 Edema, unspecified: Secondary | ICD-10-CM

## 2021-11-10 DIAGNOSIS — I110 Hypertensive heart disease with heart failure: Secondary | ICD-10-CM | POA: Diagnosis not present

## 2021-11-10 DIAGNOSIS — Z803 Family history of malignant neoplasm of breast: Secondary | ICD-10-CM

## 2021-11-10 DIAGNOSIS — J449 Chronic obstructive pulmonary disease, unspecified: Secondary | ICD-10-CM | POA: Diagnosis present

## 2021-11-10 DIAGNOSIS — I509 Heart failure, unspecified: Secondary | ICD-10-CM | POA: Diagnosis not present

## 2021-11-10 DIAGNOSIS — Z833 Family history of diabetes mellitus: Secondary | ICD-10-CM

## 2021-11-10 DIAGNOSIS — Z882 Allergy status to sulfonamides status: Secondary | ICD-10-CM

## 2021-11-10 DIAGNOSIS — I5033 Acute on chronic diastolic (congestive) heart failure: Secondary | ICD-10-CM | POA: Diagnosis present

## 2021-11-10 DIAGNOSIS — J189 Pneumonia, unspecified organism: Secondary | ICD-10-CM

## 2021-11-10 DIAGNOSIS — Z9981 Dependence on supplemental oxygen: Secondary | ICD-10-CM

## 2021-11-10 DIAGNOSIS — E785 Hyperlipidemia, unspecified: Secondary | ICD-10-CM | POA: Diagnosis present

## 2021-11-10 DIAGNOSIS — Z888 Allergy status to other drugs, medicaments and biological substances status: Secondary | ICD-10-CM

## 2021-11-10 DIAGNOSIS — L0390 Cellulitis, unspecified: Secondary | ICD-10-CM

## 2021-11-10 DIAGNOSIS — J9621 Acute and chronic respiratory failure with hypoxia: Secondary | ICD-10-CM | POA: Diagnosis present

## 2021-11-10 DIAGNOSIS — E1140 Type 2 diabetes mellitus with diabetic neuropathy, unspecified: Secondary | ICD-10-CM | POA: Diagnosis present

## 2021-11-10 LAB — BRAIN NATRIURETIC PEPTIDE: B Natriuretic Peptide: 532.8 pg/mL — ABNORMAL HIGH (ref 0.0–100.0)

## 2021-11-10 LAB — RESP PANEL BY RT-PCR (FLU A&B, COVID) ARPGX2
Influenza A by PCR: NEGATIVE
Influenza B by PCR: NEGATIVE
SARS Coronavirus 2 by RT PCR: NEGATIVE

## 2021-11-10 LAB — CBC
HCT: 36 % (ref 36.0–46.0)
Hemoglobin: 10.4 g/dL — ABNORMAL LOW (ref 12.0–15.0)
MCH: 26.1 pg (ref 26.0–34.0)
MCHC: 28.9 g/dL — ABNORMAL LOW (ref 30.0–36.0)
MCV: 90.2 fL (ref 80.0–100.0)
Platelets: 208 10*3/uL (ref 150–400)
RBC: 3.99 MIL/uL (ref 3.87–5.11)
RDW: 14.3 % (ref 11.5–15.5)
WBC: 5.8 10*3/uL (ref 4.0–10.5)
nRBC: 0 % (ref 0.0–0.2)

## 2021-11-10 LAB — BASIC METABOLIC PANEL
Anion gap: 4 — ABNORMAL LOW (ref 5–15)
BUN: 21 mg/dL (ref 8–23)
CO2: 36 mmol/L — ABNORMAL HIGH (ref 22–32)
Calcium: 8.7 mg/dL — ABNORMAL LOW (ref 8.9–10.3)
Chloride: 99 mmol/L (ref 98–111)
Creatinine, Ser: 0.89 mg/dL (ref 0.44–1.00)
GFR, Estimated: >60 mL/min (ref >60)
Glucose, Bld: 134 mg/dL — ABNORMAL HIGH (ref 70–99)
Potassium: 4.6 mmol/L (ref 3.5–5.1)
Sodium: 139 mmol/L (ref 135–145)

## 2021-11-10 LAB — PROCALCITONIN: Procalcitonin: <0.1 ng/mL

## 2021-11-10 LAB — TROPONIN I (HIGH SENSITIVITY): Troponin I (High Sensitivity): 14 ng/L (ref <18)

## 2021-11-10 MED ORDER — MELATONIN 5 MG PO TABS
5.0000 mg | ORAL_TABLET | Freq: Every evening | ORAL | Status: DC | PRN
  Administered 2021-11-11: 5 mg via ORAL
  Filled 2021-11-10: qty 1

## 2021-11-10 MED ORDER — METOPROLOL TARTRATE 5 MG/5ML IV SOLN: 5.0000 mg | INTRAVENOUS | Status: DC | PRN

## 2021-11-10 MED ORDER — SODIUM CHLORIDE 0.9 % IV SOLN: 2.0000 g | INTRAVENOUS | Status: DC

## 2021-11-10 MED ORDER — VANCOMYCIN HCL IN DEXTROSE 1-5 GM/200ML-% IV SOLN
1000.0000 mg | Freq: Once | INTRAVENOUS | Status: AC
  Administered 2021-11-10: 1000 mg via INTRAVENOUS
  Filled 2021-11-10: qty 200

## 2021-11-10 MED ORDER — HYDRALAZINE HCL 20 MG/ML IJ SOLN: 5.0000 mg | Freq: Four times a day (QID) | INTRAMUSCULAR | Status: DC | PRN

## 2021-11-10 MED ORDER — SODIUM CHLORIDE 0.9 % IV SOLN
2.0000 g | Freq: Once | INTRAVENOUS | Status: AC
  Administered 2021-11-10: 2 g via INTRAVENOUS
  Filled 2021-11-10: qty 2

## 2021-11-10 MED ORDER — LORAZEPAM 2 MG/ML IJ SOLN: 0.2500 mg | Freq: Once | INTRAMUSCULAR | Status: AC

## 2021-11-10 MED ORDER — QUETIAPINE FUMARATE 25 MG PO TABS
25.0000 mg | ORAL_TABLET | Freq: Every evening | ORAL | Status: DC | PRN
  Administered 2021-11-11: 25 mg via ORAL
  Filled 2021-11-10: qty 1

## 2021-11-10 MED ORDER — ENOXAPARIN SODIUM 60 MG/0.6ML IJ SOSY
0.5000 mg/kg | PREFILLED_SYRINGE | INTRAMUSCULAR | Status: DC
  Administered 2021-11-11: 45 mg via SUBCUTANEOUS
  Filled 2021-11-10: qty 0.6

## 2021-11-10 MED ORDER — SERTRALINE HCL 50 MG PO TABS
50.0000 mg | ORAL_TABLET | Freq: Every day | ORAL | Status: DC
  Administered 2021-11-11 – 2021-11-12 (×2): 50 mg via ORAL
  Filled 2021-11-10 (×2): qty 1

## 2021-11-10 MED ORDER — LORAZEPAM 2 MG/ML IJ SOLN
1.0000 mg | Freq: Once | INTRAMUSCULAR | Status: AC
  Administered 2021-11-10: 1 mg via INTRAVENOUS
  Filled 2021-11-10: qty 1

## 2021-11-10 MED ORDER — LORAZEPAM 0.5 MG PO TABS
0.2500 mg | ORAL_TABLET | Freq: Three times a day (TID) | ORAL | Status: DC | PRN
  Administered 2021-11-11 (×2): 0.25 mg via ORAL
  Filled 2021-11-10 (×2): qty 1

## 2021-11-10 MED ORDER — FUROSEMIDE 10 MG/ML IJ SOLN: 40.0000 mg | Freq: Every day | INTRAMUSCULAR | Status: DC

## 2021-11-10 MED ORDER — ENOXAPARIN SODIUM 40 MG/0.4ML IJ SOSY: 40.0000 mg | PREFILLED_SYRINGE | INTRAMUSCULAR | Status: DC

## 2021-11-10 MED ORDER — SODIUM CHLORIDE 0.9% FLUSH: 3.0000 mL | INTRAVENOUS | Status: DC | PRN

## 2021-11-10 MED ORDER — METOPROLOL TARTRATE 25 MG PO TABS
25.0000 mg | ORAL_TABLET | Freq: Two times a day (BID) | ORAL | Status: DC
  Filled 2021-11-10: qty 1

## 2021-11-10 MED ORDER — METFORMIN HCL ER 500 MG PO TB24: 500.0000 mg | ORAL_TABLET | Freq: Two times a day (BID) | ORAL | Status: DC

## 2021-11-10 MED ORDER — METOPROLOL TARTRATE 5 MG/5ML IV SOLN
5.0000 mg | Freq: Once | INTRAVENOUS | Status: AC
  Administered 2021-11-10: 5 mg via INTRAVENOUS
  Filled 2021-11-10: qty 5

## 2021-11-10 MED ORDER — SODIUM CHLORIDE 0.9 % IV SOLN
500.0000 mg | INTRAVENOUS | Status: DC
  Administered 2021-11-11: 500 mg via INTRAVENOUS
  Filled 2021-11-10: qty 5

## 2021-11-10 MED ORDER — SODIUM CHLORIDE 0.9 % IV SOLN: 250.0000 mL | INTRAVENOUS | Status: DC | PRN

## 2021-11-10 MED ORDER — SODIUM CHLORIDE 0.9% FLUSH
3.0000 mL | Freq: Two times a day (BID) | INTRAVENOUS | Status: DC
  Administered 2021-11-11 – 2021-11-12 (×3): 3 mL via INTRAVENOUS

## 2021-11-10 MED ORDER — ACETAMINOPHEN 325 MG PO TABS
650.0000 mg | ORAL_TABLET | ORAL | Status: DC | PRN
  Administered 2021-11-11 – 2021-11-12 (×3): 650 mg via ORAL
  Filled 2021-11-10 (×3): qty 2

## 2021-11-10 MED ORDER — ONDANSETRON HCL 4 MG/2ML IJ SOLN: 4.0000 mg | Freq: Four times a day (QID) | INTRAMUSCULAR | Status: DC | PRN

## 2021-11-10 MED ORDER — ASPIRIN EC 81 MG PO TBEC: 81.0000 mg | DELAYED_RELEASE_TABLET | Freq: Every day | ORAL | Status: DC

## 2021-11-10 MED ORDER — LORAZEPAM 2 MG/ML IJ SOLN
INTRAMUSCULAR | Status: AC
  Administered 2021-11-10: 0.25 mg via INTRAVENOUS
  Filled 2021-11-10: qty 1

## 2021-11-10 MED ORDER — ENSURE ENLIVE PO LIQD
237.0000 mL | Freq: Two times a day (BID) | ORAL | Status: DC
  Administered 2021-11-11: 237 mL via ORAL

## 2021-11-10 MED ORDER — DIVALPROEX SODIUM ER 250 MG PO TB24
250.0000 mg | ORAL_TABLET | Freq: Every day | ORAL | Status: DC
  Administered 2021-11-11: 250 mg via ORAL
  Filled 2021-11-10 (×2): qty 1

## 2021-11-10 MED ORDER — FUROSEMIDE 10 MG/ML IJ SOLN
40.0000 mg | Freq: Once | INTRAMUSCULAR | Status: AC
  Administered 2021-11-10: 40 mg via INTRAVENOUS
  Filled 2021-11-10: qty 4

## 2021-11-10 MED ORDER — AMLODIPINE BESYLATE 5 MG PO TABS: 5.0000 mg | ORAL_TABLET | Freq: Every day | ORAL | Status: DC

## 2021-11-10 NOTE — ED Notes (Signed)
Per attending Dr. Jarrett Soho, try Metoprolol PO- when asked patient if could take meds, she was unable to tell me. She was agitated, refuses to drink water or other drinks. Raised head of bed and attempted to put straw in mouth and pt spit out, refuses apple sauce and cannot tell me if she normally can take pills. Patient spitting and drooling. Since unable to do PO meds asked if any other orders at this time.

## 2021-11-10 NOTE — ED Triage Notes (Signed)
Pt to ED [facility] from [facility] for possible bilateral leg infection.  Pt states she fell 6 weeks ago and has had swelling to left ankle since. Minimal swelling to left ankle noted.  BLE red and peeling Pt answers most orientation questions appropriately. Pt changes story frequently and will say that ankle hurts from fall 6 weeks ago, also it hurts from being put in bed last night   Pt has arm band on from [facility] 04/29/21 that has yet to be removed.

## 2021-11-10 NOTE — Progress Notes (Addendum)
PHARMACIST - PHYSICIAN COMMUNICATION  CONCERNING:  Enoxaparin (Lovenox) for DVT Prophylaxis    RECOMMENDATION: Patient was prescribed enoxaprin 40mg  q24 hours for VTE prophylaxis.   Filed Weights   11/10/21 1206  Weight: 90.7 kg (200 lb)    Body mass index is 33.28 kg/m.  Estimated Creatinine Clearance: 49.6 mL/min (by C-G formula based on SCr of 0.89 mg/dL).   Based on Reading patient is candidate for enoxaparin 0.5mg /kg TBW SQ every 24 hours based on BMI being >30.  DESCRIPTION: Pharmacy has adjusted enoxaparin dose per [facility] policy.  Patient is now receiving enoxaparin 45 mg every 24 hours    Berta Minor, PharmD Clinical Pharmacist  11/10/2021 7:33 PM

## 2021-11-10 NOTE — ED Notes (Signed)
Pt very agitated, ripping off all wires and BP cuff. Trying to get out of bed and hitting when attempting to help patient. Messaged attending regarding how Ativan helped earlier with similar incident of trying to get out of bed and agitation and Ativan ordered. Pt will also need addtl. IV for medications and due to current IV location because patient tucks arm and medications do not flow/line occludes.

## 2021-11-10 NOTE — ED Notes (Signed)
Messaged attendings regarding changes in cardiac monitor- AFIB with right bundle bundle branch block per EKG interp. And increased HR occasionally up to 120. EKG 12 lead obtained approx 930p

## 2021-11-10 NOTE — ED Provider Notes (Signed)
[facility] Provider Note    Event Date/Time   First MD Initiated Contact with Patient 11/10/21 1529     (approximate)   History   Leg Pain   HPI  [name] is a 88 y.o. female , with history of left hip IM nailing roughly half a year ago per orthopedic note dated 09/08/2021, complaining of left knee pain at that time, who presents to the emergency department today with primary complaint of bilateral ankle pain. Unfortunately patient has history of dementia so history is difficult to obtain. The patient says that it has been 2 weeks since she has been having difficulty with her ankles. They have been red as well. The patient has had pain. States she had a fall at some point but is unclear when it was or if it is what caused her ankles to hurt. Per paperwork from living facility they are also concerned about possible CHF.  Additionally history was obtained from staff member at living facility over the phone. The staff member states that they have noticed progressive weakness for the patient over the past couple of weeks. They found patient to be hypoxic to the mid/high 80s today even on her baseline 3L. Provider at the facility had tried increasing her lasix dose without significant clinical change.    Physical Exam   Triage Vital Signs: ED Triage Vitals  Enc Vitals Group     BP 11/10/21 1202 (!) 141/73     Pulse Rate 11/10/21 1202 84     Resp 11/10/21 1202 18     Temp 11/10/21 1202 97.7 F (36.5 C)     Temp Source 11/10/21 1202 Oral     SpO2 11/10/21 1202 93 %     Weight 11/10/21 1206 200 lb (90.7 kg)     Height 11/10/21 1206 5\' 5"  (1.651 m)     Head Circumference --      Peak Flow --      Pain Score 11/10/21 1206 10     Pain Loc --      Pain Edu? --      Excl. in GC? --     Most recent vital signs: Vitals:   11/10/21 1202  BP: (!) 141/73  Pulse: 84  Resp: 18  Temp: 97.7 F (36.5 C)  SpO2: 93%    General: Awake, no distress. Not  completely oriented.  CV:  Good peripheral perfusion.  Resp:  Slightly increased respiratory effort.  Abd:  No distention.  Extremities:     Bilaterally erythema to shins/ankles. Mild swelling. Tender to palpation.    ED Results / Procedures / Treatments   Labs (all labs ordered are listed, but only abnormal results are displayed) Labs Reviewed  CBC - Abnormal; Notable for the following components:      Result Value   Hemoglobin 10.4 (*)    MCHC 28.9 (*)    All other components within normal limits  BASIC METABOLIC PANEL - Abnormal; Notable for the following components:   CO2 36 (*)    Glucose, Bld 134 (*)    Calcium 8.7 (*)    Anion gap 4 (*)    All other components within normal limits  BRAIN NATRIURETIC PEPTIDE     EKG  None   RADIOLOGY Left ankle My interpretation: No acute abnormality Radiology interpretation:   IMPRESSION:  1. No acute fracture or dislocation.  2. Diffuse bony demineralization.   CXR: My interpretation: Pulmonary edema with pleural effusion. Radiology  interpretation: IMPRESSION:  1. Cardiomegaly with pulmonary edema and likely small left pleural  effusion. Superimposed infection/inflammation not excluded. Followup  PA and lateral chest X-ray is recommended in 3-4 weeks following  therapy to ensure resolution and exclude underlying malignancy.  2. Question developing right lower lobe airspace opacity. Limited  evaluation due to patient rotation       PROCEDURES:  Critical Care performed: No  Procedures   MEDICATIONS ORDERED IN ED: Medications - No data to display   IMPRESSION / MDM / ASSESSMENT AND PLAN / ED COURSE  I reviewed the triage vital signs and the nursing notes.   Differential diagnosis includes, but is not limited to, CHF, cellulitis, DVT, liver or kidney failure, contact dermatitis.   Patient presented to the emergency department today with primary concern for bilateral ankle pain.  However in discussion with  staff from living facility the patient has also been found to be increasingly short of breath and they were concerned for CHF exacerbation.  On work-up here patient does have elevation of her BNP.  Additionally patient's chest x-ray is concerning for pulmonary edema.  I do think certainly CHF is playing a role in the patient's symptoms.  Radiology was also concern for possible pneumonia.  While patient did not have any leukocytosis and her blood work will plan on starting antibiotics in case there is a pneumonia.  Patient's BMP with mild elevation of glucose and CO2.  No elevation of the creatinine to suggest acute kidney injury at this time.  In terms of the ankle pain x-ray here without any concerning fracture.  She does have some erythema and warmth though do have concerns for cellulitis.  This will be covered by antibiotics ordered for pneumonia.  Did have a discussion with the hospitalist and patient will be admitted.   FINAL CLINICAL IMPRESSION(S) / ED DIAGNOSES   Final diagnoses:  Acute on chronic congestive heart failure, unspecified heart failure type (HCC)  Cellulitis, unspecified cellulitis site  Pneumonia due to infectious organism, unspecified laterality, unspecified part of lung      Note:  This document was prepared using Dragon voice recognition software and may include unintentional dictation errors.    Phineas Semen, MD 11/10/21 2229

## 2021-11-10 NOTE — ED Notes (Signed)
Pt given sandwich tray and beverage. Acceptable per Dr. Derrill Kay.

## 2021-11-10 NOTE — H&P (Signed)
History and Physical   [name] [id number] DOB: 1932/07/29 DOA: 11/10/2021  PCP: Kirk Ruths, MD  Patient coming from: [place] have personally briefly reviewed patient's old medical records in [place].  Chief Concern: Weakness  HPI: [name] is a 88 y.o. female with medical history significant for hypertension, dementia, non-insulin-dependent diabetes mellitus, depression, GERD, anxiety, history of left femur fracture status postrepair in June 2022, who presents emergency department from [place] for chief concerns of weakness and increased swelling over the last 2 to 3 weeks.  At bedside patient states she does not know her name.  She is not able to tell me her age of her current location.  She does move her extremities of her own volition.  She does not follow my request.  Social history: Patient is from [place].  Vaccination history: Unknown  ROS: Unable to complete as patient appears to have advanced dementia  ED Course: Discussed with emergency medicine provider, patient requiring hospitalization for chief concerns of heart failure exacerbation.  Vitals in the emergency department showed temperature of 98.9, respiration rate of 29, heart rate of 86, blood pressure 140/81, SPO2 of 93% on 4 L nasal cannula.  Labs in the emergency department showed serum sodium 139, potassium 4.6, chloride 99, bicarb 36, BUN of 21, serum creatinine of 0.89, nonfasting blood glucose 134, GFR greater than 60, WBC of 5.8, hemoglobin 10.4, platelets of 208.  BNP was elevated at 532.8, high sensitive troponin was 14.  COVID/influenza A/influenza B PCR were negative.  Procalcitonin has been ordered and is in process.  In the emergency department patient received cefepime IV, vancomycin IV.  Patient also had an episode of delirium and confusion and was given Ativan 1 mg IV.  Lasix 40 mg IV was ordered.  Post Ativan 1 mg IV, per EDP patient is now  sleeping.  Assessment/Plan  Principal Problem:   Acute exacerbation of CHF (congestive heart failure) (HCC) Active Problems:   GERD (gastroesophageal reflux disease)   HLD (hyperlipidemia)   Depression   Anxiety   Diabetes (HCC)   Hypoxia   Acute on chronic respiratory failure with hypoxia (HCC)   HTN (hypertension)   # Weakness-multifactorial including heart failure exacerbation versus developing pneumonia - Check B12 - Procalcitonin is in process - Status post 40 mg of IV furosemide - Ordered additional dose of furosemide 40 mg starting on 11/11/2021, 1 dose ordered - Strict I's and O's - Complete echo ordered - Ceftriaxone 2 g IV and azithromycin  # Query bilateral lower extremity cellulitis-treat as above  # Hypertension-resumed metoprolol tartrate 25 mg p.o. twice daily, amlodipine 5 mg daily - Hydralazine 5 mg IV every 6 hours.  For SBP greater than 180, 4 doses ordered  # Anxiety-resumed lorazepam 0.25 mg p.o. every 8 hours as needed for anxiety  # Psychiatric imbalance and insomnia-resumed home quetiapine 25 mg p.o. nightly as needed for psychosis and insomnia - Resumed melatonin 5 mg nightly as needed for sleep - Depakote 250 mg p.o. nightly resumed  # Depression-resume sertraline 50 mg daily  N.p.o. as patient is now more somnolent after Ativan IV Fall precautions Aspiration precautions  Chart reviewed.   Complete echo on 10/29/2020: Was read as left ventricular ejection fraction by estimation 60 to 65%.  Right ventricular systolic function is normal.  DVT prophylaxis: Enoxaparin 45 mg subcu Code Status: DNR/DNI Diet: N.p.o. Family Communication: no Disposition Plan: Pending clinical course Consults called: None at this  time Admission status: Telemetry medical, observation  Past Medical History:  Diagnosis Date   Anxiety    CHF (congestive heart failure) (HCC)    COPD (chronic obstructive pulmonary disease) (HCC)    Dementia (HCC)    Depression     Diabetes mellitus without complication (HCC)    GERD (gastroesophageal reflux disease)    Hip fracture (HCC)    Hyperlipemia    Hypertension    Neuropathy    Vertigo    Past Surgical History:  Procedure Laterality Date   ABDOMINAL HYSTERECTOMY     APPENDECTOMY     BACK SURGERY     tumor removal bengin   CHOLECYSTECTOMY     INTRAMEDULLARY (IM) NAIL INTERTROCHANTERIC Left 04/30/2021   Procedure: INTRAMEDULLARY (IM) NAIL INTERTROCHANTRIC;  Surgeon: Leim Fabry, MD;  Location: [facility] ORS;  Service: Orthopedics;  Laterality: Left;   Social History:  reports that she has never smoked. She has never used smokeless tobacco. She reports that she does not drink alcohol and does not use drugs.  Allergies  Allergen Reactions   Ampicillin     Zpac   Sulfa Antibiotics    Family History  Problem Relation Age of Onset   Diabetes Mother    Diabetes Sister        x2   Breast cancer Sister    Heart disease Father    Heart disease Brother        x3   Bladder Cancer Neg Hx    Kidney cancer Neg Hx    Prostate cancer Neg Hx    Family history: Family history reviewed and not pertinent.  Prior to Admission medications   Medication Sig Start Date End Date Taking? Authorizing Provider  acetaminophen (TYLENOL) 500 MG tablet Take 1,000 mg by mouth 3 (three) times daily.    [provider]  alum & mag hydroxide-simeth (MAALOX/MYLANTA) 200-200-20 MG/5ML suspension Take 30 mLs by mouth as needed for indigestion or heartburn.    [provider]  amLODipine (NORVASC) 5 MG tablet Take 5 mg by mouth daily.    [provider]  aspirin EC 81 MG tablet Take 1 tablet (81 mg total) by mouth daily. Hold while taking lovenox 05/04/21   Wouk, Ailene Rud, MD  azelastine (ASTELIN) 0.1 % nasal spray Place 1 spray into both nostrils 2 (two) times daily. Use in each nostril as directed    [provider]  bismuth subsalicylate (PEPTO BISMOL) 262 MG/15ML suspension Take 262 mg by  mouth every 2 (two) hours as needed for indigestion (up to 6 doses in 24 hr).     [provider]  calcium carbonate (TUMS - DOSED IN MG ELEMENTAL CALCIUM) 500 MG chewable tablet Chew 2 tablets (400 mg of elemental calcium total) by mouth 3 (three) times daily as needed for indigestion or heartburn. 08/23/18   Loletha Grayer, MD  Cholecalciferol (VITAMIN D) 50 MCG (2000 UT) tablet Take 2,000 Units by mouth daily.    [provider]  dicyclomine (BENTYL) 20 MG tablet Take 20 mg by mouth 4 (four) times daily.    [provider]  diphenoxylate-atropine (LOMOTIL) 2.5-0.025 MG tablet Take 1 tablet by mouth 2 (two) times daily as needed for diarrhea or loose stools.    [provider]  divalproex (DEPAKOTE ER) 250 MG 24 hr tablet Take 250 mg by mouth at bedtime.    [provider]  enoxaparin (LOVENOX) 40 MG/0.4ML injection Inject 0.4 mLs (40 mg total) into the skin daily for  14 days. 05/01/21 05/15/21  Reche Dixon, PA-C  esomeprazole (NEXIUM) 20 MG capsule Take 20 mg by mouth daily.     [provider]  feeding supplement, ENSURE ENLIVE, (ENSURE ENLIVE) LIQD Take 237 mLs by mouth 2 (two) times daily between meals. 10/23/19   Ghimire, Henreitta Leber, MD  furosemide (LASIX) 40 MG tablet Take 1 tablet (40 mg total) by mouth daily. 11/03/20 04/29/21  Sidney Ace, MD  ipratropium-albuterol (DUONEB) 0.5-2.5 (3) MG/3ML SOLN Take 3 mLs by nebulization 2 (two) times daily. 08/23/18   Loletha Grayer, MD  loperamide (IMODIUM A-D) 2 MG tablet Take 2 mg by mouth as needed for diarrhea or loose stools (up to 4 doses in 12 hrs.).    [provider]  loratadine (CLARITIN) 10 MG tablet Take 10 mg by mouth daily.    [provider]  LORazepam (ATIVAN) 0.5 MG tablet Take 0.25 mg by mouth every 8 (eight) hours as needed for anxiety.    [provider]  magnesium hydroxide (MILK OF MAGNESIA) 400 MG/5ML suspension Take 30 mLs by mouth daily as  needed for mild constipation.    [provider]  melatonin 3 MG TABS tablet Take 3 mg by mouth at bedtime as needed.    [provider]  metFORMIN (GLUCOPHAGE-XR) 500 MG 24 hr tablet Take 500 mg by mouth 2 (two) times a day.    [provider]  metoprolol tartrate (LOPRESSOR) 25 MG tablet Take 1 tablet (25 mg total) by mouth 2 (two) times daily. 08/23/18   Loletha Grayer, MD  Multiple Vitamin (MULTIVITAMIN WITH MINERALS) TABS tablet Take 1 tablet by mouth daily. 08/23/18   Loletha Grayer, MD  nystatin (MYCOSTATIN/NYSTOP) powder Apply 1 g topically 2 (two) times daily as needed (to affected area(s)).    [provider]  olopatadine (PATANOL) 0.1 % ophthalmic solution Place 1 drop into both eyes daily.    [provider]  ondansetron (ZOFRAN) 4 MG tablet Take 4 mg by mouth every 8 (eight) hours as needed for nausea or vomiting.    [provider]  oxyCODONE (OXY IR/ROXICODONE) 5 MG immediate release tablet Take 0.5-1 tablets (2.5-5 mg total) by mouth every 6 (six) hours as needed for moderate pain (pain score 4-6). 05/01/21   Reche Dixon, PA-C  oxyCODONE (OXY IR/ROXICODONE) 5 MG immediate release tablet Take 1-2 tablets (5-10 mg total) by mouth every 4 (four) hours as needed for severe pain (pain score 7-10). 05/01/21   Reche Dixon, PA-C  Pseudoephedrine-DM-GG (ROBITUSSIN CF PO) Take 10 mLs by mouth every 6 (six) hours as needed (cough).    [provider]  psyllium (METAMUCIL) 58.6 % powder Take 1 packet by mouth 2 (two) times a day.    [provider]  QUEtiapine (SEROQUEL) 25 MG tablet Take 1 tablet (25 mg total) by mouth at bedtime as needed (psychosis, insomnia). 08/23/18   Loletha Grayer, MD  senna-docusate (SENOKOT-S) 8.6-50 MG tablet Take 1 tablet by mouth 2 (two) times daily. 05/04/21   Wouk, Ailene Rud, MD  sertraline (ZOLOFT) 50 MG tablet Take 50 mg by mouth daily. 10/23/20   [provider]  sodium  phosphate Pediatric (FLEET) 3.5-9.5 GM/59ML enema Place 1 enema rectally once as needed for severe constipation.    [provider]  traMADol (ULTRAM) 50 MG tablet Take 1 tablet (50 mg total) by mouth every 6 (six) hours as needed for moderate pain. 05/01/21   Reche Dixon, PA-C   Physical Exam: Vitals:  11/10/21 1202 11/10/21 1206 11/10/21 1537 11/10/21 1903  BP: (!) 141/73  140/81 136/78  Pulse: 84  86 89  Resp: 18  (!) 29 (!) 24  Temp: 97.7 F (36.5 C)  98.9 F (37.2 C)   TempSrc: Oral  Oral   SpO2: 93%  93% 100%  Weight:  90.7 kg    Height:  5\' 5"  (1.651 m)     Constitutional: appears age-appropriate, frail, NAD, calm, comfortable Eyes: PERRL, lids and conjunctivae normal ENMT: Mucous membranes are moist. Posterior pharynx clear of any exudate or lesions. Age-appropriate dentition.  Unable to fully assess hearing Neck: normal, supple, no masses, no thyromegaly Respiratory: clear to auscultation bilaterally, no wheezing, no crackles. Normal respiratory effort. No accessory muscle use.  Cardiovascular: Regular rate and rhythm, no murmurs / rubs / gallops. No extremity edema. 2+ pedal pulses. No carotid bruits.  Abdomen: Morbidly obese abdomen, with pannus, no tenderness, no masses palpated, no hepatosplenomegaly. Bowel sounds positive.  Musculoskeletal: no clubbing / cyanosis. No joint deformity upper and lower extremities. Good ROM, no contractures, no atrophy. Normal muscle tone.  Skin: no rashes, lesions, ulcers. No induration.  Bilateral lower skin changes.     Neurologic: Sensation intact. Strength 5/5 in all 4.  Psychiatric: Lacks judgment and insight.  Patient is not awake or alert.  Unable to assess mood mood.   EKG: Ordered  Chest x-ray on Admission: I personally reviewed and I agree with radiologist reading as below.  DG Chest 2 View  Result Date: 11/10/2021 CLINICAL DATA:  Sob.  Hx of CHF, COPD, dementia, and diabetes. EXAM: CHEST - 2 VIEW.  Patient is  rotated. COMPARISON:  Chest x-ray 11/10/2021. chest x-ray 10/28/2020, CT chest 06/06/2019 FINDINGS: Limited evaluation due to patient rotation. The heart and mediastinal contours are unchanged given patient rotation. Questionable right lower lobe airspace opacity. Interval increase in interstitial markings. Likely small left pleural effusion. No pneumothorax. No acute osseous abnormality. IMPRESSION: 1. Cardiomegaly with pulmonary edema and likely small left pleural effusion. Superimposed infection/inflammation not excluded. Followup PA and lateral chest X-ray is recommended in 3-4 weeks following therapy to ensure resolution and exclude underlying malignancy. 2. Question developing right lower lobe airspace opacity. Limited evaluation due to patient rotation Electronically Signed   By: Iven Finn M.D.   On: 11/10/2021 18:23   DG Ankle Complete Left  Result Date: 11/10/2021 CLINICAL DATA:  Fall 6 weeks ago, swelling EXAM: LEFT ANKLE COMPLETE - 3+ VIEW COMPARISON:  Left foot radiographs 05/01/2015 FINDINGS: The bones are diffusely demineralized. There is no acute fracture or dislocation. The ankle mortise is intact. There is mild inferior calcaneal spurring. The soft tissues are unremarkable. IMPRESSION: 1. No acute fracture or dislocation. 2. Diffuse bony demineralization. Electronically Signed   By: Valetta Mole M.D.   On: 11/10/2021 12:41   DG Chest Portable 1 View  Result Date: 11/10/2021 CLINICAL DATA:  sob EXAM: PORTABLE CHEST 1 VIEW COMPARISON:  None chest x-ray 04/29/2021 FINDINGS: Markedly rotated radiograph. Unable to evaluate due to overlying soft tissues and osseous structures. No significant pneumothorax. Enlarged cardiac silhouette. IMPRESSION: Markedly rotated radiograph. Unable to evaluate due to overlying soft tissues and osseous structures. Recommend repeat PA and lateral view of the chest. Electronically Signed   By: Iven Finn M.D.   On: 11/10/2021 16:11    Labs on Admission: I  have personally reviewed following labs  CBC: Recent Labs  Lab 11/10/21 1207  WBC 5.8  HGB 10.4*  HCT 36.0  MCV 90.2  PLT [id number]   Basic Metabolic Panel: Recent Labs  Lab 11/10/21 1207  NA 139  K 4.6  CL 99  CO2 36*  GLUCOSE 134*  BUN 21  CREATININE 0.89  CALCIUM 8.7*   GFR: Estimated Creatinine Clearance: 49.6 mL/min (by C-G formula based on SCr of 0.89 mg/dL).  Urine analysis:    Component Value Date/Time   COLORURINE YELLOW (A) 10/28/2020 1632   APPEARANCEUR HAZY (A) 10/28/2020 1632   APPEARANCEUR Clear 05/10/2017 0932   LABSPEC 1.015 10/28/2020 1632   LABSPEC 1.038 06/17/2014 1357   PHURINE 5.0 10/28/2020 1632   GLUCOSEU NEGATIVE 10/28/2020 1632   GLUCOSEU Negative 06/17/2014 1357   HGBUR NEGATIVE 10/28/2020 1632   BILIRUBINUR NEGATIVE 10/28/2020 1632   BILIRUBINUR Negative 05/10/2017 0932   BILIRUBINUR Negative 06/17/2014 1357   KETONESUR NEGATIVE 10/28/2020 1632   PROTEINUR 100 (A) 10/28/2020 1632   NITRITE NEGATIVE 10/28/2020 1632   LEUKOCYTESUR SMALL (A) 10/28/2020 1632   LEUKOCYTESUR Negative 06/17/2014 1357   Dr. Tobie Poet [facility]  If 7PM-7AM, please contact overnight-coverage provider If 7AM-7PM, please contact day coverage provider [email or web address]  11/10/2021, 8:35 PM

## 2021-11-10 NOTE — ED Provider Notes (Signed)
Emergency Medicine Provider Triage Evaluation Note  Miryam Mcelhinney, a 88 y.o. female  was evaluated in triage.  Pt complains of BLE infection.  She presents to the ED via EMS from [facility] healthcare for possible infection.  They endorse some bilateral lower extremity edema, redness, and skin peeling.  Patient would endorse some acute left ankle pain at this time.  Patient has some baseline dementia.  Review of Systems  Positive: BLE edema Negative: FCS  Physical Exam  BP (!) 141/73 (BP Location: Right Arm)    Pulse 84    Temp 97.7 F (36.5 C) (Oral)    Resp 18    Ht 5\' 5"  (1.651 m)    Wt 90.7 kg    SpO2 93%    BMI 33.28 kg/m  Gen:   Awake, no distress  NAD Resp:  Normal effort CTA MSK:   Moves extremities without difficulty BLE edema Other:  CVS: RRR  Medical Decision Making  Medically screening exam initiated at 2:37 PM.  Appropriate orders placed.  Kimba Lottes was informed that the remainder of the evaluation will be completed by another provider, this initial triage assessment does not replace that evaluation, and the importance of remaining in the ED until their evaluation is complete.  Geriatric patient presented to the ED via EMS from [facility] healthcare, for evaluation of bilateral lower extremity edema.  Patient reports some acute left ankle pain due to mechanical fall of unknown duration.   [facility], PA-C 11/10/21 1441    01/08/22, MD 11/10/21 [phone]

## 2021-11-10 NOTE — ED Notes (Signed)
Bed alarm placed.  

## 2021-11-11 ENCOUNTER — Inpatient Hospital Stay: Payer: Medicare Other

## 2021-11-11 ENCOUNTER — Inpatient Hospital Stay: Admit: 2021-11-11 | Payer: Medicare Other

## 2021-11-11 ENCOUNTER — Inpatient Hospital Stay (HOSPITAL_COMMUNITY)
Admit: 2021-11-11 | Discharge: 2021-11-11 | Disposition: A | Discharge status: Home / Self Care | Payer: Medicare Other | Attending: Internal Medicine | Admitting: Internal Medicine

## 2021-11-11 DIAGNOSIS — F03918 Unspecified dementia, unspecified severity, with other behavioral disturbance: Secondary | ICD-10-CM | POA: Diagnosis present

## 2021-11-11 DIAGNOSIS — F0394 Unspecified dementia, unspecified severity, with anxiety: Secondary | ICD-10-CM | POA: Diagnosis present

## 2021-11-11 DIAGNOSIS — Z833 Family history of diabetes mellitus: Secondary | ICD-10-CM | POA: Diagnosis not present

## 2021-11-11 DIAGNOSIS — I110 Hypertensive heart disease with heart failure: Secondary | ICD-10-CM | POA: Diagnosis present

## 2021-11-11 DIAGNOSIS — E538 Deficiency of other specified B group vitamins: Secondary | ICD-10-CM | POA: Diagnosis present

## 2021-11-11 DIAGNOSIS — E785 Hyperlipidemia, unspecified: Secondary | ICD-10-CM | POA: Diagnosis present

## 2021-11-11 DIAGNOSIS — Z803 Family history of malignant neoplasm of breast: Secondary | ICD-10-CM | POA: Diagnosis not present

## 2021-11-11 DIAGNOSIS — E1151 Type 2 diabetes mellitus with diabetic peripheral angiopathy without gangrene: Secondary | ICD-10-CM | POA: Diagnosis present

## 2021-11-11 DIAGNOSIS — F05 Delirium due to known physiological condition: Secondary | ICD-10-CM | POA: Diagnosis present

## 2021-11-11 DIAGNOSIS — I509 Heart failure, unspecified: Secondary | ICD-10-CM | POA: Diagnosis not present

## 2021-11-11 DIAGNOSIS — F32A Depression, unspecified: Secondary | ICD-10-CM | POA: Diagnosis present

## 2021-11-11 DIAGNOSIS — J9621 Acute and chronic respiratory failure with hypoxia: Secondary | ICD-10-CM | POA: Diagnosis present

## 2021-11-11 DIAGNOSIS — Z20822 Contact with and (suspected) exposure to covid-19: Secondary | ICD-10-CM | POA: Diagnosis present

## 2021-11-11 DIAGNOSIS — I872 Venous insufficiency (chronic) (peripheral): Secondary | ICD-10-CM | POA: Diagnosis present

## 2021-11-11 DIAGNOSIS — Z9981 Dependence on supplemental oxygen: Secondary | ICD-10-CM | POA: Diagnosis not present

## 2021-11-11 DIAGNOSIS — K219 Gastro-esophageal reflux disease without esophagitis: Secondary | ICD-10-CM | POA: Diagnosis present

## 2021-11-11 DIAGNOSIS — R0609 Other forms of dyspnea: Secondary | ICD-10-CM | POA: Diagnosis not present

## 2021-11-11 DIAGNOSIS — Z8249 Family history of ischemic heart disease and other diseases of the circulatory system: Secondary | ICD-10-CM | POA: Diagnosis not present

## 2021-11-11 DIAGNOSIS — F0393 Unspecified dementia, unspecified severity, with mood disturbance: Secondary | ICD-10-CM | POA: Diagnosis present

## 2021-11-11 DIAGNOSIS — G9349 Other encephalopathy: Secondary | ICD-10-CM | POA: Diagnosis present

## 2021-11-11 DIAGNOSIS — Z880 Allergy status to penicillin: Secondary | ICD-10-CM | POA: Diagnosis not present

## 2021-11-11 DIAGNOSIS — W19XXXA Unspecified fall, initial encounter: Secondary | ICD-10-CM | POA: Diagnosis present

## 2021-11-11 DIAGNOSIS — I4891 Unspecified atrial fibrillation: Secondary | ICD-10-CM | POA: Diagnosis present

## 2021-11-11 DIAGNOSIS — Z66 Do not resuscitate: Secondary | ICD-10-CM | POA: Diagnosis present

## 2021-11-11 DIAGNOSIS — I5033 Acute on chronic diastolic (congestive) heart failure: Secondary | ICD-10-CM | POA: Diagnosis present

## 2021-11-11 DIAGNOSIS — Z882 Allergy status to sulfonamides status: Secondary | ICD-10-CM | POA: Diagnosis not present

## 2021-11-11 DIAGNOSIS — J449 Chronic obstructive pulmonary disease, unspecified: Secondary | ICD-10-CM | POA: Diagnosis present

## 2021-11-11 LAB — ECHOCARDIOGRAM COMPLETE
AR max vel: 1.65 cm2
AV Area VTI: 1.99 cm2
AV Area mean vel: 1.66 cm2
AV Mean grad: 3 mmHg
AV Peak grad: 4.8 mmHg
Ao pk vel: 1.09 m/s
Area-P 1/2: 4.29 cm2
Height: 65 in
MV VTI: 1.39 cm2
S' Lateral: 2.35 cm
Weight: 3200 oz

## 2021-11-11 LAB — TSH: TSH: 0.969 u[IU]/mL (ref 0.350–4.500)

## 2021-11-11 LAB — BASIC METABOLIC PANEL
Anion gap: 8 (ref 5–15)
BUN: 20 mg/dL (ref 8–23)
CO2: 36 mmol/L — ABNORMAL HIGH (ref 22–32)
Calcium: 8.8 mg/dL — ABNORMAL LOW (ref 8.9–10.3)
Chloride: 96 mmol/L — ABNORMAL LOW (ref 98–111)
Creatinine, Ser: 0.78 mg/dL (ref 0.44–1.00)
GFR, Estimated: >60 mL/min (ref >60)
Glucose, Bld: 118 mg/dL — ABNORMAL HIGH (ref 70–99)
Potassium: 4.2 mmol/L (ref 3.5–5.1)
Sodium: 140 mmol/L (ref 135–145)

## 2021-11-11 LAB — GASTROINTESTINAL PANEL BY PCR, STOOL (REPLACES STOOL CULTURE)

## 2021-11-11 LAB — C DIFFICILE QUICK SCREEN W PCR REFLEX
C Diff antigen: NEGATIVE
C Diff interpretation: NOT DETECTED
C Diff toxin: NEGATIVE

## 2021-11-11 LAB — FOLATE: Folate: 15.1 ng/mL (ref >5.9)

## 2021-11-11 LAB — VITAMIN B12: Vitamin B-12: 135 pg/mL — ABNORMAL LOW (ref 180–914)

## 2021-11-11 LAB — TROPONIN I (HIGH SENSITIVITY): Troponin I (High Sensitivity): 13 ng/L (ref <18)

## 2021-11-11 MED ORDER — CYANOCOBALAMIN 1000 MCG/ML IJ SOLN
1000.0000 ug | Freq: Once | INTRAMUSCULAR | Status: AC
  Administered 2021-11-11: 1000 ug via INTRAMUSCULAR
  Filled 2021-11-11: qty 1

## 2021-11-11 MED ORDER — APIXABAN 5 MG PO TABS
5.0000 mg | ORAL_TABLET | Freq: Two times a day (BID) | ORAL | Status: DC
  Administered 2021-11-11 – 2021-11-12 (×3): 5 mg via ORAL
  Filled 2021-11-11 (×3): qty 1

## 2021-11-11 MED ORDER — LOPERAMIDE HCL 2 MG PO CAPS: 2.0000 mg | ORAL_CAPSULE | Freq: Four times a day (QID) | ORAL | Status: DC | PRN

## 2021-11-11 MED ORDER — FUROSEMIDE 10 MG/ML IJ SOLN
40.0000 mg | Freq: Two times a day (BID) | INTRAMUSCULAR | Status: DC
  Administered 2021-11-11 – 2021-11-12 (×3): 40 mg via INTRAVENOUS
  Filled 2021-11-11 (×3): qty 4

## 2021-11-11 MED ORDER — PERFLUTREN LIPID MICROSPHERE
1.0000 mL | INTRAVENOUS | Status: AC | PRN
  Administered 2021-11-11: 2 mL via INTRAVENOUS
  Filled 2021-11-11: qty 10

## 2021-11-11 MED ORDER — METOPROLOL TARTRATE 50 MG PO TABS
50.0000 mg | ORAL_TABLET | Freq: Two times a day (BID) | ORAL | Status: DC
  Administered 2021-11-11 – 2021-11-12 (×3): 50 mg via ORAL
  Filled 2021-11-11 (×3): qty 1

## 2021-11-11 NOTE — ED Notes (Signed)
Stool sample walked to lab

## 2021-11-11 NOTE — ED Notes (Signed)
Patient transported to CT 

## 2021-11-11 NOTE — Progress Notes (Addendum)
PROGRESS NOTE    [name]  EZM:[id number] DOB: [birth year] DOA: 11/10/2021 PCP: Lauro Regulus, MD  Outpatient Specialists: cardiology    Brief Narrative:  From admission h and p: [name] is a 88 y.o. female with medical history significant for hypertension, dementia, non-insulin-dependent diabetes mellitus, depression, GERD, anxiety, history of left femur fracture status postrepair in June 2022, who presents emergency department from [facility] house for chief concerns of weakness and increased swelling over the last 2 to 3 weeks.    Assessment & Plan:   Principal Problem:   Acute exacerbation of CHF (congestive heart failure) (HCC) Active Problems:   GERD (gastroesophageal reflux disease)   HLD (hyperlipidemia)   Depression   Anxiety   Diabetes (HCC)   Hypoxia   Acute on chronic respiratory failure with hypoxia (HCC)   HTN (hypertension)   Atrial fibrillation with rapid ventricular response (HCC)   # Acute encephalopathy Likely delirium in setting of dementia, acute illness. No sig lab abnormalities - will f/u CT head - swallow eval  # Acute congestive heart failure History diastolic dysfunction. New a fib consequence or contributory. Troponin normal. CXR with suggestive findings. - continue lasix 40 IV bid for home 40 po qd - f/u tte - repeat trop - strict I/os  # b12 deficiency - 1000 mcg b12 IM x1 - f/u folate  # Lower extremity edema Likely 2/2 chf, shows signs chronic venous insufficiency. Do not think infected - stop abx - f/u pvl to eval for dvt  # New a-fib  Cause or consequence of chf exacerbation. Rvr overnight resolved - cont home metop tartrate increase to 50 bid - start eliquis - f/u tsh - maintain tele  # HTN Here normotensive - hold home amlod - hold asa in setting of starting eliquis  # Dementia with behavioral disturbance - cont home depakote, lorazepam prn, melatonin, seroquel, zoloft  # T2DM - hold home  metformin - SSI  # Chronic hypoxic respiratory failure On 3 L at home, same here - cont o2    DVT prophylaxis: eliquis Code Status: dnr Family Communication: son updated telephonically 1/4  Level of care: Progressive Status is: Observation  The patient will require care spanning > 2 midnights and should be moved to inpatient because: severity of illness       Consultants:  none  Procedures: none  Antimicrobials:  S/p vanc/cefepime/ceftriaxone    Subjective: No complaints  Objective: Vitals:   11/11/21 0430 11/11/21 0500 11/11/21 0700 11/11/21 0730  BP: (!) 110/56 111/61 (!) 133/56 136/61  Pulse: 88 82 97 91  Resp: (!) 27 (!) 29 (!) 25 (!) 22  Temp:      TempSrc:      SpO2: 100% 100% 100% 97%  Weight:      Height:        Intake/Output Summary (Last 24 hours) at 11/11/2021 0841 Last data filed at 11/10/2021 2125 Gross per 24 hour  Intake --  Output 900 ml  Net -900 ml   Filed Weights   11/10/21 1206  Weight: 90.7 kg    Examination:  General exam: Appears calm and comfortable Respiratory system: Clear to auscultation save for rales at bases Cardiovascular system: S1 & S2 heard, irreg irreg, soft systolic murmur Gastrointestinal system: Abdomen is nondistended, soft and nontender. No organomegaly or masses felt. Normal bowel sounds heard. Central nervous system: asleep but rouses, moves all 4 extremities, responds to simple commands Extremities: erythema and edema to upper calves Skin:  erythema and skin thickening b/l LEs Psychiatry: calm, confused    Data Reviewed: I have personally reviewed following labs and imaging studies  CBC: Recent Labs  Lab 11/10/21 1207  WBC 5.8  HGB 10.4*  HCT 36.0  MCV 90.2  PLT 123XX123   Basic Metabolic Panel: Recent Labs  Lab 11/10/21 1207  NA 139  K 4.6  CL 99  CO2 36*  GLUCOSE 134*  BUN 21  CREATININE 0.89  CALCIUM 8.7*   GFR: Estimated Creatinine Clearance: 49.6 mL/min (by C-G formula based on  SCr of 0.89 mg/dL). Liver Function Tests: No results for input(s): AST, ALT, ALKPHOS, BILITOT, PROT, ALBUMIN in the last 168 hours. No results for input(s): LIPASE, AMYLASE in the last 168 hours. No results for input(s): AMMONIA in the last 168 hours. Coagulation Profile: No results for input(s): INR, PROTIME in the last 168 hours. Cardiac Enzymes: No results for input(s): CKTOTAL, CKMB, CKMBINDEX, TROPONINI in the last 168 hours. BNP (last 3 results) No results for input(s): PROBNP in the last 8760 hours. HbA1C: No results for input(s): HGBA1C in the last 72 hours. CBG: No results for input(s): GLUCAP in the last 168 hours. Lipid Profile: No results for input(s): CHOL, HDL, LDLCALC, TRIG, CHOLHDL, LDLDIRECT in the last 72 hours. Thyroid Function Tests: No results for input(s): TSH, T4TOTAL, FREET4, T3FREE, THYROIDAB in the last 72 hours. Anemia Panel: No results for input(s): VITAMINB12, FOLATE, FERRITIN, TIBC, IRON, RETICCTPCT in the last 72 hours. Urine analysis:    Component Value Date/Time   COLORURINE YELLOW (A) 10/28/2020 1632   APPEARANCEUR HAZY (A) 10/28/2020 1632   APPEARANCEUR Clear 05/10/2017 0932   LABSPEC 1.015 10/28/2020 1632   LABSPEC 1.038 06/17/2014 1357   PHURINE 5.0 10/28/2020 1632   GLUCOSEU NEGATIVE 10/28/2020 1632   GLUCOSEU Negative 06/17/2014 1357   HGBUR NEGATIVE 10/28/2020 1632   BILIRUBINUR NEGATIVE 10/28/2020 1632   BILIRUBINUR Negative 05/10/2017 0932   BILIRUBINUR Negative 06/17/2014 1357   KETONESUR NEGATIVE 10/28/2020 1632   PROTEINUR 100 (A) 10/28/2020 1632   NITRITE NEGATIVE 10/28/2020 1632   LEUKOCYTESUR SMALL (A) 10/28/2020 1632   LEUKOCYTESUR Negative 06/17/2014 1357   Sepsis Labs: @LABRCNTIP (procalcitonin:4,lacticidven:4)  ) Recent Results (from the past 240 hour(s))  Resp Panel by RT-PCR (Flu A&B, Covid) Nasopharyngeal Swab     Status: None   Collection Time: 11/10/21  3:45 PM   Specimen: Nasopharyngeal Swab; Nasopharyngeal(NP)  swabs in vial transport medium  Result Value Ref Range Status   SARS Coronavirus 2 by RT PCR NEGATIVE NEGATIVE Final    Comment: (NOTE) SARS-CoV-2 target nucleic acids are NOT DETECTED.  The SARS-CoV-2 RNA is generally detectable in upper respiratory specimens during the acute phase of infection. The lowest concentration of SARS-CoV-2 viral copies this assay can detect is 138 copies/mL. A negative result does not preclude SARS-Cov-2 infection and should not be used as the sole basis for treatment or other patient management decisions. A negative result may occur with  improper specimen collection/handling, submission of specimen other than nasopharyngeal swab, presence of viral mutation(s) within the areas targeted by this assay, and inadequate number of viral copies(<138 copies/mL). A negative result must be combined with clinical observations, patient history, and epidemiological information. The expected result is Negative.  Fact Sheet for Patients:  EntrepreneurPulse.com.au  Fact Sheet for Healthcare Providers:  IncredibleEmployment.be  This test is no t yet approved or cleared by the [place] FDA and  has been authorized for detection and/or diagnosis of SARS-CoV-2 by FDA under an  Emergency Use Authorization (EUA). This EUA will remain  in effect (meaning this test can be used) for the duration of the COVID-19 declaration under Section 564(b)(1) of the Act, 21 U.S.C.section 360bbb-3(b)(1), unless the authorization is terminated  or revoked sooner.       Influenza A by PCR NEGATIVE NEGATIVE Final   Influenza B by PCR NEGATIVE NEGATIVE Final    Comment: (NOTE) The Xpert Xpress SARS-CoV-2/FLU/RSV plus assay is intended as an aid in the diagnosis of influenza from Nasopharyngeal swab specimens and should not be used as a sole basis for treatment. Nasal washings and aspirates are unacceptable for Xpert Xpress  SARS-CoV-2/FLU/RSV testing.  Fact Sheet for Patients: EntrepreneurPulse.com.au  Fact Sheet for Healthcare Providers: IncredibleEmployment.be  This test is not yet approved or cleared by the [place] FDA and has been authorized for detection and/or diagnosis of SARS-CoV-2 by FDA under an Emergency Use Authorization (EUA). This EUA will remain in effect (meaning this test can be used) for the duration of the COVID-19 declaration under Section 564(b)(1) of the Act, 21 U.S.C. section 360bbb-3(b)(1), unless the authorization is terminated or revoked.  Performed at [facility], [address], [place], [place] [address]          Radiology Studies: DG Chest 2 View  Result Date: 11/10/2021 CLINICAL DATA:  Sob.  Hx of CHF, COPD, dementia, and diabetes. EXAM: CHEST - 2 VIEW.  Patient is rotated. COMPARISON:  Chest x-ray 11/10/2021. chest x-ray 10/28/2020, CT chest 06/06/2019 FINDINGS: Limited evaluation due to patient rotation. The heart and mediastinal contours are unchanged given patient rotation. Questionable right lower lobe airspace opacity. Interval increase in interstitial markings. Likely small left pleural effusion. No pneumothorax. No acute osseous abnormality. IMPRESSION: 1. Cardiomegaly with pulmonary edema and likely small left pleural effusion. Superimposed infection/inflammation not excluded. Followup PA and lateral chest X-ray is recommended in 3-4 weeks following therapy to ensure resolution and exclude underlying malignancy. 2. Question developing right lower lobe airspace opacity. Limited evaluation due to patient rotation Electronically Signed   By: Iven Finn M.D.   On: 11/10/2021 18:23   DG Ankle Complete Left  Result Date: 11/10/2021 CLINICAL DATA:  Fall 6 weeks ago, swelling EXAM: LEFT ANKLE COMPLETE - 3+ VIEW COMPARISON:  Left foot radiographs 05/01/2015 FINDINGS: The bones are diffusely demineralized. There is no  acute fracture or dislocation. The ankle mortise is intact. There is mild inferior calcaneal spurring. The soft tissues are unremarkable. IMPRESSION: 1. No acute fracture or dislocation. 2. Diffuse bony demineralization. Electronically Signed   By: Valetta Mole M.D.   On: 11/10/2021 12:41   DG Chest Portable 1 View  Result Date: 11/10/2021 CLINICAL DATA:  sob EXAM: PORTABLE CHEST 1 VIEW COMPARISON:  None chest x-ray 04/29/2021 FINDINGS: Markedly rotated radiograph. Unable to evaluate due to overlying soft tissues and osseous structures. No significant pneumothorax. Enlarged cardiac silhouette. IMPRESSION: Markedly rotated radiograph. Unable to evaluate due to overlying soft tissues and osseous structures. Recommend repeat PA and lateral view of the chest. Electronically Signed   By: Iven Finn M.D.   On: 11/10/2021 16:11        Scheduled Meds:  divalproex  250 mg Oral QHS   feeding supplement  237 mL Oral BID BM   furosemide  40 mg Intravenous BID   metoprolol tartrate  50 mg Oral BID   sertraline  50 mg Oral Daily   sodium chloride flush  3 mL Intravenous Q12H   Continuous Infusions:  sodium chloride  LOS: 0 days    Time spent: 53 min    Desma Maxim, MD [facility]   If 7PM-7AM, please contact night-coverage [email or web address] Password [facility] 11/11/2021, 8:41 AM

## 2021-11-11 NOTE — ED Notes (Signed)
Pt was able to take morning medication w/o incident.  Pt easily took medications w/ Ensure.

## 2021-11-11 NOTE — Evaluation (Signed)
Clinical/Bedside Swallow Evaluation Patient Details  Name: [name] MRN: [id number] Date of Birth: 08-01-1932  Today's Date: 11/11/2021 Time: SLP Start Time (ACUTE ONLY): 1325 SLP Stop Time (ACUTE ONLY): 1425 SLP Time Calculation (min) (ACUTE ONLY): 60 min  Past Medical History:  Past Medical History:  Diagnosis Date   Anxiety    CHF (congestive heart failure) (HCC)    COPD (chronic obstructive pulmonary disease) (HCC)    Dementia (HCC)    Depression    Diabetes mellitus without complication (HCC)    GERD (gastroesophageal reflux disease)    Hip fracture (HCC)    Hyperlipemia    Hypertension    Neuropathy    Vertigo    Past Surgical History:  Past Surgical History:  Procedure Laterality Date   ABDOMINAL HYSTERECTOMY     APPENDECTOMY     BACK SURGERY     tumor removal bengin   CHOLECYSTECTOMY     INTRAMEDULLARY (IM) NAIL INTERTROCHANTERIC Left 04/30/2021   Procedure: INTRAMEDULLARY (IM) NAIL INTERTROCHANTRIC;  Surgeon: Signa Kell, MD;  Location: [facility] ORS;  Service: Orthopedics;  Laterality: Left;   HPI:  Pt is a 88 y.o. female with medical history significant for hypertension, Dementia, non-insulin-dependent diabetes mellitus, depression, GERD, anxiety, history of left femur fracture status postrepair in June 2022, afib w/ RVR, Acute on chronic respiratory failure with hypoxia, who presents emergency department from [place] house for chief concerns of weakness and increased swelling over the last 2 to 3 weeks.  CXR: Cardiomegaly with pulmonary edema and likely small left pleural  effusion. Superimposed infection/inflammation not excluded. Followup  PA and lateral chest X-ray is recommended in 3-4 weeks following  therapy to ensure resolution and exclude underlying malignancy.    Assessment / Plan / Recommendation  Clinical Impression  Pt appears to present w/ grossly adequate oropharyngeal phase swallow function in light of declined Cognitive status; Baseline Dementia.  Any Cognitive decline can impact her overall awareness/timing of swallow and safety during po tasks which increases risk for aspiration, choking. Pt's risk for aspiration is present but significantly reduced when following general aspiration precautions and using a modified diet consistency w/ minced and pureed foods d/t Edentulous status Baseline. She required min-mod verbal/visual cues for follow through during po tasks; often talkative.      Pt supported and given cues on positioning for full upright sitting for safer oral intake. She then consumed trials of thin liquids VIA CUP, purees, and minced solids w/ no overt clinical s/s of aspiration noted; clear(low) vocal quality b/t trials. Respiratory effort remained calm during/post trials. O2 sats remained 97%. Oral phase appeared [facility] for bolus management, A-P transfer, and timely swallowing except for min increased time for full mastication/mashing/clearing of minced, softened solids -- applesauce was used to soften the cookie. Oral clearing achieved b/t trials. OM exam was [facility] for oral clearing; lingual/labial movements. No unilateral weakness. Pt has a baseline of GERD. Any Reflux behavior and/or Esophageal dysmotility can impact swallowing.     Recommend a more minced foods diet w/ moistened foods and added purees w/ thin liquids VIA CUP; general Reflux and aspiration precautions. Recommend positioning and less talking/distraction during meals to allow pt to focus on oral intake. Recommend support at meals w/ feeding d/t generalized weakness w/ illness. Recommend Pills Whole in Puree for safer swallowing. These precautions were discussed w/ pt/NSG; posted in chart for NSG. No further skilled ST services indicated at this time. NSG to reconsult if any new needs while admitted. NSG updated. SLP  Visit Diagnosis: Dysphagia, unspecified (R13.10) (baseline Dementia)    Aspiration Risk   (reduced following precautions)    Diet Recommendation   Minced foods  diet (dysphagia level 2) w/ moistened foods and added purees; Thin liquids VIA CUP; general Reflux and aspiration precautions. Recommend positioning and less talking/distraction during meals to allow pt to focus on oral intake. Recommend support at meals w/ feeding d/t generalized weakness w/ illness. Positioning and setup at meals.   Medication Administration: Whole meds with puree (for safer swallowing)    Other  Recommendations Recommended Consults:  (Dietician f/u) Oral Care Recommendations: Oral care BID;Oral care before and after PO;Staff/trained caregiver to provide oral care Other Recommendations:  (n/a)    Recommendations for follow up therapy are one component of a multi-disciplinary discharge planning process, led by the attending physician.  Recommendations may be updated based on patient status, additional functional criteria and insurance authorization.  Follow up Recommendations No SLP follow up      Assistance Recommended at Discharge Set up Supervision/Assistance  Functional Status Assessment Patient has had a recent decline in their functional status and/or demonstrates limited ability to make significant improvements in function in a reasonable and predictable amount of time  Frequency and Duration  (n/a)   (n/a)       Prognosis Prognosis for Safe Diet Advancement: Fair (-Good) Barriers to Reach Goals: Cognitive deficits;Time post onset;Severity of deficits      Swallow Study   General Date of Onset: 11/10/21 HPI: Pt is a 88 y.o. female with medical history significant for hypertension, Dementia, non-insulin-dependent diabetes mellitus, depression, GERD, anxiety, history of left femur fracture status postrepair in June 2022, afib w/ RVR, Acute on chronic respiratory failure with hypoxia, who presents emergency department from [place] house for chief concerns of weakness and increased swelling over the last 2 to 3 weeks.  CXR: Cardiomegaly with pulmonary edema and  likely small left pleural  effusion. Superimposed infection/inflammation not excluded. Followup  PA and lateral chest X-ray is recommended in 3-4 weeks following  therapy to ensure resolution and exclude underlying malignancy. Type of Study: Bedside Swallow Evaluation Previous Swallow Assessment: 2019 Diet Prior to this Study: Dysphagia 1 (puree);Thin liquids Temperature Spikes Noted: No (wbc 5.8) Respiratory Status: Nasal cannula (3L) History of Recent Intubation: No Behavior/Cognition: Alert;Cooperative;Pleasant mood;Confused;Distractible;Requires cueing (pleasant and talkative) Oral Cavity Assessment: Within Functional Limits Oral Care Completed by SLP: Yes Oral Cavity - Dentition: Edentulous (does not wear dentures) Vision: Functional for self-feeding (shaky LUE but adequate) Self-Feeding Abilities: Able to feed self;Needs assist;Needs set up Patient Positioning: Upright in bed (positioning support - full) Baseline Vocal Quality: Normal Volitional Cough: Strong Volitional Swallow: Able to elicit    Oral/Motor/Sensory Function Overall Oral Motor/Sensory Function: Within functional limits   Ice Chips Ice chips: Within functional limits Presentation: Spoon (fed; 2 trials)   Thin Liquid Thin Liquid: Within functional limits Presentation: Cup;Self Fed (~6 ozs) Other Comments: water, tea, juice    Nectar Thick Nectar Thick Liquid: Not tested   Honey Thick Honey Thick Liquid: Not tested   Puree Puree: Within functional limits Presentation: Spoon;Self Fed (supported; 4 ozs)   Solid     Solid: Within functional limits (but Edentulous -- time needed for gumming/mashing trials) Presentation: Spoon (fed; 6 trials)        Jerilynn Som, MS, [employer] [facility] [phone] [place] 11/11/2021,5:03 PM

## 2021-11-11 NOTE — Progress Notes (Addendum)
Received verbal message from cross coverage provider that patient developed A. fib with RVR  # New A. Fib With RVR - Presumed secondary to heart failure exacerbation versus pneumonia - Complete echo ordered, check TSH - Cross coverage provider ordered metoprolol tartrate injection 5 mg IV once -Metoprolol 5 mg IV every 2 hours as needed for heart rate greater than 120, 3 doses ordered  Dr. Tobie Poet

## 2021-11-11 NOTE — Progress Notes (Signed)
*  PRELIMINARY RESULTS* Echocardiogram 2D Echocardiogram has been performed.  [name] 11/11/2021, 1:51 PM

## 2021-11-11 NOTE — Progress Notes (Signed)
ANTICOAGULATION CONSULT NOTE - Initial Consult  Pharmacy Consult for apixaban Indication: atrial fibrillation  Allergies  Allergen Reactions   Ampicillin     Zpac   Sulfa Antibiotics     Patient Measurements: Height: 5\' 5"  (165.1 cm) Weight: 90.7 kg (200 lb) IBW/kg (Calculated) : 57 Heparin Dosing Weight:    Vital Signs: Temp: 97.9 F (36.6 C) (01/03 2100) Temp Source: Axillary (01/03 2100) BP: 137/85 (01/04 0830) Pulse Rate: 87 (01/04 0830)  Labs: Recent Labs    11/10/21 1207 11/10/21 1550  HGB 10.4*  --   HCT 36.0  --   PLT 208  --   CREATININE 0.89  --   TROPONINIHS  --  14    Estimated Creatinine Clearance: 49.6 mL/min (by C-G formula based on SCr of 0.89 mg/dL).   Medical History: Past Medical History:  Diagnosis Date   Anxiety    CHF (congestive heart failure) (HCC)    COPD (chronic obstructive pulmonary disease) (HCC)    Dementia (HCC)    Depression    Diabetes mellitus without complication (HCC)    GERD (gastroesophageal reflux disease)    Hip fracture (HCC)    Hyperlipemia    Hypertension    Neuropathy    Vertigo     Medications:  Scheduled:   apixaban  5 mg Oral BID   divalproex  250 mg Oral QHS   feeding supplement  237 mL Oral BID BM   furosemide  40 mg Intravenous BID   metoprolol tartrate  50 mg Oral BID   sertraline  50 mg Oral Daily   sodium chloride flush  3 mL Intravenous Q12H   Infusions:   sodium chloride      Assessment: 88 yo F to start apixaban for new Afib.  Hgb 10.4   plt 208 Received lovenox 45 mg 1/4 at 0129 (prophylactic dose)  Goal of Therapy:  Monitor platelets by anticoagulation protocol: Yes   Plan:  Will order apixaban 5 mg po BID F/u CBC/SCR pe rprotocol  Wakeelah Solan A 11/11/2021,8:57 AM

## 2021-11-12 DIAGNOSIS — I5033 Acute on chronic diastolic (congestive) heart failure: Secondary | ICD-10-CM

## 2021-11-12 LAB — CBC WITH DIFFERENTIAL/PLATELET
Abs Immature Granulocytes: 0.03 10*3/uL (ref 0.00–0.07)
Basophils Absolute: 0.1 10*3/uL (ref 0.0–0.1)
Basophils Relative: 1 %
Eosinophils Absolute: 0.1 10*3/uL (ref 0.0–0.5)
Eosinophils Relative: 1 %
HCT: 37 % (ref 36.0–46.0)
Hemoglobin: 10.8 g/dL — ABNORMAL LOW (ref 12.0–15.0)
Immature Granulocytes: 1 %
Lymphocytes Relative: 13 %
Lymphs Abs: 0.7 10*3/uL (ref 0.7–4.0)
MCH: 25.8 pg — ABNORMAL LOW (ref 26.0–34.0)
MCHC: 29.2 g/dL — ABNORMAL LOW (ref 30.0–36.0)
MCV: 88.5 fL (ref 80.0–100.0)
Monocytes Absolute: 0.4 10*3/uL (ref 0.1–1.0)
Monocytes Relative: 8 %
Neutro Abs: 4.4 10*3/uL (ref 1.7–7.7)
Neutrophils Relative %: 76 %
Platelets: 192 10*3/uL (ref 150–400)
RBC: 4.18 MIL/uL (ref 3.87–5.11)
RDW: 14.1 % (ref 11.5–15.5)
WBC: 5.7 10*3/uL (ref 4.0–10.5)
nRBC: 0 % (ref 0.0–0.2)

## 2021-11-12 LAB — BASIC METABOLIC PANEL
Anion gap: 9 (ref 5–15)
BUN: 20 mg/dL (ref 8–23)
CO2: 36 mmol/L — ABNORMAL HIGH (ref 22–32)
Calcium: 8.9 mg/dL (ref 8.9–10.3)
Chloride: 93 mmol/L — ABNORMAL LOW (ref 98–111)
Creatinine, Ser: 0.72 mg/dL (ref 0.44–1.00)
GFR, Estimated: >60 mL/min (ref >60)
Glucose, Bld: 126 mg/dL — ABNORMAL HIGH (ref 70–99)
Potassium: 4.2 mmol/L (ref 3.5–5.1)
Sodium: 138 mmol/L (ref 135–145)

## 2021-11-12 MED ORDER — APIXABAN 5 MG PO TABS: 5.0000 mg | ORAL_TABLET | Freq: Two times a day (BID) | ORAL | 1 refills | Status: AC

## 2021-11-12 MED ORDER — METOPROLOL TARTRATE 25 MG PO TABS: 50.0000 mg | ORAL_TABLET | Freq: Two times a day (BID) | ORAL | 0 refills | Status: AC

## 2021-11-12 NOTE — ED Notes (Signed)
Pt given cola and apple sauce

## 2021-11-12 NOTE — TOC Transition Note (Signed)
Transition of Care [facility]) - CM/SW Discharge Note   Patient Details  Name: [name] MRN: [id number] Date of Birth: 12-15-1931  Transition of Care [facility]) CM/SW Contact:  Allayne Butcher, RN Phone Number: 11/12/2021, 11:02 AM   Clinical Narrative:    Patient medically cleared for discharge back to [facility] today.  Patient is going to room 52.  Bedside RN will call report to [phone].  ED secretary to set up EMS transport.  Patient's son [name] notified that patient is discharging from hospital today.    Final next level of care: Skilled Nursing Facility Barriers to Discharge: Barriers Resolved   Patient Goals and CMS Choice Patient states their goals for this hospitalization and ongoing recovery are:: Patient agrees to going back to [facility] CMS Medicare.gov Compare Post Acute Care list provided to:: Patient Choice offered to / list presented to : Patient, [facility] POA / Guardian  Discharge Placement              Patient chooses bed at: [facility] Patient to be transferred to facility by: [facility] Name of family member notified: Anniebelle Devore- son Patient and family notified of of transfer: 11/12/21  Discharge Plan and Services   Discharge Planning Services: CM Consult Post Acute Care Choice: Resumption of Svcs/PTA Provider, Skilled Nursing Facility          DME Arranged: N/A DME Agency: NA       HH Arranged: NA HH Agency: NA        Social Determinants of Health (SDOH) Interventions     Readmission Risk Interventions Readmission Risk Prevention Plan 11/12/2021 11/03/2020 05/28/2019  Transportation Screening Complete Complete Complete  PCP or Specialist Appt within 3-5 Days Complete (No Data) -  HRI or Home Care Consult Not Complete Complete -  HRI or Home Care Consult comments Lives at [facility] - -  Social Work Consult for Recovery Care Planning/Counseling Complete - -  Palliative Care Screening Not Applicable Not  Applicable -  Medication Review Oceanographer) Complete Complete Complete  PCP or Specialist appointment within 3-5 days of discharge - - (No Data)  HRI or Home Care Consult - - Complete  Palliative Care Screening - - Not Applicable  Skilled Nursing Facility - - Not Applicable  Some recent data might be hidden

## 2021-11-12 NOTE — ED Notes (Signed)
[facility]  CALLED FOR  TRANSPORT  TO  [facility] 

## 2021-11-12 NOTE — Progress Notes (Signed)
PT Cancellation Note  Patient Details Name: Minori Flees MRN: [id number] DOB: 04/10/1932   Cancelled Treatment:    Reason Eval/Treat Not Completed:  (Per chart review, patient from [facility] LTC; planning to return.  Report and EMS already called. No acute needs prior to imminent discharge per care team.  Will continue to follow and initiate should patient remain in house.\)   Ronell Duffus H. Owens Shark, PT, DPT, NCS 11/12/21, 11:36 AM [phone]

## 2021-11-12 NOTE — ED Notes (Addendum)
This RN called [place] [phone] to attempt report again for this pt returning to them. Report given to [facility]

## 2021-11-12 NOTE — Discharge Summary (Signed)
Khadija Thier ZOX:[id number] DOB: 1932-10-30 DOA: 11/10/2021  PCP: Lauro Regulus, MD  Admit date: 11/10/2021 Discharge date: 11/12/2021  Time spent: 35 minutes  Recommendations for Outpatient Follow-up:  Monitor volume status Needs close f/u with cardiology (Dr. Gwen Pounds) for new a-fib and CHF exacerbation. Has also been referred to our heart failure clinic. Diagnosed with b12 deficiency, given 1 dose of IM b12 1000 mcg, advise continuing weekly for 1 month    Discharge Diagnoses:  Principal Problem:   Acute exacerbation of CHF (congestive heart failure) (HCC) Active Problems:   GERD (gastroesophageal reflux disease)   HLD (hyperlipidemia)   Depression   Anxiety   Diabetes (HCC)   Hypoxia   Acute on chronic respiratory failure with hypoxia (HCC)   HTN (hypertension)   Atrial fibrillation with rapid ventricular response (HCC)   Discharge Condition: stable  Diet recommendation: heart healthy, dysphagia diet  Filed Weights   11/10/21 1206  Weight: 90.7 kg    History of present illness:  Ashawna Sheily Lineman is a 88 y.o. female with medical history significant for hypertension, dementia, non-insulin-dependent diabetes mellitus, depression, GERD, anxiety, history of left femur fracture status postrepair in June 2022, who presents emergency department from [facility] house for chief concerns of weakness and increased swelling over the last 2 to 3 weeks.   At bedside patient states she does not know her name.  She is not able to tell me her age of her current location.  She does move her extremities of her own volition.  She does not follow my request.  Hospital Course:  # Acute encephalopathy Likely delirium in setting of dementia, acute illness. No sig lab abnormalities. CT head negative. Resolved  # Acute diastolic congestive heart failure History diastolic dysfunction. New a fib consequence or contributory. Troponin normal. CXR with suggestive findings. TTE with normal EF, no  sig regurgitation. LE edema resolved with IV lasix. PVL negative for DVT. - resume home lasix  # New a-fib  Cause or consequence of chf exacerbation. Rvr resolved - cont home metop tartrate but increase to 50 bid - start eliquis - outpatient cardiology f/u Gwen Pounds)   # b12 deficiency - 1000 mcg b12 IM x1, advise continue weekly for one month - f/u folate   # HTN Here normotensive - hold home amlod - hold asa in setting of starting eliquis   # Dementia with behavioral disturbance - cont home depakote, lorazepam prn, melatonin, seroquel, zoloft   # T2DM Here glucose controlled   # Chronic hypoxic respiratory failure On 3 L at home, same here  Procedures: none   Consultations: none  Discharge Exam: Vitals:   11/12/21 0830 11/12/21 0900  BP: (!) 143/76 (!) 154/69  Pulse: 84 87  Resp: (!) 26 (!) 22  Temp:    SpO2: 96% 95%    General exam: Appears calm and comfortable Respiratory system: Clear to auscultation save for rales at bases Cardiovascular system: S1 & S2 heard, irreg irreg, soft systolic murmur Gastrointestinal system: Abdomen is nondistended, soft and nontender. No organomegaly or masses felt. Normal bowel sounds heard. Central nervous system: awake and alert, moves all 4 ext Extremities: erythema and edema to upper calves Skin:  erythema and skin thickening b/l LEs Psychiatry: calm  Discharge Instructions   Discharge Instructions     AMB referral to CHF clinic   Complete by: As directed    Diet - low sodium heart healthy   Complete by: As directed    Increase activity slowly  Complete by: As directed       Allergies as of 11/12/2021       Reactions   Ampicillin    Zpac   Sulfa Antibiotics         Medication List     STOP taking these medications    amLODipine 5 MG tablet Commonly known as: NORVASC   aspirin EC 81 MG tablet   dicyclomine 20 MG tablet Commonly known as: BENTYL   enoxaparin 40 MG/0.4ML injection Commonly  known as: LOVENOX   esomeprazole 20 MG capsule Commonly known as: NEXIUM   loratadine 10 MG tablet Commonly known as: CLARITIN   LORazepam 0.5 MG tablet Commonly known as: ATIVAN   metFORMIN 500 MG 24 hr tablet Commonly known as: GLUCOPHAGE-XR   oxyCODONE 5 MG immediate release tablet Commonly known as: Oxy IR/ROXICODONE   QUEtiapine 25 MG tablet Commonly known as: SEROQUEL   Vitamin D 50 MCG (2000 UT) tablet       TAKE these medications    acetaminophen 500 MG tablet Commonly known as: TYLENOL Take 1,000 mg by mouth 3 (three) times daily.   acidophilus Caps capsule Take 1 capsule by mouth 2 (two) times daily.   alum & mag hydroxide-simeth 200-200-20 MG/5ML suspension Commonly known as: MAALOX/MYLANTA Take 30 mLs by mouth as needed for indigestion or heartburn.   apixaban 5 MG Tabs tablet Commonly known as: ELIQUIS Take 1 tablet (5 mg total) by mouth 2 (two) times daily.   azelastine 0.1 % nasal spray Commonly known as: ASTELIN Place 1 spray into both nostrils 2 (two) times daily. Use in each nostril as directed   bismuth subsalicylate 262 MG/15ML suspension Commonly known as: PEPTO BISMOL Take 262 mg by mouth every 2 (two) hours as needed for indigestion (up to 6 doses in 24 hr).   busPIRone 7.5 MG tablet Commonly known as: BUSPAR Take 7.5 mg by mouth 3 (three) times daily.   calcium carbonate 500 MG chewable tablet Commonly known as: TUMS - dosed in mg elemental calcium Chew 2 tablets (400 mg of elemental calcium total) by mouth 3 (three) times daily as needed for indigestion or heartburn.   diphenoxylate-atropine 2.5-0.025 MG tablet Commonly known as: LOMOTIL Take 1 tablet by mouth 2 (two) times daily as needed for diarrhea or loose stools.   divalproex 250 MG 24 hr tablet Commonly known as: DEPAKOTE ER Take 250 mg by mouth at bedtime.   feeding supplement Liqd Take 237 mLs by mouth 2 (two) times daily between meals.   fluticasone 50 MCG/ACT  nasal spray Commonly known as: FLONASE Place 1 spray into both nostrils daily.   furosemide 40 MG tablet Commonly known as: LASIX Take 1 tablet (40 mg total) by mouth daily.   ipratropium-albuterol 0.5-2.5 (3) MG/3ML Soln Commonly known as: DUONEB Take 3 mLs by nebulization 2 (two) times daily.   lisinopril 5 MG tablet Commonly known as: ZESTRIL Take 5 mg by mouth daily.   loperamide 2 MG tablet Commonly known as: IMODIUM A-D Take 2 mg by mouth as needed for diarrhea or loose stools (up to 4 doses in 12 hrs.).   magnesium hydroxide 400 MG/5ML suspension Commonly known as: MILK OF MAGNESIA Take 30 mLs by mouth daily as needed for mild constipation.   melatonin 3 MG Tabs tablet Take 3 mg by mouth at bedtime as needed.   metoprolol tartrate 25 MG tablet Commonly known as: LOPRESSOR Take 2 tablets (50 mg total) by mouth 2 (two) times daily. What  changed: how much to take   multivitamin with minerals Tabs tablet Take 1 tablet by mouth daily.   nystatin powder Commonly known as: MYCOSTATIN/NYSTOP Apply 1 g topically 2 (two) times daily as needed (to affected area(s)).   olopatadine 0.1 % ophthalmic solution Commonly known as: PATANOL Place 1 drop into both eyes daily.   ondansetron 4 MG tablet Commonly known as: ZOFRAN Take 4 mg by mouth every 8 (eight) hours as needed for nausea or vomiting.   psyllium 58.6 % powder Commonly known as: METAMUCIL Take 1 packet by mouth 2 (two) times a day.   ROBITUSSIN CF PO Take 10 mLs by mouth every 6 (six) hours as needed (cough).   senna-docusate 8.6-50 MG tablet Commonly known as: Senokot-S Take 1 tablet by mouth 2 (two) times daily.   sertraline 50 MG tablet Commonly known as: ZOLOFT Take 50 mg by mouth daily.   sodium phosphate Pediatric 3.5-9.5 GM/59ML enema Place 1 enema rectally once as needed for severe constipation.   traMADol 50 MG tablet Commonly known as: ULTRAM Take 1 tablet (50 mg total) by mouth every 6  (six) hours as needed for moderate pain.   traZODone 50 MG tablet Commonly known as: DESYREL Take 25 mg by mouth at bedtime.       Allergies  Allergen Reactions   Ampicillin     Zpac   Sulfa Antibiotics     Contact information for follow-up providers     Lauro Regulus, MD Follow up.   Specialty: Internal Medicine Contact information: [address] [facility] [place] [address] [phone]         Lamar Blinks, MD. Schedule an appointment as soon as possible for a visit.   Specialty: Cardiology Contact information: [address] [facility] [place] [address] [phone]              Contact information for after-discharge care     Destination     [facility] Preferred SNF .   Service: Skilled Nursing Contact information: [address] [place] [address] [phone]                      The results of significant diagnostics from this hospitalization (including imaging, microbiology, ancillary and laboratory) are listed below for reference.    Significant Diagnostic Studies: DG Chest 2 View  Result Date: 11/10/2021 CLINICAL DATA:  Sob.  Hx of CHF, COPD, dementia, and diabetes. EXAM: CHEST - 2 VIEW.  Patient is rotated. COMPARISON:  Chest x-ray 11/10/2021. chest x-ray 10/28/2020, CT chest 06/06/2019 FINDINGS: Limited evaluation due to patient rotation. The heart and mediastinal contours are unchanged given patient rotation. Questionable right lower lobe airspace opacity. Interval increase in interstitial markings. Likely small left pleural effusion. No pneumothorax. No acute osseous abnormality. IMPRESSION: 1. Cardiomegaly with pulmonary edema and likely small left pleural effusion. Superimposed infection/inflammation not excluded. Followup PA and lateral chest X-ray is recommended in 3-4 weeks following therapy to ensure resolution and exclude  underlying malignancy. 2. Question developing right lower lobe airspace opacity. Limited evaluation due to patient rotation Electronically Signed   By: Tish Frederickson M.D.   On: 11/10/2021 18:23   DG Ankle Complete Left  Result Date: 11/10/2021 CLINICAL DATA:  Fall 6 weeks ago, swelling EXAM: LEFT ANKLE COMPLETE - 3+ VIEW COMPARISON:  Left foot radiographs 05/01/2015 FINDINGS: The bones are diffusely demineralized. There is no acute fracture or dislocation. The  ankle mortise is intact. There is mild inferior calcaneal spurring. The soft tissues are unremarkable. IMPRESSION: 1. No acute fracture or dislocation. 2. Diffuse bony demineralization. Electronically Signed   By: Lesia Hausen M.D.   On: 11/10/2021 12:41   CT HEAD WO CONTRAST ( )  Result Date: 11/11/2021 CLINICAL DATA:  Delirium, encephalopathy EXAM: CT HEAD WITHOUT CONTRAST TECHNIQUE: Contiguous axial images were obtained from the base of the skull through the vertex without intravenous contrast. COMPARISON:  CT head dated April 29, 2021 FINDINGS: Brain: No evidence of acute infarction, hemorrhage, hydrocephalus, extra-axial collection or mass lesion/mass effect. Cerebral atrophy and chronic microvascular ischemic changes of the white matter, unchanged. Vascular: No hyperdense vessel or unexpected calcification. Skull: Hyperostosis frontalis interna. No acute osseous abnormality. Sinuses/Orbits: No acute finding. Other: None. IMPRESSION: 1. No acute intracranial abnormality. 2. Moderate cerebral volume loss and chronic microvascular ischemic changes of the white matter, unchanged. Electronically Signed   By: Larose Hires D.O.   On: 11/11/2021 10:01   US Venous Img Lower Bilateral (DVT)  Result Date: 11/11/2021 CLINICAL DATA:  88 year old female with lower extremity pain and edema. EXAM: BILATERAL LOWER EXTREMITY VENOUS DOPPLER ULTRASOUND TECHNIQUE: Gray-scale sonography with compression, as well as color and duplex ultrasound, were performed to  evaluate the deep venous system(s) from the level of the common femoral vein through the popliteal and proximal calf veins. COMPARISON:  10/28/2006 FINDINGS: VENOUS Normal compressibility of the bilateral common femoral, superficial femoral, and popliteal veins, as well as the visualized calf veins. Visualized portions of profunda femoral vein and great saphenous vein unremarkable. No filling defects to suggest DVT on grayscale or color Doppler imaging. Doppler waveforms show normal direction of venous flow, normal respiratory plasticity and response to augmentation. OTHER Oval nonvascular 52 x 27 by 10 mm popliteal fossa cyst (image 20). Subcutaneous edema visible at the bilateral calf. Limitations: none IMPRESSION: 1. No evidence of bilateral lower extremity deep venous thrombosis. 2. Right side Baker's cyst, 5 cm. Electronically Signed   By: Odessa Fleming M.D.   On: 11/11/2021 09:59   DG Chest Portable 1 View  Result Date: 11/10/2021 CLINICAL DATA:  sob EXAM: PORTABLE CHEST 1 VIEW COMPARISON:  None chest x-ray 04/29/2021 FINDINGS: Markedly rotated radiograph. Unable to evaluate due to overlying soft tissues and osseous structures. No significant pneumothorax. Enlarged cardiac silhouette. IMPRESSION: Markedly rotated radiograph. Unable to evaluate due to overlying soft tissues and osseous structures. Recommend repeat PA and lateral view of the chest. Electronically Signed   By: Tish Frederickson M.D.   On: 11/10/2021 16:11   ECHOCARDIOGRAM COMPLETE  Result Date: 11/11/2021    ECHOCARDIOGRAM REPORT   Patient Name:   ANNALEISE BURGER Date of Exam: 11/11/2021 Medical Rec #:  [id number]      Height:       65.0 in Accession #:    [id number]     Weight:       200.0 lb Date of Birth:  05/26/1932     BSA:          1.978 m Patient Age:    89 years       BP:           116/62 mmHg Patient Gender: F              HR:           87 bpm. Exam Location:  [facility] Procedure: 2D Echo, Color Doppler, Cardiac Doppler and Intracardiac  Opacification Agent Indications:     R06.00 Dyspnea  History:         Patient has prior history of Echocardiogram examinations. COPD,                  Signs/Symptoms:Dementia; Risk Factors:Hypertension, Diabetes                  and Dyslipidemia.  Sonographer:     Humphrey RollsJoan Heiss Referring Phys:  [id number] AMY N COX Diagnosing Phys: Cristal Deerhristopher End MD  Sonographer Comments: Suboptimal apical window and no subcostal window. Image acquisition challenging due to patient body habitus and Image acquisition challenging due to COPD. IMPRESSIONS  1. Left ventricular ejection fraction, by estimation, is 60 to 65%. The left ventricle has normal function. The left ventricle has no regional wall motion abnormalities. There is mild left ventricular hypertrophy. Left ventricular diastolic parameters are indeterminate.  2. Right ventricular systolic function is mildly reduced. The right ventricular size is mildly enlarged.  3. Left atrial size was mildly dilated.  4. Right atrial size was mildly dilated.  5. The mitral valve is abnormal. No evidence of mitral valve regurgitation. Moderate mitral annular calcification.  6. The aortic valve is tricuspid. There is mild calcification of the aortic valve. There is mild thickening of the aortic valve. Aortic valve regurgitation is not visualized. Aortic valve sclerosis is present, with no evidence of aortic valve stenosis. FINDINGS  Left Ventricle: Left ventricular ejection fraction, by estimation, is 60 to 65%. The left ventricle has normal function. The left ventricle has no regional wall motion abnormalities. Definity contrast agent was given IV to delineate the left ventricular  endocardial borders. The left ventricular internal cavity size was normal in size. There is mild left ventricular hypertrophy. Left ventricular diastolic parameters are indeterminate. Right Ventricle: The right ventricular size is mildly enlarged. No increase in right ventricular wall thickness. Right ventricular  systolic function is mildly reduced. Left Atrium: Left atrial size was mildly dilated. Right Atrium: Right atrial size was mildly dilated. Pericardium: The pericardium was not well visualized. Mitral Valve: The mitral valve is abnormal. Moderate mitral annular calcification. No evidence of mitral valve regurgitation. MV peak gradient, 12.7 mmHg. The mean mitral valve gradient is 4.0 mmHg. Tricuspid Valve: The tricuspid valve is grossly normal. Tricuspid valve regurgitation is mild. Aortic Valve: The aortic valve is tricuspid. There is mild calcification of the aortic valve. There is mild thickening of the aortic valve. Aortic valve regurgitation is not visualized. Aortic valve sclerosis is present, with no evidence of aortic valve stenosis. Aortic valve mean gradient measures 3.0 mmHg. Aortic valve peak gradient measures 4.8 mmHg. Aortic valve area, by VTI measures 1.99 cm. Pulmonic Valve: The pulmonic valve was grossly normal. Pulmonic valve regurgitation is not visualized. No evidence of pulmonic stenosis. Aorta: The aortic root is normal in size and structure. Pulmonary Artery: The pulmonary artery is not well seen. Venous: The inferior vena cava was not well visualized. IAS/Shunts: The interatrial septum was not well visualized.  LEFT VENTRICLE PLAX 2D LVIDd:         3.84 cm   Diastology LVIDs:         2.35 cm   LV e' medial:    3.05 cm/s LV PW:         1.29 cm   LV E/e' medial:  48.7 LV IVS:        1.28 cm   LV e' lateral:   5.66 cm/s LVOT diam:  1.70 cm   LV E/e' lateral: 26.2 LV SV:         37 LV SV Index:   19 LVOT Area:     2.27 cm  RIGHT VENTRICLE RV Basal diam:  4.36 cm LEFT ATRIUM           Index        RIGHT ATRIUM           Index LA diam:      4.60 cm 2.33 cm/m   RA Area:     17.85 cm LA Vol (A4C): 76.3 ml 38.57 ml/m  RA Volume:   53.67 ml  27.13 ml/m  AORTIC VALVE                    PULMONIC VALVE AV Area (Vmax):    1.65 cm     PV Vmax:       0.83 m/s AV Area (Vmean):   1.66 cm     PV  Vmean:      54.600 cm/s AV Area (VTI):     1.99 cm     PV VTI:        0.136 m AV Vmax:           109.00 cm/s  PV Peak grad:  2.8 mmHg AV Vmean:          73.900 cm/s  PV Mean grad:  1.0 mmHg AV VTI:            0.185 m AV Peak Grad:      4.8 mmHg AV Mean Grad:      3.0 mmHg LVOT Vmax:         79.40 cm/s LVOT Vmean:        54.000 cm/s LVOT VTI:          0.162 m LVOT/AV VTI ratio: 0.88  AORTA Ao Root diam: 2.90 cm MITRAL VALVE                TRICUSPID VALVE MV Area (PHT): 4.29 cm     TR Peak grad:   23.6 mmHg MV Area VTI:   1.39 cm     TR Vmax:        243.00 cm/s MV Peak grad:  12.7 mmHg MV Mean grad:  4.0 mmHg     SHUNTS MV Vmax:       1.78 m/s     Systemic VTI:  0.16 m MV Vmean:      85.3 cm/s    Systemic Diam: 1.70 cm MV Decel Time: 177 msec MV E velocity: 148.50 cm/s Yvonne Kendall MD Electronically signed by Yvonne Kendall MD Signature Date/Time: 11/11/2021/3:56:17 PM    Final     Microbiology: Recent Results (from the past 240 hour(s))  Resp Panel by RT-PCR (Flu A&B, Covid) Nasopharyngeal Swab     Status: None   Collection Time: 11/10/21  3:45 PM   Specimen: Nasopharyngeal Swab; Nasopharyngeal(NP) swabs in vial transport medium  Result Value Ref Range Status   SARS Coronavirus 2 by RT PCR NEGATIVE NEGATIVE Final    Comment: (NOTE) SARS-CoV-2 target nucleic acids are NOT DETECTED.  The SARS-CoV-2 RNA is generally detectable in upper respiratory specimens during the acute phase of infection. The lowest concentration of SARS-CoV-2 viral copies this assay can detect is 138 copies/mL. A negative result does not preclude SARS-Cov-2 infection and should not be used as the sole basis for treatment or other patient management decisions. A negative result may occur with  improper specimen collection/handling, submission of specimen other than nasopharyngeal swab, presence of viral mutation(s) within the areas targeted by this assay, and inadequate number of viral copies(<138 copies/mL). A negative  result must be combined with clinical observations, patient history, and epidemiological information. The expected result is Negative.  Fact Sheet for Patients:  BloggerCourse.comhttps://www.fda.gov/media/152166/download  Fact Sheet for Healthcare Providers:  SeriousBroker.ithttps://www.fda.gov/media/152162/download  This test is no t yet approved or cleared by the [place] FDA and  has been authorized for detection and/or diagnosis of SARS-CoV-2 by FDA under an Emergency Use Authorization (EUA). This EUA will remain  in effect (meaning this test can be used) for the duration of the COVID-19 declaration under Section 564(b)(1) of the Act, 21 U.S.C.section 360bbb-3(b)(1), unless the authorization is terminated  or revoked sooner.       Influenza A by PCR NEGATIVE NEGATIVE Final   Influenza B by PCR NEGATIVE NEGATIVE Final    Comment: (NOTE) The Xpert Xpress SARS-CoV-2/FLU/RSV plus assay is intended as an aid in the diagnosis of influenza from Nasopharyngeal swab specimens and should not be used as a sole basis for treatment. Nasal washings and aspirates are unacceptable for Xpert Xpress SARS-CoV-2/FLU/RSV testing.  Fact Sheet for Patients: BloggerCourse.comhttps://www.fda.gov/media/152166/download  Fact Sheet for Healthcare Providers: SeriousBroker.ithttps://www.fda.gov/media/152162/download  This test is not yet approved or cleared by the [place] FDA and has been authorized for detection and/or diagnosis of SARS-CoV-2 by FDA under an Emergency Use Authorization (EUA). This EUA will remain in effect (meaning this test can be used) for the duration of the COVID-19 declaration under Section 564(b)(1) of the Act, 21 U.S.C. section 360bbb-3(b)(1), unless the authorization is terminated or revoked.  Performed at [facility], [address] [place], [place] [address]   C Difficile Quick Screen w PCR reflex     Status: None   Collection Time: 11/11/21  6:21 PM   Specimen: STOOL  Result Value Ref Range Status   C Diff  antigen NEGATIVE NEGATIVE Final   C Diff toxin NEGATIVE NEGATIVE Final   C Diff interpretation No C. difficile detected.  Final    Comment: Performed at [facility], [address] [place], [place] [address]  Gastrointestinal Panel by PCR , Stool     Status: None   Collection Time: 11/11/21  6:22 PM   Specimen: STOOL  Result Value Ref Range Status   Campylobacter species NOT DETECTED NOT DETECTED Final   Plesimonas shigelloides NOT DETECTED NOT DETECTED Final   Salmonella species NOT DETECTED NOT DETECTED Final   Yersinia enterocolitica NOT DETECTED NOT DETECTED Final   Vibrio species NOT DETECTED NOT DETECTED Final   Vibrio cholerae NOT DETECTED NOT DETECTED Final   Enteroaggregative E coli (EAEC) NOT DETECTED NOT DETECTED Final   Enteropathogenic E coli (EPEC) NOT DETECTED NOT DETECTED Final   Enterotoxigenic E coli (ETEC) NOT DETECTED NOT DETECTED Final   Shiga like toxin producing E coli (STEC) NOT DETECTED NOT DETECTED Final   Shigella/Enteroinvasive E coli (EIEC) NOT DETECTED NOT DETECTED Final   Cryptosporidium NOT DETECTED NOT DETECTED Final   Cyclospora cayetanensis NOT DETECTED NOT DETECTED Final   Entamoeba histolytica NOT DETECTED NOT DETECTED Final   Giardia lamblia NOT DETECTED NOT DETECTED Final   Adenovirus F40/41 NOT DETECTED NOT DETECTED Final   Astrovirus NOT DETECTED NOT DETECTED Final   Norovirus GI/GII NOT DETECTED NOT DETECTED Final   Rotavirus A NOT DETECTED NOT DETECTED Final   Sapovirus (I, II, IV, and V) NOT DETECTED NOT DETECTED  Final    Comment: Performed at [facility], [address] [place], [place] [address]     Labs: Basic Metabolic Panel: Recent Labs  Lab 11/10/21 1207 11/11/21 1002 11/12/21 0931  NA 139 140 138  K 4.6 4.2 4.2  CL 99 96* 93*  CO2 36* 36* 36*  GLUCOSE 134* 118* 126*  BUN 21 20 20   CREATININE 0.89 0.78 0.72  CALCIUM 8.7* 8.8* 8.9   Liver Function Tests: No results for input(s): AST, ALT, ALKPHOS,  BILITOT, PROT, ALBUMIN in the last 168 hours. No results for input(s): LIPASE, AMYLASE in the last 168 hours. No results for input(s): AMMONIA in the last 168 hours. CBC: Recent Labs  Lab 11/10/21 1207 11/12/21 0931  WBC 5.8 5.7  NEUTROABS  --  4.4  HGB 10.4* 10.8*  HCT 36.0 37.0  MCV 90.2 88.5  PLT 208 192   Cardiac Enzymes: No results for input(s): CKTOTAL, CKMB, CKMBINDEX, TROPONINI in the last 168 hours. BNP: BNP (last 3 results) Recent Labs    11/10/21 1500  BNP 532.8*    ProBNP (last 3 results) No results for input(s): PROBNP in the last 8760 hours.  CBG: No results for input(s): GLUCAP in the last 168 hours.     Signed:  Silvano Bilis MD.  [facility] 11/12/2021, 10:51 AM

## 2021-11-12 NOTE — TOC Initial Note (Signed)
Transition of Care [facility]) - Initial/Assessment Note    Patient Details  Name: [name] MRN: [id number] Date of Birth: 31-Jan-1932  Transition of Care [facility] CM/SW Contact:    Shelbie Hutching, RN Phone Number: 11/12/2021, 10:24 AM  Clinical Narrative:                 Patient admitted to the hospital for acute exacerbation of CHF.  Patient is from [place].  Patient is not sure how long she has lived there but says it has been a while.  She on Chronic oxygen [place] 3 L.  Patient reports that she walks with a walker at the facility but that her legs have been so sore she has not been able to walk lately.  Patient requests that RNCM speak with her son who is her 48.    Expected Discharge Plan: Skilled Nursing Facility Barriers to Discharge: Barriers Resolved   Patient Goals and CMS Choice Patient states their goals for this hospitalization and ongoing recovery are:: Patient agrees to going back to [facility] CMS Medicare.gov Compare Post Acute Care list provided to:: Patient Choice offered to / list presented to : Patient, [facility] POA / Guardian  Expected Discharge Plan and Services Expected Discharge Plan: [place]   Discharge Planning Services: CM Consult Post Acute Care Choice: Resumption of Svcs/PTA Provider, [place] Living arrangements for the past 2 months: [place]                 DME Arranged: N/A DME Agency: NA       HH Arranged: NA HH Agency: NA        Prior Living Arrangements/Services Living arrangements for the past 2 months: [place] Lives with:: Facility Resident Patient language and need for interpreter reviewed:: Yes Do you feel safe going back to the place where you live?: Yes      Need for Family Participation in Patient Care: Yes (Comment) Care giver support system in place?: Yes (comment) Current home services: DME (walker) Criminal Activity/Legal Involvement Pertinent to  Current Situation/Hospitalization: No - Comment as needed  Activities of Daily Living      Permission Sought/Granted Permission sought to share information with : Case Manager, Customer service manager, Family Supports Permission granted to share information with : Yes, Verbal Permission Granted  Share Information with NAME: Minela Alkhafaji  Permission granted to share info w AGENCY: [place] granted to share info w Relationship: son  Permission granted to share info w Contact Information: [phone]  Emotional Assessment Appearance:: Appears stated age Attitude/Demeanor/Rapport: Engaged Affect (typically observed): Accepting Orientation: : Oriented to Self, Oriented to Place, Oriented to Situation Alcohol / Substance Use: Not Applicable Psych Involvement: No (comment)  Admission diagnosis:  Acute exacerbation of CHF (congestive heart failure) (HCC) [I50.9] Atrial fibrillation with rapid ventricular response (HCC) [I48.91] Patient Active Problem List   Diagnosis Date Noted   Atrial fibrillation with rapid ventricular response ([place]) 11/11/2021   Acute exacerbation of CHF (congestive heart failure) ([place]) 11/10/2021   Acute on chronic anemia 04/30/2021   Fall 04/30/2021   Chronic respiratory failure with hypoxia ([place]) 04/30/2021   Closed left hip fracture ([place]) 04/29/2021   Acute decompensated heart failure ([place]) 10/29/2020   Acute CHF (congestive heart failure) ([place]) 10/28/2020   Pneumonia due to COVID-19 virus 10/16/2019   Elevated brain natriuretic peptide (BNP) level 10/16/2019   Lactic acidosis 10/16/2019   Acute on chronic respiratory failure  with hypoxia ([place]) 10/15/2019   Suspected COVID-19 virus infection 10/15/2019   HTN (hypertension) 10/15/2019   Chronic diastolic (congestive) heart failure ([place]) 10/15/2019   Hyperkalemia 10/15/2019   CKD (chronic kidney disease), stage IIIa 10/15/2019   Hypoxia 05/23/2019   Acute delirium  08/16/2018   Dementia with behavioral disturbance 08/16/2018   Encephalopathy acute 08/11/2018   Sepsis ([place]) 05/21/2017   Aspiration pneumonia ([place]) 05/21/2017   GERD (gastroesophageal reflux disease) 05/21/2017   HLD (hyperlipidemia) 05/21/2017   Depression 05/21/2017   Anxiety 05/21/2017   Elevated troponin 05/21/2017   Diabetes ([place]) 05/21/2017   Right humeral fracture 05/21/2017   Nausea and vomiting 03/31/2015   Microcytic anemia 03/31/2015   PCP:  Kirk Ruths, MD Pharmacy:   [facility] [place] - [address] [place] [address] Phone: [phone] Fax: [phone]     Social Determinants of Health (SDOH) Interventions    Readmission Risk Interventions Readmission Risk Prevention Plan 11/12/2021 11/03/2020 05/28/2019  Transportation Screening Complete Complete Complete  PCP or Specialist Appt within 3-5 Days Complete (No Data) -  [place] or [place] Not Complete Complete -  [place] or Home Care Consult comments Lives at [place] for [place] Planning/Counseling Complete - -  Secretary Screening Not Applicable Not Applicable -  Medication Review (RN Care Manager) Complete Complete Complete  PCP or Specialist appointment within 3-5 days of discharge - - (No Data)  [place] or [place] - - Complete  [place] - - Not Applicable  Some recent data might be hidden

## 2021-11-12 NOTE — NC FL2 (Signed)
[place] LEVEL OF CARE SCREENING TOOL     IDENTIFICATION  Patient Name: [name] Birthdate: January 03, 1932 Sex: female Admission Date (Current Location): 11/10/2021  [facility] and [place] Number:  Engineering geologist and Address:  [facility], [address] [place], [place] [address]      Provider Number: [id number]  Attending Physician Name and Address:  Gwynne Edinger, MD  Relative Name and Phone Number:  Dekayla Feland POA- [phone]    Current Level of Care: Hospital Recommended Level of Care: [place] Prior Approval Number:    Date Approved/Denied:   PASRR Number:    Discharge Plan: SNF    Current Diagnoses: Patient Active Problem List   Diagnosis Date Noted   Atrial fibrillation with rapid ventricular response ([place]) 11/11/2021   Acute exacerbation of CHF (congestive heart failure) ([place]) 11/10/2021   Acute on chronic anemia 04/30/2021   Fall 04/30/2021   Chronic respiratory failure with hypoxia ([place]) 04/30/2021   Closed left hip fracture ([place]) 04/29/2021   Acute decompensated heart failure ([place]) 10/29/2020   Acute CHF (congestive heart failure) ([place]) 10/28/2020   Pneumonia due to COVID-19 virus 10/16/2019   Elevated brain natriuretic peptide (BNP) level 10/16/2019   Lactic acidosis 10/16/2019   Acute on chronic respiratory failure with hypoxia ([place]) 10/15/2019   Suspected COVID-19 virus infection 10/15/2019   HTN (hypertension) 10/15/2019   Chronic diastolic (congestive) heart failure ([place]) 10/15/2019   Hyperkalemia 10/15/2019   CKD (chronic kidney disease), stage IIIa 10/15/2019   Hypoxia 05/23/2019   Acute delirium 08/16/2018   Dementia with behavioral disturbance 08/16/2018   Encephalopathy acute 08/11/2018   Sepsis ([place]) 05/21/2017   Aspiration pneumonia ([place]) 05/21/2017   GERD (gastroesophageal reflux disease) 05/21/2017   HLD (hyperlipidemia) 05/21/2017   Depression 05/21/2017   Anxiety  05/21/2017   Elevated troponin 05/21/2017   Diabetes ([place]) 05/21/2017   Right humeral fracture 05/21/2017   Nausea and vomiting 03/31/2015   Microcytic anemia 03/31/2015    Orientation RESPIRATION BLADDER Height & Weight     Self, Situation, Place  O2 ([place] 3 L)   Weight: 90.7 kg Height:  5\' 5"  (165.1 cm)  BEHAVIORAL SYMPTOMS/MOOD NEUROLOGICAL BOWEL NUTRITION STATUS      Incontinent Diet (Dysphagia diet 2)  AMBULATORY STATUS COMMUNICATION OF NEEDS Skin   Extensive Assist Verbally Other (Comment) (reddness to both lower legs)                       Personal Care Assistance Level of Assistance  Bathing, Feeding, Dressing Bathing Assistance: Maximum assistance Feeding assistance: Limited assistance Dressing Assistance: Maximum assistance     Functional Limitations Info  Sight, Hearing, Speech Sight Info: Adequate Hearing Info: Adequate Speech Info: Adequate    SPECIAL CARE FACTORS FREQUENCY                       Contractures Contractures Info: Not present    Additional Factors Info  Code Status, Allergies Code Status Info: DNR Allergies Info: Ampicillin, sulfa           Current Medications (11/12/2021):  This is the current hospital active medication list Current Facility-Administered Medications  Medication Dose Route Frequency Provider Last Rate Last Admin   0.9 %  sodium chloride infusion  250 mL Intravenous PRN Cox, Amy N, DO       acetaminophen (TYLENOL) tablet 650 mg  650 mg Oral Q4H PRN Cox, Amy N, DO  650 mg at 11/11/21 1732   apixaban (ELIQUIS) tablet 5 mg  5 mg Oral BID Noralee Space, RPH   5 mg at 11/11/21 2120   divalproex (DEPAKOTE ER) 24 hr tablet 250 mg  250 mg Oral QHS Cox, Amy N, DO   250 mg at 11/11/21 2120   feeding supplement (ENSURE ENLIVE / ENSURE PLUS) liquid 237 mL  237 mL Oral BID BM Cox, Amy N, DO   237 mL at 11/11/21 1008   furosemide (LASIX) injection 40 mg  40 mg Intravenous BID Gwynne Edinger, MD   40 mg at 11/12/21  0846   hydrALAZINE (APRESOLINE) injection 5 mg  5 mg Intravenous Q6H PRN Cox, Amy N, DO       loperamide (IMODIUM) capsule 2 mg  2 mg Oral Q6H PRN Wouk, Ailene Rud, MD       LORazepam (ATIVAN) tablet 0.25 mg  0.25 mg Oral Q8H PRN Cox, Amy N, DO   0.25 mg at 11/11/21 2119   melatonin tablet 5 mg  5 mg Oral QHS PRN Cox, Amy N, DO   5 mg at 11/11/21 2121   metoprolol tartrate (LOPRESSOR) injection 5 mg  5 mg Intravenous Q2H PRN Cox, Amy N, DO       metoprolol tartrate (LOPRESSOR) tablet 50 mg  50 mg Oral BID Gwynne Edinger, MD   50 mg at 11/11/21 2120   ondansetron (ZOFRAN) injection 4 mg  4 mg Intravenous Q6H PRN Cox, Amy N, DO       QUEtiapine (SEROQUEL) tablet 25 mg  25 mg Oral QHS PRN Cox, Amy N, DO   25 mg at 11/11/21 2338   sertraline (ZOLOFT) tablet 50 mg  50 mg Oral Daily Cox, Amy N, DO   50 mg at 11/11/21 1007   sodium chloride flush (NS) 0.9 % injection 3 mL  3 mL Intravenous Q12H Cox, Amy N, DO   3 mL at 11/12/21 0847   sodium chloride flush (NS) 0.9 % injection 3 mL  3 mL Intravenous PRN Cox, Amy N, DO       Current Outpatient Medications  Medication Sig Dispense Refill   acetaminophen (TYLENOL) 500 MG tablet Take 1,000 mg by mouth 3 (three) times daily.     acidophilus (RISAQUAD) CAPS capsule Take 1 capsule by mouth 2 (two) times daily.     aspirin EC 81 MG tablet Take 1 tablet (81 mg total) by mouth daily. Hold while taking lovenox 30 tablet 11   azelastine (ASTELIN) 0.1 % nasal spray Place 1 spray into both nostrils 2 (two) times daily. Use in each nostril as directed     busPIRone (BUSPAR) 7.5 MG tablet Take 7.5 mg by mouth 3 (three) times daily.     fluticasone (FLONASE) 50 MCG/ACT nasal spray Place 1 spray into both nostrils daily.     furosemide (LASIX) 40 MG tablet Take 1 tablet (40 mg total) by mouth daily. 30 tablet 0   ipratropium-albuterol (DUONEB) 0.5-2.5 (3) MG/3ML SOLN Take 3 mLs by nebulization 2 (two) times daily. 360 mL 0   lisinopril (ZESTRIL) 5 MG tablet Take  5 mg by mouth daily.     melatonin 3 MG TABS tablet Take 3 mg by mouth at bedtime as needed.     metoprolol tartrate (LOPRESSOR) 25 MG tablet Take 1 tablet (25 mg total) by mouth 2 (two) times daily. 60 tablet 0   nystatin (MYCOSTATIN/NYSTOP) powder Apply 1 g topically 2 (two) times daily as  needed (to affected area(s)).     olopatadine (PATANOL) 0.1 % ophthalmic solution Place 1 drop into both eyes daily.     sertraline (ZOLOFT) 50 MG tablet Take 50 mg by mouth daily.     traMADol (ULTRAM) 50 MG tablet Take 1 tablet (50 mg total) by mouth every 6 (six) hours as needed for moderate pain. 30 tablet 0   alum & mag hydroxide-simeth (MAALOX/MYLANTA) 200-200-20 MG/5ML suspension Take 30 mLs by mouth as needed for indigestion or heartburn.     amLODipine (NORVASC) 5 MG tablet Take 5 mg by mouth daily. (Patient not taking: Reported on 11/11/2021)     bismuth subsalicylate (PEPTO BISMOL) 262 MG/15ML suspension Take 262 mg by mouth every 2 (two) hours as needed for indigestion (up to 6 doses in 24 hr).      calcium carbonate (TUMS - DOSED IN MG ELEMENTAL CALCIUM) 500 MG chewable tablet Chew 2 tablets (400 mg of elemental calcium total) by mouth 3 (three) times daily as needed for indigestion or heartburn. 30 tablet 0   Cholecalciferol (VITAMIN D) 50 MCG (2000 UT) tablet Take 2,000 Units by mouth daily. (Patient not taking: Reported on 11/11/2021)     dicyclomine (BENTYL) 20 MG tablet Take 20 mg by mouth 4 (four) times daily. (Patient not taking: Reported on 11/11/2021)     diphenoxylate-atropine (LOMOTIL) 2.5-0.025 MG tablet Take 1 tablet by mouth 2 (two) times daily as needed for diarrhea or loose stools.     divalproex (DEPAKOTE ER) 250 MG 24 hr tablet Take 250 mg by mouth at bedtime.     enoxaparin (LOVENOX) 40 MG/0.4ML injection Inject 0.4 mLs (40 mg total) into the skin daily for 14 days. 5.6 mL 0   esomeprazole (NEXIUM) 20 MG capsule Take 20 mg by mouth daily.  (Patient not taking: Reported on 11/11/2021)      feeding supplement, ENSURE ENLIVE, (ENSURE ENLIVE) LIQD Take 237 mLs by mouth 2 (two) times daily between meals. 237 mL 12   loperamide (IMODIUM A-D) 2 MG tablet Take 2 mg by mouth as needed for diarrhea or loose stools (up to 4 doses in 12 hrs.).     loratadine (CLARITIN) 10 MG tablet Take 10 mg by mouth daily. (Patient not taking: Reported on 11/11/2021)     LORazepam (ATIVAN) 0.5 MG tablet Take 0.25 mg by mouth every 8 (eight) hours as needed for anxiety. (Patient not taking: Reported on 11/11/2021)     magnesium hydroxide (MILK OF MAGNESIA) 400 MG/5ML suspension Take 30 mLs by mouth daily as needed for mild constipation.     metFORMIN (GLUCOPHAGE-XR) 500 MG 24 hr tablet Take 500 mg by mouth 2 (two) times a day. (Patient not taking: Reported on 11/11/2021)     Multiple Vitamin (MULTIVITAMIN WITH MINERALS) TABS tablet Take 1 tablet by mouth daily. (Patient not taking: Reported on 11/11/2021) 30 tablet 0   ondansetron (ZOFRAN) 4 MG tablet Take 4 mg by mouth every 8 (eight) hours as needed for nausea or vomiting.     oxyCODONE (OXY IR/ROXICODONE) 5 MG immediate release tablet Take 0.5-1 tablets (2.5-5 mg total) by mouth every 6 (six) hours as needed for moderate pain (pain score 4-6). (Patient not taking: Reported on 11/10/2021) 30 tablet 0   oxyCODONE (OXY IR/ROXICODONE) 5 MG immediate release tablet Take 1-2 tablets (5-10 mg total) by mouth every 4 (four) hours as needed for severe pain (pain score 7-10). (Patient not taking: Reported on 11/10/2021) 30 tablet 0   Pseudoephedrine-DM-GG (ROBITUSSIN CF  PO) Take 10 mLs by mouth every 6 (six) hours as needed (cough).     psyllium (METAMUCIL) 58.6 % powder Take 1 packet by mouth 2 (two) times a day.     QUEtiapine (SEROQUEL) 25 MG tablet Take 1 tablet (25 mg total) by mouth at bedtime as needed (psychosis, insomnia). (Patient not taking: Reported on 11/10/2021) 30 tablet 0   senna-docusate (SENOKOT-S) 8.6-50 MG tablet Take 1 tablet by mouth 2 (two) times daily.      sodium phosphate Pediatric (FLEET) 3.5-9.5 GM/59ML enema Place 1 enema rectally once as needed for severe constipation.     traZODone (DESYREL) 50 MG tablet Take 25 mg by mouth at bedtime.       Discharge Medications: Please see discharge summary for a list of discharge medications.  Relevant Imaging Results:  Relevant Lab Results:   Additional Information SSN; [id number]  Shelbie Hutching, RN

## 2021-11-12 NOTE — ED Notes (Signed)
This RN called [facility] at [phone] to call report for pt returning. This RN transferred to voicemail for [name]; VM left for her to call ER back for report.

## 2021-11-23 NOTE — Progress Notes (Deleted)
Patient ID: [name], female    DOB: December 18, 1931, 88 y.o.   MRN: [id number]  HPI  [name] is a 88 y/o female with a history of DM, hyperlipidemia, HTN, depression, anxiety, GERD, COPD and chronic heart failure.   Echo report from 11/11/21 reviewed and showed an EF of 60-65% along with mild LVH/ LAE. Echo report from 10/29/20 reviewed and showed an EF of 60-65% along with mild MR.   Was in the ED 11/10/21 due to leg pain, shortness of breath and hypoxia. BNP elevated along with pulmonary edema noted on CXR. Antibiotics given for possible pneumonia.  She presents today for a follow-up visit with a chief complaint of   Past Medical History:  Diagnosis Date   Anxiety    CHF (congestive heart failure) (HCC)    COPD (chronic obstructive pulmonary disease) (HCC)    Dementia (HCC)    Depression    Diabetes mellitus without complication (HCC)    GERD (gastroesophageal reflux disease)    Hip fracture (HCC)    Hyperlipemia    Hypertension    Neuropathy    Vertigo    Past Surgical History:  Procedure Laterality Date   ABDOMINAL HYSTERECTOMY     APPENDECTOMY     BACK SURGERY     tumor removal bengin   CHOLECYSTECTOMY     INTRAMEDULLARY (IM) NAIL INTERTROCHANTERIC Left 04/30/2021   Procedure: INTRAMEDULLARY (IM) NAIL INTERTROCHANTRIC;  Surgeon: Signa Kell, MD;  Location: [facility] ORS;  Service: Orthopedics;  Laterality: Left;   Family History  Problem Relation Age of Onset   Diabetes Mother    Diabetes Sister        x2   Breast cancer Sister    Heart disease Father    Heart disease Brother        x3   Bladder Cancer Neg Hx    Kidney cancer Neg Hx    Prostate cancer Neg Hx    Social History   Tobacco Use   Smoking status: Never   Smokeless tobacco: Never  Substance Use Topics   Alcohol use: No    Alcohol/week: 0.0 standard drinks   Allergies  Allergen Reactions   Ampicillin     Zpac   Sulfa Antibiotics      Review of Systems  Constitutional:  Negative for  appetite change.  HENT:  Positive for rhinorrhea. Negative for congestion and sore throat.   Eyes: Negative.   Respiratory:  Negative for cough.   Cardiovascular:  Negative for leg swelling.  Gastrointestinal:  Negative for abdominal distention.  Endocrine: Negative.   Genitourinary: Negative.   Musculoskeletal:  Positive for arthralgias (left lower leg/ knee). Negative for back pain.  Skin: Negative.   Allergic/Immunologic: Negative.   Neurological:  Positive for light-headedness. Negative for dizziness.  Hematological:  Negative for adenopathy. Bruises/bleeds easily.  Psychiatric/Behavioral:  Positive for sleep disturbance (trouble falling asleep; sleeping on 1 pillow). Negative for dysphoric mood. The patient is nervous/anxious.      Physical Exam Vitals and nursing note reviewed. Exam conducted with a chaperone present (caregive from [facility]).  Constitutional:      Appearance: Normal appearance.  HENT:     Head: Normocephalic and atraumatic.  Cardiovascular:     Rate and Rhythm: Normal rate and regular rhythm.  Pulmonary:     Effort: Pulmonary effort is normal. No respiratory distress.     Breath sounds: No wheezing or rales.  Abdominal:     General: There is no distension.  Palpations: Abdomen is soft.  Musculoskeletal:        General: No tenderness.     Cervical back: Normal range of motion and neck supple.     Right lower leg: No edema.     Left lower leg: Edema (trace pitting) present.  Skin:    General: Skin is warm and dry.     Findings: Bruising (left lower leg) present.  Neurological:     General: No focal deficit present.     Mental Status: She is alert and oriented to person, place, and time.  Psychiatric:        Mood and Affect: Mood normal.        Behavior: Behavior normal.   Assessment & Plan:  1: Chronic heart failure with preserved ejection fraction with structural changes (LVH/LAE)- - NYHA class II - euvolemic today -  currently being weighed weekly; order written for [facility] to weigh daily and call for an overnight weight gain of > 2 pounds or a weekly weight gain of > 5 pounds - weight 152 pounds from last visit here 1 year ago - not adding salt and caregiver says the facility does not cook with salt either - BNP 11/10/21 was 532.8  2: HTN- - BP  - seeing PCP at [facility] - BMP 11/12/21 reviewed and showed sodium 138, potassium 4.2, creatinine 0.72 and GFR >60  3: DM-   - A1c 10/29/20 was 6.4%  4: Atrial fibrillation- - saw cardiology Gwen Pounds) 11/19/21   Facility medication list was reviewed.

## 2021-11-24 ENCOUNTER — Telehealth: Payer: Self-pay | Admitting: Family

## 2021-11-24 ENCOUNTER — Ambulatory Visit: Payer: Medicare Other | Admitting: Family

## 2021-11-24 NOTE — Telephone Encounter (Signed)
Patient did not show for her [facility] Failure [facility] appointment on 11/24/21. Will attempt to reschedule.

## 2021-12-11 ENCOUNTER — Ambulatory Visit (INDEPENDENT_AMBULATORY_CARE_PROVIDER_SITE_OTHER): Payer: Medicare Other | Admitting: Vascular Surgery

## 2021-12-15 ENCOUNTER — Ambulatory Visit (INDEPENDENT_AMBULATORY_CARE_PROVIDER_SITE_OTHER): Payer: Medicare Other | Admitting: Vascular Surgery

## 2022-01-08 ENCOUNTER — Ambulatory Visit (INDEPENDENT_AMBULATORY_CARE_PROVIDER_SITE_OTHER): Payer: Medicare Other | Admitting: Vascular Surgery

## 2022-01-15 ENCOUNTER — Other Ambulatory Visit: Payer: Self-pay

## 2022-01-15 ENCOUNTER — Encounter: Payer: Self-pay | Admitting: Nurse Practitioner

## 2022-01-15 ENCOUNTER — Non-Acute Institutional Stay: Payer: Medicare Other | Admitting: Nurse Practitioner

## 2022-01-15 VITALS — BP 146/73 | Temp 97.0°F | Resp 18 | Wt 152.7 lb

## 2022-01-15 DIAGNOSIS — I5032 Chronic diastolic (congestive) heart failure: Secondary | ICD-10-CM

## 2022-01-15 DIAGNOSIS — R0602 Shortness of breath: Secondary | ICD-10-CM

## 2022-01-15 DIAGNOSIS — Z515 Encounter for palliative care: Secondary | ICD-10-CM

## 2022-01-15 DIAGNOSIS — R5381 Other malaise: Secondary | ICD-10-CM

## 2022-01-15 NOTE — Progress Notes (Signed)
[facility] Consult Note Telephone: [phone]  Fax: [phone]   Date of encounter: 01/15/22 7:16 PM PATIENT NAME: [name] [id number]   [phone] (home)  DOB: October 10, 1932 MRN: [id number] PRIMARY CARE PROVIDER:    [place]:    Contact Information     Name Relation Home Work Mobile   Lamont Son [phone]  [phone]      I met face to face with patient in facility. Palliative Care was asked to follow this patient by consultation request of  [place]  to address advance care planning and complex medical decision making. This is the initial visit.                          ASSESSMENT AND PLAN / RECOMMENDATIONS:  Symptom Management/Plan: 1. Advance Care Planning;  DNR  2. Goals of Care: Goals include to maximize quality of life and symptom management. Our advance care planning conversation included a discussion about:    The value and importance of advance care planning  Exploration of personal, cultural or spiritual beliefs that might influence medical decisions  Exploration of goals of care in the event of a sudden injury or illness  Identification and preparation of a healthcare agent  Review and updating or creation of an advance directive document.  3. Shortness of breath secondary to CHF Continuous O2, inhalation therapy, monitor respiratory status, Monitor edema  4. Debility secondary to CHF, obesity, continue encourage mobility, fall risk  5. Palliative care encounter; Palliative care encounter; Palliative medicine team will continue to support patient, patient's family, and medical team. Visit consisted of counseling and education dealing with the complex and emotionally intense issues of symptom management and palliative care in the setting of serious and potentially life-threatening illness  Follow up Palliative Care  Visit: Palliative care will continue to follow for complex medical decision making, advance care planning, and clarification of goals. Return 1 weeks or prn.  I spent 64 minutes providing this consultation. More than 50% of the time in this consultation was spent in counseling care  PPS: 40%  Chief Complaint: Initial palliative consult for complex medical decision making  HISTORY OF PRESENT ILLNESS:  [name] is a 88 y.o. year old female  with multiple medical problems including dementia, chronic diastolic CHF, LVH, bradycardia, persistent a-fib, HTN, type 2 diabetes, HLD, bilateral leg edema, anemia, anxiety,COPD, CKD, GERD, DM, HLD, obesity, depression. [name] resides at [place] at [facility]. [name] requires assistance with mobility, transfers, adl's including bathing, dressing. [name] feeds herself with decrease appetite per staff.   History obtained from review of EMR, discussion with facility staff and [name].  I reviewed available labs, medications, imaging, studies and related documents from the EMR.  Records reviewed and summarized above.   ROS 10 point system reviewed with [name] and facility staff, negative except HPI  Physical Exam: Constitutional: NAD General: frail appearing, obese, debilitated, chronically ill female EYES:  lids intact ENMT: oral mucous membranes moist CV: S1S2, RRR Pulmonary: Decreased with exp wheezing, no increased work of breathing, + cough, O2 dependent Abdomen: normo-active BS + 4 quadrants, soft and non tende MSK: w/c dependent Skin: warm and dry Neuro:  + generalized weakness,  + cognitive impairment Psych: non-anxious affect, A and Oriented to self, place CURRENT PROBLEM LIST:  Patient Active Problem List   Diagnosis  Date Noted   Atrial fibrillation with rapid ventricular response ([place]) 11/11/2021   Acute exacerbation of CHF (congestive heart failure) ([place]) 11/10/2021   Acute on chronic anemia 04/30/2021   Fall 04/30/2021    Chronic respiratory failure with hypoxia ([place]) 04/30/2021   Closed left hip fracture ([place]) 04/29/2021   Acute decompensated heart failure ([place]) 10/29/2020   Acute CHF (congestive heart failure) ([place]) 10/28/2020   Pneumonia due to COVID-19 virus 10/16/2019   Elevated brain natriuretic peptide (BNP) level 10/16/2019   Lactic acidosis 10/16/2019   Acute on chronic respiratory failure with hypoxia ([place]) 10/15/2019   Suspected COVID-19 virus infection 10/15/2019   HTN (hypertension) 10/15/2019   Chronic diastolic (congestive) heart failure ([place]) 10/15/2019   Hyperkalemia 10/15/2019   CKD (chronic kidney disease), stage IIIa 10/15/2019   Hypoxia 05/23/2019   Acute delirium 08/16/2018   Dementia with behavioral disturbance 08/16/2018   Encephalopathy acute 08/11/2018   Sepsis ([place]) 05/21/2017   Aspiration pneumonia ([place]) 05/21/2017   GERD (gastroesophageal reflux disease) 05/21/2017   HLD (hyperlipidemia) 05/21/2017   Depression 05/21/2017   Anxiety 05/21/2017   Elevated troponin 05/21/2017   Diabetes ([place]) 05/21/2017   Right humeral fracture 05/21/2017   Nausea and vomiting 03/31/2015   Microcytic anemia 03/31/2015   PAST MEDICAL HISTORY:  Active Ambulatory Problems    Diagnosis Date Noted   Nausea and vomiting 03/31/2015   Microcytic anemia 03/31/2015   Sepsis ([place]) 05/21/2017   Aspiration pneumonia ([place]) 05/21/2017   GERD (gastroesophageal reflux disease) 05/21/2017   HLD (hyperlipidemia) 05/21/2017   Depression 05/21/2017   Anxiety 05/21/2017   Elevated troponin 05/21/2017   Diabetes ([place]) 05/21/2017   Right humeral fracture 05/21/2017   Encephalopathy acute 08/11/2018   Acute delirium 08/16/2018   Dementia with behavioral disturbance 08/16/2018   Hypoxia 05/23/2019   Acute on chronic respiratory failure with hypoxia ([place]) 10/15/2019   Suspected COVID-19 virus infection 10/15/2019   HTN (hypertension) 10/15/2019   Chronic diastolic (congestive) heart failure ([place])  10/15/2019   Hyperkalemia 10/15/2019   CKD (chronic kidney disease), stage IIIa 10/15/2019   Pneumonia due to COVID-19 virus 10/16/2019   Elevated brain natriuretic peptide (BNP) level 10/16/2019   Lactic acidosis 10/16/2019   Acute CHF (congestive heart failure) ([place]) 10/28/2020   Acute decompensated heart failure ([place]) 10/29/2020   Closed left hip fracture ([place]) 04/29/2021   Acute on chronic anemia 04/30/2021   Fall 04/30/2021   Chronic respiratory failure with hypoxia ([place]) 04/30/2021   Acute exacerbation of CHF (congestive heart failure) ([place]) 11/10/2021   Atrial fibrillation with rapid ventricular response ([place]) 11/11/2021   Resolved Ambulatory Problems    Diagnosis Date Noted   No Resolved Ambulatory Problems   Past Medical History:  Diagnosis Date   CHF (congestive heart failure) (HCC)    COPD (chronic obstructive pulmonary disease) (HCC)    Dementia ([place])    Diabetes mellitus without complication ([place])    Hip fracture ([place])    Hyperlipemia    Hypertension    Neuropathy    Vertigo    SOCIAL HX:  Social History   Tobacco Use   Smoking status: Never   Smokeless tobacco: Never  Substance Use Topics   Alcohol use: No    Alcohol/week: 0.0 standard drinks   FAMILY HX:  Family History  Problem Relation Age of Onset   Diabetes Mother    Diabetes Sister        x2   Breast cancer Sister    Heart disease Father  Heart disease Brother        x3   Bladder Cancer Neg Hx    Kidney cancer Neg Hx    Prostate cancer Neg Hx      ALLERGIES:  Allergies  Allergen Reactions   Ampicillin     Zpac   Sulfa Antibiotics      PERTINENT MEDICATIONS:  Outpatient Encounter Medications as of 01/15/2022  Medication Sig   acetaminophen (TYLENOL) 500 MG tablet Take 1,000 mg by mouth 3 (three) times daily.   acidophilus (RISAQUAD) CAPS capsule Take 1 capsule by mouth 2 (two) times daily.   alum & mag hydroxide-simeth (MAALOX/MYLANTA) 200-200-20 MG/5ML suspension Take 30 mLs by  mouth as needed for indigestion or heartburn.   apixaban (ELIQUIS) 5 MG TABS tablet Take 1 tablet (5 mg total) by mouth 2 (two) times daily.   azelastine (ASTELIN) 0.1 % nasal spray Place 1 spray into both nostrils 2 (two) times daily. Use in each nostril as directed   bismuth subsalicylate (PEPTO BISMOL) 262 MG/15ML suspension Take 262 mg by mouth every 2 (two) hours as needed for indigestion (up to 6 doses in 24 hr).    busPIRone (BUSPAR) 7.5 MG tablet Take 7.5 mg by mouth 3 (three) times daily.   calcium carbonate (TUMS - DOSED IN MG ELEMENTAL CALCIUM) 500 MG chewable tablet Chew 2 tablets (400 mg of elemental calcium total) by mouth 3 (three) times daily as needed for indigestion or heartburn.   diphenoxylate-atropine (LOMOTIL) 2.5-0.025 MG tablet Take 1 tablet by mouth 2 (two) times daily as needed for diarrhea or loose stools.   divalproex (DEPAKOTE ER) 250 MG 24 hr tablet Take 250 mg by mouth at bedtime.   feeding supplement, ENSURE ENLIVE, (ENSURE ENLIVE) LIQD Take 237 mLs by mouth 2 (two) times daily between meals.   fluticasone (FLONASE) 50 MCG/ACT nasal spray Place 1 spray into both nostrils daily.   furosemide (LASIX) 40 MG tablet Take 1 tablet (40 mg total) by mouth daily.   ipratropium-albuterol (DUONEB) 0.5-2.5 (3) MG/3ML SOLN Take 3 mLs by nebulization 2 (two) times daily.   lisinopril (ZESTRIL) 5 MG tablet Take 5 mg by mouth daily.   loperamide (IMODIUM A-D) 2 MG tablet Take 2 mg by mouth as needed for diarrhea or loose stools (up to 4 doses in 12 hrs.).   magnesium hydroxide (MILK OF MAGNESIA) 400 MG/5ML suspension Take 30 mLs by mouth daily as needed for mild constipation.   melatonin 3 MG TABS tablet Take 3 mg by mouth at bedtime as needed.   metoprolol tartrate (LOPRESSOR) 25 MG tablet Take 2 tablets (50 mg total) by mouth 2 (two) times daily.   Multiple Vitamin (MULTIVITAMIN WITH MINERALS) TABS tablet Take 1 tablet by mouth daily. (Patient not taking: Reported on 11/11/2021)    nystatin (MYCOSTATIN/NYSTOP) powder Apply 1 g topically 2 (two) times daily as needed (to affected area(s)).   olopatadine (PATANOL) 0.1 % ophthalmic solution Place 1 drop into both eyes daily.   ondansetron (ZOFRAN) 4 MG tablet Take 4 mg by mouth every 8 (eight) hours as needed for nausea or vomiting.   Pseudoephedrine-DM-GG (ROBITUSSIN CF PO) Take 10 mLs by mouth every 6 (six) hours as needed (cough).   psyllium (METAMUCIL) 58.6 % powder Take 1 packet by mouth 2 (two) times a day.   senna-docusate (SENOKOT-S) 8.6-50 MG tablet Take 1 tablet by mouth 2 (two) times daily.   sertraline (ZOLOFT) 50 MG tablet Take 50 mg by mouth daily.   sodium phosphate  Pediatric (FLEET) 3.5-9.5 GM/59ML enema Place 1 enema rectally once as needed for severe constipation.   traMADol (ULTRAM) 50 MG tablet Take 1 tablet (50 mg total) by mouth every 6 (six) hours as needed for moderate pain.   traZODone (DESYREL) 50 MG tablet Take 25 mg by mouth at bedtime.   No facility-administered encounter medications on file as of 01/15/2022.   Thank you for the opportunity to participate in the care of [name].  The palliative care team will continue to follow. Please call our office at [phone] if we can be of additional assistance.   Grahm Etsitty Z Ariana Cavenaugh, NP ,

## 2022-01-18 ENCOUNTER — Non-Acute Institutional Stay: Payer: Medicare Other | Admitting: Nurse Practitioner

## 2022-01-18 ENCOUNTER — Encounter: Payer: Self-pay | Admitting: Nurse Practitioner

## 2022-01-18 VITALS — BP 156/92 | HR 82 | Temp 98.1°F | Resp 18 | Wt 152.7 lb

## 2022-01-18 DIAGNOSIS — I5032 Chronic diastolic (congestive) heart failure: Secondary | ICD-10-CM

## 2022-01-18 DIAGNOSIS — Z515 Encounter for palliative care: Secondary | ICD-10-CM

## 2022-01-18 DIAGNOSIS — R5381 Other malaise: Secondary | ICD-10-CM

## 2022-01-18 DIAGNOSIS — R0602 Shortness of breath: Secondary | ICD-10-CM

## 2022-01-18 NOTE — Progress Notes (Addendum)
? ? ?Manufacturing engineer ?Community Palliative Care Consult Note ?Telephone: [phone]  ?Fax: [phone]  ? ? ?Date of encounter: 01/18/22 ?9:07 PM ?PATIENT NAME: [place] ?[place] [address]   ?[phone] (home)  ?DOB: 1932/02/12 ?MRN: [id number] ?PRIMARY CARE PROVIDER:   [facility] ?Dr Ouida Sills ? ?RESPONSIBLE PARTY:    ?Contact Information   ? ? Name Relation Home Work Mobile  ? Rodneisha, Bonnet POA Son [phone]  [phone]  ? ?  ? ?I met face to face with patient in facility. Palliative Care was asked to follow this patient by consultation request of [place] to address advance care planning and complex medical decision making. This is a follow up visit.                                  ?ASSESSMENT AND PLAN / RECOMMENDATIONS:  ?Symptom Management/Plan: ?1. Advance Care Planning;  DNR ?  ?2. Goals of Care: Goals include to maximize quality of life and symptom management. Our advance care planning conversation included a discussion about:    ?The value and importance of advance care planning  ?Exploration of personal, cultural or spiritual beliefs that might influence medical decisions  ?Exploration of goals of care in the event of a sudden injury or illness  ?Identification and preparation of a healthcare agent  ?Review and updating or creation of an advance directive document. ?  ?3. Shortness of breath secondary to CHF ?Continuous O2, inhalation therapy, monitor respiratory status, Monitor edema ?  ?4. Debility secondary to CHF, obesity, continue encourage mobility, fall risk ?  ?5. Palliative care encounter; Palliative care encounter; Palliative medicine team will continue to support patient, patient's family, and medical team. Visit consisted of counseling and education dealing with the complex and emotionally intense issues of symptom management and palliative care in the setting of serious and potentially life-threatening  illness ? ?Follow up Palliative Care Visit: Palliative care will continue to follow for complex medical decision making, advance care planning, and clarification of goals. Return 4 weeks or prn. ? ?I spent 45 minutes providing this consultation start at 2:00pm. More than 50% of the time in this consultation was spent in counseling and care coordination. ?PPS: 40% ? ?Chief Complaint: Follow up palliative consult for complex medical decision making ? ?HISTORY OF PRESENT ILLNESS:  Topacio Lorri Fukuhara is a 88 y.o. year old female  with multiple medical problems including dementia, chronic diastolic CHF, LVH, bradycardia, persistent a-fib, HTN, type 2 diabetes, HLD, bilateral leg edema, anemia, anxiety,COPD, CKD, GERD, DM, HLD, obesity, depression. Ms. Kipnis resides at [place] at [facility]. Ms. Kilts requires assistance with mobility, transfers, adl's including bathing, dressing. Ms. Slager feeds herself with decrease appetite per staff. Ms. Heatherly is O2 dependent. At present, Ms. Holsonback is sitting in the w/c in her room. Ms. Searing and I talked about how she has been feeling. Ms. Tisdell endorses today has been a good day. We talked about ros, symptoms, functional abilities, appetite, foods she likes. We talked about O2, cough, shortness of breath. We talked about residing STR at [facility]. We talked about slow progress. We talked about her goals. We talked about role pc in poc. Medical goals reviewed. I called Mr Quincy, Mrs Keshishyan son, clinical update given. We talked about PMH, events lead up to current placement at [facility] with hopes of her returning back to more of an independent state. We talked  about realistic expectations. We talked about role pc, medical goals, supportive care. We talked about f/u visit will continue to monitor progress with therapy. We did talk about hospice services under Medicare benefit, what is provided though wishes are to continue more aggressive rehab approach currently. Updated staff.  ? ?History obtained from  review of EMR, discussion with facility staff, Ms. Levitz.  ?I reviewed available labs, medications, imaging, studies and related documents from the EMR.  Records reviewed and summarized above.  ? ?ROS ?10 point system reviewed with Ms. Burbano, all negative except HPI ? ?Physical Exam: ?Constitutional: NAD ?General: frail appearing, elderly, obese, pleasant female ?EYES: lids intact ?ENMT: oral mucous membranes moist ?CV: S1S2, RRR ?Pulmonary: clear, decrease bases, no increased work of breathing, +cough, O2 ?Abdomen: normo-active BS + 4 quadrants, soft and non tender ?MSK: w/c dependent ?Skin: warm and dry ?Neuro:  +generalized weakness,  + cognitive impairment ?Psych: non-anxious affect, A and O x 2 ?Thank you for the opportunity to participate in the care of [name].  The palliative care team will continue to follow. Please call our office at [phone] if we can be of additional assistance.  ? ?Javarius Tsosie Z Raidyn Breiner, NP  ? ?

## 2022-01-19 ENCOUNTER — Other Ambulatory Visit: Payer: Self-pay

## 2022-01-29 IMAGING — CT CT HEAD W/O CM
4 of 8 series · 16 of 47 positions shown, 17 images · non-contrast
Comparison: CT head dated April 29, 2021

CLINICAL DATA: Delirium, encephalopathy

EXAM:
CT HEAD WITHOUT CONTRAST
TECHNIQUE: Contiguous axial images were obtained from the base of the skull
through the vertex without intravenous contrast.

[Series 6: head bone · axial · 0.49mm/px · z∈[-130,-18]mm · 7 of 82 slices shown]
[im 7/82  bone]
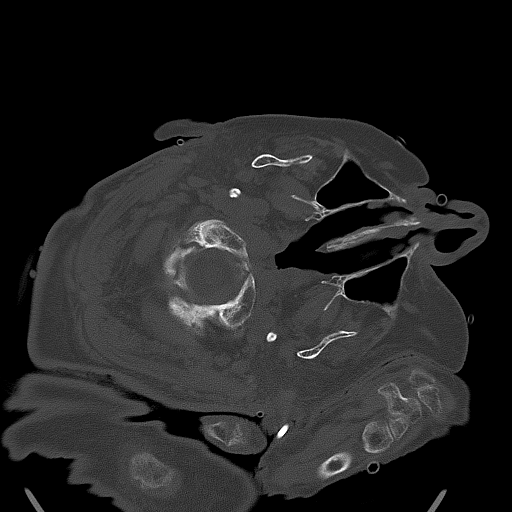
[im 19/82  bone]
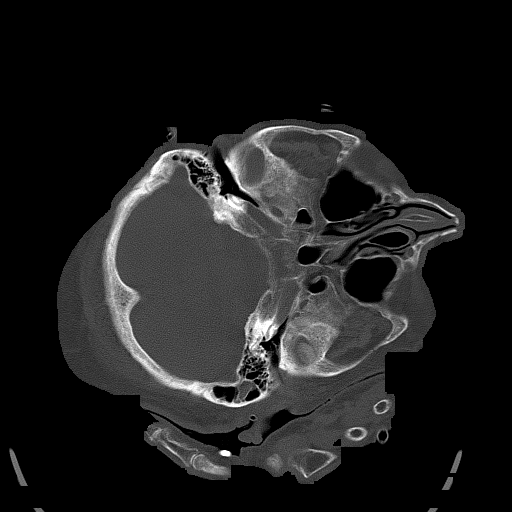
[im 25/82  bone]
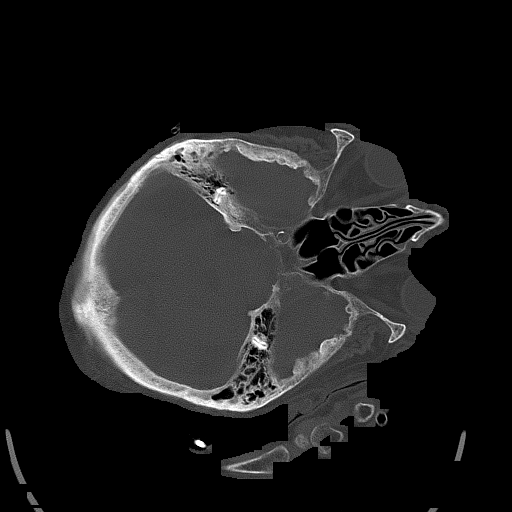
[im 38/82  bone]
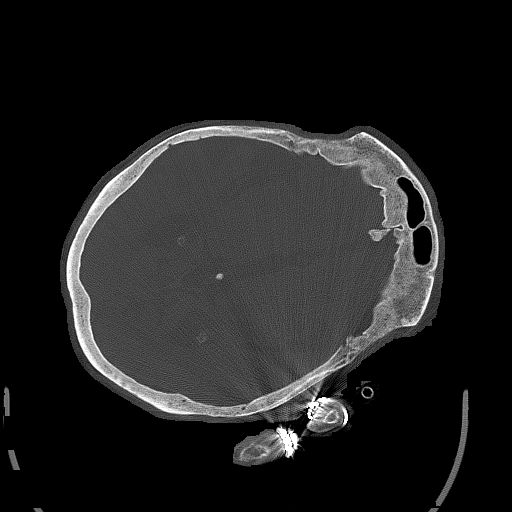
[im 44/82  bone]
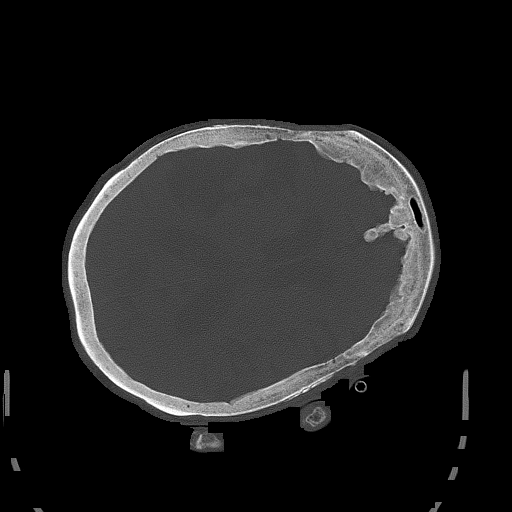
[im 57/82  bone]
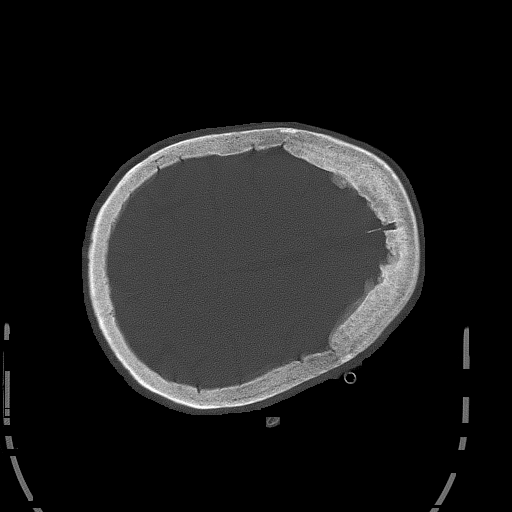
[im 63/82  bone]
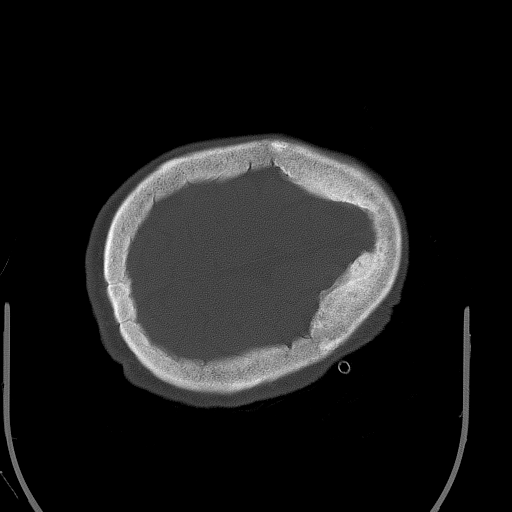

[Series 7: head wo · axial · 0.49mm/px · z∈[-102,-22]mm · 3 of 33 slices shown, 4 images]
[im 9/33  brain]
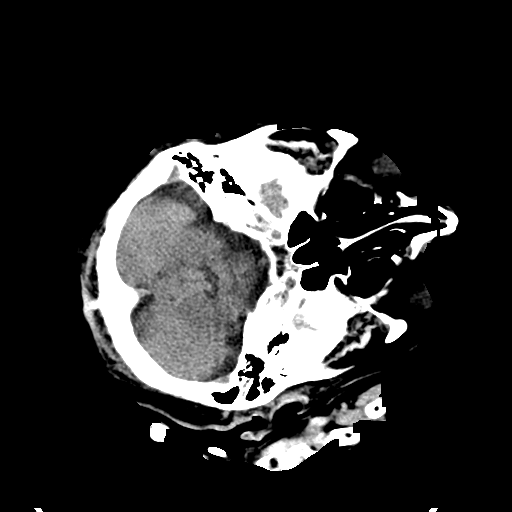
[im 9/33  bone]
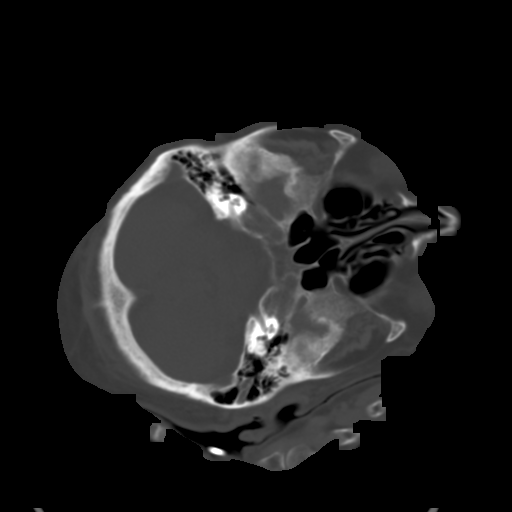
[im 17/33  brain]
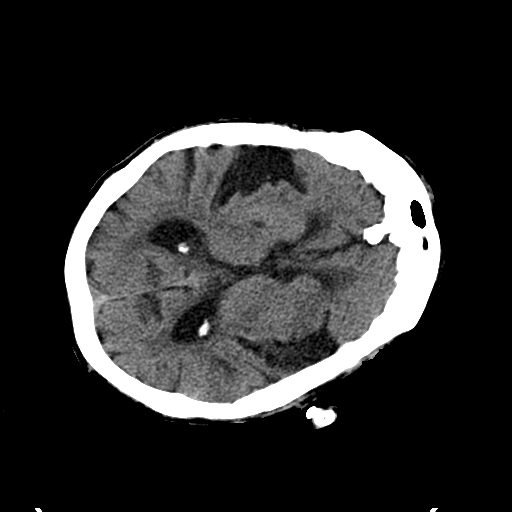
[im 25/33  brain]
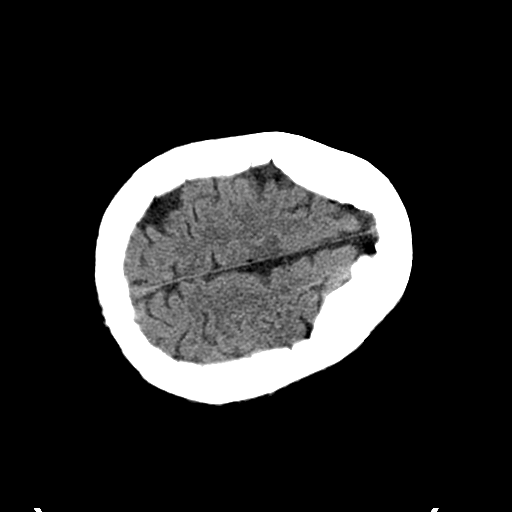

[Series 12: coronal soft tissue · coronal · 0.27mm/px · 3 of 80 slices shown]
[im 20/80  brain]
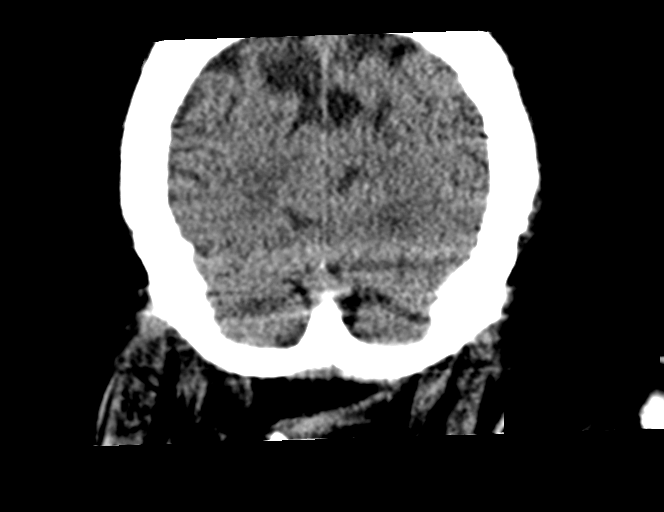
[im 40/80  brain]
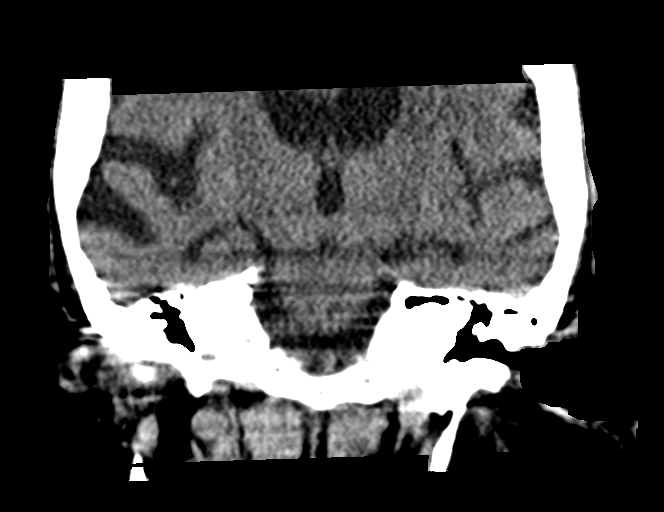
[im 60/80  brain]
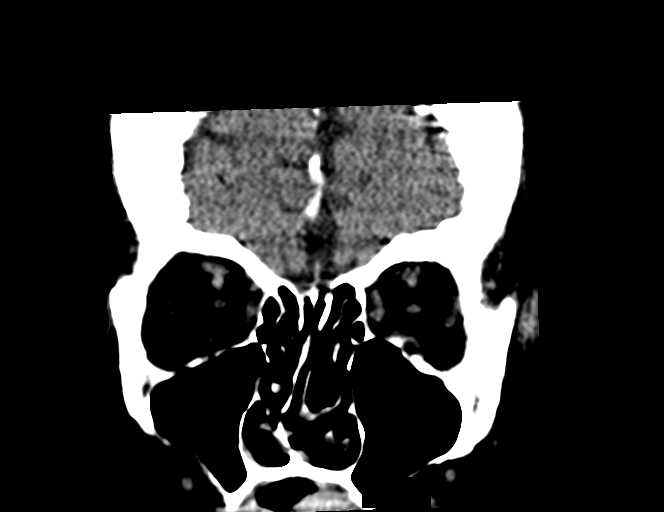

[Series 13: sagittal soft tissue · sagittal · 0.27mm/px · 3 of 60 slices shown]
[im 12/60  brain]
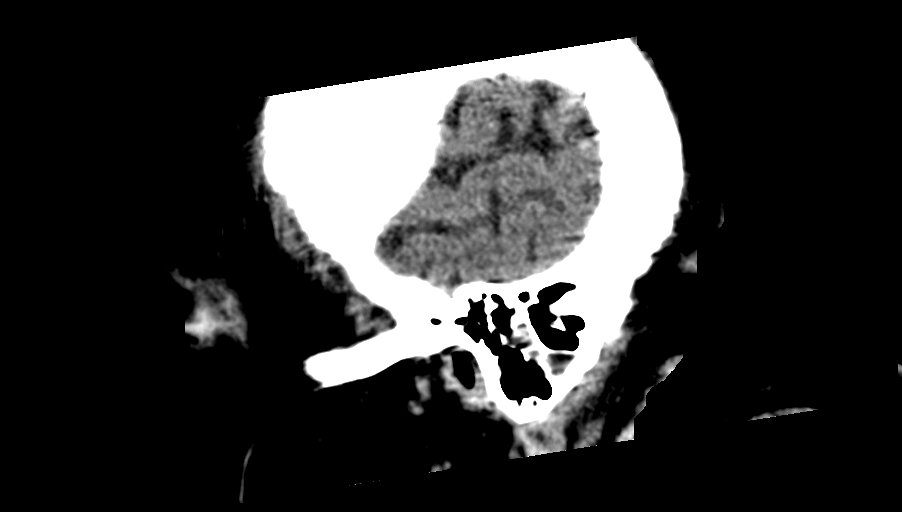
[im 24/60  brain]
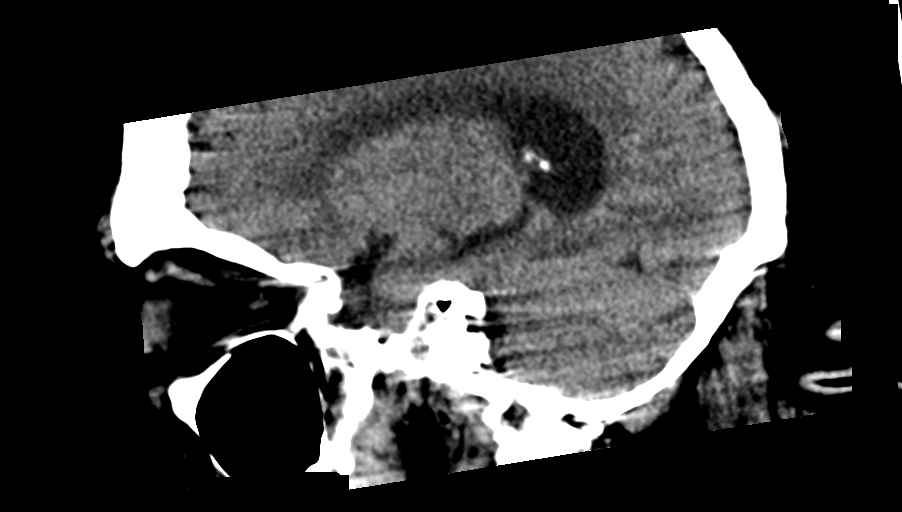
[im 36/60  brain]
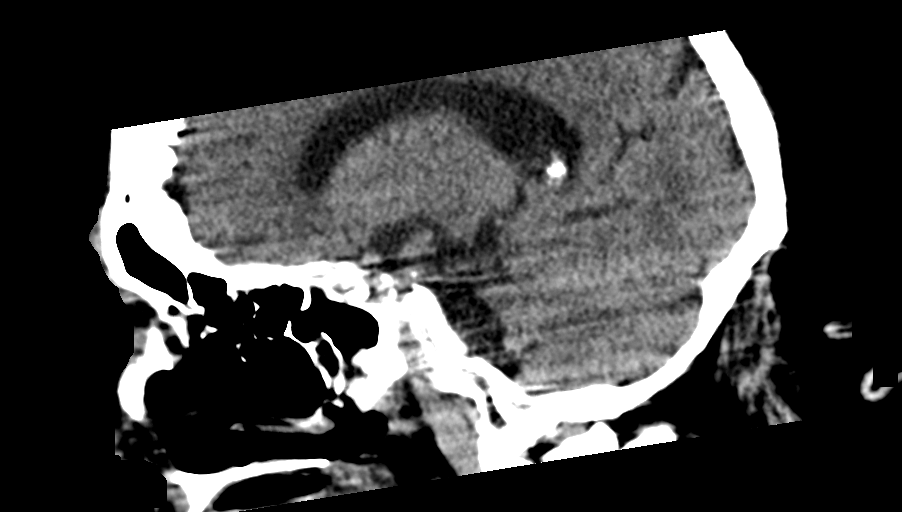

[16 of 47 positions shown; findings below may reference images not displayed]

FINDINGS: Brain: No evidence of acute infarction, hemorrhage, hydrocephalus,
extra-axial collection or mass lesion/mass effect. Cerebral atrophy
and chronic microvascular ischemic changes of the white matter,
unchanged.

Vascular: No hyperdense vessel or unexpected calcification.

Skull: Hyperostosis frontalis interna. No acute osseous abnormality.

Sinuses/Orbits: No acute finding.

Other: None.
IMPRESSION: 1. No acute intracranial abnormality.
2. Moderate cerebral volume loss and chronic microvascular ischemic
changes of the white matter, unchanged.

## 2022-02-15 ENCOUNTER — Encounter: Payer: Self-pay | Admitting: Nurse Practitioner

## 2022-02-15 ENCOUNTER — Non-Acute Institutional Stay: Payer: Medicare Other | Admitting: Nurse Practitioner

## 2022-02-15 VITALS — BP 136/76 | HR 80 | Temp 98.1°F | Resp 18 | Wt 147.5 lb

## 2022-02-15 DIAGNOSIS — R5381 Other malaise: Secondary | ICD-10-CM

## 2022-02-15 DIAGNOSIS — I5032 Chronic diastolic (congestive) heart failure: Secondary | ICD-10-CM

## 2022-02-15 DIAGNOSIS — Z515 Encounter for palliative care: Secondary | ICD-10-CM

## 2022-02-15 DIAGNOSIS — R0602 Shortness of breath: Secondary | ICD-10-CM

## 2022-02-15 NOTE — Progress Notes (Addendum)
? ? ?Manufacturing engineer ?Community Palliative Care Consult Note ?Telephone: [phone]  ?Fax: [phone]  ? ? ?Date of encounter: 02/15/22 ?7:18 PM ?PATIENT NAME: [place] ?[place] [address]   ?[phone] (home)  ?DOB: Aug 12, 1932 ?MRN: [id number] ?PRIMARY CARE PROVIDER:    ?[facility] ? ?RESPONSIBLE PARTY:    ?Contact Information   ? ? Name Relation Home Work Mobile  ? Udell, Blasingame POA Son [phone]  [phone]  ? ?  ? ?I met face to face with patient in facility. Palliative Care was asked to follow this patient by consultation request of  [place] to address advance care planning and complex medical decision making. This is a follow up visit.                                  ?ASSESSMENT AND PLAN / RECOMMENDATIONS:  ?Symptom Management/Plan: ?1. Advance Care Planning;  DNR ?  ?2. Shortness of breath secondary to CHF; stable today; ?Continuous O2, inhalation therapy, monitor respiratory status, Monitor edema ? ?12/09/2021 weight 169.7 lbs ?02/03/2022 weight 147.5 lbs ?BMI 24.5 ?  ?3. Debility secondary to CHF, obesity, continue encourage mobility, fall risk ?  ?4. Palliative care encounter; Palliative care encounter; Palliative medicine team will continue to support patient, patient's family, and medical team. Visit consisted of counseling and education dealing with the complex and emotionally intense issues of symptom management and palliative care in the setting of serious and potentially life-threatening illness ? ?Follow up Palliative Care Visit: Palliative care will continue to follow for complex medical decision making, advance care planning, and clarification of goals. Return 4 weeks or prn. ? ?I spent 38 minutes providing this consultation starting at 12:30pm. More than 50% of the time in this consultation was spent in counseling and care coordination. ?PPS: 40% ? ?Chief Complaint: Follow up palliative consult for complex medical  decision making ? ?HISTORY OF PRESENT ILLNESS:  Redonna December Hedtke is a 88 y.o. year old female  with multiple medical problems including dementia, chronic diastolic CHF, LVH, bradycardia, persistent a-fib, HTN, type 2 diabetes, HLD, bilateral leg edema, anemia, anxiety,COPD, CKD, GERD, DM, HLD, obesity, depression. Ms. Hannig resides at [place] at [facility]. Ms. Vukelich requires assistance with mobility, transfers, adl's including bathing, dressing. Ms. Yusupov feeds herself with fair appetite per staff. Ms. Bovard is O2 dependent. At present, Ms. Heinrich is sitting in the w/c in her room. Ms. Nippert and I talked about how she has been feeling. Ms. Kneisley endorses she is waiting to see her son this evening, he was not able to come yesterday, Easter. We talked about ros, symptoms, which currently denies, endorses shortness of breath has improved, continues some intermit cough. We talked about O2, cough, chronic. Medical goals reviewed. I called Mr Stivers, Mrs Dreyfuss son, clinical update given, message left.  Emotional support provided to Ms. Gladwell, will continue to monitor, follow with PC. Updated staff.  ? ?History obtained from review of EMR, discussion facility staff and [name].  ?I reviewed available labs, medications, imaging, studies and related documents from the EMR.  Records reviewed and summarized above.  ? ?ROS ?10 point system reviewed all negative except HPI ? ?Physical Exam: ?Constitutional: NAD ?General: frail appearing, chronically ill, pleasant female ?EYES:  lids intact ?ENMT: oral mucous membranes moist, ?CV: S1S2, RRR ?Pulmonary: clear, decrease bases, no increased work of breathing, O2 ?Abdomen: normo-active BS + 4 quadrants, soft  and non tender ?MSK: w/c dependent ?Skin: warm and dry ?Neuro:  + generalized weakness,  + cognitive impairment ?Psych: non-anxious affect, A and Oriented to self, pleace ?Thank you for the opportunity to participate in the care of [name].  The palliative care team will continue to  follow. Please call our office at [phone] if we can be of additional assistance.  ? ?Zade Falkner Z Ada Woodbury, NP ? ?

## 2022-03-08 ENCOUNTER — Non-Acute Institutional Stay: Payer: Medicare Other | Admitting: Nurse Practitioner

## 2022-03-08 ENCOUNTER — Encounter: Payer: Self-pay | Admitting: Nurse Practitioner

## 2022-03-08 VITALS — BP 155/70 | HR 88 | Temp 98.0°F | Resp 18 | Wt 148.0 lb

## 2022-03-08 DIAGNOSIS — R0602 Shortness of breath: Secondary | ICD-10-CM

## 2022-03-08 DIAGNOSIS — R5381 Other malaise: Secondary | ICD-10-CM

## 2022-03-08 DIAGNOSIS — Z515 Encounter for palliative care: Secondary | ICD-10-CM

## 2022-03-08 DIAGNOSIS — I5032 Chronic diastolic (congestive) heart failure: Secondary | ICD-10-CM

## 2022-03-08 NOTE — Progress Notes (Addendum)
? ? ?Manufacturing engineer ?[facility] Consult Note ?Telephone: [phone]  ?Fax: [phone]  ? ? ?Date of encounter: 03/08/22 ?7:37 PM ?PATIENT NAME: [name] ?[name] [name]   ?[phone] (home)  ?DOB: 02-May-1932 ?MRN: [id number] ?PRIMARY CARE PROVIDER:    ?[facility] ? ?RESPONSIBLE PARTY:    ?Contact Information   ? ? Name Relation Home Work Mobile  ? Morris, Markham POA Son [phone]  [phone]  ? ?  ? ?I met face to face with patient in facility. Palliative Care was asked to follow this patient by consultation request of [place] to address advance care planning and complex medical decision making. This is a follow up visit.                               ?ASSESSMENT AND PLAN / RECOMMENDATIONS:  ?Symptom Management/Plan: ?1. Advance Care Planning;  DNR ?  ?2. Shortness of breath secondary to CHF; stable today; ?Continuous O2, inhalation therapy, monitor respiratory status, Monitor edema ?  ?12/09/2021 weight 169.7 lbs ?02/03/2022 weight 147.5 lbs ?02/15/2022 weight 148 lbs ?  ?3. Debility secondary to CHF, obesity, continue encourage mobility, fall risk ?  ?4. Palliative care encounter; Palliative care encounter; Palliative medicine team will continue to support patient, patient's family, and medical team. Visit consisted of counseling and education dealing with the complex and emotionally intense issues of symptom management and palliative care in the setting of serious and potentially life-threatening illness ? ?Follow up Palliative Care Visit: Palliative care will continue to follow for complex medical decision making, advance care planning, and clarification of goals. Return 4 weeks or prn. ? ?I spent 47 minutes providing this consultation started at 1:15pm. More than 50% of the time in this consultation was spent in counseling and care coordination. ?PPS: 40% ?Chief Complaint: Follow up palliative consult for complex  medical decision making ? ?HISTORY OF PRESENT ILLNESS:  [name] [name] is a 88 y.o. year old female  with multiple medical problems including dementia, chronic diastolic CHF, LVH, bradycardia, persistent a-fib, HTN, type 2 diabetes, HLD, bilateral leg edema, anemia, anxiety,COPD, CKD, GERD, DM, HLD, obesity, depression. [name] [name] resides at [place] at [facility]. [name] [name] requires assistance with mobility, transfers, adl's including bathing, dressing. [name] [name] feeds herself with fair appetite per staff. [name] [name] is O2 dependent. At present, [name] [name] is sitting in the w/c in her room next to her room-mate's bed. [name] [name] and I talked about how she has been feeling. [name] [name] endorses she is having a very good day. Her breathing has improved. No pain. We talked about ros, symptoms, appetite, [name] [name] talked about foods she likes. [name] [name] talked about her room-mate, quality of life, her daily routine, [name] [name] was cooperative with assessment. Most of PC visit supportive. Medical goals reviewed. I called Mr [name], Mrs [name] son, clinical update given, message left.  Emotional support provided to [name] [name], will continue to monitor, follow with PC. Updated staff.   ? ?History obtained from review of EMR, discussion with primary team, and interview with family, facility staff/caregiver and/or Ms. [name] [name].  ?I reviewed available labs, medications, imaging, studies and related documents from the EMR.  Records reviewed and summarized above.  ? ?History obtained from review of EMR, discussion facility staff and Ms. [name] [name].  ?I reviewed available labs, medications, imaging, studies and related documents from the EMR.  Records reviewed and summarized  above.  ?  ?ROS ?10 point system reviewed all negative except HPI ?  ?Physical Exam: ?Constitutional: NAD ?General: frail appearing, chronically ill, pleasant female ?EYES:  lids intact ?ENMT: oral mucous membranes moist, ?CV: S1S2, RRR ?Pulmonary: clear, decrease bases, no increased  work of breathing, O2 ?Abdomen: normo-active BS + 4 quadrants, soft and non tender ?MSK: w/c dependent ?Skin: warm and dry ?Neuro:  + generalized weakness,  + cognitive impairment ?Psych: non-anxious affect, A and Oriented ? ?Thank you for the opportunity to participate in the care of Ms. [name] [name].  The palliative care team will continue to follow. Please call our office at [phone] if we can be of additional assistance.  ? ?Rovena Hearld Z Jo Cerone, NP  ? ?  ?

## 2022-03-16 NOTE — Progress Notes (Signed)
? Patient ID: [name], female    DOB: 02-08-1932, 88 y.o.   MRN: [id number] ? ?[name] is a 88 y/o female with a history of DM, hyperlipidemia, HTN, depression, anxiety, GERD, COPD and chronic heart failure.  ? ?Echo report from 11/11/21 reviewed and showed an EF of 60-65% along with mild LVH. Echo report from 10/29/20 reviewed and showed an EF of 60-65% along with mild MR.  ? ?Was in the ED 11/10/21 due to bilateral ankle pain along with SOB. Antibiotics provided for possible pneumonia. Ankle xrays negative and she was released.  ? ?She presents today for a follow-up visit with a chief complaint of moderate shortness of breath with minimal exertion. Describes this as chronic in nature although says that she feels like its a little worse. She has associated fatigue, nausea, rhinorrhea, difficulty sleeping, anxiety and light-headedness along with this. She denies any cough, chest pain, pedal edema, palpitations or abdominal distention.  ? ?She says that her biggest concern is of this nausea/emesis and rhinorrhea that she has and repeatedly asks for something for this.  ? ?Past Medical History:  ?Diagnosis Date  ? Anxiety   ? CHF (congestive heart failure) ([place])   ? COPD (chronic obstructive pulmonary disease) ([place])   ? Dementia ([place])   ? Depression   ? Diabetes mellitus without complication ([place])   ? GERD (gastroesophageal reflux disease)   ? Hip fracture ([place])   ? Hyperlipemia   ? Hypertension   ? Neuropathy   ? Vertigo   ? ?Past Surgical History:  ?Procedure Laterality Date  ? ABDOMINAL HYSTERECTOMY    ? APPENDECTOMY    ? BACK SURGERY    ? tumor removal bengin  ? CHOLECYSTECTOMY    ? INTRAMEDULLARY (IM) NAIL INTERTROCHANTERIC Left 04/30/2021  ? Procedure: INTRAMEDULLARY (IM) NAIL INTERTROCHANTRIC;  Surgeon: Leim Fabry, MD;  Location: [facility] ORS;  Service: Orthopedics;  Laterality: Left;  ? ?Family History  ?Problem Relation Age of Onset  ? Diabetes Mother   ? Diabetes Sister   ?     x2  ? Breast cancer Sister    ? Heart disease Father   ? Heart disease Brother   ?     x3  ? Bladder Cancer Neg Hx   ? Kidney cancer Neg Hx   ? Prostate cancer Neg Hx   ? ?Social History  ? ?Tobacco Use  ? Smoking status: Never  ? Smokeless tobacco: Never  ?Substance Use Topics  ? Alcohol use: No  ?  Alcohol/week: 0.0 standard drinks  ? ?Allergies  ?Allergen Reactions  ? Ampicillin   ?  Zpac  ? Sulfa Antibiotics   ? ?Prior to Admission medications   ?Medication Sig Start Date End Date Taking? Authorizing Provider  ?acetaminophen (TYLENOL) 500 MG tablet Take 1,000 mg by mouth 3 (three) times daily.   Yes [provider]  ?acidophilus (RISAQUAD) CAPS capsule Take 1 capsule by mouth 2 (two) times daily.   Yes [provider]  ?apixaban (ELIQUIS) 5 MG TABS tablet Take 1 tablet (5 mg total) by mouth 2 (two) times daily. 11/12/21  Yes Wouk, Ailene Rud, MD  ?azelastine (ASTELIN) 0.1 % nasal spray Place 1 spray into both nostrils 2 (two) times daily. Use in each nostril as directed   Yes [provider]  ?busPIRone (BUSPAR) 7.5 MG tablet Take 10 mg by mouth 3 (three) times daily.   Yes [provider]  ?divalproex (DEPAKOTE ER) 250 MG 24 hr tablet Take 250  mg by mouth at bedtime.   Yes [provider]  ?furosemide (LASIX) 40 MG tablet Take 1 tablet (40 mg total) by mouth daily. ?Patient taking differently: Take 60 mg by mouth daily. 11/03/20 03/17/22 Yes Sreenath, Sudheer B, MD  ?ipratropium-albuterol (DUONEB) 0.5-2.5 (3) MG/3ML SOLN Take 3 mLs by nebulization 2 (two) times daily. 08/23/18  Yes Wieting, Richard, MD  ?lisinopril (ZESTRIL) 5 MG tablet Take 20 mg by mouth daily.   Yes [provider]  ?metoprolol tartrate (LOPRESSOR) 25 MG tablet Take 2 tablets (50 mg total) by mouth 2 (two) times daily. 11/12/21  Yes Wouk, Ailene Rud, MD  ?olopatadine (PATANOL) 0.1 % ophthalmic solution Place 1 drop into both eyes daily.   Yes [provider]  ?OXYGEN Inhale 3 L/min into the lungs.   Yes  [provider]  ?polyethylene glycol (MIRALAX / GLYCOLAX) 17 g packet Take 17 g by mouth every other day.   Yes [provider]  ?potassium chloride (KLOR-CON) 10 MEQ tablet Take 10 mEq by mouth daily.   Yes [provider]  ?sertraline (ZOLOFT) 50 MG tablet Take 50 mg by mouth daily. 10/23/20  Yes [provider]  ?traMADol (ULTRAM) 50 MG tablet Take 1 tablet (50 mg total) by mouth every 6 (six) hours as needed for moderate pain. 05/01/21  Yes Reche Dixon, PA-C  ?traZODone (DESYREL) 50 MG tablet Take 75 mg by mouth at bedtime. 08/26/21  Yes [provider]  ?alum & mag hydroxide-simeth (MAALOX/MYLANTA) 200-200-20 MG/5ML suspension Take 30 mLs by mouth as needed for indigestion or heartburn. ?Patient not taking: Reported on 03/17/2022    [provider]  ?bismuth subsalicylate (PEPTO BISMOL) 262 MG/15ML suspension Take 262 mg by mouth every 2 (two) hours as needed for indigestion (up to 6 doses in 24 hr).  ?Patient not taking: Reported on 03/17/2022    [provider]  ?calcium carbonate (TUMS - DOSED IN MG ELEMENTAL CALCIUM) 500 MG chewable tablet Chew 2 tablets (400 mg of elemental calcium total) by mouth 3 (three) times daily as needed for indigestion or heartburn. ?Patient not taking: Reported on 03/17/2022 08/23/18   Loletha Grayer, MD  ?diphenoxylate-atropine (LOMOTIL) 2.5-0.025 MG tablet Take 1 tablet by mouth 2 (two) times daily as needed for diarrhea or loose stools. ?Patient not taking: Reported on 03/17/2022    [provider]  ?feeding supplement, ENSURE ENLIVE, (ENSURE ENLIVE) LIQD Take 237 mLs by mouth 2 (two) times daily between meals. ?Patient not taking: Reported on 03/17/2022 10/23/19   Jonetta Osgood, MD  ?fluticasone (FLONASE) 50 MCG/ACT nasal spray Place 1 spray into both nostrils daily. ?Patient not taking: Reported on 03/17/2022    [provider]  ?loperamide (IMODIUM A-D) 2 MG tablet Take 2 mg by mouth as needed  for diarrhea or loose stools (up to 4 doses in 12 hrs.). ?Patient not taking: Reported on 03/17/2022    [provider]  ?magnesium hydroxide (MILK OF MAGNESIA) 400 MG/5ML suspension Take 30 mLs by mouth daily as needed for mild constipation. ?Patient not taking: Reported on 03/17/2022    [provider]  ?melatonin 3 MG TABS tablet Take 3 mg by mouth at bedtime as needed.    [provider]  ?Multiple Vitamin (MULTIVITAMIN WITH MINERALS) TABS tablet Take 1 tablet by mouth daily. ?Patient not taking: Reported on 11/11/2021 08/23/18   Loletha Grayer, MD  ?nystatin (MYCOSTATIN/NYSTOP) powder Apply 1 g topically 2 (two) times daily as needed (to affected area(s)). ?Patient not  taking: Reported on 03/17/2022    [provider]  ?ondansetron (ZOFRAN) 4 MG tablet Take 4 mg by mouth every 8 (eight) hours as needed for nausea or vomiting. ?Patient not taking: Reported on 03/17/2022    [provider]  ?Pseudoephedrine-DM-GG (ROBITUSSIN CF PO) Take 10 mLs by mouth every 6 (six) hours as needed (cough). ?Patient not taking: Reported on 03/17/2022    [provider]  ?psyllium (METAMUCIL) 58.6 % powder Take 1 packet by mouth 2 (two) times a day. ?Patient not taking: Reported on 03/17/2022    [provider]  ?senna-docusate (SENOKOT-S) 8.6-50 MG tablet Take 1 tablet by mouth 2 (two) times daily. ?Patient not taking: Reported on 03/17/2022 05/04/21   Gwynne Edinger, MD  ?sodium phosphate Pediatric (FLEET) 3.5-9.5 GM/59ML enema Place 1 enema rectally once as needed for severe constipation. ?Patient not taking: Reported on 03/17/2022    [provider]  ? ?Review of Systems  ?Constitutional:  Positive for fatigue. Negative for appetite change.  ?HENT:  Positive for rhinorrhea. Negative for congestion and sore throat.   ?Eyes: Negative.   ?Respiratory:  Positive for shortness of breath. Negative for cough.   ?Cardiovascular:  Negative for chest pain, palpitations  and leg swelling.  ?Gastrointestinal:  Positive for nausea and vomiting. Negative for abdominal distention and abdominal pain.  ?Endocrine: Negative.   ?Genitourinary: Negative.   ?Musculoskeletal:  Positiv

## 2022-03-17 ENCOUNTER — Encounter: Payer: Self-pay | Admitting: Family

## 2022-03-17 ENCOUNTER — Ambulatory Visit: Payer: Medicare Other | Attending: Family | Admitting: Family

## 2022-03-17 VITALS — BP 138/73 | HR 90 | Resp 18 | Ht 65.0 in

## 2022-03-17 DIAGNOSIS — Z515 Encounter for palliative care: Secondary | ICD-10-CM | POA: Insufficient documentation

## 2022-03-17 DIAGNOSIS — J3489 Other specified disorders of nose and nasal sinuses: Secondary | ICD-10-CM | POA: Diagnosis not present

## 2022-03-17 DIAGNOSIS — K219 Gastro-esophageal reflux disease without esophagitis: Secondary | ICD-10-CM | POA: Insufficient documentation

## 2022-03-17 DIAGNOSIS — R112 Nausea with vomiting, unspecified: Secondary | ICD-10-CM | POA: Diagnosis not present

## 2022-03-17 DIAGNOSIS — I5032 Chronic diastolic (congestive) heart failure: Secondary | ICD-10-CM | POA: Diagnosis present

## 2022-03-17 DIAGNOSIS — E785 Hyperlipidemia, unspecified: Secondary | ICD-10-CM | POA: Diagnosis not present

## 2022-03-17 DIAGNOSIS — I110 Hypertensive heart disease with heart failure: Secondary | ICD-10-CM | POA: Diagnosis not present

## 2022-03-17 DIAGNOSIS — J449 Chronic obstructive pulmonary disease, unspecified: Secondary | ICD-10-CM | POA: Insufficient documentation

## 2022-03-17 DIAGNOSIS — F0394 Unspecified dementia, unspecified severity, with anxiety: Secondary | ICD-10-CM | POA: Insufficient documentation

## 2022-03-17 DIAGNOSIS — I10 Essential (primary) hypertension: Secondary | ICD-10-CM

## 2022-03-17 DIAGNOSIS — E119 Type 2 diabetes mellitus without complications: Secondary | ICD-10-CM | POA: Diagnosis not present

## 2022-03-17 DIAGNOSIS — Z833 Family history of diabetes mellitus: Secondary | ICD-10-CM | POA: Insufficient documentation

## 2022-03-17 NOTE — Patient Instructions (Addendum)
Continue weighing daily and call for an overnight weight gain of 3 pounds or more or a weekly weight gain of more than 5 pounds. ? ? ?Put compression socks on every morning with removal at bedtime. ? ? ?Please give something for a runny nose per patient request ?

## 2022-04-12 ENCOUNTER — Non-Acute Institutional Stay: Payer: Medicare Other | Admitting: Nurse Practitioner

## 2022-04-12 ENCOUNTER — Encounter: Payer: Self-pay | Admitting: Nurse Practitioner

## 2022-04-12 VITALS — BP 155/70 | HR 78 | Temp 97.5°F | Resp 18 | Wt 152.0 lb

## 2022-04-12 DIAGNOSIS — R5381 Other malaise: Secondary | ICD-10-CM

## 2022-04-12 DIAGNOSIS — Z515 Encounter for palliative care: Secondary | ICD-10-CM

## 2022-04-12 DIAGNOSIS — R0602 Shortness of breath: Secondary | ICD-10-CM

## 2022-04-12 DIAGNOSIS — I5032 Chronic diastolic (congestive) heart failure: Secondary | ICD-10-CM

## 2022-04-12 NOTE — Progress Notes (Signed)
[facility] Consult Note Telephone: [phone]  Fax: [phone]    Date of encounter: 04/12/22 8:04 PM PATIENT NAME: [name] [id number]   [phone] (home)  DOB: 10/04/1932 MRN: [id number] PRIMARY CARE PROVIDER:    [place]:    Contact Information     Name Relation Home Work Mobile   [place] Son [phone]  [phone]      I met face to face with patient and family in facility. Palliative Care was asked to follow this patient by consultation request of  [place] to address advance care planning and complex medical decision making. This is a follow up visit.                                  ASSESSMENT AND PLAN / RECOMMENDATIONS:  1. Advance Care Planning;  DNR   2. Shortness of breath secondary to CHF; stable today; Continuous O2, inhalation therapy, monitor respiratory status, Monitor edema   12/09/2021 weight 169.7 lbs 02/03/2022 weight 147.5 lbs 02/15/2022 weight 148 lbs   3. Debility secondary to CHF, obesity, continue encourage mobility, fall risk   4. Palliative care encounter; Palliative care encounter; Palliative medicine team will continue to support patient, patient's family, and medical team. Visit consisted of counseling and education dealing with the complex and emotionally intense issues of symptom management and palliative care in the setting of serious and potentially life-threatening illness   Follow up Palliative Care Visit: Palliative care will continue to follow for complex medical decision making, advance care planning, and clarification of goals. Return 4 weeks or prn.   I spent 46 minutes providing this consultation started at 1:15pm. More than 50% of the time in this consultation was spent in counseling and care coordination. PPS: 40% Chief Complaint: Follow up palliative consult for complex medical  decision making   HISTORY OF PRESENT ILLNESS:  [name] is a 88 y.o. 88 y.o. female  with multiple medical problems including dementia, chronic diastolic CHF, LVH, bradycardia, persistent a-fib, HTN, type 2 diabetes, HLD, bilateral leg edema, anemia, anxiety,COPD, CKD, GERD, DM, HLD, obesity, depression. [name] resides at [place] at [facility]. [name] requires assistance with mobility, transfers, adl's including bathing, dressing. [name] feeds herself with fair appetite per staff. [name] is O2 dependent. At    History obtained from review of EMR, discussion with primary team, and interview with family, facility staff/caregiver and/or [name].  I reviewed available labs, medications, imaging, studies and related documents from the EMR.  Records reviewed and summarized above.    History obtained from review of EMR, discussion facility staff and [name].  I reviewed available labs, medications, imaging, studies and related documents from the EMR.  Records reviewed and summarized above.    ROS 10 point system reviewed all negative except HPI   Physical Exam: Constitutional: NAD General: frail appearing, chronically ill, pleasant female EYES:  lids intact ENMT: oral mucous membranes moist, CV: S1S2, RRR Pulmonary: clear, decrease bases, no increased work of breathing, O2 Abdomen: normo-active BS + 4 quadrants, soft and non tender MSK: w/c dependent Skin: warm and dry Neuro:  + generalized weakness,  + cognitive impairment Psych: non-anxious affect, A and Oriented Thank you for the opportunity to participate in the care of [name].  The palliative care team will continue to follow.  Please call our office at [phone] if we can be of additional assistance.   Dink Creps Ihor Gully, NP

## 2022-04-23 ENCOUNTER — Encounter: Payer: Self-pay | Admitting: Nurse Practitioner

## 2022-04-23 ENCOUNTER — Non-Acute Institutional Stay: Payer: Medicare Other | Admitting: Nurse Practitioner

## 2022-04-23 VITALS — BP 155/70 | HR 78 | Temp 97.5°F | Resp 18 | Wt 141.3 lb

## 2022-04-23 DIAGNOSIS — Z515 Encounter for palliative care: Secondary | ICD-10-CM

## 2022-04-23 DIAGNOSIS — R451 Restlessness and agitation: Secondary | ICD-10-CM

## 2022-04-23 DIAGNOSIS — R0602 Shortness of breath: Secondary | ICD-10-CM

## 2022-04-23 DIAGNOSIS — I5032 Chronic diastolic (congestive) heart failure: Secondary | ICD-10-CM

## 2022-04-23 DIAGNOSIS — R5381 Other malaise: Secondary | ICD-10-CM

## 2022-04-23 NOTE — Progress Notes (Signed)
[place] [facility] Consult Note Telephone: [phone]  Fax: [phone]    Date of encounter: 04/23/22 4:03 PM PATIENT NAME: [name] [id number]   [phone] (home)  DOB: [birth year] MRN: [id number] PRIMARY CARE PROVIDER:    [place]:    Contact Information     Name Relation Home Work Mobile   [place] Son [phone]  [phone]      I met face to face with patient in facility. Palliative Care was asked to follow this patient by consultation request of  [place] to address advance care planning and complex medical decision making. This is a follow up visit.                                  ASSESSMENT AND PLAN / RECOMMENDATIONS:  Symptom Management/Plan: 1. Advance Care Planning;  DNR   2. Shortness of breath secondary to CHF; stable today; Continuous O2, inhalation therapy, monitor respiratory status, Monitor edema   12/09/2021 weight 169.7 lbs 02/03/2022 weight 147.5 lbs 02/15/2022 weight 148 lbs 04/15/2022 weight 141.3 lbs 28.4 lbs/18 weeks; 16.74%  3. Debility secondary to CHF, obesity, continue encourage mobility, fall risk  4. Agitation secondary to debility, frustration with loss of independence, discussed and coping strategies recommended. Continue with psychotherapy with psychiatry; Continue sertraline, depakote, buspar, trazodone.   5. Palliative care encounter; Palliative care encounter; Palliative medicine team will continue to support patient, patient's family, and medical team. Visit consisted of counseling and education dealing with the complex and emotionally intense issues of symptom management and palliative care in the setting of serious and potentially life-threatening illness   Follow up Palliative Care Visit: Palliative care will continue to follow for complex medical decision making, advance care planning, and  clarification of goals. Return 4 weeks or prn.   I spent 47 minutes providing this consultation started at 10:30am. More than 50% of the time in this consultation was spent in counseling and care coordination. PPS: 40% Chief Complaint: Follow up palliative consult for complex medical decision making   HISTORY OF PRESENT ILLNESS:  [name] is a 88 y.o. year old female  with multiple medical problems including dementia, chronic diastolic CHF, LVH, bradycardia, persistent a-fib, HTN, type 2 diabetes, HLD, bilateral leg edema, anemia, anxiety,COPD, CKD, GERD, DM, HLD, obesity, depression. Ms. Livingston resides at [place] at [facility]. Ms. Selvage requires assistance with mobility, transfers, adl's including bathing, dressing. Ms. Krawiec feeds herself, decreased appetite, significant weight loss. Staff endorses no recent falls, hospitalizations. Staff endorses increase in agitation, she made her room-mate cry. Redirectable though with behaviors. Ms. Agudelo is O2 dependent. At present Ms. Fortenberry is sitting in the w/c in her room, not wearing her O2, assisted putting it back on. We talked about purpose of pc visit. We talked about symptoms, ros, functional abilities. We talked about importance of wearing her O2. We talked about fall precautions. We talked about agitation, frustration which Ms. Farrelly is feeling, explored, support provided with coping strategies. We talked about daily routine, quality of life. We talked about decrease appetite, foods she likes and weigh loss, though limited with mild cognitive impairment. Medical goals reviewed. Discussed with staff, updated Jackey Loge NP of weight loss, may benefit from hospice services though have not been able to get to speak with the son, multiple messages left with no return call.  Will continue to try.    History obtained from review of EMR, discussion with primary team, and interview with family, facility staff/caregiver and/or [name].  I reviewed available labs,  medications, imaging, studies and related documents from the EMR.  Records reviewed and summarized above.    History obtained from review of EMR, discussion facility staff and [name].  I reviewed available labs, medications, imaging, studies and related documents from the EMR.  Records reviewed and summarized above.    ROS 10 point system reviewed all negative except HPI   Physical Exam: Constitutional: NAD General: frail appearing, chronically ill, pleasant female EYES:  lids intact ENMT: oral mucous membranes moist, CV: S1S2, RRR Pulmonary: clear, decrease bases, no increased work of breathing, O2 Abdomen: normo-active BS + 4 quadrants, soft and non tender MSK: w/c dependent Skin: warm and dry Neuro:  + generalized weakness,  + cognitive impairment Psych: non-anxious affect, A and Oriented Thank you for the opportunity to participate in the care of [name].  The palliative care team will continue to follow. Please call our office at [phone] if we can be of additional assistance.   Zaion Hreha Ihor Gully, NP

## 2022-04-28 DIAGNOSIS — Z23 Encounter for immunization: Secondary | ICD-10-CM

## 2022-05-14 ENCOUNTER — Encounter: Payer: Self-pay | Admitting: Nurse Practitioner

## 2022-05-14 ENCOUNTER — Non-Acute Institutional Stay: Payer: Medicare Other | Admitting: Nurse Practitioner

## 2022-05-14 VITALS — BP 155/70 | HR 78 | Temp 97.9°F | Resp 18 | Wt 142.5 lb

## 2022-05-14 DIAGNOSIS — R451 Restlessness and agitation: Secondary | ICD-10-CM

## 2022-05-14 DIAGNOSIS — R5381 Other malaise: Secondary | ICD-10-CM

## 2022-05-14 DIAGNOSIS — R0602 Shortness of breath: Secondary | ICD-10-CM

## 2022-05-14 DIAGNOSIS — I5032 Chronic diastolic (congestive) heart failure: Secondary | ICD-10-CM

## 2022-05-14 DIAGNOSIS — Z515 Encounter for palliative care: Secondary | ICD-10-CM

## 2022-05-14 NOTE — Progress Notes (Signed)
[place] Consult Note Telephone: [phone]  Fax: [phone]    Date of encounter: 05/14/22 2:03 PM PATIENT NAME: [name] [id number]   [phone] (home)  DOB: [birth year] MRN: [id number] PRIMARY CARE PROVIDER:    [place]:    Contact Information     Name Relation Home Work Mobile   [place] Son [phone]  [phone]     I met face to face with patient in facility. Palliative Care was asked to follow this patient by consultation request of  [place] to address advance care planning and complex medical decision making. This is a follow up visit.                                  ASSESSMENT AND PLAN / RECOMMENDATIONS:  Symptom Management/Plan: 1. Advance Care Planning;  DNR   2. Shortness of breath secondary to CHF; stable today; Continuous O2, inhalation therapy, monitor respiratory status, Monitor edema   12/09/2021 weight 169.7 lbs 02/03/2022 weight 147.5 lbs 02/15/2022 weight 148 lbs 04/15/2022 weight 141.3 lbs 05/10/2022 weight 142.5 lbs   3. Debility declining; secondary to CHF, obesity, continue encourage mobility, fall risk   4. Agitation secondary to debility, improved, re-visited coping strategies recommended. Continue with psychotherapy with psychiatry; Continue sertraline, depakote, buspar, trazodone.   5. Palliative care encounter; Palliative care encounter; Palliative medicine team will continue to support patient, patient's family, and medical team. Visit consisted of counseling and education dealing with the complex and emotionally intense issues of symptom management and palliative care in the setting of serious and potentially life-threatening illness   Follow up Palliative Care Visit: Palliative care will continue to follow for complex medical decision making, advance care planning, and clarification of goals. Return 4  weeks or prn.   I spent 63 minutes providing this consultation started at 12:00 pm. More than 50% of the time in this consultation was spent in counseling and care coordination. PPS: 40% Chief Complaint: Follow up palliative consult for complex medical decision making   HISTORY OF PRESENT ILLNESS:  [name] is a 88 y.o. year old female  with multiple medical problems including dementia, chronic diastolic CHF, LVH, bradycardia, persistent a-fib, HTN, type 2 diabetes, HLD, bilateral leg edema, anemia, anxiety,COPD, CKD, GERD, DM, HLD, obesity, depression. [name] resides at [place] [facility]. [name] requires assistance with mobility, transfers, adl's including bathing, dressing. [name] feeds herself, decreased appetite, significant weight loss.  [name] is O2 dependent. At present [name] is lying in bed, appears debilitated, chronically ill, thin, no visitors present. We talked about purpose of pc visit. We talked about symptoms, ros, functional abilities. We talked about importance of wearing her O2. We talked about fall precautions. We talked about daily routine, quality of life. We talked about decrease appetite, foods she likes and weigh loss, though limited with mild cognitive impairment. I called Ms [name] son, [name], clinical update discussed. We talked about purpose PC visit, overall decline within chronic disease progression, talked about symptoms, residing at facility, discussed option of hospice though at this time [name] wishes to continue to have [name] go back and forth for hospitalization as needed, wished to stay on pc. Discussed role pc, medical goals reviewed. No new changes to poc. Updated staff. Questions answered.   History obtained from review of EMR, discussion  with primary team, and interview with family, facility staff/caregiver and/or [name].  I reviewed available labs, medications, imaging, studies and related documents from the EMR.  Records reviewed and summarized  above.    History obtained from review of EMR, discussion facility staff and [name].  I reviewed available labs, medications, imaging, studies and related documents from the EMR.  Records reviewed and summarized above.    ROS 10 point system reviewed all negative except HPI   Physical Exam: Constitutional: NAD General: frail appearing, chronically ill, pleasant female EYES:  lids intact ENMT: oral mucous membranes moist, CV: S1S2, RRR Pulmonary: clear, decrease bases, no increased work of breathing, O2 Abdomen: normo-active BS + 4 quadrants, soft and non tender MSK: w/c dependent Skin: warm and dry Neuro:  + generalized weakness,  + cognitive impairment Psych: non-anxious affect, A and Oriented  Thank you for the opportunity to participate in the care of [name].  The palliative care team will continue to follow. Please call our office at [phone] if we can be of additional assistance.   Virdie Penning Ihor Gully, NP

## 2022-05-24 ENCOUNTER — Encounter: Payer: Self-pay | Admitting: Nurse Practitioner

## 2022-05-24 ENCOUNTER — Non-Acute Institutional Stay: Payer: Medicare Other | Admitting: Nurse Practitioner

## 2022-05-24 VITALS — BP 155/70 | HR 78 | Temp 97.9°F | Resp 18 | Wt 137.0 lb

## 2022-05-24 DIAGNOSIS — R634 Abnormal weight loss: Secondary | ICD-10-CM

## 2022-05-24 DIAGNOSIS — R630 Anorexia: Secondary | ICD-10-CM

## 2022-05-24 DIAGNOSIS — I5032 Chronic diastolic (congestive) heart failure: Secondary | ICD-10-CM

## 2022-05-24 DIAGNOSIS — R5381 Other malaise: Secondary | ICD-10-CM

## 2022-05-24 DIAGNOSIS — Z515 Encounter for palliative care: Secondary | ICD-10-CM

## 2022-05-24 NOTE — Progress Notes (Signed)
[facility] Consult Note Telephone: [phone]  Fax: [phone]    Date of encounter: 05/24/22 8:07 PM PATIENT NAME: [name] [id number]   [phone] (home)  DOB: May 03, 1932 MRN: [id number] PRIMARY CARE PROVIDER:    Kirk Ruths, MD,  [place] [address] [phone]  REFERRING PROVIDER:   Kirk Ruths, MD [facility] [place] [facility] [place],  [place] [address] [phone]  RESPONSIBLE PARTY:    Contact Information     Name Relation Home Work Mobile   [name] Son [phone]  [phone]          I met face to face with patient in facility. Palliative Care was asked to follow this patient by consultation request of  [place] to address advance care planning and complex medical decision making. This is a follow up visit.                                  ASSESSMENT AND PLAN / RECOMMENDATIONS:  Symptom Management/Plan: 1. Advance Care Planning;  DNR; declined hospice, wishes to stay PC   2. Anorexia with weight loss, continue to monitor weights, supplement, encourage [name] to eat, supportive 12/09/2021 weight 169.7 lbs 02/03/2022 weight 147.5 lbs 02/15/2022 weight 148 lbs 04/15/2022 weight 141.3 lbs 05/10/2022 weight 142.5 lbs 05/17/2022 weight 137 lbs 5.5 lbs weight loss;  3. Debility declining; secondary to CHF, obesity, continue encourage mobility, fall risk   4.  Palliative care encounter; Palliative care encounter; Palliative medicine team will continue to support patient, patient's family, and medical team. Visit consisted of counseling and education dealing with the complex and emotionally intense issues of symptom management and palliative care in the setting of serious and potentially life-threatening illness   Follow up Palliative Care Visit: Palliative care will continue to  follow for complex medical decision making, advance care planning, and clarification of goals. Return 4 weeks or prn.   I spent 37 minutes providing this consultation started at 12:50 pm. More than 50% of the time in this consultation was spent in counseling and care coordination. PPS: 40% Chief Complaint: Follow up palliative consult for complex medical decision making   HISTORY OF PRESENT ILLNESS:  [name] is a 88 y.o. year old female  with multiple medical problems including dementia, chronic diastolic CHF, LVH, bradycardia, persistent a-fib, HTN, type 2 diabetes, HLD, bilateral leg edema, anemia, anxiety,COPD, CKD, GERD, DM, HLD, obesity, depression. [name] resides at [place] [facility]. [name] requires assistance with mobility, transfers, adl's including bathing, dressing. [name] feeds herself, decreased appetite, significant weight loss.  [name] is O2 dependent. At present [name] is sitting on the side of the bed, feeding herself lunch, appears debilitated, chronically ill, thin, no visitors present. We talked about purpose of pc visit. We talked about symptoms, ros, functional limitations. We talked about decrease appetite, foods she likes and weigh loss, though limited with mild cognitive impairment. [name] was interactive today, asking and answering questions. [name] was cooperative with assessment. Limited discussion with cognitive impairment. I attempted to call Ms [name] son, [name], unable to leave a message,  medical goals reviewed. No new changes to poc. Updated staff. Questions answered.   History obtained from review of EMR, discussion with primary team, and interview with family,  facility staff/caregiver and/or [name].  I reviewed available labs, medications, imaging, studies and related documents from the EMR.  Records reviewed and summarized above.    History obtained from review of EMR, discussion facility staff and [name].  I reviewed available labs,  medications, imaging, studies and related documents from the EMR.  Records reviewed and summarized above.    ROS 10 point system reviewed all negative except HPI   Physical Exam: Constitutional: NAD General: frail appearing, chronically ill, pleasant female EYES:  lids intact ENMT: oral mucous membranes moist, CV: S1S2, RRR Pulmonary: clear, decrease bases, no increased work of breathing, O2 Abdomen: normo-active BS + 4 quadrants, soft and non tender MSK: w/c dependent Skin: warm and dry Neuro:  + generalized weakness,  + cognitive impairment Psych: non-anxious affect, A and Oriented   Thank you for the opportunity to participate in the care of [name].  The palliative care team will continue to follow. Please call our office at [phone] if we can be of additional assistance.   Thank you for the opportunity to participate in the care of [name].  The palliative care team will continue to follow. Please call our office at [phone] if we can be of additional assistance.   Dodger Sinning Ihor Gully, NP

## 2022-06-14 ENCOUNTER — Encounter: Payer: Self-pay | Admitting: Nurse Practitioner

## 2022-06-14 ENCOUNTER — Non-Acute Institutional Stay: Payer: Medicare Other | Admitting: Nurse Practitioner

## 2022-06-14 VITALS — BP 149/74 | HR 91 | Temp 97.1°F | Resp 18 | Wt 139.8 lb

## 2022-06-14 DIAGNOSIS — Z515 Encounter for palliative care: Secondary | ICD-10-CM

## 2022-06-14 DIAGNOSIS — R5381 Other malaise: Secondary | ICD-10-CM

## 2022-06-14 DIAGNOSIS — R630 Anorexia: Secondary | ICD-10-CM

## 2022-06-14 DIAGNOSIS — R0602 Shortness of breath: Secondary | ICD-10-CM

## 2022-06-14 NOTE — Progress Notes (Signed)
  [facility] Consult Note Telephone: [phone]  Fax: [phone]    Date of encounter: 06/14/22 2:49 PM PATIENT NAME: [name] [address] [place] [address]   [phone] (home)  DOB: 11/23/1931 MRN: [id number] PRIMARY CARE PROVIDER:    [facility]  RESPONSIBLE PARTY:    Contact Information     Name Relation Home Work Mobile   [place],Barry POA Son [phone]  [phone]      I met face to face with patient in facility. Palliative Care was asked to follow this patient by consultation request of  [facility] to address advance care planning and complex medical decision making. This is a follow up visit.                                  ASSESSMENT AND PLAN / RECOMMENDATIONS:  Symptom Management/Plan: 1. Advance Care Planning;  DNR; declined hospice, wishes to stay PC   2. Anorexia with weight loss, continue to monitor weights, supplement, encourage Ms. Reller to eat, supportive 12/09/2021 weight 169.7 lbs 02/03/2022 weight 147.5 lbs 02/15/2022 weight 148 lbs 04/15/2022 weight 141.3 lbs 05/10/2022 weight 142.5 lbs 05/17/2022 weight 137 lbs 06/10/2022 weight 139.8 lbs 3. Debility declining; secondary to CHF, obesity, continue encourage mobility, fall risk   4.  Palliative care encounter; Palliative care encounter; Palliative medicine team will continue to support patient, patient's family, and medical team. Visit consisted of counseling and education dealing with the complex and emotionally intense issues of symptom management and palliative care in the setting of serious and potentially life-threatening illness   Follow up Palliative Care Visit: Palliative care will continue to follow for complex medical decision making, advance care planning, and clarification of goals. Return 4 weeks or prn.   I spent 47 minutes providing this consultation started at 3:15 pm. More than 50% of the time in this  consultation was spent in counseling and care coordination. PPS: 40% Chief Complaint: Follow up palliative consult for complex medical decision making   HISTORY OF PRESENT ILLNESS:  [name] is a 88 y.o. year old female  with multiple medical problems including dementia, chronic diastolic CHF, LVH, bradycardia, persistent a-fib, HTN, type 2 diabetes, HLD, bilateral leg edema, anemia, anxiety,COPD, CKD, GERD, DM, HLD, obesity, depression. [name] resides at [facility]. [name] requires assistance with mobility, transfers, adl's including bathing, dressing. [name] feeds herself, decreased appetite, significant weight loss.  [name] is O2 dependent. At present [name] is sitting in the w/c in her new room, appears debilitated, chronically ill, thin, no visitors present. We talked about purpose of pc visit. We talked about symptoms, ros, functional limitations. We talked about appetite, with the choice of her snacks on the bedside table, though limited with mild cognitive impairment. [name] was interactive today, asking and answering questions. [name] talked about moving to her new room, "I maybe moved again, I am not sure if I will get along with this roommate, she locked me out". [name] was cooperative with assessment. Limited discussion with cognitive impairment. I called Ms [place] son, Barry, clinical update discussed including sodium, diet, bringing food in from outside the facility, things Ms. Newey likes to eat. We talked about medical goals reviewed, role pc, poc. No new changes to poc. Updated staff. Questions answered.   History obtained from review of EMR, discussion with primary team,   and interview with family, facility staff/caregiver and/or [name].  I reviewed available labs, medications, imaging, studies and related documents from the EMR.  Records reviewed and summarized above.    History obtained from review of EMR, discussion facility staff and [name].  I  reviewed available labs, medications, imaging, studies and related documents from the EMR.  Records reviewed and summarized above.    ROS 10 point system reviewed all negative except HPI   Physical Exam: Constitutional: NAD General: frail appearing, chronically ill, pleasant female EYES:  lids intact ENMT: oral mucous membranes moist, CV: S1S2, RRR Pulmonary: clear, decrease bases, no increased work of breathing, O2 Abdomen: normo-active BS + 4 quadrants, soft and non tender MSK: w/c dependent Skin: warm and dry Neuro:  + generalized weakness,  + cognitive impairment Psych: non-anxious affect, A and Oriented Thank you for the opportunity to participate in the care of [name].  The palliative care team will continue to follow. Please call our office at [phone] if we can be of additional assistance.    Z , NP  

## 2022-07-02 ENCOUNTER — Non-Acute Institutional Stay: Payer: Medicare Other | Admitting: Nurse Practitioner

## 2022-07-02 ENCOUNTER — Encounter: Payer: Self-pay | Admitting: Nurse Practitioner

## 2022-07-02 VITALS — BP 130/66 | HR 70 | Temp 97.8°F | Resp 20 | Wt 139.8 lb

## 2022-07-02 DIAGNOSIS — R630 Anorexia: Secondary | ICD-10-CM

## 2022-07-02 DIAGNOSIS — Z515 Encounter for palliative care: Secondary | ICD-10-CM

## 2022-07-02 DIAGNOSIS — R0602 Shortness of breath: Secondary | ICD-10-CM

## 2022-07-02 DIAGNOSIS — R5381 Other malaise: Secondary | ICD-10-CM

## 2022-07-02 NOTE — Progress Notes (Signed)
[facility] Consult Note Telephone: [phone]  Fax: [phone]    Date of encounter: 07/02/22 6:55 PM PATIENT NAME: [name] [id number]   [phone] (home)  DOB: 1932-05-07 MRN: [id number] PRIMARY CARE PROVIDER:    [place]:    Contact Information     Name Relation Home Work Mobile   [place] [name] [phone]  [phone]          I met face to face with patient in facility. Palliative Care was asked to follow this patient by consultation request of  [place] to address advance care planning and complex medical decision making. This is a follow up visit.                                  ASSESSMENT AND PLAN / RECOMMENDATIONS:  Symptom Management/Plan: 1. Advance Care Planning;  DNR;  2. Anorexia with weight loss, continue to monitor weights, supplement, encourage [name] to eat, supportive 12/09/2021 weight 169.7 lbs 02/03/2022 weight 147.5 lbs 02/15/2022 weight 148 lbs 04/15/2022 weight 141.3 lbs 05/10/2022 weight 142.5 lbs 05/17/2022 weight 137 lbs 06/10/2022 weight 139.8 lbs 3. Debility declining; secondary to CHF, obesity, continue encourage mobility, fall risk   4.  Palliative care encounter; Palliative care encounter; Palliative medicine team will continue to support patient, patient's family, and medical team. Visit consisted of counseling and education dealing with the complex and emotionally intense issues of symptom management and palliative care in the setting of serious and potentially life-threatening illness   Follow up Palliative Care Visit: Palliative care will continue to follow for complex medical decision making, advance care planning, and clarification of goals. Return 1 weeks or prn.   I spent 45 minutes providing this consultation started at 10:45 am. More than 50% of the time in this consultation was spent in  counseling and care coordination. PPS: 40% Chief Complaint: Follow up palliative consult for complex medical decision making   HISTORY OF PRESENT ILLNESS:  [name] is a 88 y.o. year old female  with multiple medical problems including dementia, chronic diastolic CHF, LVH, bradycardia, persistent a-fib, HTN, type 2 diabetes, HLD, bilateral leg edema, anemia, anxiety,COPD, CKD, GERD, DM, HLD, obesity, depression. [name] resides at [place] at [facility]. [name] requires assistance with mobility, transfers, adl's including bathing, dressing. [name] feeds herself, decreased appetite, significant weight loss.  [name] is O2 dependent. At present [name] is sitting lying in her recliner. We talked about ros, symptoms including pain, shortness of breath, appetite, nutrition the snacks on her bedside table. We talked about her O2 supplement. We talked about sitting in the recliner. [name] talked about her roommate. Limited with cognitive impairment, cooperative with assessment. Attempted to contact [name] [name], message left as [name] has a pending hospice referral though hospice has not been able to get in touch with [name] to schedule admission visit. Visit with [name], assessed ros, sob, pain, sleep, appetite, support provided. Will continue to try to reach [name]. I updated staff and [facility] NP. Medical goals reviewed. Continue current plan, will try to reach [name] [name] for further discussion of goc and if wishes are to proceed with hospice services at the facility. Continue current plan.    History obtained from review of EMR, discussion with primary team, and interview  with family, facility staff/caregiver and/or [name].  I reviewed available labs, medications, imaging, studies and related documents from the EMR.  Records reviewed and summarized above.    History obtained from review of EMR, discussion facility staff and [name].  I reviewed available labs, medications,  imaging, studies and related documents from the EMR.  Records reviewed and summarized above.    ROS 10 point system reviewed all negative except HPI   Physical Exam: Constitutional: NAD General: frail appearing, chronically ill, pleasant female EYES:  lids intact ENMT: oral mucous membranes moist, CV: S1S2, RRR Pulmonary: decrease throughout; O2 Abdomen: normo-active BS + 4 quadrants, soft and non tender MSK: w/c dependent Skin: warm and dry Neuro:  + generalized weakness,  + cognitive impairment Psych: non-anxious affect, Alert, appears tired, forgetfulness  Thank you for the opportunity to participate in the care of [name].  The palliative care team will continue to follow. Please call our office at [phone] if we can be of additional assistance.   Rebekah Sprinkle Ihor Gully, NP

## 2022-07-26 ENCOUNTER — Encounter: Payer: Self-pay | Admitting: Nurse Practitioner

## 2022-07-26 ENCOUNTER — Non-Acute Institutional Stay: Payer: Medicare Other | Admitting: Nurse Practitioner

## 2022-07-26 VITALS — BP 110/60 | HR 80 | Temp 97.7°F | Resp 20 | Wt 137.2 lb

## 2022-07-26 DIAGNOSIS — R630 Anorexia: Secondary | ICD-10-CM

## 2022-07-26 DIAGNOSIS — R634 Abnormal weight loss: Secondary | ICD-10-CM

## 2022-07-26 DIAGNOSIS — R0602 Shortness of breath: Secondary | ICD-10-CM

## 2022-07-26 DIAGNOSIS — Z515 Encounter for palliative care: Secondary | ICD-10-CM

## 2022-07-26 DIAGNOSIS — R5381 Other malaise: Secondary | ICD-10-CM

## 2022-07-26 NOTE — Progress Notes (Signed)
[facility] Consult Note Telephone: [phone]  Fax: [phone]    Date of encounter: 07/26/22 1:25 PM PATIENT NAME: [place] [address]   [phone] (home)  DOB: 09-06-1932 MRN: [id number] PRIMARY CARE PROVIDER:    [place]:    Contact Information     Name Relation Home Work Mobile   [place] Son [phone]  [phone]                I met face to face with patient in facility. Palliative Care was asked to follow this patient by consultation request of  [place] to address advance care planning and complex medical decision making. This is a follow up visit.                                  ASSESSMENT AND PLAN / RECOMMENDATIONS:  Symptom Management/Plan: 1. Advance Care Planning;  DNR;  2. Anorexia with weight loss, continue to monitor weights, supplement, encourage [name] to eat, supportive 12/09/2021 weight 169.7 lbs 02/03/2022 weight 147.5 lbs 02/15/2022 weight 148 lbs 04/15/2022 weight 141.3 lbs 05/10/2022 weight 142.5 lbs 05/17/2022 weight 137 lbs 06/10/2022 weight 139.8 lbs 07/15/2022 weight 137 lbs 3. Debility with shortness of breath declining; secondary to CHF, COPD, continue encourage mobility, fall risk   4.  Palliative care encounter; Palliative care encounter; Palliative medicine team will continue to support patient, patient's family, and medical team. Visit consisted of counseling and education dealing with the complex and emotionally intense issues of symptom management and palliative care in the setting of serious and potentially life-threatening illness   Follow up Palliative Care Visit: Palliative care will continue to follow for complex medical decision making, advance care planning, and clarification of goals. Return 1 weeks or prn.   I spent 48 minutes providing this consultation started at 12:30 pm. More than  50% of the time in this consultation was spent in counseling and care coordination. PPS: 40% Chief Complaint: Follow up palliative consult for complex medical decision making   HISTORY OF PRESENT ILLNESS:  [name] is a 88 y.o. year old female  with multiple medical problems including dementia, chronic diastolic CHF, LVH, bradycardia, persistent a-fib, HTN, type 2 diabetes, HLD, bilateral leg edema, anemia, anxiety,COPD, CKD, GERD, DM, HLD, obesity, depression. [name] resides at [place] at [facility]. [name] requires assistance with mobility, transfers, adl's including bathing, dressing. [name] feeds herself, decreased appetite, significant weight loss.  [name] is O2 dependent. Staff endorses hospice came but did not admit [name]. At present [name] is lying sideways in her bed, adjusted for comfort. [name] appears debilitated, chronically ill, thin. [name] was able to answer simple questions though limited with cognitive impairment, cooperative with assessment, support provided. I contacted [name], Ms. Hewett's son, clinical update discussed, av visit with hospice, overall decline in the setting of natural aging and chronic disease. We talked about resources provided by hospice services under medicare benefit. We talked about functional and ros. We talked about challenges being a caregiver, only child HCPOA with decision making. [name] endorses he will think more about re-visiting option of hospice as now hospice has documentation to show decline which was not available at time of initial av visit. Emotional support provided. Updated staff.    History obtained from review of EMR, discussion with primary  team, and interview with family, facility staff/caregiver and/or [name].  I reviewed available labs, medications, imaging, studies and related documents from the EMR.  Records reviewed and summarized above.    History obtained from review of EMR, discussion facility staff and [name].   I reviewed available labs, medications, imaging, studies and related documents from the EMR.  Records reviewed and summarized above.    ROS 10 point system reviewed all negative except HPI   Physical Exam: Constitutional: NAD General: frail appearing, chronically ill, pleasant female EYES:  lids intact ENMT: oral mucous membranes moist, CV: S1S2, RRR Pulmonary: decrease throughout; O2 Abdomen: normo-active BS + 4 quadrants, soft and non tender MSK: w/c dependent Skin: warm and dry Neuro:  + generalized weakness,  + cognitive impairment Psych: non-anxious affect, Alert, appears tired, forgetfulness  Thank you for the opportunity to participate in the care of [name].  The palliative care team will continue to follow. Please call our office at [phone] if we can be of additional assistance.   Danuel Felicetti Ihor Gully, NP

## 2022-08-13 ENCOUNTER — Non-Acute Institutional Stay: Payer: Medicare Other | Admitting: Nurse Practitioner

## 2022-08-13 ENCOUNTER — Encounter: Payer: Self-pay | Admitting: Nurse Practitioner

## 2022-08-13 VITALS — BP 103/52 | HR 65 | Temp 97.3°F | Resp 18 | Wt 140.4 lb

## 2022-08-13 DIAGNOSIS — Z515 Encounter for palliative care: Secondary | ICD-10-CM

## 2022-08-13 DIAGNOSIS — I5032 Chronic diastolic (congestive) heart failure: Secondary | ICD-10-CM

## 2022-08-13 DIAGNOSIS — R5381 Other malaise: Secondary | ICD-10-CM

## 2022-08-13 DIAGNOSIS — R0602 Shortness of breath: Secondary | ICD-10-CM

## 2022-08-13 DIAGNOSIS — R630 Anorexia: Secondary | ICD-10-CM

## 2022-08-13 NOTE — Progress Notes (Signed)
[facility] Consult Note Telephone: [phone]  Fax: [phone]    Date of encounter: 08/13/22 12:47 PM PATIENT NAME: [name] [id number]   [phone] (home)  DOB: October 03, 1932 MRN: [id number] PRIMARY CARE PROVIDER:    [place]:    Contact Information     Name Relation Home Work Mobile   [place] Son [phone]  [phone]       I met face to face with patient in facility. Palliative Care was asked to follow this patient by consultation request of  [place] to address advance care planning and complex medical decision making. This is a follow up visit.                                  ASSESSMENT AND PLAN / RECOMMENDATIONS:  Symptom Management/Plan: 1. Advance Care Planning;  DNR; Re-visited hospice with [name], [name]'s son, will think about it through the weekend and revisit first part of next week. Discussed hospice services, support provided, questions answered. 2. Anorexia with weight loss, ongoing continue to monitor weights, supplement, encourage [name] to eat, supportive 12/09/2021 weight 169.7 lbs 02/03/2022 weight 147.5 lbs 02/15/2022 weight 148 lbs 04/15/2022 weight 141.3 lbs 05/10/2022 weight 142.5 lbs 05/17/2022 weight 137 lbs 06/10/2022 weight 139.8 lbs 07/15/2022 weight 137 lbs 08/11/2022 weight 140.4 lbs 3. Debility with shortness of breath ongoing secondary to CHF, COPD, continue encourage mobility, fall risk, monitor respiratory status; chronic disease progression   4.  Palliative care encounter; Palliative care encounter; Palliative medicine team will continue to support patient, patient's family, and medical team. Visit consisted of counseling and education dealing with the complex and emotionally intense issues of symptom management and palliative care in the setting of serious and potentially life-threatening illness    Follow up Palliative Care Visit: Palliative care will continue to follow for complex medical decision making, advance care planning, and clarification of goals. Return 1 weeks or prn.   I spent 46 minutes providing this consultation started at 10:45am. More than 50% of the time in this consultation was spent in counseling and care coordination. PPS: 40% Chief Complaint: Follow up palliative consult for complex medical decision making   HISTORY OF PRESENT ILLNESS:  [name] is a 88 y.o. year old female  with multiple medical problems including dementia, chronic diastolic CHF, LVH, bradycardia, persistent a-fib, HTN, type 2 diabetes, HLD, bilateral leg edema, anemia, anxiety,COPD, CKD, GERD, DM, HLD, obesity, depression. [name] resides at [place] at [facility]. [name] requires assistance with mobility, transfers, adl's including bathing, dressing. [name] feeds herself, decreased appetite, significant weight loss.  [name] is O2 dependent. Staff endorses continues to overall decline, debility. At present [name] is sitting in the w/c in her room, appears elderly, debilitated, chronically ill. [name] was confused, though oriented to self. [name] did not have her O2 on, reapplied. [name] was cooperative with assessment though limited interactions with cognitive impairment. Support provided. I called [name], [name]'s son, clinical update discussed, talked about medical goals, pc visit with Ms [name], discussed goc, option of hospice services. [name] wishes to continue to think about options and will revisit next week per [name] request. Support provided, updated staff.  History obtained from review of EMR, discussion with primary team, and interview with family, facility staff/caregiver and/or [name].  I reviewed available labs, medications, imaging, studies and related documents from the EMR.  Records reviewed and summarized above.    History obtained from review of EMR, discussion  facility staff and [name].  I reviewed available labs, medications, imaging, studies and related documents from the EMR.  Records reviewed and summarized above.    ROS 10 point system reviewed all negative except HPI   Physical Exam: Constitutional: NAD General: frail appearing, chronically ill, pleasant female EYES:  lids intact ENMT: oral mucous membranes moist, CV: S1S2, RRR Pulmonary: decrease throughout; O2 Abdomen: normo-active BS + 4 quadrants, soft and non tender MSK: w/c dependent Skin: warm and dry Neuro:  + generalized weakness,  + cognitive impairment Psych: non-anxious affect, Alert, appears tired, forgetfulness  Thank you for the opportunity to participate in the care of [name].  The palliative care team will continue to follow. Please call our office at [phone] if we can be of additional assistance.   Uzair Godley Ihor Gully, NP

## 2022-08-27 ENCOUNTER — Encounter: Payer: Self-pay | Admitting: Nurse Practitioner

## 2022-08-27 ENCOUNTER — Non-Acute Institutional Stay: Payer: Medicare Other | Admitting: Nurse Practitioner

## 2022-08-27 VITALS — BP 127/59 | HR 75 | Temp 97.7°F | Resp 18 | Wt 140.4 lb

## 2022-08-27 DIAGNOSIS — I5032 Chronic diastolic (congestive) heart failure: Secondary | ICD-10-CM

## 2022-08-27 DIAGNOSIS — R0602 Shortness of breath: Secondary | ICD-10-CM

## 2022-08-27 DIAGNOSIS — R634 Abnormal weight loss: Secondary | ICD-10-CM

## 2022-08-27 DIAGNOSIS — Z515 Encounter for palliative care: Secondary | ICD-10-CM

## 2022-08-27 DIAGNOSIS — R5381 Other malaise: Secondary | ICD-10-CM

## 2022-08-27 DIAGNOSIS — R630 Anorexia: Secondary | ICD-10-CM

## 2022-08-27 NOTE — Progress Notes (Signed)
[facility] Consult Note Telephone: [phone]  Fax: [phone]    Date of encounter: 08/27/22 11:49 AM PATIENT NAME: [name] [id number]   [phone] (home)  DOB: 05-24-1932 MRN: [id number] PRIMARY CARE PROVIDER:    [place]:    Contact Information     Name Relation Home Work Mobile   [place] Son [phone]  [phone]     I met face to face with patient in facility. Palliative Care was asked to follow this patient by consultation request of  [place] to address advance care planning and complex medical decision making. This is a follow up visit.                                  ASSESSMENT AND PLAN / RECOMMENDATIONS:  Symptom Management/Plan: 1. Advance Care Planning;  DNR; Re-visited hospice with [name], Ms. Pawlicki's son, will think about it through the weekend and revisit first part of next week. Discussed hospice services, support provided, questions answered. 2. Anorexia with weight loss, ongoing continue to monitor weights, supplement, encourage Ms. Tellefsen to eat, supportive 12/09/2021 weight 169.7 lbs 02/03/2022 weight 147.5 lbs 02/15/2022 weight 148 lbs 04/15/2022 weight 141.3 lbs 05/10/2022 weight 142.5 lbs 05/17/2022 weight 137 lbs 06/10/2022 weight 139.8 lbs 07/15/2022 weight 137 lbs 08/11/2022 weight 140.4 lbs  3. Debility with shortness of breath ongoing secondary to CHF, COPD, continue encourage mobility, fall risk, monitor respiratory status; chronic disease progression   4.  Palliative care encounter; Palliative care encounter; Palliative medicine team will continue to support patient, patient's family, and medical team. Visit consisted of counseling and education dealing with the complex and emotionally intense issues of symptom management and palliative care in the setting of serious and potentially life-threatening illness    Follow up Palliative Care Visit: Palliative care will continue to follow for complex medical decision making, advance care planning, and clarification of goals. Return 1 weeks or prn.   I spent 45 minutes providing this consultation started at 9:00 am. More than 50% of the time in this consultation was spent in counseling and care coordination. PPS: 40% Chief Complaint: Follow up palliative consult for complex medical decision making   HISTORY OF PRESENT ILLNESS:  [name] is a 88 y.o. year old female  with multiple medical problems including dementia, chronic diastolic CHF, LVH, bradycardia, persistent a-fib, HTN, type 2 diabetes, HLD, bilateral leg edema, anemia, anxiety,COPD, CKD, GERD, DM, HLD, obesity, depression. Ms. Wigle resides at [place] at [facility]. Ms. Dade requires assistance with mobility, transfers, adl's including bathing, dressing. Ms. [place] feeds herself, decreased appetite, significant weight loss.  Ms. Stetzer is O2 dependent. Staff endorses continues to overall decline, debility. At present Ms. Manera is sitting in the w/c in her room, appears elderly, debilitated, chronically ill. Ms. Oscarson endorses she is starting to feel better, cooperative with assessment, ros. We talked about her appetite, things she likes to eat. We talked about debility, importance of fall precautions, residing at facility though limited with cognitive impairment. Support provided. I attempted to contact [name], [name]'s son, message left with contact information, Medical goals reviewed. No new changes to poc. Updated staff.   History obtained from review of EMR, discussion with primary team, and interview with family, facility staff/caregiver and/or [name].  I reviewed available labs, medications, imaging, studies and related documents  from the EMR.  Records reviewed and summarized above.    History obtained from review of EMR, discussion facility staff and [name].  I reviewed available labs,  medications, imaging, studies and related documents from the EMR.  Records reviewed and summarized above.    ROS 10 point system reviewed all negative except HPI   Physical Exam: Constitutional: NAD General: frail appearing, chronically ill, pleasant female EYES:  lids intact ENMT: oral mucous membranes moist, CV: S1S2, RRR Pulmonary: decrease throughout; O2 Abdomen: normo-active BS + 4 quadrants, soft and non tender MSK: w/c dependent Skin: warm and dry Neuro:  + generalized weakness,  + cognitive impairment Psych: non-anxious affect, Alert, appears tired, forgetfulness Thank you for the opportunity to participate in the care of [name].  The palliative care team will continue to follow. Please call our office at [phone] if we can be of additional assistance.   Aman Batley Ihor Gully, NP

## 2022-09-10 ENCOUNTER — Non-Acute Institutional Stay: Payer: Medicare Other | Admitting: Nurse Practitioner

## 2022-09-10 ENCOUNTER — Encounter: Payer: Self-pay | Admitting: Nurse Practitioner

## 2022-09-10 VITALS — BP 144/86 | HR 85 | Temp 97.2°F | Resp 18 | Wt 140.4 lb

## 2022-09-10 DIAGNOSIS — R5381 Other malaise: Secondary | ICD-10-CM

## 2022-09-10 DIAGNOSIS — R0602 Shortness of breath: Secondary | ICD-10-CM

## 2022-09-10 DIAGNOSIS — I5032 Chronic diastolic (congestive) heart failure: Secondary | ICD-10-CM

## 2022-09-10 DIAGNOSIS — R630 Anorexia: Secondary | ICD-10-CM

## 2022-09-10 DIAGNOSIS — Z515 Encounter for palliative care: Secondary | ICD-10-CM

## 2022-09-10 NOTE — Progress Notes (Signed)
[place] Consult Note Telephone: [phone]  Fax: [phone]    Date of encounter: 09/10/22 11:24 AM PATIENT NAME: Kelliher [id number]   [phone] (home)  DOB: [birth year] MRN: [id number] PRIMARY CARE PROVIDER:    [place]:    Contact Information     Name Relation Home Work Mobile   [place] Son [phone]  [phone]     I met face to face with patient in facility. Palliative Care was asked to follow this patient by consultation request of  [place] to address advance care planning and complex medical decision making. This is a follow up visit.                                  ASSESSMENT AND PLAN / RECOMMENDATIONS:  Symptom Management/Plan: 1. Advance Care Planning;  DNR; Re-visited hospice with [name], Ms. Ingalls's son, will think about it through the weekend and revisit first part of next week. Discussed hospice services, support provided, questions answered. 2. Anorexia with weight loss, ongoing continue to monitor weights, supplement, encourage Ms. Degroff to eat, supportive 12/09/2021 weight 169.7 lbs 02/03/2022 weight 147.5 lbs 02/15/2022 weight 148 lbs 04/15/2022 weight 141.3 lbs 05/10/2022 weight 142.5 lbs 05/17/2022 weight 137 lbs 06/10/2022 weight 139.8 lbs 07/15/2022 weight 137 lbs 08/11/2022 weight 140.4 lbs 3.4 lb weight gain 3. Debility with shortness of breath ongoing secondary to CHF, COPD, continue encourage mobility, fall risk, monitor respiratory status; chronic disease progression   4.  Palliative care encounter; Palliative care encounter; Palliative medicine team will continue to support patient, patient's family, and medical team. Visit consisted of counseling and education dealing with the complex and emotionally intense issues of symptom management and palliative care in the setting of serious and potentially  life-threatening illness   Follow up Palliative Care Visit: Palliative care will continue to follow for complex medical decision making, advance care planning, and clarification of goals. Return 1 weeks or prn.   I spent 36 minutes providing this consultation started at 10:50 am. More than 50% of the time in this consultation was spent in counseling and care coordination. PPS: 40% Chief Complaint: Follow up palliative consult for complex medical decision making   HISTORY OF PRESENT ILLNESS:  Lalaine Laruen Risser is a 88 y.o. year old female  with multiple medical problems including dementia, chronic diastolic CHF, LVH, bradycardia, persistent a-fib, HTN, type 2 diabetes, HLD, bilateral leg edema, anemia, anxiety,COPD, CKD, GERD, DM, HLD, obesity, depression. Ms. Buccellato resides at [place] at [facility]. Ms. Jacobson requires assistance with mobility, transfers, adl's including bathing, dressing. Ms. Gales feeds herself, decreased appetite, significant weight loss.  Ms. Spicer is O2 dependent. Staff endorses continues to overall decline, debility. At present Ms. Girvan is sitting in the w/c in her room, appears elderly, debilitated, chronically ill. Ms. Pe appears comfortable. Ms. Strack asked provider to open a box of crackers for her. Ms. Ditullio and I talked about how she has been feeling today. Ms Bancroft endorses today she has been feeling better than last few days. Ms. Judice endorses she is waiting for breakfast, though it had already been served, she did not recall. Limited discussion due to cognitive impairment. Ms. Vondra is able to answer, simple questions, recall is limited. Ms. Tetro is interactive, engaging, no sob with speaking. Ms. Hulce denies pain or sob. ROS discussed.  We talked about her appetite, weight gain, edema to BLE with dressings/wraps. Ms. Chancellor endorses her legs chronically hurt. Encouraged Ms. Strehle to elevate her legs when she is able too. Ms. Turnbaugh talked about foods she likes to eat. Ms. Thursby talked  for some time about the cheese crackers her son brought to her yesterday. Ms. Vinzant then continued to talk about her son [name]. We talked about daily routine, things that she likes to do, though again limited with difficulty with recall. Most pc visit supportive, medical goals, poc, medications reviewed. Ms. Reif endorses she was very glad for pc visit, she enjoyed the company. I attempted to contact [name], Ms. Bruhn son, unable to reach. I updated staff, will continue on pc, supportive role. Therapeutic listening, emotional support provided,    History obtained from review of EMR, discussion with primary team, and interview with family, facility staff/caregiver and/or [name].  I reviewed available labs, medications, imaging, studies and related documents from the EMR.  Records reviewed and summarized above.    History obtained from review of EMR, discussion facility staff and [name].  I reviewed available labs, medications, imaging, studies and related documents from the EMR.  Records reviewed and summarized above.    ROS 10 point system reviewed all negative except HPI   Physical Exam: Constitutional: NAD General: frail appearing, chronically ill, pleasant female EYES:  lids intact ENMT: oral mucous membranes moist, CV: S1S2, RRR Pulmonary: decrease throughout; O2 MSK: w/c dependent Skin: warm and dry Neuro:  + generalized weakness,  + cognitive impairment Psych: non-anxious affect, Alert, appears tired, forgetfulness Thank you for the opportunity to participate in the care of [name].  The palliative care team will continue to follow. Please call our office at [phone] if we can be of additional assistance.   Hatsuko Bizzarro Ihor Gully, NP

## 2022-09-14 ENCOUNTER — Ambulatory Visit (INDEPENDENT_AMBULATORY_CARE_PROVIDER_SITE_OTHER): Payer: Medicare Other | Admitting: Vascular Surgery

## 2022-09-19 NOTE — Progress Notes (Unsigned)
Patient ID: [name], female    DOB: August 23, 1932, 88 y.o.   MRN: [id number]  [name] is a 88 y/o female with a history of DM, hyperlipidemia, HTN, depression, anxiety, GERD, COPD and chronic heart failure.   Echo report from 11/11/21 reviewed and showed an EF of 60-65% along with mild LVH. Echo report from 10/29/20 reviewed and showed an EF of 60-65% along with mild MR.   Has not been admitted or been in the ED in the last 6 months.   She presents today for a follow-up visit with a chief complaint of moderate fatigue with minimal exertion. Describes this as chronic in nature. She has associated shortness of breath, easy bruising, anxiety, difficulty sleeping, pedal edema and pain around both ankles. She denies any abdominal distention, palpitations, chest pain, cough or dizziness. Currently wearing oxygen at 3L around the clock.   Her biggest complaint is of pain around both of her ankles and of wounds that currently have bandages over them.   Past Medical History:  Diagnosis Date   Anxiety    CHF (congestive heart failure) (HCC)    COPD (chronic obstructive pulmonary disease) (HCC)    Dementia (HCC)    Depression    Diabetes mellitus without complication (HCC)    GERD (gastroesophageal reflux disease)    Hip fracture (HCC)    Hyperlipemia    Hypertension    Neuropathy    Vertigo    Past Surgical History:  Procedure Laterality Date   ABDOMINAL HYSTERECTOMY     APPENDECTOMY     BACK SURGERY     tumor removal bengin   CHOLECYSTECTOMY     INTRAMEDULLARY (IM) NAIL INTERTROCHANTERIC Left 04/30/2021   Procedure: INTRAMEDULLARY (IM) NAIL INTERTROCHANTRIC;  Surgeon: Signa Kell, MD;  Location: [facility] ORS;  Service: Orthopedics;  Laterality: Left;   Family History  Problem Relation Age of Onset   Diabetes Mother    Diabetes Sister        x2   Breast cancer Sister    Heart disease Father    Heart disease Brother        x3   Bladder Cancer Neg Hx    Kidney cancer Neg Hx     Prostate cancer Neg Hx    Social History   Tobacco Use   Smoking status: Never   Smokeless tobacco: Never  Substance Use Topics   Alcohol use: No    Alcohol/week: 0.0 standard drinks of alcohol   Allergies  Allergen Reactions   Ampicillin     Zpac   Azithromycin     Other reaction(s): Unknown   Sulfa Antibiotics    Prior to Admission medications   Medication Sig Start Date End Date Taking? Authorizing Provider  acetaminophen (TYLENOL) 500 MG tablet Take 1,000 mg by mouth 3 (three) times daily.   Yes [provider]  acidophilus (RISAQUAD) CAPS capsule Take 1 capsule by mouth 2 (two) times daily.   Yes [provider]  apixaban (ELIQUIS) 5 MG TABS tablet Take 1 tablet (5 mg total) by mouth 2 (two) times daily. 11/12/21  Yes Wouk, Wilfred Curtis, MD  azelastine (ASTELIN) 0.1 % nasal spray Place 1 spray into both nostrils 2 (two) times daily. Use in each nostril as directed   Yes [provider]  busPIRone (BUSPAR) 7.5 MG tablet Take 10 mg by mouth 3 (three) times daily.   Yes [provider]  divalproex (DEPAKOTE ER) 250 MG 24 hr tablet Take 250 mg by  mouth at bedtime.   Yes [provider]  furosemide (LASIX) 20 MG tablet Take 60 mg by mouth daily.   Yes [provider]  ipratropium-albuterol (DUONEB) 0.5-2.5 (3) MG/3ML SOLN Take 3 mLs by nebulization 2 (two) times daily. 08/23/18  Yes Wieting, Richard, MD  lisinopril (ZESTRIL) 20 MG tablet Take 20 mg by mouth daily.   Yes [provider]  loperamide (IMODIUM A-D) 2 MG tablet Take 2 mg by mouth as needed for diarrhea or loose stools (up to 4 doses in 12 hrs.).   Yes [provider]  metoprolol tartrate (LOPRESSOR) 25 MG tablet Take 2 tablets (50 mg total) by mouth 2 (two) times daily. 11/12/21  Yes Wouk, Wilfred Curtis, MD  Morphine Sulfate (MORPHINE CONCENTRATE) 10 mg / 0.5 ml concentrated solution Take 5 mg by mouth every 4 (four) hours as needed for severe pain or  shortness of breath.   Yes [provider]  olopatadine (PATANOL) 0.1 % ophthalmic solution Place 1 drop into both eyes daily.   Yes [provider]  OXYGEN Inhale 3 L/min into the lungs.   Yes [provider]  polyethylene glycol (MIRALAX / GLYCOLAX) 17 g packet Take 17 g by mouth every other day.   Yes [provider]  potassium chloride (KLOR-CON) 10 MEQ tablet Take 10 mEq by mouth daily.   Yes [provider]  sertraline (ZOLOFT) 50 MG tablet Take 50 mg by mouth daily. 10/23/20  Yes [provider]  traMADol (ULTRAM) 50 MG tablet Take 1 tablet (50 mg total) by mouth every 6 (six) hours as needed for moderate pain. 05/01/21  Yes Dedra Skeens, PA-C  traZODone (DESYREL) 50 MG tablet Take 75 mg by mouth at bedtime. 08/26/21  Yes [provider]  ondansetron (ZOFRAN) 4 MG tablet Take 4 mg by mouth every 8 (eight) hours as needed for nausea or vomiting. Patient not taking: Reported on 03/17/2022    [provider]  Pseudoephedrine-DM-GG (ROBITUSSIN CF PO) Take 10 mLs by mouth every 6 (six) hours as needed (cough). Patient not taking: Reported on 03/17/2022    [provider]  psyllium (METAMUCIL) 58.6 % powder Take 1 packet by mouth 2 (two) times a day. Patient not taking: Reported on 03/17/2022    [provider]  senna-docusate (SENOKOT-S) 8.6-50 MG tablet Take 1 tablet by mouth 2 (two) times daily. Patient not taking: Reported on 03/17/2022 05/04/21   Kathrynn Running, MD  sodium phosphate Pediatric (FLEET) 3.5-9.5 GM/59ML enema Place 1 enema rectally once as needed for severe constipation. Patient not taking: Reported on 03/17/2022    [provider]   Review of Systems  Constitutional:  Positive for fatigue. Negative for appetite change.  HENT:  Positive for rhinorrhea. Negative for congestion and sore throat.   Eyes: Negative.   Respiratory:  Positive for shortness of breath. Negative for cough.    Cardiovascular:  Positive for leg swelling. Negative for chest pain and palpitations.  Gastrointestinal:  Positive for vomiting (after eating). Negative for abdominal distention and abdominal pain.  Endocrine: Negative.   Genitourinary: Negative.   Musculoskeletal:  Positive for arthralgias (left lower leg/ knee; both ankles). Negative for back pain.  Skin:  Positive for wound.  Allergic/Immunologic: Negative.   Neurological:  Negative for dizziness and light-headedness.  Hematological:  Negative for adenopathy. Bruises/bleeds easily.  Psychiatric/Behavioral:  Positive for sleep disturbance (trouble falling asleep; sleeping on 1 pillow). Negative for dysphoric mood. The patient is nervous/anxious.  Vitals:   09/20/22 1039  BP: 126/62  Pulse: 72  Resp: 16  SpO2: 100%   Wt Readings from Last 3 Encounters:  09/10/22 140 lb 6.4 oz (63.7 kg)  08/27/22 140 lb 6.4 oz (63.7 kg)  08/13/22 140 lb 6.4 oz (63.7 kg)   Lab Results  Component Value Date   CREATININE 0.72 11/12/2021   CREATININE 0.78 11/11/2021   CREATININE 0.89 11/10/2021   Physical Exam Vitals and nursing note reviewed. Exam conducted with a chaperone present (facility caregiver).  Constitutional:      Appearance: Normal appearance.  HENT:     Head: Normocephalic and atraumatic.  Cardiovascular:     Rate and Rhythm: Normal rate and regular rhythm.  Pulmonary:     Effort: Pulmonary effort is normal. No respiratory distress.     Breath sounds: No wheezing or rales.  Abdominal:     General: There is no distension.     Palpations: Abdomen is soft.  Musculoskeletal:        General: No tenderness.     Cervical back: Normal range of motion and neck supple.     Right lower leg: Edema (trace pitting; weeping wound anterior shin with wet bandage on it) present.     Left lower leg: Edema (1+ pitting; weeping wound anterior shin with wet bandage on it) present.  Skin:    General: Skin is warm and dry.     Findings:  Erythema (left shin) present.     Comments: Odor noted from leg wounds  Neurological:     General: No focal deficit present.     Mental Status: She is alert and oriented to person, place, and time.  Psychiatric:        Mood and Affect: Mood normal.        Behavior: Behavior normal.    Assessment & Plan:  1: Chronic heart failure with preserved ejection fraction with structural changes (LVH)- - NYHA class III - euvolemic today - currently being weighed "almost daily"; facility to call for an overnight weight gain of > 2 pounds or a weekly weight gain of > 5 pounds - not adding salt  - saw cardiology Mariana Kaufman) 12/22/21 - on GDMT of lisinopril - palliative care visit done 09/10/22; discussion had about hospice - wearing oxygen at 3L around the clock - BNP 11/10/21 was 532.8  2: HTN- - BP looks good (126/62) - seeing PCP at [facility]) - BMP 11/12/21 reviewed and showed sodium 138, potassium 4.2, creatinine 0.72 and GFR >60  3: DM-   - A1c 10/29/20 was 6.4%  4: Lymphedema- - stage 2 - unable to exercise due to symptoms - can't wear compression socks due to weeping wounds - odor noted from wounds - order written for wound nurse at facility to assess wounds and change the wet bandages   Facility medication list reviewed.   Due to HF stability, will not make a return appointment for patient at this time. Advised patient and caregiver that they could call back at anytime for questions, issues or to make an appointment and they were comfortable with this plan.

## 2022-09-20 ENCOUNTER — Ambulatory Visit: Payer: Medicare Other | Attending: Family | Admitting: Family

## 2022-09-20 ENCOUNTER — Encounter: Payer: Self-pay | Admitting: Family

## 2022-09-20 VITALS — BP 126/62 | HR 72 | Resp 16

## 2022-09-20 DIAGNOSIS — F0393 Unspecified dementia, unspecified severity, with mood disturbance: Secondary | ICD-10-CM | POA: Insufficient documentation

## 2022-09-20 DIAGNOSIS — F32A Depression, unspecified: Secondary | ICD-10-CM | POA: Insufficient documentation

## 2022-09-20 DIAGNOSIS — E119 Type 2 diabetes mellitus without complications: Secondary | ICD-10-CM | POA: Insufficient documentation

## 2022-09-20 DIAGNOSIS — F0394 Unspecified dementia, unspecified severity, with anxiety: Secondary | ICD-10-CM | POA: Diagnosis not present

## 2022-09-20 DIAGNOSIS — J449 Chronic obstructive pulmonary disease, unspecified: Secondary | ICD-10-CM | POA: Insufficient documentation

## 2022-09-20 DIAGNOSIS — K219 Gastro-esophageal reflux disease without esophagitis: Secondary | ICD-10-CM | POA: Insufficient documentation

## 2022-09-20 DIAGNOSIS — I890 Lymphedema, not elsewhere classified: Secondary | ICD-10-CM | POA: Insufficient documentation

## 2022-09-20 DIAGNOSIS — I10 Essential (primary) hypertension: Secondary | ICD-10-CM | POA: Diagnosis not present

## 2022-09-20 DIAGNOSIS — I5032 Chronic diastolic (congestive) heart failure: Secondary | ICD-10-CM | POA: Diagnosis present

## 2022-09-20 DIAGNOSIS — E785 Hyperlipidemia, unspecified: Secondary | ICD-10-CM | POA: Diagnosis not present

## 2022-09-20 DIAGNOSIS — I110 Hypertensive heart disease with heart failure: Secondary | ICD-10-CM | POA: Diagnosis present

## 2022-09-20 NOTE — Patient Instructions (Addendum)
Continue weighing daily and call for an overnight weight gain of 3 pounds or more or a weekly weight gain of more than 5 pounds.   Return as needed

## 2022-09-28 ENCOUNTER — Ambulatory Visit (INDEPENDENT_AMBULATORY_CARE_PROVIDER_SITE_OTHER): Payer: Medicare Other | Admitting: Vascular Surgery

## 2022-10-08 DEATH — deceased
